# Patient Record
Sex: Male | Born: 1955
Health system: Southern US, Community
[De-identification: ages and names within clinical notes are randomized; demographics above are authoritative.]

## PROBLEM LIST (undated history)

## (undated) DIAGNOSIS — C4491 Basal cell carcinoma of skin, unspecified: Secondary | ICD-10-CM

## (undated) DIAGNOSIS — K2970 Gastritis, unspecified, without bleeding: Secondary | ICD-10-CM

## (undated) DIAGNOSIS — Z933 Colostomy status: Secondary | ICD-10-CM

## (undated) DIAGNOSIS — N200 Calculus of kidney: Secondary | ICD-10-CM

## (undated) DIAGNOSIS — D649 Anemia, unspecified: Secondary | ICD-10-CM

## (undated) DIAGNOSIS — A048 Other specified bacterial intestinal infections: Secondary | ICD-10-CM

## (undated) DIAGNOSIS — I10 Essential (primary) hypertension: Secondary | ICD-10-CM

## (undated) DIAGNOSIS — F410 Panic disorder [episodic paroxysmal anxiety] without agoraphobia: Secondary | ICD-10-CM

## (undated) DIAGNOSIS — T7840XA Allergy, unspecified, initial encounter: Secondary | ICD-10-CM

## (undated) DIAGNOSIS — E785 Hyperlipidemia, unspecified: Secondary | ICD-10-CM

## (undated) DIAGNOSIS — Z86010 Personal history of colonic polyps: Secondary | ICD-10-CM

## (undated) DIAGNOSIS — C20 Malignant neoplasm of rectum: Secondary | ICD-10-CM

## (undated) DIAGNOSIS — C189 Malignant neoplasm of colon, unspecified: Secondary | ICD-10-CM

## (undated) HISTORY — DX: Basal cell carcinoma of skin, unspecified: C44.91

## (undated) HISTORY — DX: Gastritis, unspecified, without bleeding: K29.70

## (undated) HISTORY — DX: Hyperlipidemia, unspecified: E78.5

## (undated) HISTORY — DX: Panic disorder (episodic paroxysmal anxiety): F41.0

## (undated) HISTORY — DX: Calculus of kidney: N20.0

## (undated) HISTORY — DX: Malignant neoplasm of rectum: C20

## (undated) HISTORY — DX: Personal history of colonic polyps: Z86.010

## (undated) HISTORY — PX: TONSILLECTOMY: SUR1361

## (undated) HISTORY — DX: Malignant neoplasm of colon, unspecified: C18.9

## (undated) HISTORY — DX: Essential (primary) hypertension: I10

## (undated) HISTORY — DX: Other specified bacterial intestinal infections: A04.8

## (undated) HISTORY — DX: Anemia, unspecified: D64.9

---

## 2013-08-07 ENCOUNTER — Encounter (INDEPENDENT_AMBULATORY_CARE_PROVIDER_SITE_OTHER): Payer: Self-pay

## 2013-08-07 ENCOUNTER — Ambulatory Visit (INDEPENDENT_AMBULATORY_CARE_PROVIDER_SITE_OTHER): Payer: BC Managed Care – PPO

## 2013-08-07 ENCOUNTER — Ambulatory Visit (INDEPENDENT_AMBULATORY_CARE_PROVIDER_SITE_OTHER): Payer: BC Managed Care – PPO | Admitting: Nurse Practitioner

## 2013-08-07 VITALS — BP 155/97 | HR 77 | Temp 99.2°F | Ht 73.0 in | Wt 200.0 lb

## 2013-08-07 DIAGNOSIS — K5900 Constipation, unspecified: Secondary | ICD-10-CM

## 2013-08-07 DIAGNOSIS — R109 Unspecified abdominal pain: Secondary | ICD-10-CM

## 2013-08-07 DIAGNOSIS — A048 Other specified bacterial intestinal infections: Secondary | ICD-10-CM

## 2013-08-07 HISTORY — DX: Other specified bacterial intestinal infections: A04.8

## 2013-08-07 NOTE — Progress Notes (Signed)
   Subjective:    Patient ID: Austin Robbins, male    DOB: 11-29-1955, 56 y.o.   MRN: [id number]  HPI Patient in c/o abdominal pain- started 1 month ago- decreased appetite- lower back pain- then started getting gassy stomach growling- has to force hisself to eat- got some better for couple of days then started having bloody stools- no black stools- stools contain mucus.    Review of Systems  Constitutional: Positive for appetite change (decrased).  Cardiovascular: Negative.   Gastrointestinal: Positive for abdominal pain and blood in stool. Negative for nausea, diarrhea and constipation.  Genitourinary: Negative.   Hematological: Negative.        Objective:   Physical Exam  Constitutional: He appears well-developed and well-nourished.  Cardiovascular: Normal rate, regular rhythm and normal heart sounds.   Pulmonary/Chest: Effort normal and breath sounds normal.  Abdominal: Soft. Bowel sounds are normal. He exhibits distension. He exhibits no mass. There is no tenderness. There is no rebound and no guarding.     BP 155/97  Pulse 77  Temp(Src) 99.2 F (37.3 C)  Ht 6\' 1"  (1.854 m)  Wt 200 lb (90.719 kg)  BMI 26.39 kg/m2 KUB- large amount of stool in colon- Preliminary reading by Paulene Floor, FNP  [facility]      Assessment & Plan:   1. Abdominal pain of unknown etiology   2. Constipation    MOM an prune juice every 4 hours Increase fibr in diet Labs pending Will talk when all tet results are back  Mary-Margaret Daphine Deutscher, FNP

## 2013-08-08 LAB — CBC WITH DIFFERENTIAL
Basophils Absolute: 0 10*3/uL (ref 0.0–0.2)
Eos: 4 %
HCT: 37 % — ABNORMAL LOW (ref 37.5–51.0)
Immature Granulocytes: 0 %
Lymphs: 23 %
Monocytes: 9 %
Neutrophils Absolute: 7 10*3/uL (ref 1.4–7.0)
Neutrophils Relative %: 64 %
Platelets: 374 10*3/uL (ref 150–379)
RBC: 4.91 x10E6/uL (ref 4.14–5.80)
RDW: 16.3 % — ABNORMAL HIGH (ref 12.3–15.4)
WBC: 10.9 10*3/uL — ABNORMAL HIGH (ref 3.4–10.8)

## 2013-08-09 ENCOUNTER — Other Ambulatory Visit: Payer: Self-pay | Admitting: Nurse Practitioner

## 2013-08-09 LAB — CMP14+EGFR
ALT: 11 IU/L (ref 0–44)
AST: 13 IU/L (ref 0–40)
Albumin/Globulin Ratio: 1.8 (ref 1.1–2.5)
Albumin: 4.3 g/dL (ref 3.5–5.5)
Alkaline Phosphatase: 88 IU/L (ref 39–117)
BUN/Creatinine Ratio: 15 (ref 9–20)
BUN: 14 mg/dL (ref 6–24)
CO2: 24 mmol/L (ref 18–29)
Creatinine, Ser: 0.92 mg/dL (ref 0.76–1.27)
GFR calc Af Amer: 106 mL/min/{1.73_m2} (ref >59)
GFR calc non Af Amer: 92 mL/min/{1.73_m2} (ref >59)
Globulin, Total: 2.4 g/dL (ref 1.5–4.5)
Sodium: 141 mmol/L (ref 134–144)

## 2013-08-09 LAB — LIPASE: Lipase: 41 U/L (ref 0–59)

## 2013-08-09 LAB — H PYLORI, IGM, IGG, IGA AB
H. pylori, IgA Abs: <9 units (ref 0.0–8.9)
H. pylori, IgM Abs: 11.7 units — ABNORMAL HIGH (ref 0.0–8.9)

## 2013-08-09 MED ORDER — AMOXICILL-CLARITHRO-LANSOPRAZ PO MISC: Freq: Two times a day (BID) | ORAL | Status: DC

## 2013-08-11 ENCOUNTER — Telehealth: Payer: Self-pay | Admitting: Nurse Practitioner

## 2013-08-11 DIAGNOSIS — D649 Anemia, unspecified: Secondary | ICD-10-CM

## 2013-08-11 DIAGNOSIS — A048 Other specified bacterial intestinal infections: Secondary | ICD-10-CM

## 2013-08-15 NOTE — Telephone Encounter (Signed)
Message copied by Gwenith Daily on Tue Aug 15, 2013  3:26 PM ------      Message from: Bennie Pierini      Created: Wed Aug 09, 2013  2:04 PM       h pylori- needs treatment- rx called into pharmacy (bacteria in stomach)      hgb low- OTC iron tablets- recheck in 1 month       ------

## 2013-08-15 NOTE — Telephone Encounter (Signed)
Discussed results of xray and labs. Patient reports pink tinged mucus with BMs at times. No black or tarry stools. I will leave a FOBT up front for him to do. He has never had a colonoscopy. Will put in a referral for GI consult and colonoscopy. Patient is ok with this. He will pickup medication to treat H Pylori and will take and OTC iron supplement. He is aware that he should f/u in 1 month for rpt hgb.

## 2013-09-27 ENCOUNTER — Encounter: Payer: Self-pay | Admitting: Family Medicine

## 2013-09-29 ENCOUNTER — Telehealth: Payer: Self-pay

## 2013-09-29 DIAGNOSIS — B9681 Helicobacter pylori [H. pylori] as the cause of diseases classified elsewhere: Secondary | ICD-10-CM

## 2013-09-29 DIAGNOSIS — K2970 Gastritis, unspecified, without bleeding: Principal | ICD-10-CM

## 2013-09-29 NOTE — Telephone Encounter (Signed)
Referral re-done.

## 2013-09-29 NOTE — Addendum Note (Signed)
Addended by: Chevis Pretty on: 09/29/2013 12:18 PM   Modules accepted: Level of Service

## 2013-10-02 ENCOUNTER — Telehealth: Payer: Self-pay

## 2013-10-02 ENCOUNTER — Telehealth: Payer: Self-pay | Admitting: Nurse Practitioner

## 2013-10-02 MED ORDER — TRAMADOL HCL 50 MG PO TABS
50.0000 mg | ORAL_TABLET | Freq: Three times a day (TID) | ORAL | Status: DC | PRN
Start: 2013-10-02 — End: 2013-11-01

## 2013-10-02 NOTE — Telephone Encounter (Signed)
Still really hurting   Need some medicine

## 2013-10-02 NOTE — Telephone Encounter (Signed)
I spoke with patient and he stated he did have 107 fever. He was positive his fever was 107 earlier today and was 98.0 now orally. Stated he came to [facility] earlier today to pickup a RX. Pt. Having chills as well. No abd pain.. Pt stated he had loose stools and had been waiting for referral to GI. Advised pt 2 GI referrals had been place previously. I placed pt on hold and spoke with MMM about patient. Per MMM go to ER now. Pt stated he didn't wont to go to ER. Pt was given appt for tom am with MMM but was advised again to go to ER because his temperature was to high and dangerous to wait for am apt.. This was witnessed by Ronnald Collum and Lynnea Ferrier. Pt refused to go to ER. Will rck pt in am in office.

## 2013-10-02 NOTE — Telephone Encounter (Signed)
Yes was done 09/29/13- i will have it checked on

## 2013-10-02 NOTE — Telephone Encounter (Signed)
Pt notified that RX was at front ready for pick up per MMM note

## 2013-10-02 NOTE — Telephone Encounter (Signed)
Austin Robbins is working on referral but patient said in a lot of pain and wants some medicine

## 2013-10-02 NOTE — Telephone Encounter (Signed)
Pain rx ready for pick up  

## 2013-10-03 ENCOUNTER — Ambulatory Visit (INDEPENDENT_AMBULATORY_CARE_PROVIDER_SITE_OTHER): Payer: 59 | Admitting: Nurse Practitioner

## 2013-10-03 ENCOUNTER — Telehealth: Payer: Self-pay | Admitting: Internal Medicine

## 2013-10-03 ENCOUNTER — Encounter: Payer: Self-pay | Admitting: Nurse Practitioner

## 2013-10-03 VITALS — BP 91/64 | HR 81 | Temp 97.9°F | Ht 73.0 in | Wt 215.0 lb

## 2013-10-03 DIAGNOSIS — R197 Diarrhea, unspecified: Secondary | ICD-10-CM

## 2013-10-03 DIAGNOSIS — R509 Fever, unspecified: Secondary | ICD-10-CM

## 2013-10-03 DIAGNOSIS — N410 Acute prostatitis: Secondary | ICD-10-CM

## 2013-10-03 DIAGNOSIS — F410 Panic disorder [episodic paroxysmal anxiety] without agoraphobia: Secondary | ICD-10-CM

## 2013-10-03 LAB — POCT URINALYSIS DIPSTICK
Bilirubin, UA: NEGATIVE
Blood, UA: NEGATIVE
Glucose, UA: NEGATIVE
KETONES UA: NEGATIVE
LEUKOCYTES UA: NEGATIVE
NITRITE UA: NEGATIVE
PH UA: 5
Spec Grav, UA: 1.025
UROBILINOGEN UA: NEGATIVE

## 2013-10-03 LAB — POCT CBC
Granulocyte percent: 0.8 %G — AB (ref 37–80)
HCT, POC: 31.9 % — AB (ref 43.5–53.7)
Hemoglobin: 9.9 g/dL — AB (ref 14.1–18.1)
LYMPH, POC: 0.8 (ref 0.6–3.4)
MCH, POC: 22.3 pg — AB (ref 27–31.2)
MCHC: 31 g/dL — AB (ref 31.8–35.4)
MCV: 72 fL — AB (ref 80–97)
MPV: 8.2 fL (ref 0–99.8)
POC GRANULOCYTE: 13.3 — AB (ref 2–6.9)
POC LYMPH %: 5.2 % — AB (ref 10–50)
Platelet Count, POC: 227 10*3/uL (ref 142–424)
RBC: 4.4 M/uL — AB (ref 4.69–6.13)
RDW, POC: 17.3 %
WBC: 14.5 10*3/uL — AB (ref 4.6–10.2)

## 2013-10-03 LAB — POCT UA - MICROSCOPIC ONLY
Bacteria, U Microscopic: NEGATIVE
CASTS, UR, LPF, POC: NEGATIVE
CRYSTALS, UR, HPF, POC: NEGATIVE
Epithelial cells, urine per micros: NEGATIVE
RBC, urine, microscopic: NEGATIVE
YEAST UA: NEGATIVE

## 2013-10-03 MED ORDER — ALPRAZOLAM 0.25 MG PO TABS: 0.2500 mg | ORAL_TABLET | Freq: Two times a day (BID) | ORAL | Status: DC | PRN

## 2013-10-03 MED ORDER — SULFAMETHOXAZOLE-TMP DS 800-160 MG PO TABS: 1.0000 | ORAL_TABLET | Freq: Two times a day (BID) | ORAL | Status: DC

## 2013-10-03 NOTE — Progress Notes (Signed)
Subjective:    Patient ID: Austin Robbins, male    DOB: 1955/10/18, 58 y.o.   MRN: [id number]  HPI  Patient in today saying that he is still having abdominal pain. Has spells that come on all the suddenly- says he shakes real bad and becomes SOB but no addominal pain. Happens intermittently but occurred 2X yesterday and lasts about 30 minutes says taht his temperature has been fluctuating. He was seen earlier in December with diarrhea and abdominal pain and was diagnosed Hpylori an dwas treated-. Since then his stools look like dark water or pudding and he says that they float. * not voiding as much as he usually does and occasionally has trouble getting stream started.   Review of Systems  Constitutional: Positive for fever (?). Negative for chills and appetite change.  Respiratory: Negative.   Cardiovascular: Negative.   Gastrointestinal: Positive for diarrhea.  Genitourinary: Negative.   Neurological: Negative.   All other systems reviewed and are negative.       Objective:   Physical Exam  Constitutional: He is oriented to person, place, and time. He appears well-developed and well-nourished.  Neck: Normal range of motion. Neck supple.  Cardiovascular: Normal rate, regular rhythm and normal heart sounds.   Pulmonary/Chest: Effort normal and breath sounds normal.  Abdominal: Soft. Bowel sounds are normal. He exhibits no distension. There is no tenderness. There is no rebound.  Genitourinary:  No rectal exam today  Lymphadenopathy:    He has no cervical adenopathy.  Neurological: He is alert and oriented to person, place, and time.  Skin: Skin is warm.  Psychiatric: He has a normal mood and affect. His behavior is normal. Judgment and thought content normal.   BP 91/64  Pulse 81  Temp(Src) 97.9 F (36.6 C) (Oral)  Ht 6\' 1"  (1.854 m)  Wt 215 lb (97.523 kg)  BMI 28.37 kg/m2  Results for orders placed in visit on 10/03/13  POCT CBC      Result Value Range   WBC 14.5  (*) 4.6 - 10.2 K/uL   Lymph, poc 0.8  0.6 - 3.4   POC LYMPH PERCENT 5.2 (*) 10 - 50 %L   MID (cbc)    0 - 0.9   POC MID %    0 - 12 %M   POC Granulocyte 13.3 (*) 2 - 6.9   Granulocyte percent 0.8 (*) 37 - 80 %G   RBC 4.4 (*) 4.69 - 6.13 M/uL   Hemoglobin 9.9 (*) 14.1 - 18.1 g/dL   HCT, POC 31.9 (*) 43.5 - 53.7 %   MCV 72.0 (*) 80 - 97 fL   MCH, POC 22.3 (*) 27 - 31.2 pg   MCHC 31.0 (*) 31.8 - 35.4 g/dL   RDW, POC 17.3     Platelet Count, POC 227.0  142 - 424 K/uL   MPV 8.2  0 - 99.8 fL  POCT UA - MICROSCOPIC ONLY      Result Value Range   WBC, Ur, HPF, POC 5-10     RBC, urine, microscopic neg     Bacteria, U Microscopic neg     Mucus, UA occ     Epithelial cells, urine per micros neg w/ sperm present     Crystals, Ur, HPF, POC neg     Casts, Ur, LPF, POC neg     Yeast, UA neg         Assessment & Plan:   1. Diarrhea   2. Panic attacks  3. Fever, unspecified   4. Prostatitis, acute    Meds ordered this encounter  Medications  . ALPRAZolam (XANAX) 0.25 MG tablet    Sig: Take 1 tablet (0.25 mg total) by mouth 2 (two) times daily as needed for anxiety.    Dispense:  60 tablet    Refill:  0    Order Specific Question:  Supervising Provider    Answer:  Chipper Herb [[id number]]  . sulfamethoxazole-trimethoprim (BACTRIM DS) 800-160 MG per tablet    Sig: Take 1 tablet by mouth 2 (two) times daily.    Dispense:  60 tablet    Refill:  0    Order Specific Question:  Supervising Provider    Answer:  Joycelyn Man   Gi referral pending Force fluids Rest  Make sure complete antibiotic as rx RTO prn  Mary-Margaret Hassell Done, FNP

## 2013-10-03 NOTE — Patient Instructions (Addendum)
Panic Attacks Panic attacks are sudden, short-livedsurges of severe anxiety, fear, or discomfort. They may occur for no reason when you are relaxed, when you are anxious, or when you are sleeping. Panic attacks may occur for a number of reasons:   Healthy people occasionally have panic attacks in extreme, life-threatening situations, such as war or natural disasters. Normal anxiety is a protective mechanism of the body that helps [place] react to danger (fight or flight response).  Panic attacks are often seen with anxiety disorders, such as panic disorder, social anxiety disorder, generalized anxiety disorder, and phobias. Anxiety disorders cause excessive or uncontrollable anxiety. They may interfere with your relationships or other life activities.  Panic attacks are sometimes seen with other mental illnesses such as depression and posttraumatic stress disorder.  Certain medical conditions, prescription medicines, and drugs of abuse can cause panic attacks. SYMPTOMS  Panic attacks start suddenly, peak within 20 minutes, and are accompanied by four or more of the following symptoms:  Pounding heart or fast heart rate (palpitations).  Sweating.  Trembling or shaking.  Shortness of breath or feeling smothered.  Feeling choked.  Chest pain or discomfort.  Nausea or strange feeling in your stomach.  Dizziness, lightheadedness, or feeling like you will faint.  Chills or hot flushes.  Numbness or tingling in your lips or hands and feet. Prostatitis The prostate gland is about the size and shape of a walnut. It is located just below your bladder. It produces one of the components of semen, which is made up of sperm and the fluids that help nourish and transport it out from the testicles. Prostatitis is inflammation of the prostate gland.  There are four types of prostatitis: Acute bacterial prostatitis This is the least common type of prostatitis. It starts quickly and usually is associated  with a bladder infection, high fever, and shaking chills. It can occur at any age. Chronic bacterial prostatitis This is a persistent bacterial infection in the prostate. It usually develops from repeated acute bacterial prostatitis or acute bacterial prostatitis that was not properly treated. It can occur in men of any age but is most common in middle-aged men whose prostate has begun to enlarge. The symptoms are not as severe as those in acute bacterial prostatitis. Discomfort in the part of your body that is in front of your rectum and below your scrotum (perineum), lower abdomen, or in the head of your penis (glans) may represent your primary discomfort. Chronic prostatitis (nonbacterial) This is the most common type of prostatitis. It is inflammation of the prostate gland that is not caused by a bacterial infection. The cause is unknown and may be associated with a viral infection or autoimmune disorder. Prostatodynia (pelvic floor disorder) This is associated with increased muscular tone in the pelvis surrounding the prostate. CAUSES The causes of bacterial prostatitis are bacterial infection. The causes of the other types of prostatitis are unknown.  SYMPTOMS  Symptoms can vary depending upon the type of prostatitis that exists. There can also be overlap in symptoms. Possible symptoms for each type of prostatitis are listed below. Acute Bacterial Prostatitis Painful urination. Fever or chills. Muscle or joint pains. Low back pain. Low abdominal pain. Inability to empty bladder completely. Chronic Bacterial Prostatitis, Chronic Nonbacterial Prostatitis, and Prostatodynia Sudden urge to urinate. Frequent urination. Difficulty starting urine stream. Weak urine stream. Discharge from the urethra. Dribbling after urination. Rectal pain. Pain in the testicles, penis, or tip of the penis. Pain in the perineum. Problems with sexual function.  Painful ejaculation. Bloody semen. DIAGNOSIS   In order to diagnose prostatitis, your health care provider will ask about your symptoms. One or more urine samples will be taken and tested (urinalysis). If the urinalysis result is negative for bacteria, your health care provider may use a finger to feel your prostate (digital rectal exam). This exam helps your health care provider determine if your prostate is swollen and tender. It will also produce a specimen of semen that can be analyzed. TREATMENT  Treatment for prostatitis depends on the cause. If a bacterial infection is the cause, it can be treated with antibiotic medicine. In cases of chronic bacterial prostatitis, the use of antibiotics for up to 1 month or 6 weeks may be necessary. Your health care provider may instruct you to take sitz baths to help relieve pain. A sitz bath is a bath of hot water in which your hips and buttocks are under water. This relaxes the pelvic floor muscles and often helps to relieve the pressure on your prostate. HOME CARE INSTRUCTIONS  Take all medicines as directed by your health care provider. Take sitz baths as directed by your health care provider. SEEK MEDICAL CARE IF:  Your symptoms get worse, not better. You have a fever. SEEK IMMEDIATE MEDICAL CARE IF:  You have chills. You feel nauseous or vomit. You feel lightheaded or faint. You are unable to urinate. You have blood or blood clots in your urine. Document Released: 08/21/2000 Document Revised: 06/14/2013 Document Reviewed: 03/13/2013 [facility] Patient Information 2014 [place].

## 2013-10-03 NOTE — Telephone Encounter (Signed)
Patient is scheduled for 10/06/13 2:15 with Dr. Carlean Purl

## 2013-10-04 ENCOUNTER — Telehealth: Payer: Self-pay | Admitting: Nurse Practitioner

## 2013-10-04 LAB — CMP14+EGFR
ALT: 19 IU/L (ref 0–44)
AST: 26 IU/L (ref 0–40)
Albumin/Globulin Ratio: 1.1 (ref 1.1–2.5)
Albumin: 3.3 g/dL — ABNORMAL LOW (ref 3.5–5.5)
Alkaline Phosphatase: 133 IU/L — ABNORMAL HIGH (ref 39–117)
BILIRUBIN TOTAL: 0.6 mg/dL (ref 0.0–1.2)
BUN/Creatinine Ratio: 20 (ref 9–20)
BUN: 24 mg/dL (ref 6–24)
CALCIUM: 8.6 mg/dL — AB (ref 8.7–10.2)
CHLORIDE: 99 mmol/L (ref 97–108)
CO2: 20 mmol/L (ref 18–29)
Creatinine, Ser: 1.21 mg/dL (ref 0.76–1.27)
GFR calc non Af Amer: 66 mL/min/{1.73_m2} (ref >59)
GFR, EST AFRICAN AMERICAN: 76 mL/min/{1.73_m2} (ref >59)
Globulin, Total: 3 g/dL (ref 1.5–4.5)
Glucose: 107 mg/dL — ABNORMAL HIGH (ref 65–99)
POTASSIUM: 4.5 mmol/L (ref 3.5–5.2)
Sodium: 137 mmol/L (ref 134–144)
Total Protein: 6.3 g/dL (ref 6.0–8.5)

## 2013-10-04 LAB — LIPASE: Lipase: 14 U/L (ref 0–59)

## 2013-10-04 LAB — AMYLASE: Amylase: 37 U/L (ref 31–124)

## 2013-10-04 NOTE — Telephone Encounter (Signed)
This has been done earlier this morning

## 2013-10-06 ENCOUNTER — Ambulatory Visit (INDEPENDENT_AMBULATORY_CARE_PROVIDER_SITE_OTHER): Payer: 59 | Admitting: Internal Medicine

## 2013-10-06 ENCOUNTER — Encounter: Payer: Self-pay | Admitting: Internal Medicine

## 2013-10-06 VITALS — BP 108/64 | HR 76 | Temp 98.7°F | Ht 72.5 in | Wt 215.4 lb

## 2013-10-06 DIAGNOSIS — R198 Other specified symptoms and signs involving the digestive system and abdomen: Secondary | ICD-10-CM

## 2013-10-06 DIAGNOSIS — R194 Change in bowel habit: Secondary | ICD-10-CM

## 2013-10-06 DIAGNOSIS — R195 Other fecal abnormalities: Secondary | ICD-10-CM

## 2013-10-06 MED ORDER — NA SULFATE-K SULFATE-MG SULF 17.5-3.13-1.6 GM/177ML PO SOLN: ORAL | Status: DC

## 2013-10-06 NOTE — Progress Notes (Signed)
Subjective:    Patient ID: Austin Robbins, male    DOB: 31-Jan-1956, 58 y.o.   MRN: [id number]  HPI Patient is a very nice married man here with a several month history of loose stools. He says in September of last year he noticed borborygmi rumbling lobe of bloating and some loose stools. He saw Chevis Pretty NP and she evaluated in intensive for H. pylori with positive serologies he was treated for that. Bowel movements improved a little bit. He was having lower abdominal and more groin pain that was burning and rather persistent. He had fevers. In January of this year he was evaluated, within the past week or so in found to have findings consistent with prostatitis and started on Septra DS. Over the past 3 days he is starting to feel better. He still have intermittent fevers. The groin pain is much better. He continues to have 1-2 loose to watery bowel movements daily. He says that the fecal material but does come out used to flow to starting this and began since starting the Septra. He denies any aren't or early stools. He does admit to regular use of 12-18 beers a week up until  the last month. He stops drinking because he lost his taste for it. He has never been told he had hepatitis, cirrhosis or pancreatitis. The onset of symptoms did not coincide when the new medications travel or sick contacts. He claims some weight loss. No clearrectal bleeding but some pinkish dc with flatus.  Wt Readings from Last 3 Encounters:  10/06/13 215 lb 6 oz (97.693 kg)  10/03/13 215 lb (97.523 kg)  08/07/13 200 lb (90.719 kg)   GI review of systems is otherwise negative Allergies  Allergen Reactions  . Codeine Other (See Comments)    Chest pain   Outpatient Prescriptions Prior to Visit  Medication Sig Dispense Refill  . ALPRAZolam (XANAX) 0.25 MG tablet Take 1 tablet (0.25 mg total) by mouth 2 (two) times daily as needed for anxiety.  60 tablet  0  . sulfamethoxazole-trimethoprim (BACTRIM  DS) 800-160 MG per tablet Take 1 tablet by mouth 2 (two) times daily.  60 tablet  0  . traMADol (ULTRAM) 50 MG tablet Take 1 tablet (50 mg total) by mouth every 8 (eight) hours as needed.  30 tablet  0   No facility-administered medications prior to visit.   Past Medical History  Diagnosis Date  . H. pylori infection 08/07/2013  . Anemia   . Gastritis   . Panic attacks   . Prostatitis 10/03/2013    acute   Past Surgical History  Procedure Laterality Date  . Tonsillectomy     History   Social History  . Marital Status: Married    Spouse Name: N/A    Number of Children: 2  . Years of Education: N/A   Occupational History  . Architect    Social History Main Topics  . Smoking status: Never Smoker   . Smokeless tobacco: Never Used  . Alcohol Use: 7.2 oz/week    12 Cans of beer per week  . Drug Use: No  . Sexual Activity: None   Other Topics Concern  . 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 + children  Never smoker. 2 caffeine drinks per day. Prior regular fairly heavy alcohol until recently as of 10/06/2013.         Family History  Problem Relation Age of Onset  . Healthy Mother   . Lung cancer Father     smoker        Review of Systems Positive for those things mentioned in the history of present illness some dyspnea area and all other review of systems negative.    Objective:   Physical Exam General:  Well-developed, well-nourished and in no acute distress Eyes:  anicteric. ENT:   Mouth and posterior pharynx free of lesions.  Neck:   supple w/o thyromegaly or mass.  Lungs: Clear to auscultation bilaterally. Heart:  S1S2, no rubs, murmurs, gallops. Abdomen:  soft, non-tender, no hepatosplenomegaly, hernia, or mass and BS+.  Rectal: deferred Lymph:  no cervical or supraclavicular adenopathy. Extremities:   no edema Skin   no rash. Neuro:  A&O x 3.  Psych:  appropriate mood and  Affect.   Data  Reviewed:  PCP notes Labs All in EMR Lab Results  Component Value Date   WBC 14.5* 10/03/2013   HGB 9.9* 10/03/2013   HCT 31.9* 10/03/2013   MCV 72.0* 10/03/2013   PLT 374 08/07/2013         Assessment & Plan:   1. Change in bowel habits   2. Loose stools   3. Microcytic anemia  I'm not sure what the cause of his bowel symptoms are. But since she's never had a colonoscopy it makes sense to evaluate. Colorectal neoplasia, inflammatory bowel disease or possible. Given the chronicity I think infection is less likely. I think is infectious symptoms are from his known prostatitis. His abdominal exam was a very benign today.  Microcytic anemia c/w iron deficiency needs colonoscopy also. If this is negative then will ned EGD I suspect. Celiac testing could be considered.  Will schedule him for a colonoscopy. The risks and benefits as well as alternatives of endoscopic procedure(s) have been discussed and reviewed. All questions answered. The patient agrees to proceed.  Further plans pending the colonoscopy results. One consideration would be chronic pancreatitis but he doesn't have any calcifications seen on the plain film of the abdomen, and I suspect he does not have this based upon the history. He was a previous heavy user of alcohol though.  I appreciate the opportunity to care for this patient.   Cc: Chevis Pretty, NP

## 2013-10-06 NOTE — Patient Instructions (Signed)

## 2013-10-10 ENCOUNTER — Encounter: Payer: Self-pay | Admitting: Internal Medicine

## 2013-10-13 ENCOUNTER — Telehealth: Payer: Self-pay | Admitting: Nurse Practitioner

## 2013-10-13 NOTE — Telephone Encounter (Signed)
Patient states that he is still having fevers and chills after 10 days and still having anxiety attacks usually when his fever if between 99 and 100. Do you think he needs different meds?

## 2013-10-14 NOTE — Telephone Encounter (Signed)
porbably viral if antibiotic did not help- RTO mondya if other symptoms develop or feels worse.

## 2013-10-16 ENCOUNTER — Emergency Department (HOSPITAL_COMMUNITY): Payer: 59

## 2013-10-16 ENCOUNTER — Encounter: Payer: Self-pay | Admitting: Family Medicine

## 2013-10-16 ENCOUNTER — Emergency Department (HOSPITAL_COMMUNITY)
Admission: EM | Admit: 2013-10-16 | Discharge: 2013-10-16 | Disposition: A | Discharge status: Home / Self Care | Payer: 59 | Attending: Emergency Medicine | Admitting: Emergency Medicine

## 2013-10-16 ENCOUNTER — Ambulatory Visit (INDEPENDENT_AMBULATORY_CARE_PROVIDER_SITE_OTHER): Payer: 59 | Admitting: Family Medicine

## 2013-10-16 ENCOUNTER — Encounter (HOSPITAL_COMMUNITY): Payer: Self-pay | Admitting: Emergency Medicine

## 2013-10-16 VITALS — BP 113/68 | HR 84 | Temp 101.2°F | Ht 73.0 in | Wt 208.0 lb

## 2013-10-16 DIAGNOSIS — R509 Fever, unspecified: Secondary | ICD-10-CM

## 2013-10-16 DIAGNOSIS — Z8719 Personal history of other diseases of the digestive system: Secondary | ICD-10-CM | POA: Diagnosis not present

## 2013-10-16 DIAGNOSIS — D72829 Elevated white blood cell count, unspecified: Secondary | ICD-10-CM

## 2013-10-16 DIAGNOSIS — Z792 Long term (current) use of antibiotics: Secondary | ICD-10-CM | POA: Diagnosis not present

## 2013-10-16 DIAGNOSIS — F410 Panic disorder [episodic paroxysmal anxiety] without agoraphobia: Secondary | ICD-10-CM | POA: Diagnosis not present

## 2013-10-16 DIAGNOSIS — Z862 Personal history of diseases of the blood and blood-forming organs and certain disorders involving the immune mechanism: Secondary | ICD-10-CM | POA: Insufficient documentation

## 2013-10-16 DIAGNOSIS — Z87448 Personal history of other diseases of urinary system: Secondary | ICD-10-CM | POA: Diagnosis not present

## 2013-10-16 DIAGNOSIS — D649 Anemia, unspecified: Secondary | ICD-10-CM | POA: Insufficient documentation

## 2013-10-16 DIAGNOSIS — K625 Hemorrhage of anus and rectum: Secondary | ICD-10-CM

## 2013-10-16 LAB — URINALYSIS, ROUTINE W REFLEX MICROSCOPIC
Bilirubin Urine: NEGATIVE
Glucose, UA: NEGATIVE mg/dL
HGB URINE DIPSTICK: NEGATIVE
Ketones, ur: NEGATIVE mg/dL
LEUKOCYTES UA: NEGATIVE
Nitrite: NEGATIVE
PH: 6 (ref 5.0–8.0)
Protein, ur: NEGATIVE mg/dL
Specific Gravity, Urine: <1.005 — ABNORMAL LOW (ref 1.005–1.030)
Urobilinogen, UA: 0.2 mg/dL (ref 0.0–1.0)

## 2013-10-16 LAB — CBC WITH DIFFERENTIAL/PLATELET
Basophils Absolute: 0 10*3/uL (ref 0.0–0.1)
Basophils Relative: 0 % (ref 0–1)
EOS ABS: 0.2 10*3/uL (ref 0.0–0.7)
Eosinophils Relative: 2 % (ref 0–5)
HCT: 28.8 % — ABNORMAL LOW (ref 39.0–52.0)
HEMOGLOBIN: 9 g/dL — AB (ref 13.0–17.0)
LYMPHS ABS: 1.6 10*3/uL (ref 0.7–4.0)
LYMPHS PCT: 16 % (ref 12–46)
MCH: 22.8 pg — ABNORMAL LOW (ref 26.0–34.0)
MCHC: 31.3 g/dL (ref 30.0–36.0)
MCV: 73.1 fL — ABNORMAL LOW (ref 78.0–100.0)
Monocytes Absolute: 0.8 10*3/uL (ref 0.1–1.0)
Monocytes Relative: 8 % (ref 3–12)
NEUTROS PCT: 75 % (ref 43–77)
Neutro Abs: 7.4 10*3/uL (ref 1.7–7.7)
Platelets: 336 10*3/uL (ref 150–400)
RBC: 3.94 MIL/uL — AB (ref 4.22–5.81)
RDW: 16.8 % — ABNORMAL HIGH (ref 11.5–15.5)
WBC: 10 10*3/uL (ref 4.0–10.5)

## 2013-10-16 LAB — POCT CBC
Granulocyte percent: 79.4 %G (ref 37–80)
HCT, POC: 28.6 % — AB (ref 43.5–53.7)
Hemoglobin: 8.8 g/dL — AB (ref 14.1–18.1)
Lymph, poc: 2.1 (ref 0.6–3.4)
MCH, POC: 21.9 pg — AB (ref 27–31.2)
MCHC: 30.8 g/dL — AB (ref 31.8–35.4)
MCV: 71.1 fL — AB (ref 80–97)
MPV: 7.3 fL (ref 0–99.8)
POC Granulocyte: 9.4 — AB (ref 2–6.9)
POC LYMPH PERCENT: 17.8 %L (ref 10–50)
Platelet Count, POC: 343 10*3/uL (ref 142–424)
RBC: 4 M/uL — AB (ref 4.69–6.13)
RDW, POC: 17.7 %
WBC: 11.8 10*3/uL — AB (ref 4.6–10.2)

## 2013-10-16 LAB — COMPREHENSIVE METABOLIC PANEL
ALT: 12 U/L (ref 0–53)
AST: 13 U/L (ref 0–37)
Albumin: 2.6 g/dL — ABNORMAL LOW (ref 3.5–5.2)
Alkaline Phosphatase: 95 U/L (ref 39–117)
BILIRUBIN TOTAL: 0.4 mg/dL (ref 0.3–1.2)
BUN: 12 mg/dL (ref 6–23)
CHLORIDE: 94 meq/L — AB (ref 96–112)
CO2: 26 meq/L (ref 19–32)
Calcium: 8.9 mg/dL (ref 8.4–10.5)
Creatinine, Ser: 1.16 mg/dL (ref 0.50–1.35)
GFR calc non Af Amer: 68 mL/min — ABNORMAL LOW (ref >90)
GFR, EST AFRICAN AMERICAN: 79 mL/min — AB (ref >90)
GLUCOSE: 116 mg/dL — AB (ref 70–99)
POTASSIUM: 5.1 meq/L (ref 3.7–5.3)
SODIUM: 131 meq/L — AB (ref 137–147)
TOTAL PROTEIN: 8 g/dL (ref 6.0–8.3)

## 2013-10-16 MED ORDER — ACETAMINOPHEN 325 MG PO TABS
ORAL_TABLET | ORAL | Status: AC
  Filled 2013-10-16: qty 2

## 2013-10-16 MED ORDER — SODIUM CHLORIDE 0.9 % IV BOLUS (SEPSIS)
1000.0000 mL | Freq: Once | INTRAVENOUS | Status: AC
  Administered 2013-10-16: 1000 mL via INTRAVENOUS

## 2013-10-16 MED ORDER — ACETAMINOPHEN 650 MG RE SUPP
650.0000 mg | Freq: Once | RECTAL | Status: DC
Start: 2013-10-16 — End: 2013-10-16

## 2013-10-16 MED ORDER — ACETAMINOPHEN 325 MG PO TABS
650.0000 mg | ORAL_TABLET | Freq: Once | ORAL | Status: AC
  Administered 2013-10-16: 650 mg via ORAL

## 2013-10-16 NOTE — ED Provider Notes (Signed)
CSN: [id number]     Arrival date & time 10/16/13  1619 History   First MD Initiated Contact with Patient 10/16/13 1825     Chief Complaint  Patient presents with  . Fever  . Anemia      Patient is a 58 y.o. male presenting with fever and anemia. The history is provided by the patient and a relative.  Fever Severity:  Moderate Onset quality:  Gradual Duration: 3 weeks. Timing:  Constant Progression:  Worsening Chronicity:  New Relieved by:  None tried Worsened by:  Nothing tried Associated symptoms: chills and cough   Associated symptoms: no chest pain, no confusion, no diarrhea, no dysuria, no headaches, no rash, no sore throat and no vomiting   Risk factors: no recent travel   Anemia Pertinent negatives include no chest pain and no headaches.  pt report fever for 3 weeks. He reports occasional cough No HA No rash No travel No recent vomiting/diarrhea No dysuria or urinary frequency  He has been seen by PCP who sent patient for further evaluation  Pt had been seen in January, treated for [facility], and then later when fever developed he was treated for prostatitis  Past Medical History  Diagnosis Date  . H. pylori infection 08/07/2013  . Anemia   . Gastritis   . Panic attacks   . Prostatitis 10/03/2013    acute   Past Surgical History  Procedure Laterality Date  . Tonsillectomy     Family History  Problem Relation Age of Onset  . Healthy Mother   . Lung cancer Father     smoker   History  Substance Use Topics  . Smoking status: Never Smoker   . Smokeless tobacco: Never Used  . Alcohol Use: No     Comment: no Alcohol since September    Review of Systems  Constitutional: Positive for fever, chills, fatigue and unexpected weight change.       15 lb wt loss since December   HENT: Negative for sore throat.   Respiratory: Positive for cough.   Cardiovascular: Negative for chest pain.  Gastrointestinal: Negative for vomiting, diarrhea and blood in stool.   Genitourinary: Negative for dysuria, difficulty urinating and testicular pain.  Musculoskeletal: Negative for arthralgias, back pain, joint swelling and neck stiffness.  Skin: Negative for rash.  Neurological: Negative for weakness and headaches.  Psychiatric/Behavioral: Negative for confusion.  All other systems reviewed and are negative.      Allergies  Codeine  Home Medications   Current Outpatient Rx  Name  Route  Sig  Dispense  Refill  . acetaminophen (TYLENOL) 500 MG tablet   Oral   Take 1,000 mg by mouth every 6 (six) hours as needed.         . ALPRAZolam (XANAX) 0.25 MG tablet   Oral   Take 1 tablet (0.25 mg total) by mouth 2 (two) times daily as needed for anxiety.   60 tablet   0   . ibuprofen (ADVIL,MOTRIN) 200 MG tablet   Oral   Take 200 mg by mouth every 6 (six) hours as needed.         . sulfamethoxazole-trimethoprim (BACTRIM DS) 800-160 MG per tablet   Oral   Take 1 tablet by mouth 2 (two) times daily.   60 tablet   0   . Na Sulfate-K Sulfate-Mg Sulf (SUPREP BOWEL PREP) SOLN      Use as directed   2 Bottle   0   . traMADol (ULTRAM)  50 MG tablet   Oral   Take 1 tablet (50 mg total) by mouth every 8 (eight) hours as needed.   30 tablet   0    BP 113/58  Pulse 95  Temp(Src) 102.8 F (39.3 C) (Oral)  Resp 22  Ht 6' (1.829 m)  Wt 208 lb (94.348 kg)  BMI 28.20 kg/m2  SpO2 99% Physical Exam CONSTITUTIONAL: Well developed/well nourished HEAD: Normocephalic/atraumatic EYES: EOMI/PERRL ENMT: Mucous membranes moist, uvula midline, no erythema to pharynx NECK: supple no meningeal signs SPINE:entire spine nontender CV: S1/S2 noted, no murmurs/rubs/gallops noted LUNGS: Lungs are clear to auscultation bilaterally, no apparent distress ABDOMEN: soft, nontender, no rebound or guarding GU:no cva tenderness NEURO: Pt is awake/alert, moves all extremitiesx4 EXTREMITIES: pulses normal, full ROM SKIN: warm, color normal, no rash is noted PSYCH:  no abnormalities of mood noted  ED Course  Procedures (including critical care time)  7:11 PM Pt reports fever for 3 weeks but it is important to note he has been seen by [facility] and PCP in that time frame and was afebrile He is scheduled to have c-scope by GI this month and this anemia has been trending down I don't think this is acute GI bleed.    For fever, he reports mild cough but no other symptoms.  He has recently been treated for prostatitis and he had full exam including rectal at PCP today that was unremarkable except for hemoccult positive.  He does not appear immune suppressed. Low risk for HIV (discussed this in private with patient).  He denies IVDA   No recent travel.  No rash or tick bites reported. He has no signs of meningitis   9:26 PM Pt improved He is afebrile.  He is not toxic appearing and not septic appearing He denies any current pain complaints Urine/blood cultures pending, but at this time point would not empirically treat patient He is now completing course of bactrim for prostatitis which he feels is improving We discussed strict return precautions Advised close PCP followup  Labs Review Labs Reviewed  CBC WITH DIFFERENTIAL - Abnormal; Notable for the following:    RBC 3.94 (*)    Hemoglobin 9.0 (*)    HCT 28.8 (*)    MCV 73.1 (*)    MCH 22.8 (*)    RDW 16.8 (*)    All other components within normal limits  COMPREHENSIVE METABOLIC PANEL - Abnormal; Notable for the following:    Sodium 131 (*)    Chloride 94 (*)    Glucose, Bld 116 (*)    Albumin 2.6 (*)    GFR calc non Af Amer 68 (*)    GFR calc Af Amer 79 (*)    All other components within normal limits  CULTURE, BLOOD (ROUTINE X 2)  CULTURE, BLOOD (ROUTINE X 2)  URINE CULTURE  URINALYSIS, ROUTINE W REFLEX MICROSCOPIC   Imaging Review No results found.  EKG Interpretation   None       MDM   Final diagnoses:  None    Nursing notes including past medical history and social  history reviewed and considered in documentation Labs/vital reviewed and considered xrays reviewed and considered Previous records reviewed and considered     Sharyon Cable, MD 10/16/13 2127

## 2013-10-16 NOTE — Discharge Instructions (Signed)
Fever, Adult A fever is a higher than normal body temperature. In an adult, an oral temperature around 98.6 F (37 C) is considered normal. A temperature of 100.4 F (38 C) or higher is generally considered a fever. Mild or moderate fevers generally have no long-term effects and often do not require treatment. Extreme fever (greater than or equal to 106 F or 41.1 C) can cause seizures. The sweating that may occur with repeated or prolonged fever may cause dehydration. Elderly people can develop confusion during a fever. A measured temperature can vary with:  Age.  Time of day.  Method of measurement (mouth, underarm, rectal, or ear). The fever is confirmed by taking a temperature with a thermometer. Temperatures can be taken different ways. Some methods are accurate and some are not.  An oral temperature is used most commonly. Electronic thermometers are fast and accurate.  An ear temperature will only be accurate if the thermometer is positioned as recommended by the manufacturer.  A rectal temperature is accurate and done for those adults who have a condition where an oral temperature cannot be taken.  An underarm (axillary) temperature is not accurate and not recommended. Fever is a symptom, not a disease.  CAUSES   Infections commonly cause fever.  Some noninfectious causes for fever include:  Some arthritis conditions.  Some thyroid or adrenal gland conditions.  Some immune system conditions.  Some types of cancer.  A medicine reaction.  High doses of certain street drugs such as methamphetamine.  Dehydration.  Exposure to high outside or room temperatures.  Occasionally, the source of a fever cannot be determined. This is sometimes called a "fever of unknown origin" (FUO).  Some situations may lead to a temporary rise in body temperature that may go away on its own. Examples are:  Childbirth.  Surgery.  Intense exercise. HOME CARE INSTRUCTIONS   Take  appropriate medicines for fever. Follow dosing instructions carefully. If you use acetaminophen to reduce the fever, be careful to avoid taking other medicines that also contain acetaminophen. Do not take aspirin for a fever if you are younger than age 59. There is an association with Reye's syndrome. Reye's syndrome is a rare but potentially deadly disease.  If an infection is present and antibiotics have been prescribed, take them as directed. Finish them even if you start to feel better.  Rest as needed.  Maintain an adequate fluid intake. To prevent dehydration during an illness with prolonged or recurrent fever, you may need to drink extra fluid.Drink enough fluids to keep your urine clear or pale yellow.  Sponging or bathing with room temperature water may help reduce body temperature. Do not use ice water or alcohol sponge baths.  Dress comfortably, but do not over-bundle. SEEK MEDICAL CARE IF:   You are unable to keep fluids down.  You develop vomiting or diarrhea.  You are not feeling at least partly better after 3 days.  You develop new symptoms or problems. SEEK IMMEDIATE MEDICAL CARE IF:   You have shortness of breath or trouble breathing.  You develop excessive weakness.  You are dizzy or you faint.  You are extremely thirsty or you are making little or no urine.  You develop new pain that was not there before (such as in the head, neck, chest, back, or abdomen).  You have persistant vomiting and diarrhea for more than 1 to 2 days.  You develop a stiff neck or your eyes become sensitive to light.  You develop a  skin rash.  You have a fever or persistent symptoms for more than 2 to 3 days.  You have a fever and your symptoms suddenly get worse. MAKE SURE YOU:   Understand these instructions.  Will watch your condition.  Will get help right away if you are not doing well or get worse. Document Released: 02/17/2001 Document Revised: 11/16/2011 Document  Reviewed: 06/25/2011 [facility] Patient Information 2014 [place], [place].  Anemia, Nonspecific Anemia is a condition in which the concentration of red blood cells or hemoglobin in the blood is below normal. Hemoglobin is a substance in red blood cells that carries oxygen to the tissues of the body. Anemia results in not enough oxygen reaching these tissues.  CAUSES  Common causes of anemia include:   Excessive bleeding. Bleeding may be internal or external. This includes excessive bleeding from periods (in women) or from the intestine.   Poor nutrition.   Chronic kidney, thyroid, and liver disease.  Bone marrow disorders that decrease red blood cell production.  Cancer and treatments for cancer.  HIV, AIDS, and their treatments.  Spleen problems that increase red blood cell destruction.  Blood disorders.  Excess destruction of red blood cells due to infection, medicines, and autoimmune disorders. SIGNS AND SYMPTOMS   Minor weakness.   Dizziness.   Headache.  Palpitations.   Shortness of breath, especially with exercise.   Paleness.  Cold sensitivity.  Indigestion.  Nausea.  Difficulty sleeping.  Difficulty concentrating. Symptoms may occur suddenly or they may develop slowly.  DIAGNOSIS  Additional blood tests are often needed. These help your health care provider determine the best treatment. Your health care provider will check your stool for blood and look for other causes of blood loss.  TREATMENT  Treatment varies depending on the cause of the anemia. Treatment can include:   Supplements of iron, vitamin S28, or folic acid.   Hormone medicines.   A blood transfusion. This may be needed if blood loss is severe.   Hospitalization. This may be needed if there is significant continual blood loss.   Dietary changes.  Spleen removal. HOME CARE INSTRUCTIONS Keep all follow-up appointments. It often takes many weeks to correct anemia, and having  your health care provider check on your condition and your response to treatment is very important. SEEK IMMEDIATE MEDICAL CARE IF:   You develop extreme weakness, shortness of breath, or chest pain.   You become dizzy or have trouble concentrating.  You develop heavy vaginal bleeding.   You develop a rash.   You have bloody or black, tarry stools.   You faint.   You vomit up blood.   You vomit repeatedly.   You have abdominal pain.  You have a fever or persistent symptoms for more than 2 3 days.   You have a fever and your symptoms suddenly get worse.   You are dehydrated.  MAKE SURE YOU:  Understand these instructions.  Will watch your condition.  Will get help right away if you are not doing well or get worse. Document Released: 10/01/2004 Document Revised: 04/26/2013 Document Reviewed: 02/17/2013 [facility] Patient Information 2014 [place].

## 2013-10-16 NOTE — ED Notes (Signed)
Patient transported to CT 

## 2013-10-16 NOTE — ED Notes (Addendum)
Patient reports fever x3 weeks with occasional shortness of breath. Patient was seen by Shelah Lewandowsky  On 10/03/13 given Septra DS and xanax 0.25mg . Patient reports taking tylenol and ibuprofen for fever. Patient was also told to come by Dr at [facility] because he was anemic. Per patient "has been sick since September." Patient reports infected prostate with weight loss of 40lbs since.

## 2013-10-16 NOTE — Telephone Encounter (Signed)
Appointment made with with bill

## 2013-10-16 NOTE — Progress Notes (Signed)
   Subjective:    Patient ID: Austin Robbins, male    DOB: 1955-12-15, 58 y.o.   MRN: [id number]  HPI This 58 y.o. male presents for evaluation of fever and pain in his penis and testicle area. He has had these sx's 10 days ago.  He was diagnosed with prostatitis and tx with bactrim DS. He has been having chills, fever, and rigors.  He states his inguinal and lower abdomen pain Are gone but his fevers have not subsided.  He has been having anemia as well.  He was referred to GI.    Review of Systems C/o fever, testicular, penile pain, rigors, and weakness. No chest pain, SOB, HA, dizziness, vision change, N/V, diarrhea, constipation, dysuria, urinary urgency or frequency, myalgias, arthralgias or rash.     Objective:   Physical Exam  Vital signs noted  Acutely ill appearing male.  HEENT - Head atraumatic Normocephalic                Eyes - PERRLA, Conjuctiva - clear Sclera- Clear EOMI                Ears - EAC's Wnl TM's Wnl Gross Hearing WNL                Nose - Nares patent                 Throat - oropharanx wnl Respiratory - Lungs CTA bilateral Cardiac - RRR S1 and S2 without murmur GI - Abdomen soft Nontender and bowel sounds are active x 4. GU - Testes soft no masses, no tenderness, uncircumcised, no inguinal hernia. Rectal - Normal rectal tone, hemocult positive, no rectal masses.  Results for orders placed in visit on 10/16/13  POCT CBC      Result Value Range   WBC 11.8 (*) 4.6 - 10.2 K/uL   Lymph, poc 2.1  0.6 - 3.4   POC LYMPH PERCENT 17.8  10 - 50 %L   MID (cbc)    0 - 0.9   POC MID %    0 - 12 %M   POC Granulocyte 9.4 (*) 2 - 6.9   Granulocyte percent 79.4  37 - 80 %G   RBC 4.0 (*) 4.69 - 6.13 M/uL   Hemoglobin 8.8 (*) 14.1 - 18.1 g/dL   HCT, POC 28.6 (*) 43.5 - 53.7 %   MCV 71.1 (*) 80 - 97 fL   MCH, POC 21.9 (*) 27 - 31.2 pg   MCHC 30.8 (*) 31.8 - 35.4 g/dL   RDW, POC 17.7     Platelet Count, POC 343.0  142 - 424 K/uL   MPV 7.3  0 - 99.8 fL      Assessment & Plan:  Fever - Plan: POCT CBC  Elevated white blood cell count - Plan: POCT CBC  Rectal bleeding - [place] ED and spoke with ED physician and patient is told to go to ED [facility] [place] F[facility]

## 2013-10-16 NOTE — ED Notes (Signed)
Provided urinal to patient and notified need of urine specimen. Patient cannot urinate at this time but will attempt soon.

## 2013-10-16 NOTE — Patient Instructions (Signed)
Fever, Adult A fever is a higher than normal body temperature. In an adult, an oral temperature around 98.6 F (37 C) is considered normal. A temperature of 100.4 F (38 C) or higher is generally considered a fever. Mild or moderate fevers generally have no long-term effects and often do not require treatment. Extreme fever (greater than or equal to 106 F or 41.1 C) can cause seizures. The sweating that may occur with repeated or prolonged fever may cause dehydration. Elderly people can develop confusion during a fever. A measured temperature can vary with:  Age.  Time of day.  Method of measurement (mouth, underarm, rectal, or ear). The fever is confirmed by taking a temperature with a thermometer. Temperatures can be taken different ways. Some methods are accurate and some are not.  An oral temperature is used most commonly. Electronic thermometers are fast and accurate.  An ear temperature will only be accurate if the thermometer is positioned as recommended by the manufacturer.  A rectal temperature is accurate and done for those adults who have a condition where an oral temperature cannot be taken.  An underarm (axillary) temperature is not accurate and not recommended. Fever is a symptom, not a disease.  CAUSES   Infections commonly cause fever.  Some noninfectious causes for fever include:  Some arthritis conditions.  Some thyroid or adrenal gland conditions.  Some immune system conditions.  Some types of cancer.  A medicine reaction.  High doses of certain street drugs such as methamphetamine.  Dehydration.  Exposure to high outside or room temperatures.  Occasionally, the source of a fever cannot be determined. This is sometimes called a "fever of unknown origin" (FUO).  Some situations may lead to a temporary rise in body temperature that may go away on its own. Examples are:  Childbirth.  Surgery.  Intense exercise. HOME CARE INSTRUCTIONS   Take  appropriate medicines for fever. Follow dosing instructions carefully. If you use acetaminophen to reduce the fever, be careful to avoid taking other medicines that also contain acetaminophen. Do not take aspirin for a fever if you are younger than age 19. There is an association with Reye's syndrome. Reye's syndrome is a rare but potentially deadly disease.  If an infection is present and antibiotics have been prescribed, take them as directed. Finish them even if you start to feel better.  Rest as needed.  Maintain an adequate fluid intake. To prevent dehydration during an illness with prolonged or recurrent fever, you may need to drink extra fluid.Drink enough fluids to keep your urine clear or pale yellow.  Sponging or bathing with room temperature water may help reduce body temperature. Do not use ice water or alcohol sponge baths.  Dress comfortably, but do not over-bundle. SEEK MEDICAL CARE IF:   You are unable to keep fluids down.  You develop vomiting or diarrhea.  You are not feeling at least partly better after 3 days.  You develop new symptoms or problems. SEEK IMMEDIATE MEDICAL CARE IF:   You have shortness of breath or trouble breathing.  You develop excessive weakness.  You are dizzy or you faint.  You are extremely thirsty or you are making little or no urine.  You develop new pain that was not there before (such as in the head, neck, chest, back, or abdomen).  You have persistant vomiting and diarrhea for more than 1 to 2 days.  You develop a stiff neck or your eyes become sensitive to light.  You develop a   skin rash.  You have a fever or persistent symptoms for more than 2 to 3 days.  You have a fever and your symptoms suddenly get worse. MAKE SURE YOU:   Understand these instructions.  Will watch your condition.  Will get help right away if you are not doing well or get worse. Document Released: 02/17/2001 Document Revised: 11/16/2011 Document  Reviewed: 06/25/2011 [employer] Patient Information 2014 [employer].  

## 2013-10-18 LAB — URINE CULTURE
CULTURE: NO GROWTH
Colony Count: NO GROWTH

## 2013-10-21 LAB — CULTURE, BLOOD (ROUTINE X 2)
CULTURE: NO GROWTH
Culture: NO GROWTH

## 2013-10-25 ENCOUNTER — Ambulatory Visit (AMBULATORY_SURGERY_CENTER): Payer: 59 | Admitting: Internal Medicine

## 2013-10-25 ENCOUNTER — Encounter: Payer: Self-pay | Admitting: Internal Medicine

## 2013-10-25 ENCOUNTER — Other Ambulatory Visit: Payer: Self-pay | Admitting: Internal Medicine

## 2013-10-25 VITALS — BP 124/65 | HR 82 | Temp 101.1°F | Resp 20 | Ht 72.0 in | Wt 215.0 lb

## 2013-10-25 DIAGNOSIS — D126 Benign neoplasm of colon, unspecified: Secondary | ICD-10-CM

## 2013-10-25 DIAGNOSIS — R198 Other specified symptoms and signs involving the digestive system and abdomen: Secondary | ICD-10-CM

## 2013-10-25 DIAGNOSIS — K6289 Other specified diseases of anus and rectum: Secondary | ICD-10-CM

## 2013-10-25 DIAGNOSIS — C20 Malignant neoplasm of rectum: Secondary | ICD-10-CM

## 2013-10-25 DIAGNOSIS — Z860100 Personal history of colon polyps, unspecified: Secondary | ICD-10-CM

## 2013-10-25 HISTORY — PX: COLONOSCOPY: SHX174

## 2013-10-25 HISTORY — DX: Personal history of colonic polyps: Z86.010

## 2013-10-25 HISTORY — DX: Personal history of colon polyps, unspecified: Z86.0100

## 2013-10-25 HISTORY — DX: Malignant neoplasm of rectum: C20

## 2013-10-25 MED ORDER — SODIUM CHLORIDE 0.9 % IV SOLN: 500.0000 mL | INTRAVENOUS | Status: DC

## 2013-10-25 NOTE — Progress Notes (Signed)
Pt states has been running a fever for over a month. States went to [facility] emg.room a week ago, was given iv fluids and blood cultures were drawn along with a chest xray. States all results was negative.

## 2013-10-25 NOTE — Patient Instructions (Addendum)
There is a mass in the rectum that unfortunately looks like cancer. I took biopsies and should know the official results by Friday.  You need to have CT scans of your chest, abdomen and pelvis which my office will arrange.  Gatha Mayer, MD, FACG   YOU HAD AN ENDOSCOPIC PROCEDURE TODAY AT [facility]: Refer to the procedure report that was given to you for any specific questions about what was found during the examination.  If the procedure report does not answer your questions, please call your gastroenterologist to clarify.  If you requested that your care partner not be given the details of your procedure findings, then the procedure report has been included in a sealed envelope for you to review at your convenience later.  YOU SHOULD EXPECT: Some feelings of bloating in the abdomen. Passage of more gas than usual.  Walking can help get rid of the air that was put into your GI tract during the procedure and reduce the bloating. If you had a lower endoscopy (such as a colonoscopy or flexible sigmoidoscopy) you may notice spotting of blood in your stool or on the toilet paper. If you underwent a bowel prep for your procedure, then you may not have a normal bowel movement for a few days.  DIET: Your first meal following the procedure should be a light meal and then it is ok to progress to your normal diet.  A half-sandwich or bowl of soup is an example of a good first meal.  Heavy or fried foods are harder to digest and may make you feel nauseous or bloated.  Likewise meals heavy in dairy and vegetables can cause extra gas to form and this can also increase the bloating.  Drink plenty of fluids but you should avoid alcoholic beverages for 24 hours.  ACTIVITY: Your care partner should take you home directly after the procedure.  You should plan to take it easy, moving slowly for the rest of the day.  You can resume normal activity the day after the procedure however you should NOT  DRIVE or use heavy machinery for 24 hours (because of the sedation medicines used during the test).    SYMPTOMS TO REPORT IMMEDIATELY: A gastroenterologist can be reached at any hour.  During normal business hours, 8:30 AM to 5:00 PM Monday through Friday, call [phone].  After hours and on weekends, please call the GI answering service at [phone] who will take a message and have the physician on call contact you.   Following lower endoscopy (colonoscopy or flexible sigmoidoscopy):  Excessive amounts of blood in the stool  Significant tenderness or worsening of abdominal pains  Swelling of the abdomen that is new, acute  Fever of 100F or higher    FOLLOW UP: If any biopsies were taken you will be contacted by phone or by letter within the next 1-3 weeks.  Call your gastroenterologist if you have not heard about the biopsies in 3 weeks.  Our staff will call the home number listed on your records the next business day following your procedure to check on you and address any questions or concerns that you may have at that time regarding the information given to you following your procedure. This is a courtesy call and so if there is no answer at the home number and we have not heard from you through the emergency physician on call, we will assume that you have returned to your regular daily activities without  incident.  SIGNATURES/CONFIDENTIALITY: You and/or your care partner have signed paperwork which will be entered into your electronic medical record.  These signatures attest to the fact that that the information above on your After Visit Summary has been reviewed and is understood.  Full responsibility of the confidentiality of this discharge information lies with you and/or your care-partner.

## 2013-10-25 NOTE — Progress Notes (Addendum)
Called to room to assist during endoscopic procedure.  Patient ID and intended procedure confirmed with present staff. Received instructions for my participation in the procedure from the performing physician  Path sent rush per MD

## 2013-10-25 NOTE — Op Note (Addendum)
[address]  [address] [place], [address]   COLONOSCOPY PROCEDURE REPORT  PATIENT: Austin Robbins, Austin Robbins  MR#: [id number] BIRTHDATE: 1956/07/08 , 57  yrs. old GENDER: Male ENDOSCOPIST: Gatha Mayer, MD, [facility] PROCEDURE DATE:  10/25/2013 PROCEDURE:   Colonoscopy with biopsy , submucosal injection First Screening Colonoscopy - Avg.  risk and is 50 yrs.  old or older - No.  Prior Negative Screening - Now for repeat screening. N/A  History of Adenoma - Now for follow-up colonoscopy & has been > or = to 3 yrs.  N/A  Polyps Removed Today? Yes. ASA CLASS:   Class II INDICATIONS:Change in bowel habits and Iron Deficiency Anemia. MEDICATIONS: propofol (Diprivan) 350mg  IV, MAC sedation, administered by CRNA, and These medications were titrated to patient response per physician's verbal order  DESCRIPTION OF PROCEDURE:   After the risks benefits and alternatives of the procedure were thoroughly explained, informed consent was obtained.  A digital rectal exam revealed a palpable rectal mass.   The LB [id number]  endoscope was introduced through the anus and advanced to the terminal ileum which was intubated for a short distance. No adverse events experienced. The quality of the prep was excellent using Suprep  The instrument was then slowly withdrawn as the colon was fully examined.  COLON FINDINGS: A circumferential polypoid shaped and fungating mass with friable surfaces was found in the rectum.  Multiple biopsies were performed using cold forceps. The proximal area that looked normal was injected (submucosal) with SPOT tattoo in 4 quadrants. A sessile polyp measuring 2 mm in size was found in the transverse colon.  A polypectomy was performed with cold forceps.  The resection was complete and the polyp tissue was completely retrieved.   Moderate diverticulosis was noted The finding was in the left colon.   The colon mucosa was otherwise normal.   The mucosa  appeared normal in the terminal ileum.  Retroflexion was not performed due to a narrow rectal vault. The time to cecum=1 minutes 05 seconds.  Withdrawal time=11 minutes 50 seconds.  The scope was withdrawn and the procedure completed. COMPLICATIONS: There were no complications.  ENDOSCOPIC IMPRESSION: 1.   Circumferential mass were found in the rectum; multiple biopsies were performed using cold forceps - looks like carcinoma - proximal normal area - submucosal tattoo injection. 2.   Sessile polyp measuring 2 mm in size was found in the transverse colon; polypectomy was performed with cold forceps 3.   Moderate diverticulosis was noted in the left colon 4.   The colon mucosa was otherwise normal - excellent prep 5.   Normal mucosa in the terminal ileum  RECOMMENDATIONS: 1.  Office will call with the results. 2.  My office will arrange for you to have a CT scan of chest, abdomen and pelvis with contrast  eSigned:  Gatha Mayer, MD, [facility] 10/25/2013 2:10 PM Revised: 10/25/2013 2:10 PM cc: Chevis Pretty, NP and The Patient

## 2013-10-26 ENCOUNTER — Encounter: Payer: Self-pay | Admitting: Internal Medicine

## 2013-10-26 ENCOUNTER — Other Ambulatory Visit: Payer: Self-pay

## 2013-10-26 ENCOUNTER — Telehealth: Payer: Self-pay | Admitting: *Deleted

## 2013-10-26 DIAGNOSIS — C20 Malignant neoplasm of rectum: Secondary | ICD-10-CM

## 2013-10-26 DIAGNOSIS — R509 Fever, unspecified: Secondary | ICD-10-CM | POA: Insufficient documentation

## 2013-10-26 NOTE — Progress Notes (Signed)
Quick Note:  I called results confirming suspected rectal cancer He needs Chest/abd and pelvic CT soon and appointment with Dr. Marcello Moores of [facility] He has contrast (oral)  [facility] - colon recall 1 week and no letter needed ______

## 2013-10-26 NOTE — Telephone Encounter (Signed)
  Follow up Call-  Call back number 10/25/2013  Post procedure Call Back phone  # [phone]  Permission to leave phone message Yes     Patient questions:  Do you have a fever, pain , or abdominal swelling? yes Pain Score  0 *  Have you tolerated food without any problems? yes  Have you been able to return to your normal activities? yes  Do you have any questions about your discharge instructions: Diet   no Medications  no Follow up visit  no  Do you have questions or concerns about your Care? no  Actions: * If pain score is 4 or above: No action needed, pain <4. Patient stating he had night sweat last evening as before. Patient stating he has run a fever for the last few months, presently 98.9

## 2013-10-31 ENCOUNTER — Other Ambulatory Visit: Payer: 59

## 2013-11-01 ENCOUNTER — Ambulatory Visit (INDEPENDENT_AMBULATORY_CARE_PROVIDER_SITE_OTHER)
Admission: RE | Admit: 2013-11-01 | Discharge: 2013-11-01 | Disposition: A | Discharge status: Home / Self Care | Payer: 59 | Source: Ambulatory Visit | Attending: Internal Medicine | Admitting: Internal Medicine

## 2013-11-01 ENCOUNTER — Encounter (HOSPITAL_COMMUNITY): Payer: Self-pay | Admitting: Emergency Medicine

## 2013-11-01 ENCOUNTER — Inpatient Hospital Stay (HOSPITAL_COMMUNITY)
Admission: EM | Admit: 2013-11-01 | Discharge: 2013-11-10 | DRG: 332 | Disposition: A | Discharge status: Home Health | Payer: 59 | Attending: General Surgery | Admitting: General Surgery

## 2013-11-01 DIAGNOSIS — K560 Paralytic ileus: Secondary | ICD-10-CM | POA: Diagnosis not present

## 2013-11-01 DIAGNOSIS — C20 Malignant neoplasm of rectum: Secondary | ICD-10-CM

## 2013-11-01 DIAGNOSIS — Q438 Other specified congenital malformations of intestine: Secondary | ICD-10-CM

## 2013-11-01 DIAGNOSIS — K651 Peritoneal abscess: Secondary | ICD-10-CM | POA: Diagnosis present

## 2013-11-01 DIAGNOSIS — K2970 Gastritis, unspecified, without bleeding: Secondary | ICD-10-CM | POA: Diagnosis present

## 2013-11-01 DIAGNOSIS — C7919 Secondary malignant neoplasm of other urinary organs: Secondary | ICD-10-CM | POA: Diagnosis present

## 2013-11-01 DIAGNOSIS — R8281 Pyuria: Secondary | ICD-10-CM | POA: Diagnosis present

## 2013-11-01 DIAGNOSIS — F410 Panic disorder [episodic paroxysmal anxiety] without agoraphobia: Secondary | ICD-10-CM | POA: Diagnosis present

## 2013-11-01 DIAGNOSIS — N368 Other specified disorders of urethra: Secondary | ICD-10-CM | POA: Diagnosis present

## 2013-11-01 DIAGNOSIS — Z860100 Personal history of colon polyps, unspecified: Secondary | ICD-10-CM

## 2013-11-01 DIAGNOSIS — K631 Perforation of intestine (nontraumatic): Secondary | ICD-10-CM

## 2013-11-01 DIAGNOSIS — D500 Iron deficiency anemia secondary to blood loss (chronic): Secondary | ICD-10-CM | POA: Diagnosis present

## 2013-11-01 DIAGNOSIS — N419 Inflammatory disease of prostate, unspecified: Secondary | ICD-10-CM | POA: Diagnosis present

## 2013-11-01 DIAGNOSIS — K6389 Other specified diseases of intestine: Secondary | ICD-10-CM | POA: Diagnosis present

## 2013-11-01 DIAGNOSIS — C189 Malignant neoplasm of colon, unspecified: Secondary | ICD-10-CM | POA: Diagnosis present

## 2013-11-01 DIAGNOSIS — N321 Vesicointestinal fistula: Secondary | ICD-10-CM | POA: Diagnosis present

## 2013-11-01 DIAGNOSIS — D638 Anemia in other chronic diseases classified elsewhere: Secondary | ICD-10-CM | POA: Diagnosis present

## 2013-11-01 DIAGNOSIS — K6289 Other specified diseases of anus and rectum: Secondary | ICD-10-CM | POA: Diagnosis present

## 2013-11-01 DIAGNOSIS — D62 Acute posthemorrhagic anemia: Secondary | ICD-10-CM | POA: Diagnosis not present

## 2013-11-01 DIAGNOSIS — E46 Unspecified protein-calorie malnutrition: Secondary | ICD-10-CM | POA: Diagnosis present

## 2013-11-01 DIAGNOSIS — N3289 Other specified disorders of bladder: Secondary | ICD-10-CM | POA: Diagnosis present

## 2013-11-01 DIAGNOSIS — Z801 Family history of malignant neoplasm of trachea, bronchus and lung: Secondary | ICD-10-CM

## 2013-11-01 LAB — CBC WITH DIFFERENTIAL/PLATELET
Basophils Absolute: 0 10*3/uL (ref 0.0–0.1)
Basophils Relative: 0 % (ref 0–1)
EOS PCT: 0 % (ref 0–5)
Eosinophils Absolute: 0 10*3/uL (ref 0.0–0.7)
HCT: 24.4 % — ABNORMAL LOW (ref 39.0–52.0)
Hemoglobin: 7.4 g/dL — ABNORMAL LOW (ref 13.0–17.0)
Lymphocytes Relative: 11 % — ABNORMAL LOW (ref 12–46)
Lymphs Abs: 1.9 10*3/uL (ref 0.7–4.0)
MCH: 21.9 pg — ABNORMAL LOW (ref 26.0–34.0)
MCHC: 30.3 g/dL (ref 30.0–36.0)
MCV: 72.2 fL — ABNORMAL LOW (ref 78.0–100.0)
MONOS PCT: 9 % (ref 3–12)
Monocytes Absolute: 1.6 10*3/uL — ABNORMAL HIGH (ref 0.1–1.0)
NEUTROS PCT: 80 % — AB (ref 43–77)
Neutro Abs: 14 10*3/uL — ABNORMAL HIGH (ref 1.7–7.7)
PLATELETS: 512 10*3/uL — AB (ref 150–400)
RBC: 3.38 MIL/uL — AB (ref 4.22–5.81)
RDW: 17.2 % — ABNORMAL HIGH (ref 11.5–15.5)
WBC: 17.5 10*3/uL — AB (ref 4.0–10.5)

## 2013-11-01 LAB — COMPREHENSIVE METABOLIC PANEL
ALK PHOS: 107 U/L (ref 39–117)
ALT: 13 U/L (ref 0–53)
AST: 15 U/L (ref 0–37)
Albumin: 2.6 g/dL — ABNORMAL LOW (ref 3.5–5.2)
BUN: 15 mg/dL (ref 6–23)
CO2: 20 mEq/L (ref 19–32)
Calcium: 9.4 mg/dL (ref 8.4–10.5)
Chloride: 92 mEq/L — ABNORMAL LOW (ref 96–112)
Creatinine, Ser: 1.07 mg/dL (ref 0.50–1.35)
GFR calc Af Amer: 87 mL/min — ABNORMAL LOW (ref >90)
GFR calc non Af Amer: 75 mL/min — ABNORMAL LOW (ref >90)
Glucose, Bld: 101 mg/dL — ABNORMAL HIGH (ref 70–99)
Potassium: 4.1 mEq/L (ref 3.7–5.3)
Sodium: 132 mEq/L — ABNORMAL LOW (ref 137–147)
TOTAL PROTEIN: 7.7 g/dL (ref 6.0–8.3)
Total Bilirubin: 0.5 mg/dL (ref 0.3–1.2)

## 2013-11-01 LAB — CBC
HCT: 22.3 % — ABNORMAL LOW (ref 39.0–52.0)
HEMOGLOBIN: 6.9 g/dL — AB (ref 13.0–17.0)
MCH: 22 pg — ABNORMAL LOW (ref 26.0–34.0)
MCHC: 30.9 g/dL (ref 30.0–36.0)
MCV: 71.2 fL — ABNORMAL LOW (ref 78.0–100.0)
Platelets: 463 10*3/uL — ABNORMAL HIGH (ref 150–400)
RBC: 3.13 MIL/uL — ABNORMAL LOW (ref 4.22–5.81)
RDW: 17.3 % — ABNORMAL HIGH (ref 11.5–15.5)
WBC: 14.9 10*3/uL — ABNORMAL HIGH (ref 4.0–10.5)

## 2013-11-01 LAB — URINALYSIS, ROUTINE W REFLEX MICROSCOPIC
Bilirubin Urine: NEGATIVE
Glucose, UA: NEGATIVE mg/dL
Ketones, ur: NEGATIVE mg/dL
NITRITE: NEGATIVE
PH: 6 (ref 5.0–8.0)
Protein, ur: >300 mg/dL — AB
Urobilinogen, UA: 1 mg/dL (ref 0.0–1.0)

## 2013-11-01 LAB — URINE MICROSCOPIC-ADD ON

## 2013-11-01 LAB — PROTIME-INR
INR: 1.24 (ref 0.00–1.49)
PROTHROMBIN TIME: 15.3 s — AB (ref 11.6–15.2)

## 2013-11-01 LAB — APTT: aPTT: 41 seconds — ABNORMAL HIGH (ref 24–37)

## 2013-11-01 MED ORDER — DIPHENHYDRAMINE HCL 12.5 MG/5ML PO ELIX
12.5000 mg | ORAL_SOLUTION | Freq: Four times a day (QID) | ORAL | Status: DC | PRN
Start: 2013-11-01 — End: 2013-11-03

## 2013-11-01 MED ORDER — DIPHENHYDRAMINE HCL 50 MG/ML IJ SOLN: 12.5000 mg | Freq: Four times a day (QID) | INTRAMUSCULAR | Status: DC | PRN

## 2013-11-01 MED ORDER — INSULIN ASPART 100 UNIT/ML ~~LOC~~ SOLN
0.0000 [IU] | SUBCUTANEOUS | Status: DC
  Administered 2013-11-02: 2 [IU] via SUBCUTANEOUS
  Administered 2013-11-03: 1 [IU] via SUBCUTANEOUS
  Administered 2013-11-03: 3 [IU] via SUBCUTANEOUS
  Administered 2013-11-03 (×2): 1 [IU] via SUBCUTANEOUS

## 2013-11-01 MED ORDER — SODIUM CHLORIDE 0.9 % IV SOLN
1.0000 g | INTRAVENOUS | Status: DC
  Administered 2013-11-01 – 2013-11-03 (×3): 1 g via INTRAVENOUS
  Filled 2013-11-01 (×3): qty 1

## 2013-11-01 MED ORDER — MORPHINE SULFATE 2 MG/ML IJ SOLN
1.0000 mg | INTRAMUSCULAR | Status: DC | PRN
  Administered 2013-11-02 – 2013-11-03 (×6): 2 mg via INTRAVENOUS
  Filled 2013-11-01 (×6): qty 1

## 2013-11-01 MED ORDER — ONDANSETRON HCL 4 MG/2ML IJ SOLN: 4.0000 mg | Freq: Four times a day (QID) | INTRAMUSCULAR | Status: DC | PRN

## 2013-11-01 MED ORDER — PANTOPRAZOLE SODIUM 40 MG IV SOLR
40.0000 mg | Freq: Every day | INTRAVENOUS | Status: DC
  Administered 2013-11-02 (×2): 40 mg via INTRAVENOUS
  Filled 2013-11-01 (×3): qty 40

## 2013-11-01 MED ORDER — KCL IN DEXTROSE-NACL 20-5-0.45 MEQ/L-%-% IV SOLN
INTRAVENOUS | Status: AC
  Administered 2013-11-01 – 2013-11-02 (×3): via INTRAVENOUS
  Filled 2013-11-01 (×4): qty 1000

## 2013-11-01 MED ORDER — ACETAMINOPHEN 650 MG RE SUPP: 650.0000 mg | Freq: Four times a day (QID) | RECTAL | Status: DC | PRN

## 2013-11-01 MED ORDER — ACETAMINOPHEN 325 MG PO TABS: 650.0000 mg | ORAL_TABLET | Freq: Four times a day (QID) | ORAL | Status: DC | PRN

## 2013-11-01 MED ORDER — HEPARIN SODIUM (PORCINE) 5000 UNIT/ML IJ SOLN
5000.0000 [IU] | Freq: Three times a day (TID) | INTRAMUSCULAR | Status: DC
  Administered 2013-11-02 – 2013-11-03 (×4): 5000 [IU] via SUBCUTANEOUS
  Filled 2013-11-01 (×8): qty 1

## 2013-11-01 MED ORDER — SODIUM CHLORIDE 0.9 % IJ SOLN
10.0000 mL | INTRAMUSCULAR | Status: DC | PRN
Start: 2013-11-01 — End: 2013-11-03
  Administered 2013-11-02 (×3): 10 mL

## 2013-11-01 MED ORDER — ACETAMINOPHEN 325 MG PO TABS
650.0000 mg | ORAL_TABLET | Freq: Once | ORAL | Status: AC
  Administered 2013-11-01: 650 mg via ORAL
  Filled 2013-11-01: qty 2

## 2013-11-01 MED ORDER — IOHEXOL 300 MG/ML  SOLN
100.0000 mL | Freq: Once | INTRAMUSCULAR | Status: AC | PRN
  Administered 2013-11-01: 100 mL via INTRAVENOUS

## 2013-11-01 NOTE — Consult Note (Signed)
Urology Consult Consulting physician: Dr. Leighton Ruff  CC: Rectal cancer  HPI: 58 year old male admitted for further management of rectal carcinoma. The patient has had a 45-50 pound weight loss over the past few months. He also has a history of abdominal pain, loose bowel movements, abdominal distention and prostatitis. He has been on antibiotics for some time for prostatitis. Symptoms related to that include frequency, dysuria, and today, the patient has had pyuria and pneumaturia. Those 2 symptoms just started today. He underwent colonoscopy approximately a week ago by Dr. Silvano Rusk, and was found to have rectal carcinoma. He had a CT scan performed today which revealed distended colon, extraluminal air within his pelvis, with his bladder indistinctly seen. There was no evidence of hydronephrosis.  Because of the patient's large rectal carcinoma, with obvious GU involvement, urologic consultation is requested by Dr. Marcello Moores. The patient will be scheduled for surgery in the near future.  PMH: Past Medical History  Diagnosis Date  . H. pylori infection 08/07/2013  . Anemia   . Gastritis   . Panic attacks   . Adenocarcinoma of rectum 10/25/2013    10/25/2013 colonoscopy  . Personal history of colonic polyps - adenoma 10/25/2013    PSH: Past Surgical History  Procedure Laterality Date  . Tonsillectomy      Allergies: Allergies  Allergen Reactions  . Codeine Other (See Comments)    Chest pain    Medications:  (Not in a hospital admission)   Social History: History   Social History  . Marital Status: Married    Spouse Name: N/A    Number of Children: 2  . Years of Education: N/A   Occupational History  . Architect    Social History Main Topics  . Smoking status: Never Smoker   . Smokeless tobacco: Never Used  . Alcohol Use: No     Comment: no Alcohol since September  . Drug Use: No  . Sexual Activity: Not on file   Other Topics Concern  . 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 + children  Never smoker. 2 caffeine drinks per day. Prior regular fairly heavy alcohol until recently as of 10/06/2013.          Family History: Family History  Problem Relation Age of Onset  . Healthy Mother   . Lung cancer Father     smoker    Review of Systems: Positive: Dysuria, frequency, abdominal distention, abdominal pain, pyuria, pneumaturia, weight loss Negative:  A further 10 point review of systems was negative except what is listed in the HPI.  Physical Exam: @VITALS2 @ General: No acute distress.  Awake. Head:  Normocephalic.  Atraumatic. ENT:  EOMI.  Mucous membranes moist Neck:  Supple.  No lymphadenopathy. CV:  S1 present. S2 present. Regular rate. Pulmonary: Equal effort bilaterally.  Clear to auscultation bilaterally. Abdomen: Soft.  Non- tender to palpation. There was mild distention. No specific rebound or guarding was noted. Skin:  Normal turgor.  No visible rash. Extremity: No gross deformity of bilateral upper extremities.  No gross deformity of    bilateral lower extremities. Neurologic: Alert. Appropriate mood.  Penis:  Uncircumcised.  No lesions. No discharge was noted Urethra:  Orthotopic meatus. Scrotum: No lesions.  No ecchymosis.  No erythema. Testicles: Descended bilaterally.  No masses bilaterally. Epididymis: Palpable bilaterally.  Non Tender to palpation. Rectal:  Normal anal sphincter tone. Large pedunculated skin tag noted. Prostate is indistinctly  palpable, involved with significant induration anteriorly. There is mild tenderness.  Studies:  Recent Labs     11/01/13  1715  HGB  7.4*  WBC  17.5*  PLT  512*    No results found for this basename: NA, K, CL, CO2, BUN, CREATININE, CALCIUM, MAGNESIUM, GFRNONAA, GFRAA,  in the last 72 hours   Recent Labs     11/01/13  1715  INR  1.24  APTT  41*    I reviewed the patient's CT images. Kidneys appear  normal. There is no hydroureteronephrosis. Contrast does not fill the distal ureters. Bladder is somewhat small, and is not visualized, as no contrast is present. There is an obvious pelvic phlegmon and mass. There seems to be gas within the bladder. No components found with this basename: ABG,     Assessment:  Adenocarcinoma of the rectum. He has a pelvic/extraperitoneal perforation. The patient more than likely has a colovesical fistula with new onset pyuria and pneumaturia. This may just be an abscess cavity that opened up, but this is suspicious for fistula.  Plan: I spoke at length with the patient, his wife and mother today. We will be more than happy to help with stent placement prior to his laparotomy. I do worry that with his significant induration around his prostate, that the trigone of the bladder will be significantly distorted, and it may be difficult to see the landmarks/ureteral orifice is to place stents. Because of this, I think it worthwhile to perform bedside cystoscopy tomorrow morning. No doubt, he will have significant pyuria, and prior to cystoscopy he will need bladder irrigation, which all performed at that time. If it is difficult to see his ureteral orifices, I think consideration should be given to percutaneous nephrostomy tube placement with antegrade nephroureteral catheters. If necessary, this can be performed tomorrow night by interventional radiology. The patient and his family understand this process.  I have talked with one of my associates about possibly helping with laparotomy on Friday if needed. I think we will be able to have somewhat available for the procedure.   CC: Dr. Marcello Moores Pager:[phone]

## 2013-11-01 NOTE — Progress Notes (Signed)
Quick Note:  I called results and explained he will need to come to hospital - will call back with more specific instructions soon - waiting to discuss with surger ______

## 2013-11-01 NOTE — ED Notes (Signed)
Dr Carlean Purl advised pt to come to ER for surgery consult by Dr Marcello Moores

## 2013-11-01 NOTE — Progress Notes (Signed)
Notified on call doctor for [facility] Dr. Marlou Starks that pts hgb was 6.9. Pt ordered type and screen if not already completed. Pt type and screen was completed and is current. Pt is stable, will continue to monitor.

## 2013-11-01 NOTE — ED Notes (Signed)
Pt had colonoscopy last wk that showed rectal tumor; today had a total body ct that showed a possible pocket of infection around the tumor; pt states twice today urinated and looked like pure pus. Fever off and on x 1 month; has been treated for prostatitis for past month

## 2013-11-01 NOTE — H&P (Signed)
Austin Robbins 1956-01-18  [id number].   Primary Care MD: Dr. Redge Gainer Requesting MD: None Chief Complaint/Reason for Consult: Perforated rectal cancer HPI: This is an otherwise healthy 58 year old white male who began having abdominal discomfort along with hyperactive bowel sounds in December. He went to see a provider and his primary care doctor's office. He was diagnosed with H. pylori and treated for that. However, 2 weeks later he continued to have some of the same symptoms. He returned and was diagnosed with prostatitis. He was given a 30 day course of Bactrim DS. This did not seem to improve his symptoms. He was then referred to Dr. Silvano Rusk with a [facility]. He underwent a colonoscopy last Wednesday and was found to have a large rectal mass. This was biopsied and found to be adenocarcinoma. He was sent today for screening CT scans. These included his chest, abdomen, and pelvis. On his scan, he was noted to have multiple pelvic abscesses along with some extraluminal air consistent with a rectal perforation from his adenocarcinoma. He was referred to [facility] for surgical admission.  The patient states that over the last 2-3 months he's had an unintentional 45 pound weight loss. He has developed night sweats since December. He admits to having a change in his bowel habits starting in November. These became more "mushy" and liquid-like. He began having a decrease in his appetite; however, he would still be about 2 meals a day. He noticed a very small amount of blood on his toilet paper but never and over amount in the toilet or his stool. He began running fevers in December and has had fever since then with no cessation. He denies ever having a colonoscopy before last week. He also denies any family history of GI or rectal cancer. He currently denies any abdominal bloating, nausea, or vomiting. He does admit that just today he has had pyuria both times he has urinated.  ROS :  Please see history of present illness, otherwise all other systems have been reviewed and are negative.  Family History  Problem Relation Age of Onset  . Healthy Mother   . Lung cancer Father     smoker    Past Medical History  Diagnosis Date  . H. pylori infection 08/07/2013  . Anemia   . Gastritis   . Panic attacks   . Adenocarcinoma of rectum 10/25/2013    10/25/2013 colonoscopy  . Personal history of colonic polyps - adenoma 10/25/2013    Past Surgical History  Procedure Laterality Date  . Tonsillectomy      Social History:  reports that he has never smoked. He has never used smokeless tobacco. He reports that he does not drink alcohol or use illicit drugs.  Allergies:  Allergies  Allergen Reactions  . Codeine Other (See Comments)    Chest pain     (Not in a hospital admission)  Blood pressure 126/66, pulse 99, temperature 101.1 F (38.4 C), temperature source Oral, resp. rate 16, SpO2 95.00%. Physical Exam: General: pleasant, WD, WN white male who is sitting up and appears worn out HEENT: head is normocephalic, atraumatic.  Sclera are noninjected.  PERRL.  Ears and nose without any masses or lesions.  Mouth is pink and moist Heart: regular, rate, and rhythm.  Normal s1,s2. No obvious murmurs, gallops, or rubs noted.  Palpable radial and pedal pulses bilaterally Lungs: CTAB, no wheezes, rhonchi, or rales noted.  Respiratory effort nonlabored Abd: soft, NT, ND, +BS, no masses,  hernias, or organomegaly MS: all 4 extremities are symmetrical with no cyanosis, clubbing, or edema. Skin: warm and dry with no masses, lesions, or rashes Psych: A&Ox3 with an appropriate affect.    No results found for this or any previous visit (from the past 48 hour(s)). Ct Chest W Contrast  11/01/2013   CLINICAL DATA:  Rectal mass on colonoscopy. Patient reports swelling in the left buttocks region  EXAM: CT CHEST, ABDOMEN, AND PELVIS WITH CONTRAST  TECHNIQUE: Multidetector CT imaging of  the chest, abdomen and pelvis was performed following the standard protocol during bolus administration of intravenous contrast.  CONTRAST:  100 cc Omnipaque  COMPARISON:  None  FINDINGS: CT CHEST FINDINGS  No axillary or supraclavicular lymphadenopathy. There are large round abnormal prevascular lymph nodes measuring up to 2.4 cm. Borderline enlarged subcarinal lymph node at 11 mm. Bilateral hilar adenopathy additionally.  Review of the lung parenchyma demonstrates no suspicious pulmonary nodules.  CT ABDOMEN AND PELVIS FINDINGS  No focal hepatic lesion. Gallbladder, pancreas, spleen, adrenal glands, and kidneys are normal. The stomach, small bowel, appendix, and cecum are normal.  There is a large volume stool in the ascending and transverse and descending colon. There is masslike thickening in the rectum. The tissue planes are poorly defined.  There is breakdown of the rectal wall wall evidence by several gas and is stool collections in the pelvis. One collection is inferior to the bladder elevates the bladder measuring measuring 6.0 x 5.5 cm. Cannot exclude involvement of the inferior wall the bladder by this infectious/neoplastic process.  Another collection is in the left obturator space measuring 5.2 x 7.9 cm. Large collection extends into the left perineum with a 7.7 x 3.8 cm stool and gas collection. The process also extends to the superior base of the bladder with gas and stool.  There are multiple metastatic lymph nodes in the perirectal fat measuring up to 12 mm on both sides (image 106, series 2). Metastatic lymph node in the sigmoid mesial colon measures 15 mm (image 95, series similar lymph nodes on image 102.  No aggressive osseous lesion.  IMPRESSION: 1. Large rectal mass consistent rectal carcinoma. 2. Evidence of breakdown of the rectal wall with several large extraluminal gas and stool collections within the pelvis. 3. One collection elevates the bladder. Cannot exclude involvement interval of  bladder. 4. Large abscess extends into the left perineum and the base of the penis. 5. Local nodal metastasis with multiple enlarged abnormal perirectal and mesocolonic nodes. 6. More distal metastasis with metastatic mediastinal and hilar lymph nodes. 7. Evidence of a partial bowel obstruction with significant amount of retained stool in the colon proximal to the sigmoid colon. Findings conveyed toCARL GESSNER on 11/01/2013  at12:34.   Electronically Signed   By: Suzy Bouchard M.D.   On: 11/01/2013 12:35       Assessment/Plan 1. Metastatic rectal adenocarcinoma 2. Perforated rectal adenocarcinoma 3. Multiple pelvic abscesses 4. Pyuria 5. Fevers 6. Anemia of chronic disease, last hemoglobin was 8.8 per wife 7. Hypoalbuminemia 8. History of panic attacks  Plan: We will get the patient admitted. We will start him on IV fluids as well as IV antibiotic therapy. This will be Invanz given the extensive infection he has in his pelvis. He will be allowed clear liquids tonight. We will check multiple labs including a CBC with differential, CMET, PTT, PT/INR, type and screen, and blood cultures given his persistent fevers and extensive pelvic infection. Given his history of hypoalbuminemia on his  prior lab work and expected hospital course, we will go ahead and place a PICC line and start TNA per pharmacy. We have also consulted urology. We are unable to locate his bladder on the CT scan as he may be draining abscess into his bladder and this may decompressed. We would like to fair evaluation as we may need their assistance in the operating room along with bilateral ureteral stent placement. We will go ahead and place a Foley catheter tonight. At some point within the next day or 2 the patient will need surgical intervention and resection of this tumor along with evacuation of infection in his pelvis. The patient will need a colostomy hence we will not try to prep the patient preoperatively. I have discussed  all of this with the patient and his wife. They understand and agree to proceed with this plan. Grayland Daisey E 11/01/2013, 4:21 PM Pager: [phone]

## 2013-11-01 NOTE — Progress Notes (Signed)
PARENTERAL NUTRITION CONSULT NOTE - INITIAL  Pharmacy Consult for TNA Indication: SBO  Allergies  Allergen Reactions  . Codeine Other (See Comments)    Chest pain    Patient Measurements: Height: 6' (182.9 cm) Weight: 199 lb 15.3 oz (90.7 kg) IBW/kg (Calculated) : 77.6 ABW: 81.5kg Usual Weight: 112kg  Vital Signs: Temp: 98.4 F (36.9 C) (02/25 1922) Temp src: Oral (02/25 1922) BP: 110/69 mmHg (02/25 1922) Pulse Rate: 73 (02/25 1922)   Intake/Output from this shift: Total I/O In: -  Out: 200 [Urine:200]  Labs:  Recent Labs  11/01/13 1715  WBC 17.5*  HGB 7.4*  HCT 24.4*  PLT 512*  APTT 41*  INR 1.24     Recent Labs  11/01/13 1715  NA 132*  K 4.1  CL 92*  CO2 20  GLUCOSE 101*  BUN 15  CREATININE 1.07  CALCIUM 9.4  PROT 7.7  ALBUMIN 2.6*  AST 15  ALT 13  ALKPHOS 107  BILITOT 0.5   Estimated Creatinine Clearance: 83.6 ml/min (by C-G formula based on Cr of 1.07).   No results found for this basename: GLUCAP,  in the last 72 hours  Medical History: Past Medical History  Diagnosis Date  . H. pylori infection 08/07/2013  . Anemia   . Gastritis   . Panic attacks   . Adenocarcinoma of rectum 10/25/2013    10/25/2013 colonoscopy  . Personal history of colonic polyps - adenoma 10/25/2013    Medications:  Scheduled:  . ertapenem  1 g Intravenous Q24H  . heparin  5,000 Units Subcutaneous 3 times per day  . pantoprazole (PROTONIX) IV  40 mg Intravenous QHS   Infusions:  . dextrose 5 % and 0.45 % NaCl with KCl 20 mEq/L 125 mL/hr at 11/01/13 1843    Insulin Requirements in the past 24 hours:  No insulin ordered  Current Nutrition:  CLD IVF: D5w 1/'2 NS w 20 mEq KCl at 125 ml/hr  Assessment: 89 yoM admitted 2/25 for further management of rectal carcinoma. The patient has had a 45-50 pound weight loss over the past few months. He also has a history of abdominal pain, loose bowel movements, abdominal distention and prostatitis. He underwent  colonoscopy approximately a week ago by Dr. Silvano Rusk, and was found to have rectal carcinoma. He had a CT scan performed today which revealed distended colon, extraluminal air within his pelvis, with his bladder indistinctly seen.  Patient does not have a hx of diabetes. Pharmacy has been consulted to dose TNA for bowel obstruction.  Labs:  Electrolytes: Na remains slightly low (cannot increase Na in premixed bag), K/Ca wnl Renal Function: SCr 1.07, CrCl = 30ml/min Hepatic Function: WNL  Pre-Albumin: ordered Triglycerides: ordered CBGs: no CBG checks yet entered, serum glucose < 150   Nutritional Goals:  2400 kCal, 100-120 grams of protein per day  Clinimix E 5/20 at 90 ml/hr + 20% fat emulsion at 10 ml/hr will provide 2380 kcal/d and 108 gm protein/d  TNA day: 0 TNA access: PICC line has been ordered  Plan:  At 1800 on 2/26 after central access has been established:  Start Clinimix E 5/20 at 40 ml/hr  Patient at moderate risk for refeeding with decreased PO intake PTA Fat emulsion at 10 ml/hr  TNA to contain start multivitamins and trace elements MWF only due to national backorder Continue IVF at 125 ml/hr per MD tonight  F/u RD recs inAM for IVF adjustment Initiate sensitive SSI q4h with q4h CBG checks 12/26  at 2000 TNA lab panels on Mondays & Thursdays  Thank you for the consult.  Johny Drilling, PharmD, BCPS Pager: [phone] Pharmacy: [phone] 11/01/2013 9:02 PM

## 2013-11-01 NOTE — Progress Notes (Signed)
Quick Note:  Plan will be for patient to go to [facility] ED for triage - Dr. Marcello Moores will see him there I called patient We will call Austin Robbins [facility] ED and let them know patient is coming after CT showed pelvic absecesses/rectal perforation ______

## 2013-11-01 NOTE — Progress Notes (Signed)
Peripherally Inserted Central Catheter/Midline Placement  The IV Nurse has discussed with the patient and/or persons authorized to consent for the patient, the purpose of this procedure and the potential benefits and risks involved with this procedure.  The benefits include less needle sticks, lab draws from the catheter and patient may be discharged home with the catheter.  Risks include, but not limited to, infection, bleeding, blood clot (thrombus formation), and puncture of an artery; nerve damage and irregular heat beat.  Alternatives to this procedure were also discussed.  PICC/Midline Placement Documentation  PICC / Midline Double Lumen 29/92/42 PICC Right Basilic 46 cm 0 cm (Active)       Edson Snowball 11/01/2013, 9:43 PM

## 2013-11-02 LAB — COMPREHENSIVE METABOLIC PANEL
ALBUMIN: 2 g/dL — AB (ref 3.5–5.2)
ALK PHOS: 81 U/L (ref 39–117)
ALT: 14 U/L (ref 0–53)
AST: 12 U/L (ref 0–37)
BUN: 9 mg/dL (ref 6–23)
CO2: 26 mEq/L (ref 19–32)
Calcium: 8.6 mg/dL (ref 8.4–10.5)
Chloride: 99 mEq/L (ref 96–112)
Creatinine, Ser: 0.67 mg/dL (ref 0.50–1.35)
GFR calc Af Amer: >90 mL/min (ref >90)
GFR calc non Af Amer: >90 mL/min (ref >90)
GLUCOSE: 137 mg/dL — AB (ref 70–99)
POTASSIUM: 4 meq/L (ref 3.7–5.3)
SODIUM: 135 meq/L — AB (ref 137–147)
TOTAL PROTEIN: 5.9 g/dL — AB (ref 6.0–8.3)
Total Bilirubin: 0.3 mg/dL (ref 0.3–1.2)

## 2013-11-02 LAB — ABO/RH: ABO/RH(D): A POS

## 2013-11-02 LAB — CBC
HCT: 21.7 % — ABNORMAL LOW (ref 39.0–52.0)
HEMOGLOBIN: 6.5 g/dL — AB (ref 13.0–17.0)
MCH: 21.7 pg — ABNORMAL LOW (ref 26.0–34.0)
MCHC: 30 g/dL (ref 30.0–36.0)
MCV: 72.3 fL — ABNORMAL LOW (ref 78.0–100.0)
PLATELETS: 396 10*3/uL (ref 150–400)
RBC: 3 MIL/uL — ABNORMAL LOW (ref 4.22–5.81)
RDW: 17.1 % — ABNORMAL HIGH (ref 11.5–15.5)
WBC: 9.4 10*3/uL (ref 4.0–10.5)

## 2013-11-02 LAB — DIFFERENTIAL
BASOS ABS: 0 10*3/uL (ref 0.0–0.1)
BASOS PCT: 0 % (ref 0–1)
Eosinophils Absolute: 0.1 10*3/uL (ref 0.0–0.7)
Eosinophils Relative: 1 % (ref 0–5)
Lymphocytes Relative: 13 % (ref 12–46)
Lymphs Abs: 1.2 10*3/uL (ref 0.7–4.0)
Monocytes Absolute: 0.7 10*3/uL (ref 0.1–1.0)
Monocytes Relative: 7 % (ref 3–12)
NEUTROS ABS: 7.4 10*3/uL (ref 1.7–7.7)
NEUTROS PCT: 79 % — AB (ref 43–77)

## 2013-11-02 LAB — TRIGLYCERIDES: TRIGLYCERIDES: 126 mg/dL (ref <150)

## 2013-11-02 LAB — PREPARE RBC (CROSSMATCH)

## 2013-11-02 LAB — GLUCOSE, CAPILLARY: Glucose-Capillary: 191 mg/dL — ABNORMAL HIGH (ref 70–99)

## 2013-11-02 LAB — MAGNESIUM: Magnesium: 2 mg/dL (ref 1.5–2.5)

## 2013-11-02 LAB — PREALBUMIN: PREALBUMIN: 6.1 mg/dL — AB (ref 17.0–34.0)

## 2013-11-02 LAB — PHOSPHORUS: Phosphorus: 3.2 mg/dL (ref 2.3–4.6)

## 2013-11-02 MED ORDER — PHENAZOPYRIDINE HCL 200 MG PO TABS
200.0000 mg | ORAL_TABLET | Freq: Once | ORAL | Status: AC
  Administered 2013-11-03: 200 mg via ORAL
  Filled 2013-11-02 (×2): qty 1

## 2013-11-02 MED ORDER — ALVIMOPAN 12 MG PO CAPS
12.0000 mg | ORAL_CAPSULE | ORAL | Status: AC
Start: 2013-11-03 — End: 2013-11-03
  Administered 2013-11-03: 12 mg via ORAL
  Filled 2013-11-02: qty 1

## 2013-11-02 MED ORDER — M.V.I. ADULT IV INJ
INTRAVENOUS | Status: AC
  Administered 2013-11-02: 17:00:00 via INTRAVENOUS
  Filled 2013-11-02: qty 1000

## 2013-11-02 MED ORDER — KCL IN DEXTROSE-NACL 20-5-0.45 MEQ/L-%-% IV SOLN
INTRAVENOUS | Status: DC
  Administered 2013-11-02 – 2013-11-03 (×2): via INTRAVENOUS
  Filled 2013-11-02 (×5): qty 1000

## 2013-11-02 MED ORDER — FAT EMULSION 20 % IV EMUL
240.0000 mL | INTRAVENOUS | Status: AC
  Administered 2013-11-02: 240 mL via INTRAVENOUS
  Filled 2013-11-02: qty 250

## 2013-11-02 MED ORDER — DEXTROSE 5 % IV SOLN
2.0000 g | INTRAVENOUS | Status: AC
  Administered 2013-11-03: 2 g via INTRAVENOUS
  Filled 2013-11-02 (×2): qty 2

## 2013-11-02 NOTE — Progress Notes (Signed)
Perforated rectum  Subjective: Pt feels somewhat better.  Fevers better on antibiotics.    Objective: Vital signs in last 24 hours: Temp:  [97.9 F (36.6 C)-101.1 F (38.4 C)] 97.9 F (36.6 C) (02/26 0529) Pulse Rate:  [72-99] 72 (02/26 0529) Resp:  [16-18] 18 (02/26 0529) BP: (106-126)/(55-69) 106/69 mmHg (02/26 0529) SpO2:  [95 %-97 %] 97 % (02/26 0529) Weight:  [199 lb 15.3 oz (90.7 kg)] 199 lb 15.3 oz (90.7 kg) (02/25 1922) Last BM Date:  (Prior to admission)  Intake/Output from previous day: 02/25 0701 - 02/26 0700 In: 1410.4 [I.V.:1410.4] Out: 1550 [Urine:1550] Intake/Output this shift:   General appearance: alert and cooperative GI: normal findings: soft, non-tender  Lab Results:  Results for orders placed during the hospital encounter of 11/01/13 (from the past 24 hour(s))  COMPREHENSIVE METABOLIC PANEL     Status: Abnormal   Collection Time    11/01/13  5:15 PM      Result Value Ref Range   Sodium 132 (*) 137 - 147 mEq/L   Potassium 4.1  3.7 - 5.3 mEq/L   Chloride 92 (*) 96 - 112 mEq/L   CO2 20  19 - 32 mEq/L   Glucose, Bld 101 (*) 70 - 99 mg/dL   BUN 15  6 - 23 mg/dL   Creatinine, Ser 1.07  0.50 - 1.35 mg/dL   Calcium 9.4  8.4 - 10.5 mg/dL   Total Protein 7.7  6.0 - 8.3 g/dL   Albumin 2.6 (*) 3.5 - 5.2 g/dL   AST 15  0 - 37 U/L   ALT 13  0 - 53 U/L   Alkaline Phosphatase 107  39 - 117 U/L   Total Bilirubin 0.5  0.3 - 1.2 mg/dL   GFR calc non Af Amer 75 (*) >90 mL/min   GFR calc Af Amer 87 (*) >90 mL/min  PROTIME-INR     Status: Abnormal   Collection Time    11/01/13  5:15 PM      Result Value Ref Range   Prothrombin Time 15.3 (*) 11.6 - 15.2 seconds   INR 1.24  0.00 - 1.49  APTT     Status: Abnormal   Collection Time    11/01/13  5:15 PM      Result Value Ref Range   aPTT 41 (*) 24 - 37 seconds  CBC WITH DIFFERENTIAL     Status: Abnormal   Collection Time    11/01/13  5:15 PM      Result Value Ref Range   WBC 17.5 (*) 4.0 - 10.5 K/uL   RBC  3.38 (*) 4.22 - 5.81 MIL/uL   Hemoglobin 7.4 (*) 13.0 - 17.0 g/dL   HCT 24.4 (*) 39.0 - 52.0 %   MCV 72.2 (*) 78.0 - 100.0 fL   MCH 21.9 (*) 26.0 - 34.0 pg   MCHC 30.3  30.0 - 36.0 g/dL   RDW 17.2 (*) 11.5 - 15.5 %   Platelets 512 (*) 150 - 400 K/uL   Neutrophils Relative % 80 (*) 43 - 77 %   Lymphocytes Relative 11 (*) 12 - 46 %   Monocytes Relative 9  3 - 12 %   Eosinophils Relative 0  0 - 5 %   Basophils Relative 0  0 - 1 %   Neutro Abs 14.0 (*) 1.7 - 7.7 K/uL   Lymphs Abs 1.9  0.7 - 4.0 K/uL   Monocytes Absolute 1.6 (*) 0.1 - 1.0 K/uL   Eosinophils  Absolute 0.0  0.0 - 0.7 K/uL   Basophils Absolute 0.0  0.0 - 0.1 K/uL   WBC Morphology TOXIC GRANULATION    TYPE AND SCREEN     Status: None   Collection Time    11/01/13  5:15 PM      Result Value Ref Range   ABO/RH(D) A POS     Antibody Screen NEG     Sample Expiration 11/04/2013     Unit Number [id number]     Blood Component Type RED CELLS,LR     Unit division 00     Status of Unit ALLOCATED     Transfusion Status OK TO TRANSFUSE     Crossmatch Result Compatible     Unit Number [id number]     Blood Component Type RED CELLS,LR     Unit division 00     Status of Unit ALLOCATED     Transfusion Status OK TO TRANSFUSE     Crossmatch Result Compatible    ABO/RH     Status: None   Collection Time    11/01/13  5:15 PM      Result Value Ref Range   ABO/RH(D) A POS    URINALYSIS, ROUTINE W REFLEX MICROSCOPIC     Status: Abnormal   Collection Time    11/01/13  5:32 PM      Result Value Ref Range   Color, Urine RED (*) YELLOW   APPearance TURBID (*) CLEAR   Specific Gravity, Urine >1.046 (*) 1.005 - 1.030   pH 6.0  5.0 - 8.0   Glucose, UA NEGATIVE  NEGATIVE mg/dL   Hgb urine dipstick LARGE (*) NEGATIVE   Bilirubin Urine NEGATIVE  NEGATIVE   Ketones, ur NEGATIVE  NEGATIVE mg/dL   Protein, ur >300 (*) NEGATIVE mg/dL   Urobilinogen, UA 1.0  0.0 - 1.0 mg/dL   Nitrite NEGATIVE  NEGATIVE   Leukocytes, UA LARGE (*)  NEGATIVE  URINE MICROSCOPIC-ADD ON     Status: None   Collection Time    11/01/13  5:32 PM      Result Value Ref Range   WBC, UA TOO NUMEROUS TO COUNT  <3 WBC/hpf   Urine-Other FIELD OBSCURED BY WBC'S    CBC     Status: Abnormal   Collection Time    11/01/13  8:30 PM      Result Value Ref Range   WBC 14.9 (*) 4.0 - 10.5 K/uL   RBC 3.13 (*) 4.22 - 5.81 MIL/uL   Hemoglobin 6.9 (*) 13.0 - 17.0 g/dL   HCT 22.3 (*) 39.0 - 52.0 %   MCV 71.2 (*) 78.0 - 100.0 fL   MCH 22.0 (*) 26.0 - 34.0 pg   MCHC 30.9  30.0 - 36.0 g/dL   RDW 17.3 (*) 11.5 - 15.5 %   Platelets 463 (*) 150 - 400 K/uL  CBC     Status: Abnormal   Collection Time    11/02/13  5:50 AM      Result Value Ref Range   WBC 9.4  4.0 - 10.5 K/uL   RBC 3.00 (*) 4.22 - 5.81 MIL/uL   Hemoglobin 6.5 (*) 13.0 - 17.0 g/dL   HCT 21.7 (*) 39.0 - 52.0 %   MCV 72.3 (*) 78.0 - 100.0 fL   MCH 21.7 (*) 26.0 - 34.0 pg   MCHC 30.0  30.0 - 36.0 g/dL   RDW 17.1 (*) 11.5 - 15.5 %   Platelets 396  150 - 400 K/uL  DIFFERENTIAL     Status: Abnormal   Collection Time    11/02/13  5:50 AM      Result Value Ref Range   Neutrophils Relative % 79 (*) 43 - 77 %   Neutro Abs 7.4  1.7 - 7.7 K/uL   Lymphocytes Relative 13  12 - 46 %   Lymphs Abs 1.2  0.7 - 4.0 K/uL   Monocytes Relative 7  3 - 12 %   Monocytes Absolute 0.7  0.1 - 1.0 K/uL   Eosinophils Relative 1  0 - 5 %   Eosinophils Absolute 0.1  0.0 - 0.7 K/uL   Basophils Relative 0  0 - 1 %   Basophils Absolute 0.0  0.0 - 0.1 K/uL   RBC Morphology POLYCHROMASIA PRESENT    COMPREHENSIVE METABOLIC PANEL     Status: Abnormal   Collection Time    11/02/13  5:50 AM      Result Value Ref Range   Sodium 135 (*) 137 - 147 mEq/L   Potassium 4.0  3.7 - 5.3 mEq/L   Chloride 99  96 - 112 mEq/L   CO2 26  19 - 32 mEq/L   Glucose, Bld 137 (*) 70 - 99 mg/dL   BUN 9  6 - 23 mg/dL   Creatinine, Ser 0.67  0.50 - 1.35 mg/dL   Calcium 8.6  8.4 - 10.5 mg/dL   Total Protein 5.9 (*) 6.0 - 8.3 g/dL   Albumin  2.0 (*) 3.5 - 5.2 g/dL   AST 12  0 - 37 U/L   ALT 14  0 - 53 U/L   Alkaline Phosphatase 81  39 - 117 U/L   Total Bilirubin 0.3  0.3 - 1.2 mg/dL   GFR calc non Af Amer >90  >90 mL/min   GFR calc Af Amer >90  >90 mL/min  MAGNESIUM     Status: None   Collection Time    11/02/13  5:50 AM      Result Value Ref Range   Magnesium 2.0  1.5 - 2.5 mg/dL  PHOSPHORUS     Status: None   Collection Time    11/02/13  5:50 AM      Result Value Ref Range   Phosphorus 3.2  2.3 - 4.6 mg/dL  PREPARE RBC (CROSSMATCH)     Status: None   Collection Time    11/02/13  9:00 AM      Result Value Ref Range   Order Confirmation ORDER PROCESSED BY BLOOD BANK       Studies/Results Radiology     MEDS, Scheduled . ertapenem  1 g Intravenous Q24H  . heparin  5,000 Units Subcutaneous 3 times per day  . insulin aspart  0-9 Units Subcutaneous 6 times per day  . pantoprazole (PROTONIX) IV  40 mg Intravenous QHS     Assessment: Perforated rectum Malnutrition, protein calorie deficient  Anemia of chronic disease  Plan: PICC and TPN to start today Will transfuse 2 u pRBCs today  Will have stoma RN mark for colostomy Plan for OR tom- LAR, pelvic washout and end colostomy Appreciate Urology assistance with this case    LOS: 1 day    Rosario Adie, MD [facility] Surgery, [place]   11/02/2013 9:04 AM

## 2013-11-02 NOTE — Progress Notes (Signed)
S:  Discussed with pt at length goals of surgery tomorrow as best-as-possible local cancer control with bowel diversion and the possible roles of Urology ranging from cysto / retrogrades / stents, to primary bladder repair of suspected inflammatory fistula tract, to even cystoprostatectomy with bowel urinary diversion if there is carcinoma found to be directly invading bladder neck / trigone / prostate area. We discussed in detail the possible short and long terms ramifications of such major surgery including permanat urinary diversion, stomal stenosis, ureteral stenosis, persistent fistula tract, cancer recurrence, impotence as well as bleeding / infection / damage to adjacent strucutres / MI / CVA / PE / DVT / Mortality.  Pt's primary goal is excellent local cancer control even if it does involve more radical pelvic exenteration.   O: NAD, Very pleasant, AOx3 RRR Non-labored breathing SNTND abdomen w/o hernias or surgical scars No palpable scrotal masses or perineal masses NO c/c/e  A/P:  1 - Locally Advanced Colon Cancer - consented / posted for surgery tomorrow as per above with both myself and Leighton Ruff. We reviewed his consent form in detail. I agree with Dr. Marcello Moores' and Dr. Dahlstedt's assessment and look forward to assisting in the care of this patient.

## 2013-11-02 NOTE — H&P (Signed)
ATTENDING ADDENDUM:  I personally reviewed patient's record, examined the patient, and formulated the following assessment and plan:  Agree with above note.  I have discussed with him and his family the significant risks of this surgery including bleeding, continued infection, damage to pelvic structures, need for bladder resection and damage to pelvic nerves that control sexual and bladder function.  All questions were answered.

## 2013-11-02 NOTE — Progress Notes (Addendum)
PARENTERAL NUTRITION CONSULT NOTE - INITIAL  Pharmacy Consult for TNA Indication: bowel obstruction  Allergies  Allergen Reactions  . Codeine Other (See Comments)    Chest pain    Patient Measurements: Height: 6' (182.9 cm) Weight: 199 lb 15.3 oz (90.7 kg) IBW/kg (Calculated) : 77.6 Adjusted Body Weight: 81.5 kg Usual Weight: 112 kg  Vital Signs: Temp: 97.9 F (36.6 C) (02/26 0905) Temp src: Oral (02/26 0905) BP: 101/61 mmHg (02/26 0905) Pulse Rate: 67 (02/26 0905) Intake/Output from previous day: 02/25 0701 - 02/26 0700 In: 1410.4 [I.V.:1410.4] Out: 1550 [Urine:1550]  Labs:  Recent Labs  11/01/13 1715 11/01/13 2030 11/02/13 0550  WBC 17.5* 14.9* 9.4  HGB 7.4* 6.9* 6.5*  HCT 24.4* 22.3* 21.7*  PLT 512* 463* 396  APTT 41*  --   --   INR 1.24  --   --      Recent Labs  11/01/13 1715 11/02/13 0550  NA 132* 135*  K 4.1 4.0  CL 92* 99  CO2 20 26  GLUCOSE 101* 137*  BUN 15 9  CREATININE 1.07 0.67  CALCIUM 9.4 8.6  MG  --  2.0  PHOS  --  3.2  PROT 7.7 5.9*  ALBUMIN 2.6* 2.0*  AST 15 12  ALT 13 14  ALKPHOS 107 81  BILITOT 0.5 0.3  TRIG  --  126   Estimated Creatinine Clearance: 111.8 ml/min (by C-G formula based on Cr of 0.67).   No results found for this basename: GLUCAP,  in the last 72 hours  CBGs & Insulin requirements past 24 hours:  - None checked, glucose okay on CMET  Nutritional Goals:  - RD recommendations: pending - Estimated needs: ~2400 Kcal, 100 -120 g protein per day  Current nutrition:  - Diet: Clear liquid starting 2/25 - TNA: to start 2/26 PM - mIVF: D5 1/2NS + KCl 20 mEq/L @ 125 ml/hr   Assessment:  12 yoM admitted 2/25 for further management of rectal carcinoma. The patient has had a 45-50 pound weight loss over the past few months. He also has a history of abdominal pain, loose bowel movements, abdominal distention and prostatitis. He underwent colonoscopy approximately a week ago by Dr. Silvano Rusk, and was found to  have rectal carcinoma. He had a CT scan performed today which revealed distended colon, extraluminal air within his pelvis, with his bladder indistinctly seen. Patient does not have a hx of diabetes. Pharmacy has been consulted to dose TNA for bowel obstruction.  2/26: PICC placed, TNA to begin tonight. Surgery tomorrow   Labs: Electrolytes:  Na+ 135, K+, Mag, Phos wnl Renal Function: Scr down to 0.67, UOP  Hepatic Function: wnl 2/26 Pre-Albumin: pending labs 2/26 Triglycerides: wnl 2/26 CBGs: no h/o DM, glucose ok on CMET   Plan:  At 1800 tonight  Start Clinimix E 5/20 @ 40 ml/hr  TNA to contain IV fat emulsion 20% at 10 ml/hr daily  Standard multivitamins daily,  trace elements MWF given national backorder  Reduce IVF to 85 ml/hr.  Start sensitive SSI q4h  TNA labs Monday/Thursdays  Pending dietician consult  Pharmacy will follow up daily   Vanessa [place], PharmD, BCPS Pager: [phone] 9:35 AM Pharmacy #: [phone]

## 2013-11-02 NOTE — Care Management Note (Addendum)
    Page 1 of 2   11/10/2013     2:23:16 PM   CARE MANAGEMENT NOTE 11/10/2013  Patient:  Austin Robbins, Austin Robbins   Account Number:  [id number]  Date Initiated:  11/02/2013  Documentation initiated by:  Sunday Spillers  Subjective/Objective Assessment:   58 yo male admitted with perforated rectum due to cancer, Multiple pelvic abscesses. PTA lived at home with spouse.     Action/Plan:   Home when stable   Anticipated DC Date:  11/10/2013   Anticipated DC Plan:  [place]  CM consult      PAC Choice  [place]   Choice offered to / List presented to:  C-3 Spouse   DME arranged  [facility]      DME agency  [facility]     HH arranged  HH-1 RN      [place].   Status of service:  Completed, signed off Medicare Important Message given?   (If response is "NO", the following Medicare IM given date fields will be blank) Date Medicare IM given:   Date Additional Medicare IM given:    Discharge Disposition:  [place]  Per UR Regulation:  Reviewed for med. necessity/level of care/duration of stay  If discussed at [place] of Stay Meetings, dates discussed:    Comments:  11-10-13 Sunday Spillers RN CM Contacted by [employer] that wound vac was not authorized, contacted Dr. Marcello Moores via the PA [facility] Per order, will d/c wound vac and d/c patient home with wet to dry dressing. Nurse and patient aware of change in plans.  11-10-13 Sunday Spillers RN CM Continue to fax information to [facility] for approval, status pending at this time.  11-08-13 Sunday Spillers RN CM Spoke with spouse per patient wishes regarding [facility] and choice for agency. [facility] was chosen, contacted them to notify of needs. [employer] application in process, no approval at this time. Awaiting final orders.

## 2013-11-02 NOTE — Progress Notes (Signed)
INITIAL NUTRITION ASSESSMENT  Pt meets criteria for severe MALNUTRITION in the context of chronic illness as evidenced by <75% estimated energy intake with 7.4% weight loss in the past month.  DOCUMENTATION CODES Per approved criteria  -Severe malnutrition in the context of chronic illness   INTERVENTION: - TPN per pharmacy - Will continue to monitor   NUTRITION DIAGNOSIS: Altered GI function related to perforated rectum with multiple pelvic abscesses as evidenced by MD notes.  Goal: TPN to meet >90% of estimated nutrition needs  Monitor:  Weights, labs, TPN  Reason for Assessment: Malnutrition screening tool, consult for TPN  58 y.o. male  Admitting Dx: Perforated rectum  ASSESSMENT: Pt admitted with multiple pelvic abscesses along with some extraluminal air consistent with a rectal perforation from his rectal adenocarcinoma. The patient states that over the last 2-3 months he's had an unintentional 45 pound weight loss. He has developed night sweats since December. He admits to having a change in his bowel habits starting in November. These became more "mushy" and liquid-like. He began having a decrease in his appetite; however, he would still be about 2 meals a day. He noticed a very small amount of blood on his toilet paper but never and over amount in the toilet or his stool. He began running fevers in December and has had fever since then with no cessation. He denies ever having a colonoscopy before last week. He also denies any family history of GI or rectal cancer. He currently denies any abdominal bloating, nausea, or vomiting. Plan is for TPN which will start today. Plan is for surgical resection of tumor with evacuation of infection in his pelvis and colostomy.   Met with pt who reports good appetite for the past 2-3 weeks however intake has been <75% energy needs. Pt eating things like grits/eggs for breakfast, a sandwich for lunch, and an occasional [employer]. Does not eat dinner. Pt reports in December 2014 he had some stomach problems where he lost his appetite and had to force himself to eat. Pt's only complaint this morning was groin pain. Denies any decreased muscle strength. Nutrition focused physical exam of upper body was WNL.   Height: Ht Readings from Last 1 Encounters:  11/01/13 6' (1.829 m)    Weight: Wt Readings from Last 1 Encounters:  11/01/13 199 lb 15.3 oz (90.7 kg)    Ideal Body Weight: 178 lb   % Ideal Body Weight: 112%  Wt Readings from Last 10 Encounters:  11/01/13 199 lb 15.3 oz (90.7 kg)  10/25/13 215 lb (97.523 kg)  10/16/13 208 lb (94.348 kg)  10/16/13 208 lb (94.348 kg)  10/06/13 215 lb 6 oz (97.693 kg)  10/03/13 215 lb (97.523 kg)  08/07/13 200 lb (90.719 kg)    Usual Body Weight: 244 lb   % Usual Body Weight: 81%  BMI:  Body mass index is 27.11 kg/(m^2).  Estimated Nutritional Needs: Kcal: 2200-2400 Protein: 120-140g Fluid: 2.2-2.4L/day  Skin: Intact  Diet Order: Clear Liquid  EDUCATION NEEDS: -No education needs identified at this time   Intake/Output Summary (Last 24 hours) at 11/02/13 1316 Last data filed at 11/02/13 1235  Gross per 24 hour  Intake 1865.84 ml  Output   1550 ml  Net 315.84 ml    Last BM: PTA  Labs:   Recent Labs Lab 11/01/13 1715 11/02/13 0550  NA 132* 135*  K 4.1 4.0  CL 92* 99  CO2 20 26  BUN 15 9  CREATININE  1.07 0.67  CALCIUM 9.4 8.6  MG  --  2.0  PHOS  --  3.2  GLUCOSE 101* 137*    CBG (last 3)  No results found for this basename: GLUCAP,  in the last 72 hours  Scheduled Meds: . ertapenem  1 g Intravenous Q24H  . heparin  5,000 Units Subcutaneous 3 times per day  . insulin aspart  0-9 Units Subcutaneous 6 times per day  . pantoprazole (PROTONIX) IV  40 mg Intravenous QHS  . [START ON 11/03/2013] phenazopyridine  200 mg Oral Once    Continuous Infusions: . dextrose 5 % and 0.45 % NaCl with KCl 20 mEq/L 125 mL/hr at 11/02/13  1026  . dextrose 5 % and 0.45 % NaCl with KCl 20 mEq/L    . Marland KitchenTPN (CLINIMIX-E) Adult     And  . fat emulsion      Past Medical History  Diagnosis Date  . H. pylori infection 08/07/2013  . Anemia   . Gastritis   . Panic attacks   . Adenocarcinoma of rectum 10/25/2013    10/25/2013 colonoscopy  . Personal history of colonic polyps - adenoma 10/25/2013    Past Surgical History  Procedure Laterality Date  . Tonsillectomy      Mikey College MS, [place], [place] Pager [phone] After Hours Pager

## 2013-11-02 NOTE — Op Note (Signed)
Subjective: Patient reports that he feels about the same as yesterday. He is having some bladder spasms.  Objective: Vital signs in last 24 hours: Temp:  [97.8 F (36.6 C)-101.1 F (38.4 C)] 98.4 F (36.9 C) (02/26 1000) Pulse Rate:  [67-99] 69 (02/26 1000) Resp:  [16-18] 18 (02/26 1000) BP: (92-126)/(52-69) 110/65 mmHg (02/26 1000) SpO2:  [95 %-99 %] 99 % (02/26 1000) Weight:  [90.7 kg (199 lb 15.3 oz)] 90.7 kg (199 lb 15.3 oz) (02/25 1922)  Intake/Output from previous day: 02/25 0701 - 02/26 0700 In: 1410.4 [I.V.:1410.4] Out: 1550 [Urine:1550] Intake/Output this shift: Total I/O In: 132.5 [P.O.:120; Blood:12.5] Out: -   Physical Exam:  Constitutional: Vital signs reviewed. WD WN in NAD   Eyes: PERRL, No scleral icterus.   Pulmonary/Chest: Normal effort  Urine looks much clearer today. A Foley catheter has been placed. Lab Results:  Recent Labs  11/01/13 1715 11/01/13 2030 11/02/13 0550  HGB 7.4* 6.9* 6.5*  HCT 24.4* 22.3* 21.7*   BMET  Recent Labs  11/01/13 1715 11/02/13 0550  NA 132* 135*  K 4.1 4.0  CL 92* 99  CO2 20 26  GLUCOSE 101* 137*  BUN 15 9  CREATININE 1.07 0.67  CALCIUM 9.4 8.6    Recent Labs  11/01/13 1715  INR 1.24   No results found for this basename: LABURIN,  in the last 72 hours Results for orders placed during the hospital encounter of 10/16/13  CULTURE, BLOOD (ROUTINE X 2)     Status: None   Collection Time    10/16/13  7:12 PM      Result Value Ref Range Status   Specimen Description BLOOD RIGHT ANTECUBITAL   Final   Special Requests BOTTLES DRAWN AEROBIC AND ANAEROBIC [place]   Final   Culture NO GROWTH 5 DAYS   Final   Report Status 10/21/2013 FINAL   Final  CULTURE, BLOOD (ROUTINE X 2)     Status: None   Collection Time    10/16/13  7:13 PM      Result Value Ref Range Status   Specimen Description BLOOD LEFT ANTECUBITAL   Final   Special Requests BOTTLES DRAWN AEROBIC AND ANAEROBIC 10CC EACH   Final   Culture NO  GROWTH 5 DAYS   Final   Report Status 10/21/2013 FINAL   Final  URINE CULTURE     Status: None   Collection Time    10/16/13  8:21 PM      Result Value Ref Range Status   Specimen Description URINE, CLEAN CATCH   Final   Special Requests NONE   Final   Culture  Setup Time     Final   Value: 10/16/2013 21:00     Performed at [facility]     Final   Value: NO GROWTH     Performed at [facility]   Culture     Final   Value: NO GROWTH     Performed at [facility]   Report Status 10/18/2013 FINAL   Final    Studies/Results: Ct Chest W Contrast  11/01/2013   CLINICAL DATA:  Rectal mass on colonoscopy. Patient reports swelling in the left buttocks region  EXAM: CT CHEST, ABDOMEN, AND PELVIS WITH CONTRAST  TECHNIQUE: Multidetector CT imaging of the chest, abdomen and pelvis was performed following the standard protocol during bolus administration of intravenous contrast.  CONTRAST:  100 cc Omnipaque  COMPARISON:  None  FINDINGS:  CT CHEST FINDINGS  No axillary or supraclavicular lymphadenopathy. There are large round abnormal prevascular lymph nodes measuring up to 2.4 cm. Borderline enlarged subcarinal lymph node at 11 mm. Bilateral hilar adenopathy additionally.  Review of the lung parenchyma demonstrates no suspicious pulmonary nodules.  CT ABDOMEN AND PELVIS FINDINGS  No focal hepatic lesion. Gallbladder, pancreas, spleen, adrenal glands, and kidneys are normal. The stomach, small bowel, appendix, and cecum are normal.  There is a large volume stool in the ascending and transverse and descending colon. There is masslike thickening in the rectum. The tissue planes are poorly defined.  There is breakdown of the rectal wall wall evidence by several gas and is stool collections in the pelvis. One collection is inferior to the bladder elevates the bladder measuring measuring 6.0 x 5.5 cm. Cannot exclude involvement of the inferior wall the bladder by this  infectious/neoplastic process.  Another collection is in the left obturator space measuring 5.2 x 7.9 cm. Large collection extends into the left perineum with a 7.7 x 3.8 cm stool and gas collection. The process also extends to the superior base of the bladder with gas and stool.  There are multiple metastatic lymph nodes in the perirectal fat measuring up to 12 mm on both sides (image 106, series 2). Metastatic lymph node in the sigmoid mesial colon measures 15 mm (image 95, series similar lymph nodes on image 102.  No aggressive osseous lesion.  IMPRESSION: 1. Large rectal mass consistent rectal carcinoma. 2. Evidence of breakdown of the rectal wall with several large extraluminal gas and stool collections within the pelvis. 3. One collection elevates the bladder. Cannot exclude involvement interval of bladder. 4. Large abscess extends into the left perineum and the base of the penis. 5. Local nodal metastasis with multiple enlarged abnormal perirectal and mesocolonic nodes. 6. More distal metastasis with metastatic mediastinal and hilar lymph nodes. 7. Evidence of a partial bowel obstruction with significant amount of retained stool in the colon proximal to the sigmoid colon. Findings conveyed toCARL GESSNER on 11/01/2013  at12:34.   Electronically Signed   By: Suzy Bouchard M.D.   On: 11/01/2013 12:35   His bladder was irrigated, through his indwelling Foley catheter, with approximately 500 cc of sterile saline. No significant blood was present. Minimal pus was irrigated.  Following this, after patient consent and using sterile conditions (the patient was prepped and draped), a 19 French flexible cystoscope was passed under direct vision and was bladder. There were no urethral lesions or strictures. Prostate was nonobstructive. Bladder was entered. There was significant bladder spasm, preventing adequate distention of the bladder. However, the lateral walls, anterior bladder and posterior bladder appeared  normal. There was inflammatory reaction at the bladder neck, both anteriorly and posteriorly. The trigone was not significantly distorted, but because of bladder spasm, I was unable to identify the ureteral orifices. I tried for some time to allow the bladder to distend, this was impossible due to spasm. I did not see any specific fistula, but there was evident bladder neck inflammation that would not preclude this.  Following this, a 3 French Foley catheter was replaced. This was hooked to dependent drainage.  The patient tolerated the procedure well. Assessment/Plan:     advanced rectal carcinoma. The patient does have a bladder neck inflammation and a possible fistula from this. I do not see anything in the patient's bladder that would preclude an attempt at cystoscopy and stent placement. I have ordered the patient to get Pyridium a  bit before his procedure to assist in identifying the ureteral orifices during cystoscopic attempt. I have spoken to Dr. Phebe Colla who should be able to assist with this procedure tomorrow. I've also discussed this with the patient.  LOS: 1 day   Franchot Gallo M 11/02/2013, 10:24 AM

## 2013-11-02 NOTE — Anesthesia Preprocedure Evaluation (Addendum)
Anesthesia Evaluation  Patient identified by MRN, date of birth, ID band Patient awake    Reviewed: Allergy & Precautions, H&P , NPO status , Patient's Chart, lab work & pertinent test results  Airway Mallampati: II TM Distance: >3 FB Neck ROM: full    Dental no notable dental hx. (+) Teeth Intact, Dental Advisory Given   Pulmonary neg pulmonary ROS,  breath sounds clear to auscultation  Pulmonary exam normal       Cardiovascular Exercise Tolerance: Good negative cardio ROS  Rhythm:regular Rate:Normal     Neuro/Psych Anxiety Panic attacksnegative neurological ROS  negative psych ROS   GI/Hepatic negative GI ROS, Neg liver ROS,   Endo/Other  negative endocrine ROS  Renal/GU negative Renal ROS  negative genitourinary   Musculoskeletal   Abdominal   Peds  Hematology negative hematology ROS (+) anemia , hgb 6.5   Anesthesia Other Findings   Reproductive/Obstetrics negative OB ROS                          Anesthesia Physical Anesthesia Plan  ASA: II  Anesthesia Plan: General   Post-op Pain Management:    Induction: Intravenous  Airway Management Planned: Oral ETT  Additional Equipment:   Intra-op Plan:   Post-operative Plan: Extubation in OR  Informed Consent: I have reviewed the patients History and Physical, chart, labs and discussed the procedure including the risks, benefits and alternatives for the proposed anesthesia with the patient or authorized representative who has indicated his/her understanding and acceptance.   Dental Advisory Given  Plan Discussed with: CRNA and Surgeon  Anesthesia Plan Comments:         Anesthesia Quick Evaluation

## 2013-11-02 NOTE — Consult Note (Signed)
WOC ostomy consult note: Request for preoperative stoma site selection per Dr. Marcello Moores' request. Patient's abdomen assessed in the sitting, lying and standing positions.  Very little adiposity in the abdominal rectus muscle is easily palpated.  Two sites selected, one each in the right and left lower quadrants. Wife present for visit. Right side stoma site selected is 5.5cm to the right of the umbilicus and 3cm below. Left side stoma site selected is 6cm to left of the umbilicus and 3cm below. Luetta Nutting are made with a surgical site marking pen and a thin film transparent dressing. Patient and wife taught that [facility] will be a post operative resource in the event that a stoma is created in surgery and they indicate understanding.  They decline an educational booklet at this time stating that they have a lot to adjust to at the moment.  Reassured that they will have adequate time and resources for education post operatively. [facility] nursing team will not follow, but will remain available to this patient, the nursing, surgical and medical teams.  Please re-consult if a stoma is created or if the [facility] Nursing Team can be of further assistance. Thanks, Maudie Flakes, MSN, RN, [place], [place], [place] [phone])

## 2013-11-03 ENCOUNTER — Inpatient Hospital Stay (HOSPITAL_COMMUNITY): Payer: 59

## 2013-11-03 ENCOUNTER — Inpatient Hospital Stay (HOSPITAL_COMMUNITY): Payer: 59 | Admitting: Anesthesiology

## 2013-11-03 ENCOUNTER — Encounter (HOSPITAL_COMMUNITY): Payer: Self-pay | Admitting: Anesthesiology

## 2013-11-03 ENCOUNTER — Encounter (HOSPITAL_COMMUNITY)
Admission: EM | Disposition: A | Discharge status: Home Health | Payer: Self-pay | Source: Home / Self Care | Attending: General Surgery

## 2013-11-03 ENCOUNTER — Other Ambulatory Visit: Payer: Self-pay

## 2013-11-03 ENCOUNTER — Encounter (HOSPITAL_COMMUNITY): Payer: 59 | Admitting: Anesthesiology

## 2013-11-03 DIAGNOSIS — C189 Malignant neoplasm of colon, unspecified: Secondary | ICD-10-CM

## 2013-11-03 HISTORY — PX: CYSTOSCOPY WITH URETEROSCOPY AND STENT PLACEMENT: SHX6377

## 2013-11-03 HISTORY — DX: Malignant neoplasm of colon, unspecified: C18.9

## 2013-11-03 HISTORY — PX: BOWEL RESECTION: SHX1257

## 2013-11-03 HISTORY — PX: APPLICATION OF WOUND VAC: SHX5189

## 2013-11-03 LAB — URINE CULTURE: Special Requests: NORMAL

## 2013-11-03 LAB — GLUCOSE, CAPILLARY
GLUCOSE-CAPILLARY: 125 mg/dL — AB (ref 70–99)
Glucose-Capillary: 123 mg/dL — ABNORMAL HIGH (ref 70–99)
Glucose-Capillary: 125 mg/dL — ABNORMAL HIGH (ref 70–99)
Glucose-Capillary: 135 mg/dL — ABNORMAL HIGH (ref 70–99)
Glucose-Capillary: 230 mg/dL — ABNORMAL HIGH (ref 70–99)

## 2013-11-03 LAB — HEMOGLOBIN AND HEMATOCRIT, BLOOD
HCT: 26.4 % — ABNORMAL LOW (ref 39.0–52.0)
HEMOGLOBIN: 8.4 g/dL — AB (ref 13.0–17.0)

## 2013-11-03 LAB — PREPARE RBC (CROSSMATCH)

## 2013-11-03 LAB — SURGICAL PCR SCREEN
MRSA, PCR: NEGATIVE
Staphylococcus aureus: NEGATIVE

## 2013-11-03 SURGERY — RESECTION, RECTUM, LOW ANTERIOR
Anesthesia: General | Site: Penis

## 2013-11-03 SURGERY — CYSTOURETEROSCOPY, WITH STENT INSERTION
Anesthesia: General

## 2013-11-03 MED ORDER — SODIUM CHLORIDE 0.9 % IJ SOLN
INTRAMUSCULAR | Status: AC
  Filled 2013-11-03: qty 10

## 2013-11-03 MED ORDER — SUFENTANIL CITRATE 50 MCG/ML IV SOLN
INTRAVENOUS | Status: AC
  Filled 2013-11-03: qty 1

## 2013-11-03 MED ORDER — LACTATED RINGERS IV SOLN: INTRAVENOUS | Status: DC

## 2013-11-03 MED ORDER — HYDROMORPHONE HCL PF 1 MG/ML IJ SOLN
INTRAMUSCULAR | Status: DC | PRN
  Administered 2013-11-03 (×2): 1 mg via INTRAVENOUS

## 2013-11-03 MED ORDER — PROPOFOL 10 MG/ML IV BOLUS
INTRAVENOUS | Status: AC
  Filled 2013-11-03: qty 20

## 2013-11-03 MED ORDER — HYDROMORPHONE 0.3 MG/ML IV SOLN
INTRAVENOUS | Status: DC
  Administered 2013-11-04: 4.02 mg via INTRAVENOUS
  Administered 2013-11-04: 3.42 mg via INTRAVENOUS
  Administered 2013-11-04: 8.69 mg via INTRAVENOUS
  Administered 2013-11-04: 2.85 mg via INTRAVENOUS
  Administered 2013-11-04 (×2): via INTRAVENOUS
  Administered 2013-11-05: 2.87 mg via INTRAVENOUS
  Administered 2013-11-05 (×2): via INTRAVENOUS
  Administered 2013-11-05: 5.1 mg via INTRAVENOUS
  Administered 2013-11-05: 1.92 mg via INTRAVENOUS
  Administered 2013-11-05: 5.1 mg via INTRAVENOUS
  Administered 2013-11-05: 2 mg via INTRAVENOUS
  Administered 2013-11-05: 02:00:00 via INTRAVENOUS
  Administered 2013-11-06: 1.2 mg via INTRAVENOUS
  Administered 2013-11-06: 5.23 mg via INTRAVENOUS
  Administered 2013-11-06: 0.3 mg via INTRAVENOUS
  Administered 2013-11-06: 2.1 mg via INTRAVENOUS
  Administered 2013-11-06: 08:00:00 via INTRAVENOUS
  Administered 2013-11-06: 1.5 mg via INTRAVENOUS
  Administered 2013-11-07: 1.2 mg via INTRAVENOUS
  Administered 2013-11-07: 0.9 mg via INTRAVENOUS
  Administered 2013-11-07: 1.8 mg via INTRAVENOUS
  Administered 2013-11-07: 2.7 mg via INTRAVENOUS
  Administered 2013-11-07: 0.8 mg via INTRAVENOUS
  Administered 2013-11-07: 1.8 mg via INTRAVENOUS
  Filled 2013-11-03 (×9): qty 25

## 2013-11-03 MED ORDER — INSULIN ASPART 100 UNIT/ML ~~LOC~~ SOLN
SUBCUTANEOUS | Status: AC
  Filled 2013-11-03: qty 1

## 2013-11-03 MED ORDER — DEXTROSE 10 % IV SOLN
INTRAVENOUS | Status: DC | PRN
  Filled 2013-11-03: qty 1000

## 2013-11-03 MED ORDER — HYDROMORPHONE HCL PF 1 MG/ML IJ SOLN: 0.2500 mg | INTRAMUSCULAR | Status: DC | PRN

## 2013-11-03 MED ORDER — PROPOFOL 10 MG/ML IV BOLUS
INTRAVENOUS | Status: DC | PRN
  Administered 2013-11-03: 180 mg via INTRAVENOUS

## 2013-11-03 MED ORDER — DEXAMETHASONE SODIUM PHOSPHATE 10 MG/ML IJ SOLN
INTRAMUSCULAR | Status: DC | PRN
  Administered 2013-11-03: 10 mg via INTRAVENOUS

## 2013-11-03 MED ORDER — NALOXONE HCL 0.4 MG/ML IJ SOLN: 0.4000 mg | INTRAMUSCULAR | Status: DC | PRN

## 2013-11-03 MED ORDER — ONDANSETRON HCL 4 MG/2ML IJ SOLN
INTRAMUSCULAR | Status: DC | PRN
Start: 2013-11-03 — End: 2013-11-03
  Administered 2013-11-03: 4 mg via INTRAVENOUS

## 2013-11-03 MED ORDER — MIDAZOLAM HCL 2 MG/2ML IJ SOLN
INTRAMUSCULAR | Status: AC
  Filled 2013-11-03: qty 2

## 2013-11-03 MED ORDER — FAT EMULSION 20 % IV EMUL
240.0000 mL | INTRAVENOUS | Status: AC
  Administered 2013-11-03: 240 mL via INTRAVENOUS
  Filled 2013-11-03: qty 250

## 2013-11-03 MED ORDER — TRACE MINERALS CR-CU-F-FE-I-MN-MO-SE-ZN IV SOLN
INTRAVENOUS | Status: DC
  Filled 2013-11-03: qty 1000

## 2013-11-03 MED ORDER — CISATRACURIUM BESYLATE 20 MG/10ML IV SOLN
INTRAVENOUS | Status: AC
  Filled 2013-11-03: qty 10

## 2013-11-03 MED ORDER — SODIUM CHLORIDE 0.9 % IV SOLN
1.0000 g | INTRAVENOUS | Status: AC
  Administered 2013-11-04 – 2013-11-10 (×7): 1 g via INTRAVENOUS
  Filled 2013-11-03 (×8): qty 1

## 2013-11-03 MED ORDER — GLYCOPYRROLATE 0.2 MG/ML IJ SOLN
INTRAMUSCULAR | Status: AC
  Filled 2013-11-03: qty 1

## 2013-11-03 MED ORDER — LIDOCAINE HCL (CARDIAC) 20 MG/ML IV SOLN
INTRAVENOUS | Status: AC
  Filled 2013-11-03: qty 5

## 2013-11-03 MED ORDER — IOHEXOL 300 MG/ML  SOLN
INTRAMUSCULAR | Status: DC | PRN
  Administered 2013-11-03: 10 mL via URETHRAL

## 2013-11-03 MED ORDER — SUFENTANIL CITRATE 50 MCG/ML IV SOLN
INTRAVENOUS | Status: DC | PRN
  Administered 2013-11-03 (×2): 10 ug via INTRAVENOUS
  Administered 2013-11-03: 5 ug via INTRAVENOUS
  Administered 2013-11-03: 10 ug via INTRAVENOUS
  Administered 2013-11-03: 5 ug via INTRAVENOUS
  Administered 2013-11-03 (×7): 10 ug via INTRAVENOUS
  Administered 2013-11-03: 20 ug via INTRAVENOUS
  Administered 2013-11-03: 10 ug via INTRAVENOUS
  Administered 2013-11-03 (×2): 5 ug via INTRAVENOUS

## 2013-11-03 MED ORDER — KETAMINE HCL 10 MG/ML IJ SOLN
INTRAMUSCULAR | Status: AC
  Filled 2013-11-03: qty 1

## 2013-11-03 MED ORDER — NEOSTIGMINE METHYLSULFATE 1 MG/ML IJ SOLN
INTRAMUSCULAR | Status: DC | PRN
  Administered 2013-11-03: 4 mg via INTRAVENOUS

## 2013-11-03 MED ORDER — CISATRACURIUM BESYLATE (PF) 10 MG/5ML IV SOLN
INTRAVENOUS | Status: DC | PRN
  Administered 2013-11-03: 6 mg via INTRAVENOUS
  Administered 2013-11-03 (×2): 2 mg via INTRAVENOUS
  Administered 2013-11-03: 6 mg via INTRAVENOUS
  Administered 2013-11-03: 20 mg via INTRAVENOUS
  Administered 2013-11-03 (×4): 4 mg via INTRAVENOUS
  Administered 2013-11-03: 2 mg via INTRAVENOUS
  Administered 2013-11-03: 4 mg via INTRAVENOUS
  Administered 2013-11-03: 10 mg via INTRAVENOUS
  Administered 2013-11-03 (×2): 6 mg via INTRAVENOUS

## 2013-11-03 MED ORDER — DEXAMETHASONE SODIUM PHOSPHATE 10 MG/ML IJ SOLN
INTRAMUSCULAR | Status: AC
  Filled 2013-11-03: qty 1

## 2013-11-03 MED ORDER — FAT EMULSION 20 % IV EMUL
240.0000 mL | INTRAVENOUS | Status: DC
  Filled 2013-11-03: qty 250

## 2013-11-03 MED ORDER — TRACE MINERALS CR-CU-F-FE-I-MN-MO-SE-ZN IV SOLN
INTRAVENOUS | Status: AC
  Administered 2013-11-03: 19:00:00 via INTRAVENOUS
  Filled 2013-11-03: qty 2000

## 2013-11-03 MED ORDER — LACTATED RINGERS IV SOLN
INTRAVENOUS | Status: AC
  Administered 2013-11-04 (×2): via INTRAVENOUS

## 2013-11-03 MED ORDER — ONDANSETRON HCL 4 MG/2ML IJ SOLN
INTRAMUSCULAR | Status: AC
  Filled 2013-11-03: qty 2

## 2013-11-03 MED ORDER — HYDROMORPHONE HCL PF 2 MG/ML IJ SOLN
INTRAMUSCULAR | Status: AC
  Filled 2013-11-03: qty 1

## 2013-11-03 MED ORDER — KETAMINE HCL 10 MG/ML IJ SOLN
INTRAMUSCULAR | Status: DC | PRN
  Administered 2013-11-03: 10 mg via INTRAVENOUS
  Administered 2013-11-03: 30 mg via INTRAVENOUS
  Administered 2013-11-03 (×8): 10 mg via INTRAVENOUS

## 2013-11-03 MED ORDER — ARTIFICIAL TEARS OP OINT
TOPICAL_OINTMENT | OPHTHALMIC | Status: DC | PRN
  Administered 2013-11-03: 1 via OPHTHALMIC

## 2013-11-03 MED ORDER — LIDOCAINE HCL (CARDIAC) 20 MG/ML IV SOLN
INTRAVENOUS | Status: DC | PRN
  Administered 2013-11-03: 100 mg via INTRAVENOUS

## 2013-11-03 MED ORDER — FAT EMULSION 20 % INFUSION TNA - OPTIME
INTRAVENOUS | Status: DC | PRN
  Administered 2013-11-03: 10 mL/h via INTRAVENOUS

## 2013-11-03 MED ORDER — PHENYLEPHRINE HCL 10 MG/ML IJ SOLN
INTRAMUSCULAR | Status: AC
  Filled 2013-11-03: qty 1

## 2013-11-03 MED ORDER — INSULIN ASPART 100 UNIT/ML ~~LOC~~ SOLN
0.0000 [IU] | SUBCUTANEOUS | Status: DC
  Administered 2013-11-04: 2 [IU] via SUBCUTANEOUS
  Administered 2013-11-04: 1 [IU] via SUBCUTANEOUS
  Administered 2013-11-04: 7 [IU] via SUBCUTANEOUS
  Administered 2013-11-04: 2 [IU] via SUBCUTANEOUS
  Administered 2013-11-04: 1 [IU] via SUBCUTANEOUS
  Administered 2013-11-04: 5 [IU] via SUBCUTANEOUS
  Administered 2013-11-05 (×3): 2 [IU] via SUBCUTANEOUS

## 2013-11-03 MED ORDER — ONDANSETRON HCL 4 MG/2ML IJ SOLN: 4.0000 mg | Freq: Four times a day (QID) | INTRAMUSCULAR | Status: DC | PRN

## 2013-11-03 MED ORDER — ENOXAPARIN SODIUM 40 MG/0.4ML ~~LOC~~ SOLN
40.0000 mg | SUBCUTANEOUS | Status: DC
  Administered 2013-11-04 – 2013-11-10 (×7): 40 mg via SUBCUTANEOUS
  Filled 2013-11-03 (×10): qty 0.4

## 2013-11-03 MED ORDER — LACTATED RINGERS IV SOLN
INTRAVENOUS | Status: DC | PRN
  Administered 2013-11-03 (×5): via INTRAVENOUS

## 2013-11-03 MED ORDER — LACTATED RINGERS IV SOLN
INTRAVENOUS | Status: DC | PRN
  Administered 2013-11-03 (×3): via INTRAVENOUS

## 2013-11-03 MED ORDER — EPHEDRINE SULFATE 50 MG/ML IJ SOLN
INTRAMUSCULAR | Status: AC
  Filled 2013-11-03: qty 1

## 2013-11-03 MED ORDER — MIDAZOLAM HCL 5 MG/5ML IJ SOLN
INTRAMUSCULAR | Status: DC | PRN
  Administered 2013-11-03: 2 mg via INTRAVENOUS

## 2013-11-03 MED ORDER — SODIUM CHLORIDE 0.9 % IJ SOLN: 9.0000 mL | INTRAMUSCULAR | Status: DC | PRN

## 2013-11-03 MED ORDER — GLYCOPYRROLATE 0.2 MG/ML IJ SOLN
INTRAMUSCULAR | Status: DC | PRN
  Administered 2013-11-03: 0.6 mg via INTRAVENOUS

## 2013-11-03 MED ORDER — ALVIMOPAN 12 MG PO CAPS
12.0000 mg | ORAL_CAPSULE | Freq: Two times a day (BID) | ORAL | Status: DC
  Administered 2013-11-04 – 2013-11-07 (×8): 12 mg via ORAL
  Filled 2013-11-03 (×10): qty 1

## 2013-11-03 MED ORDER — DIPHENHYDRAMINE HCL 12.5 MG/5ML PO ELIX: 12.5000 mg | ORAL_SOLUTION | Freq: Four times a day (QID) | ORAL | Status: DC | PRN

## 2013-11-03 MED ORDER — HYDROMORPHONE 0.3 MG/ML IV SOLN
INTRAVENOUS | Status: AC
  Administered 2013-11-03: 0.3 mg
  Filled 2013-11-03: qty 25

## 2013-11-03 MED ORDER — NEOSTIGMINE METHYLSULFATE 1 MG/ML IJ SOLN
INTRAMUSCULAR | Status: AC
  Filled 2013-11-03: qty 10

## 2013-11-03 MED ORDER — SODIUM CHLORIDE 0.9 % IV SOLN
INTRAVENOUS | Status: DC | PRN
  Administered 2013-11-03: 10:00:00 via INTRAVENOUS

## 2013-11-03 MED ORDER — DIPHENHYDRAMINE HCL 50 MG/ML IJ SOLN: 12.5000 mg | Freq: Four times a day (QID) | INTRAMUSCULAR | Status: DC | PRN

## 2013-11-03 SURGICAL SUPPLY — 88 items
ADAPTER GOLDBERG URETERAL (ADAPTER) ×4 IMPLANT
BAG URO CATCHER STRL LF (DRAPE) ×4 IMPLANT
BASKET LASER NITINOL 1.9FR (BASKET) IMPLANT
BLADE EXTENDED COATED 6.5IN (ELECTRODE) ×8 IMPLANT
BLADE HEX COATED 2.75 (ELECTRODE) ×8 IMPLANT
CANISTER SUCTION 2500CC (MISCELLANEOUS) IMPLANT
CATH FOLEY 3WAY 30CC 22FR (CATHETERS) ×4 IMPLANT
CATH INTERMIT  6FR 70CM (CATHETERS) ×4 IMPLANT
CATH URET 5FR 28IN OPEN ENDED (CATHETERS) ×8 IMPLANT
CHLORAPREP W/TINT 26ML (MISCELLANEOUS) ×4 IMPLANT
CLIP LIGATING HEMO O LOK GREEN (MISCELLANEOUS) ×4 IMPLANT
CLIP LIGATING HEMOLOK MED (MISCELLANEOUS) ×8 IMPLANT
CLIP TI MEDIUM 6 (CLIP) ×4 IMPLANT
CLOTH BEACON ORANGE TIMEOUT ST (SAFETY) ×4 IMPLANT
COVER MAYO STAND STRL (DRAPES) ×8 IMPLANT
DRAIN CHANNEL RND F F (WOUND CARE) ×4 IMPLANT
DRAPE C-ARM 42X120 X-RAY (DRAPES) IMPLANT
DRAPE CAMERA CLOSED 9X96 (DRAPES) ×4 IMPLANT
DRAPE LAPAROSCOPIC ABDOMINAL (DRAPES) ×4 IMPLANT
DRAPE LG THREE QUARTER DISP (DRAPES) ×4 IMPLANT
DRAPE WARM FLUID 44X44 (DRAPE) ×12 IMPLANT
DRSG VAC ATS LRG SENSATRAC (GAUZE/BANDAGES/DRESSINGS) ×4 IMPLANT
ELECT REM PT RETURN 9FT ADLT (ELECTROSURGICAL) ×4
ELECTRODE REM PT RTRN 9FT ADLT (ELECTROSURGICAL) ×2 IMPLANT
EVACUATOR DRAINAGE 10X20 100CC (DRAIN) ×2 IMPLANT
EVACUATOR SILICONE 100CC (DRAIN) ×2
GAUZE SPONGE 4X4 16PLY XRAY LF (GAUZE/BANDAGES/DRESSINGS) ×8 IMPLANT
GLOVE BIO SURGEON STRL SZ 6.5 (GLOVE) ×12 IMPLANT
GLOVE BIO SURGEONS STRL SZ 6.5 (GLOVE) ×4
GLOVE BIOGEL M STRL SZ7.5 (GLOVE) ×16 IMPLANT
GLOVE BIOGEL PI IND STRL 7.0 (GLOVE) ×14 IMPLANT
GLOVE BIOGEL PI INDICATOR 7.0 (GLOVE) ×14
GLOVE SURG SS PI 6.5 STRL IVOR (GLOVE) ×8 IMPLANT
GLOVE SURG SS PI 7.5 STRL IVOR (GLOVE) ×8 IMPLANT
GOWN SPEC L3 XXLG W/TWL (GOWN DISPOSABLE) ×4 IMPLANT
GOWN STRL REUS W/TWL 2XL LVL3 (GOWN DISPOSABLE) ×8 IMPLANT
GOWN STRL REUS W/TWL LRG LVL3 (GOWN DISPOSABLE) ×16 IMPLANT
GOWN STRL REUS W/TWL XL LVL3 (GOWN DISPOSABLE) ×20 IMPLANT
GUIDEWIRE ANG ZIPWIRE 038X150 (WIRE) ×12 IMPLANT
GUIDEWIRE STR DUAL SENSOR (WIRE) ×4 IMPLANT
KIT BASIN OR (CUSTOM PROCEDURE TRAY) ×8 IMPLANT
LEGGING LITHOTOMY PAIR STRL (DRAPES) ×8 IMPLANT
LIGASURE IMPACT 36 18CM CVD LR (INSTRUMENTS) ×8 IMPLANT
LOOP VESSEL MAXI BLUE (MISCELLANEOUS) ×4 IMPLANT
LUBRICANT JELLY K Y 4OZ (MISCELLANEOUS) ×4 IMPLANT
MANIFOLD NEPTUNE II (INSTRUMENTS) ×12 IMPLANT
NS IRRIG 1000ML POUR BTL (IV SOLUTION) ×24 IMPLANT
PACK CYSTO (CUSTOM PROCEDURE TRAY) ×4 IMPLANT
PACK GENERAL/GYN (CUSTOM PROCEDURE TRAY) ×8 IMPLANT
PLUG CATH AND CAP STER (CATHETERS) ×4 IMPLANT
SPONGE DRAIN TRACH 4X4 STRL 2S (GAUZE/BANDAGES/DRESSINGS) ×4 IMPLANT
SPONGE GAUZE 4X4 12PLY (GAUZE/BANDAGES/DRESSINGS) ×4 IMPLANT
SPONGE LAP 18X18 X RAY DECT (DISPOSABLE) ×28 IMPLANT
STAPLER CUT CVD 40MM GREEN (STAPLE) ×4 IMPLANT
STAPLER PROXIMATE 75MM BLUE (STAPLE) ×4 IMPLANT
STAPLER VISISTAT 35W (STAPLE) ×4 IMPLANT
STENT SET URETHERAL LEFT 7FR (STENTS) ×4 IMPLANT
STENT SET URETHERAL RIGHT 7FR (STENTS) ×4 IMPLANT
SUCTION POOLE TIP (SUCTIONS) ×4 IMPLANT
SUT CHROMIC 3 0 SH 27 (SUTURE) ×4 IMPLANT
SUT ETHILON 3 0 PS 1 (SUTURE) ×4 IMPLANT
SUT PDS AB 0 CTX 60 (SUTURE) ×8 IMPLANT
SUT PDS AB 1 CTX 36 (SUTURE) IMPLANT
SUT PDS AB 1 TP1 96 (SUTURE) IMPLANT
SUT PROLENE 2 0 BLUE (SUTURE) IMPLANT
SUT SILK 2 0 (SUTURE) ×2
SUT SILK 2 0 SH CR/8 (SUTURE) ×4 IMPLANT
SUT SILK 2 0SH CR/8 30 (SUTURE) IMPLANT
SUT SILK 2-0 18XBRD TIE 12 (SUTURE) ×2 IMPLANT
SUT SILK 2-0 30XBRD TIE 12 (SUTURE) IMPLANT
SUT SILK 3 0 (SUTURE) ×4
SUT SILK 3 0 SH CR/8 (SUTURE) ×4 IMPLANT
SUT SILK 3-0 18XBRD TIE 12 (SUTURE) ×4 IMPLANT
SUT VIC AB 2-0 SH 18 (SUTURE) ×8 IMPLANT
SUT VIC AB 2-0 UR6 27 (SUTURE) ×20 IMPLANT
SUT VIC AB 3-0 SH 27 (SUTURE) ×4
SUT VIC AB 3-0 SH 27X BRD (SUTURE) ×4 IMPLANT
SUT VIC AB 4-0 RB1 27 (SUTURE) ×14
SUT VIC AB 4-0 RB1 27XBRD (SUTURE) ×14 IMPLANT
SYR 30ML LL (SYRINGE) ×8 IMPLANT
SYSTEM UROSTOMY GENTLE TOUCH (WOUND CARE) ×4 IMPLANT
TAPE CLOTH 4X10 WHT NS (GAUZE/BANDAGES/DRESSINGS) ×4 IMPLANT
TOWEL OR 17X26 10 PK STRL BLUE (TOWEL DISPOSABLE) ×8 IMPLANT
TRAY FOLEY CATH 14FRSI W/METER (CATHETERS) IMPLANT
TUBE FEEDING 8FR 16IN STR KANG (MISCELLANEOUS) IMPLANT
TUBING CONNECTING 10 (TUBING) ×3 IMPLANT
TUBING CONNECTING 10' (TUBING) ×1
WATER STERILE IRR 1500ML POUR (IV SOLUTION) ×20 IMPLANT

## 2013-11-03 NOTE — Interval H&P Note (Signed)
History and Physical Interval Note:  11/03/2013 8:47 AM  Austin Robbins  has presented today for surgery, with the diagnosis of bowel resection with stent placement  The various methods of treatment have been discussed with the patient and family. After consideration of risks, benefits and other options for treatment, the patient has consented to  Procedure(s): OPEN LOW ANTERIOR BOWEL RESECTION (N/A) CYSTOSCOPY WITH URETEROSCOPY AND STENT PLACEMENT (N/A) as a surgical intervention .  The patient's history has been reviewed, patient examined, no change in status, stable for surgery.  I have reviewed the patient's chart and labs.  Questions were answered to the patient's satisfaction.     Rosario Adie, MD  [facility]

## 2013-11-03 NOTE — Preoperative (Signed)
Beta Blockers   Reason not to administer Beta Blockers:Not Applicable 

## 2013-11-03 NOTE — Progress Notes (Signed)
PARENTERAL NUTRITION CONSULT NOTE - INITIAL  Pharmacy Consult for TNA Indication: bowel obstruction  Allergies  Allergen Reactions  . Codeine Other (See Comments)    Chest pain    Patient Measurements: Height: 6' (182.9 cm) Weight: 199 lb 15.3 oz (90.7 kg) IBW/kg (Calculated) : 77.6 Adjusted Body Weight: 81.5 kg Usual Weight: 112 kg  Vital Signs: Temp: 99.3 F (37.4 C) (02/27 0600) Temp src: Oral (02/27 0600) BP: 120/76 mmHg (02/27 0600) Pulse Rate: 71 (02/27 0600) Intake/Output from previous day: 02/26 0701 - 02/27 0700 In: 4231.9 [P.O.:495; I.V.:2491.3; Blood:598.8; TPN:646.8] Out: 2775 [Urine:2775]  Labs:  Recent Labs  11/01/13 1715 11/01/13 2030 11/02/13 0550 11/03/13 0646  WBC 17.5* 14.9* 9.4  --   HGB 7.4* 6.9* 6.5* 8.4*  HCT 24.4* 22.3* 21.7* 26.4*  PLT 512* 463* 396  --   APTT 41*  --   --   --   INR 1.24  --   --   --      Recent Labs  11/01/13 1715 11/02/13 0550  NA 132* 135*  K 4.1 4.0  CL 92* 99  CO2 20 26  GLUCOSE 101* 137*  BUN 15 9  CREATININE 1.07 0.67  CALCIUM 9.4 8.6  MG  --  2.0  PHOS  --  3.2  PROT 7.7 5.9*  ALBUMIN 2.6* 2.0*  AST 15 12  ALT 13 14  ALKPHOS 107 81  BILITOT 0.5 0.3  PREALBUMIN  --  6.1*  TRIG  --  126   Estimated Creatinine Clearance: 111.8 ml/min (by C-G formula based on Cr of 0.67).    Recent Labs  11/03/13 0012 11/03/13 0411 11/03/13 0611  GLUCAP 123* 135* 125*    CBGs & Insulin requirements past 24 hours:   4 units   Nutritional Goals:  Per RD Rec's 2/26 Kcal: 2200-2400  Protein: 120-140g  Fluid: 2.2-2.4L/day  Clinimix E 5/20 at goal rate of 90 ml/hr with fat emulsion 20% daily at 10 ml/hr will provide 108 g protein and 2380kCal/day.    Current nutrition:  - Diet: Clear liquid starting 2/25 - TNA: to start 2/26 PM - mIVF: D5 1/2NS + KCl 20 mEq/L @ 125 ml/hr   Assessment:  65 yoM admitted 2/25 for further management of rectal carcinoma. The patient has had a 45-50 pound weight loss  over the past few months. He also has a history of abdominal pain, loose bowel movements, abdominal distention and prostatitis. He underwent colonoscopy approximately a week ago by Dr. Silvano Rusk, and was found to have rectal carcinoma. He had a CT scan performed today which revealed distended colon, extraluminal air within his pelvis, with his bladder indistinctly seen. Patient does not have a hx of diabetes. Pharmacy has been consulted to dose TNA for bowel obstruction.  2/26: PICC placed and TNA started   Surgical resection of tumor with evacuation of infection in pelvis and colostomy planned for 2/27   Labs: Electrolytes:  Na+ 135, K+, Mag, Phos wnl from 2/26 labs Renal Function: Scr down to 0.67, UOP  Hepatic Function: wnl 2/26 Pre-Albumin: 2/26 it was 6.1   Triglycerides: wnl 2/26 CBGs: no h/o DM, CBG's below goal < 150    Plan:  At 1800 tonight  Advance Clinimix E 5/20 to 60 ml/hr  TNA to contain IV fat emulsion 20% at 10 ml/hr daily  Standard multivitamins daily,  trace elements MWF given national backorder  Reduce IVF to 65 ml/hr  Continue sensitive SSI q4h  TNA labs Monday/Thursdays  BorgerdingGaye Alken PharmD Pager #: [phone] 7:43 AM 11/03/2013

## 2013-11-03 NOTE — Anesthesia Procedure Notes (Signed)
Procedure Name: Intubation Date/Time: 11/03/2013 9:26 AM Performed by: Danley Danker L Patient Re-evaluated:Patient Re-evaluated prior to inductionOxygen Delivery Method: Circle system utilized Preoxygenation: Pre-oxygenation with 100% oxygen Intubation Type: IV induction Ventilation: Mask ventilation without difficulty and Oral airway inserted - appropriate to patient size Laryngoscope Size: Miller and 3 Grade View: Grade I Tube type: Subglottic suction tube Tube size: 8.0 mm Number of attempts: 1 Airway Equipment and Method: Stylet Placement Confirmation: ETT inserted through vocal cords under direct vision,  breath sounds checked- equal and bilateral and positive ETCO2 Secured at: 21 cm Tube secured with: Tape Dental Injury: Teeth and Oropharynx as per pre-operative assessment

## 2013-11-03 NOTE — Transfer of Care (Signed)
Immediate Anesthesia Transfer of Care Note  Patient: Austin Robbins  Procedure(s) Performed: Procedure(s) with comments: OPEN ENCOLOSTOMY /COLON RESECTION/COLOSTOMY (N/A) CYSTOSCOPY WITH RIGHT RETROGRADE,RIGHT bladder biopsy,cystectomy and prostatectomy,TOTAL PROSTATE WITH ENBLOCK CYSTECTOMY AND PROSTATECTOMY/COLON CONDUIT URINARY DIVERSION/INSERTION BILATERAL STENTS/ILEOSTOMY (Bilateral) - bladder biopsy APPLICATION OF WOUND VAC (N/A)  Patient Location: PACU  Anesthesia Type:General  Level of Consciousness: awake, alert , oriented and patient cooperative  Airway & Oxygen Therapy: Patient Spontanous Breathing and Patient connected to face mask oxygen  Post-op Assessment: Report given to PACU RN, Post -op Vital signs reviewed and stable and Patient moving all extremities X 4  Post vital signs: stable  Complications: No apparent anesthesia complications

## 2013-11-03 NOTE — Progress Notes (Signed)
The surgery and anatomy were described to the patient as well as the risks of surgery and the possible complications.  These include: Bleeding, infection and possible wound complications such as hernia, damage to adjacent structures, leak of surgical connections, which can lead to other surgeries, possible need for other procedures, such as abscess drains in radiology, possible prolonged hospital stay, possible diarrhea from removal of part of the colon, possible constipation from narcotics, prolonged fatigue/weakness or appetite loss, possible early recurrence of cancer, possible complications of their medical problems such as heart disease or arrhythmias or lung problems, death (less than 1%).  I believe the patient understands and wishes to proceed with the surgery.

## 2013-11-03 NOTE — Brief Op Note (Signed)
11/01/2013 - 11/03/2013  6:57 PM  PATIENT:  Austin Robbins  58 y.o. male  PRE-OPERATIVE DIAGNOSIS:  Locally advanced colon cancer  POST-OPERATIVE DIAGNOSIS:  Locally advanced colon cancer  PROCEDURE:   Open cystoprostatectomy  - Leighton Ruff MD assist Open colon conduit urinary diversion - Felipa Furnace, PA assist Cystoscopy with right retrograde pyelogram + interpretation Bladder biopsy with fulgeration   ANESTHESIA:   general  EBL:  Total I/O In: 7590 [P.O.:20; I.V.:6900; Blood:670] Out: 1800 [Urine:550; Drains:270; Stool:30; Blood:950]  BLOOD ADMINISTERED:3 units CC PRBC  DRAINS: RLQ Urostomy, LLQ colostomy, RLQ blake drain   LOCAL MEDICATIONS USED:  NONE  SPECIMEN:  Source of Specimen:  cystoprostatectomy + distal colon en bloc  DISPOSITION OF SPECIMEN:  PATHOLOGY  COUNTS:  YES  TOURNIQUET:  * No tourniquets in log *  DICTATION: .Other Dictation: Dictation Number [id number]  PLAN OF CARE: Admit to inpatient   PATIENT DISPOSITION:  PACU - hemodynamically stable.   Delay start of Pharmacological VTE agent (>24hrs) due to surgical blood loss or risk of bleeding: no

## 2013-11-04 ENCOUNTER — Encounter (HOSPITAL_COMMUNITY): Payer: Self-pay | Admitting: Surgery

## 2013-11-04 DIAGNOSIS — K2970 Gastritis, unspecified, without bleeding: Secondary | ICD-10-CM | POA: Diagnosis present

## 2013-11-04 DIAGNOSIS — F410 Panic disorder [episodic paroxysmal anxiety] without agoraphobia: Secondary | ICD-10-CM | POA: Diagnosis present

## 2013-11-04 HISTORY — PX: LOW ANTERIOR BOWEL RESECTION: SUR1240

## 2013-11-04 HISTORY — PX: CYSTECTOMY W/ URETEROSIGMOIDOSTOMY: SUR362

## 2013-11-04 LAB — BASIC METABOLIC PANEL
BUN: 13 mg/dL (ref 6–23)
CHLORIDE: 101 meq/L (ref 96–112)
CO2: 25 mEq/L (ref 19–32)
Calcium: 8.5 mg/dL (ref 8.4–10.5)
Creatinine, Ser: 1.15 mg/dL (ref 0.50–1.35)
GFR calc Af Amer: 80 mL/min — ABNORMAL LOW (ref >90)
GFR calc non Af Amer: 69 mL/min — ABNORMAL LOW (ref >90)
Glucose, Bld: 270 mg/dL — ABNORMAL HIGH (ref 70–99)
POTASSIUM: 4.6 meq/L (ref 3.7–5.3)
Sodium: 134 mEq/L — ABNORMAL LOW (ref 137–147)

## 2013-11-04 LAB — CBC
HCT: 29.6 % — ABNORMAL LOW (ref 39.0–52.0)
Hemoglobin: 9.2 g/dL — ABNORMAL LOW (ref 13.0–17.0)
MCH: 24 pg — ABNORMAL LOW (ref 26.0–34.0)
MCHC: 31.1 g/dL (ref 30.0–36.0)
MCV: 77.3 fL — ABNORMAL LOW (ref 78.0–100.0)
PLATELETS: 449 10*3/uL — AB (ref 150–400)
RBC: 3.83 MIL/uL — AB (ref 4.22–5.81)
RDW: 17.7 % — ABNORMAL HIGH (ref 11.5–15.5)
WBC: 23.9 10*3/uL — AB (ref 4.0–10.5)

## 2013-11-04 LAB — GLUCOSE, CAPILLARY
GLUCOSE-CAPILLARY: 280 mg/dL — AB (ref 70–99)
GLUCOSE-CAPILLARY: 306 mg/dL — AB (ref 70–99)
Glucose-Capillary: 254 mg/dL — ABNORMAL HIGH (ref 70–99)
Glucose-Capillary: 200 mg/dL — ABNORMAL HIGH (ref 70–99)
Glucose-Capillary: 141 mg/dL — ABNORMAL HIGH (ref 70–99)
Glucose-Capillary: 140 mg/dL — ABNORMAL HIGH (ref 70–99)

## 2013-11-04 LAB — PHOSPHORUS: PHOSPHORUS: 4.5 mg/dL (ref 2.3–4.6)

## 2013-11-04 MED ORDER — ONDANSETRON 8 MG/NS 50 ML IVPB
8.0000 mg | Freq: Four times a day (QID) | INTRAVENOUS | Status: DC | PRN
  Filled 2013-11-04: qty 8

## 2013-11-04 MED ORDER — LACTATED RINGERS IV SOLN: INTRAVENOUS | Status: DC

## 2013-11-04 MED ORDER — LIP MEDEX EX OINT
1.0000 "application " | TOPICAL_OINTMENT | Freq: Two times a day (BID) | CUTANEOUS | Status: DC
  Administered 2013-11-04 – 2013-11-10 (×13): 1 via TOPICAL
  Filled 2013-11-04: qty 7

## 2013-11-04 MED ORDER — ONDANSETRON HCL 4 MG/2ML IJ SOLN: 4.0000 mg | Freq: Four times a day (QID) | INTRAMUSCULAR | Status: DC | PRN

## 2013-11-04 MED ORDER — DIPHENHYDRAMINE HCL 50 MG/ML IJ SOLN: 12.5000 mg | Freq: Four times a day (QID) | INTRAMUSCULAR | Status: DC | PRN

## 2013-11-04 MED ORDER — PROMETHAZINE HCL 25 MG/ML IJ SOLN: 6.2500 mg | Freq: Four times a day (QID) | INTRAMUSCULAR | Status: DC | PRN

## 2013-11-04 MED ORDER — PANTOPRAZOLE SODIUM 40 MG PO TBEC
40.0000 mg | DELAYED_RELEASE_TABLET | Freq: Every day | ORAL | Status: DC
  Administered 2013-11-04 – 2013-11-10 (×7): 40 mg via ORAL
  Filled 2013-11-04 (×7): qty 1

## 2013-11-04 MED ORDER — M.V.I. ADULT IV INJ
INTRAVENOUS | Status: AC
  Administered 2013-11-04: 17:00:00 via INTRAVENOUS
  Filled 2013-11-04: qty 2000

## 2013-11-04 MED ORDER — MAGIC MOUTHWASH
15.0000 mL | Freq: Four times a day (QID) | ORAL | Status: DC | PRN
  Administered 2013-11-07: 15 mL via ORAL
  Filled 2013-11-04: qty 15

## 2013-11-04 MED ORDER — ALUM & MAG HYDROXIDE-SIMETH 200-200-20 MG/5ML PO SUSP: 30.0000 mL | Freq: Four times a day (QID) | ORAL | Status: DC | PRN

## 2013-11-04 MED ORDER — FAT EMULSION 20 % IV EMUL
240.0000 mL | INTRAVENOUS | Status: AC
  Administered 2013-11-04: 240 mL via INTRAVENOUS
  Filled 2013-11-04: qty 250

## 2013-11-04 MED ORDER — SODIUM CHLORIDE 0.9 % IV SOLN
INTRAVENOUS | Status: AC
  Administered 2013-11-05: 01:00:00 via INTRAVENOUS

## 2013-11-04 MED ORDER — SACCHAROMYCES BOULARDII 250 MG PO CAPS
250.0000 mg | ORAL_CAPSULE | Freq: Two times a day (BID) | ORAL | Status: DC
  Administered 2013-11-04 – 2013-11-10 (×13): 250 mg via ORAL
  Filled 2013-11-04 (×14): qty 1

## 2013-11-04 MED ORDER — LACTATED RINGERS IV BOLUS (SEPSIS): 1000.0000 mL | Freq: Three times a day (TID) | INTRAVENOUS | Status: AC | PRN

## 2013-11-04 MED ORDER — METOPROLOL TARTRATE 1 MG/ML IV SOLN
5.0000 mg | Freq: Four times a day (QID) | INTRAVENOUS | Status: DC | PRN
  Filled 2013-11-04: qty 5

## 2013-11-04 NOTE — Anesthesia Postprocedure Evaluation (Signed)
  Anesthesia Post-op Note  Patient: Austin Robbins  Procedure(s) Performed: Procedure(s) (LRB): OPEN ENCOLOSTOMY /COLON RESECTION/COLOSTOMY (N/A) CYSTOSCOPY WITH RIGHT RETROGRADE STENT PLACEMENT , bladder biopsy,TOTAL PROSTATE WITH ENBLOCK CYSTECTOMY AND PROSTATECTOMY/ COLON CONDUIT URINARY DIVERSION/ INSERTION BILATERAL STENTS/ ILEOSTOMY (Bilateral) APPLICATION OF WOUND VAC (N/A)  Patient Location: PACU  Anesthesia Type: General  Level of Consciousness: awake and alert   Airway and Oxygen Therapy: Patient Spontanous Breathing  Post-op Pain: mild  Post-op Assessment: Post-op Vital signs reviewed, Patient's Cardiovascular Status Stable, Respiratory Function Stable, Patent Airway and No signs of Nausea or vomiting  Last Vitals:  Filed Vitals:   11/04/13 0454  BP:   Pulse:   Temp:   Resp: 14    Post-op Vital Signs: stable   Complications: No apparent anesthesia complications

## 2013-11-04 NOTE — Op Note (Addendum)
11/01/2013 - 11/03/2013  11:47 AM  PATIENT:  Austin Robbins  58 y.o. male  Patient Care Team: Chipper Herb, MD as PCP - General (Family Medicine) Alexis Frock, MD as Consulting Physician (Urology) Leighton Ruff, MD as Consulting Physician (General Surgery) Gatha Mayer, MD as Consulting Physician (Gastroenterology) Carola Frost, RN as Registered Nurse  PRE-OPERATIVE DIAGNOSIS:   Perforated rectal cancer  POST-OPERATIVE DIAGNOSIS:  Perforated rectal cancer with invasion of the bladder  PROCEDURE:  CYSTOSCOPY WITH RIGHT RETROGRADE STENT PLACEMENT , bladder biopsy, LOW ANTERIOR RESECTION WITH ENBLOCK CYSTECTOMY AND PROSTATECTOMY/ COLON CONDUIT URINARY DIVERSION/ INSERTION BILATERAL STENTS/ END COLOSTOMY.  APPLICATION OF WOUND VAC  CO-SURGEONS:   Leighton Ruff, MD Alexis Frock, MD  ANESTHESIA:   general  EBL:  Total I/O In: 2992.3 [I.V.:1300; VWU:9811.9] Out: -   DRAINS: (40F) Jackson-Pratt drain(s) with closed bulb suction in the pelvis   SPECIMEN:  Source of Specimen:  en bloc rectosigmoid, bladder and prostate  DISPOSITION OF SPECIMEN:  PATHOLOGY  COUNTS:  YES  PLAN OF CARE: Admit to inpatient   PATIENT DISPOSITION:  PACU - hemodynamically stable.  INDICATION: this is a 58 year old male who has been treated for changes in bowel habits and fevers over the past month. He was found to have a rectal cancer on colonoscopy. Biopsies confirmed this. His CT scan showed localized perforation of his rectum into his pelvis with abscess.urology was consult it preoperatively for possible urinary tract invasion and assistance with surgery.   OR FINDINGS: large pelvic abscess with anterior rectal tumor invading the trigone of the bladder. No other signs of metastatic disease.  DESCRIPTION: the patient was identified in the preoperative holding area and taken to the OR where they were laid supine on the operating room table.  General anesthesia was induced without difficulty. He  was placed in lithotomy position and appropriately padded.  SCDs were also noted to be in place prior to the initiation of anesthesia.  The patient was then prepped and draped in the usual sterile fashion.  A surgical timeout was performed indicating the correct patient, procedure, positioning and need for preoperative antibiotics.  The procedure began with a cystoscopy and stent placement by Dr. Tresa Moore. He was able to place a stent on the right side without trouble. He was unable to place a stent on the left due to inflammation. Biopsies were taken on the left side and showed no evidence of cancer.  The patient was then prepped and draped in the usual sterile fashion for the abdominal portion of the procedure. We began by making a midline incision using a template scalpel. Dissection was carried down through the subcutaneous tissues using electrocartery. The fascia was incised at midline also using electrocautery. The peritoneum was enter bluntly. The abdomen was evaluated.  He had a large redundant sigmoid colon. His small and large bowel appeared normal except for signs of large bowel obstruction with a large stool burden. I palpated both sides of his liver. There were no signs of metastatic disease. The remainder  of his abdomen was palpated as well and was normal. There were some bulky lymph nodes noted in the recto-sigmoid mesentery.  We began by identifying the left ureter. Once this was identified, a vessel loop was placed around it. I divided the rectosigmoid just proximal to the sigmoid artery using a Gia blue load stapler.  The remaing mesentery was divided using the ligasure device. We then placed the Bookwalter clamp and packed the small and remaining large  bowel into the upper abdomen. After this the posterior mesentery was divided until the presacral plane was identified.  The entire pelvic mesentery was very edematous and fibrotic due to chronic infection. There was a hard mass palpated anteriorly.  I bluntly dissected down the presacral plane.  We then dissected laterally using the ligasure device for hemostasis.  We palpated the anterior plane. There was an obvious perforation with frank stool and purulence anteriorly. This was irrigated out of the pelvis. We sent a piece of this tissue to pathology for frozen section analysis. This returned as adenocarcinoma.  It was apparent that the cancer was invading the trigone of the bladder. At this point we decided to perform in block cystectomy with the proctectomy.  Dr. Tresa Moore then proceeded with cystectomy at this point. This will be dictated separately. Once the entire bladder and prostate or mobilized, I finished the posterior dissection using the ligasure device.  I was able to get to a portion of rectum that was partially soft deep in the pelvis. I performed a rigid proctoscopy and confirmed at the edge of the tumor appeared to end at approximately 7 to 8 cm from the anal verge. Due to the bulkiness of the tumor and inflammatory mass I was unable to get a stapler into the pelvis and around the rectum. I divided the distal rectum with the ligasure device. The remaining posterior mesentery was fibrotic and adherent to the coccyx. This was bluntly resected. This freed the specimen. It was sent to pathology for further examination.  The pelvis was then packed for hemostasis. We then irrigated the pelvis with several leaders of warm sterile water. After this we remove the packing. There was one small bleeding vessel deep into the pelvis. This was controlled with a metal clip.   After this was completed, we divided a portion of the distal sigmoid: on a separate vascular stop for the urinary conduit. This was constructed by Dr. Tresa Moore. This will also be dictated separately. After this is completed, I mobilize the remaining portion of the descending colon by dividing the white line of Toldt.  I then created a defect for the ostomy at the previously marked ostomy site.  The skin was incised using the electrocautery. The subcutaneous tissue was removed. The fascia was divided in a cruciate manner. The rectus was retracted.  The peritoneum was then divided as well. The colon was them pulled through this defect and secured in the place.  I didn't hear get in the abdomen again with warm sterile water. A 19 [place] Blake drain was placed into the pelvis and brought out through the right lower quadrant and secured into place with a 20 nylon suture. Omentum was the mobilized and drapeD into the pelvis. I then close the fascia using 2 Number one looped PDS sutures. The ostomy was the matured in standard Brooke fashion using interrupted 2-0 Vicryl sutures. The wound vac was then placed over the abdominal wound which measured approximately 10 cm in length by 2 cm in width.  Ostomy appliances were placed on both the colostomy and urostomy. The patient was then awaken and extubated and sent to the post anesthesia care unit in stable condition. All counts were correct for operating room staff.

## 2013-11-04 NOTE — Progress Notes (Signed)
PARENTERAL NUTRITION CONSULT NOTE - Follow up  Pharmacy Consult for TNA Indication: bowel obstruction  Allergies  Allergen Reactions  . Codeine Other (See Comments)    Chest pain    Patient Measurements: Height: 6' (182.9 cm) Weight: 212 lb 1.3 oz (96.2 kg) IBW/kg (Calculated) : 77.6 Adjusted Body Weight: 81.5 kg Usual Weight: 112 kg  Vital Signs: Temp: 97.9 F (36.6 C) (02/28 0400) Temp src: Oral (02/28 0400) BP: 131/73 mmHg (02/28 0700) Pulse Rate: 70 (02/28 0700) Intake/Output from previous day: 02/27 0701 - 02/28 0700 In: 7590 [P.O.:20; I.V.:6900; Blood:670] Out: 2465 [Urine:830; Drains:625; Stool:60; Blood:950]  Labs:  Recent Labs  11/01/13 1715 11/01/13 2030 11/02/13 0550 11/03/13 0646 11/04/13 0505  WBC 17.5* 14.9* 9.4  --  23.9*  HGB 7.4* 6.9* 6.5* 8.4* 9.2*  HCT 24.4* 22.3* 21.7* 26.4* 29.6*  PLT 512* 463* 396  --  449*  APTT 41*  --   --   --   --   INR 1.24  --   --   --   --      Recent Labs  11/01/13 1715 11/02/13 0550 11/04/13 0505  NA 132* 135* 134*  K 4.1 4.0 4.6  CL 92* 99 101  CO2 20 26 25   GLUCOSE 101* 137* 270*  BUN 15 9 13   CREATININE 1.07 0.67 1.15  CALCIUM 9.4 8.6 8.5  MG  --  2.0  --   PHOS  --  3.2 4.5  PROT 7.7 5.9*  --   ALBUMIN 2.6* 2.0*  --   AST 15 12  --   ALT 13 14  --   ALKPHOS 107 81  --   BILITOT 0.5 0.3  --   PREALBUMIN  --  6.1*  --   TRIG  --  126  --    Estimated Creatinine Clearance: 85.2 ml/min (by C-G formula based on Cr of 1.15).    Recent Labs  11/03/13 0611 11/03/13 0726 11/03/13 1810  GLUCAP 125* 125* 230*    CBGs & Insulin requirements past 24 hours:   15 units   Updated Nutritional Goals:  Per RD Rec's 2/26 Kcal: 2200-2400  Protein: 120-140g  Fluid: 2.2-2.4L/day  Clinimix E 5/15 at goal rate of 100 ml/hr with fat emulsion 20% daily at 10 ml/hr will provide 120 g protein and 2184 kCal/day.    Current nutrition:  - Diet: NPO except sips and chips - TNA: started 2/26 PM - mIVF:  LR @ 100 mL/hr post-op   Assessment:  63 yoM admitted 2/25 for further management of rectal carcinoma. The patient has had a 45-50 pound weight loss over the past few months. He also has a history of abdominal pain, loose bowel movements, abdominal distention and prostatitis. He underwent colonoscopy approximately a week ago by Dr. Silvano Rusk, and was found to have rectal carcinoma. He had a CT scan performed today which revealed distended colon, extraluminal air within his pelvis, with his bladder indistinctly seen. Patient does not have a hx of diabetes. Pharmacy has been consulted to dose TNA for bowel obstruction.  2/26: PICC placed and TNA started   2/27: Surgical resection of bowel/tumor and open cystoprostatectomy performed, transferred to ICU post-op  2/28: appears stable post-op   Labs: Electrolytes:  Na+ 134, K+ ok, Mag, Phos wnl from 2/26 labs Renal Function: Scr up to 1.15, UOP decreased Hepatic Function: wnl 2/26 Pre-Albumin: 6.1 (2/26)   Triglycerides: 126 (2/26) CBGs: no h/o DM, CBG's mostly below goal < 150  Plan:  At 1800 tonight  Change to Clinimix E 5/15 at 80 ml/hr  IV fat emulsion 20% at 10 ml/hr daily  Standard multivitamins daily,  trace elements MWF given national backorder  Reduce IVF to 40 ml/hr  Continue sensitive SSI q4h  Re-enter CGB checks  Re-enter TNA labs Monday/Thursdays  Bmet, Mag, Phos in AM  Thank you for the consult.  Johny Drilling, PharmD, BCPS Pager: [phone] Pharmacy: [phone] 11/04/2013 8:39 AM

## 2013-11-04 NOTE — Progress Notes (Signed)
PHYSICAL THERAPY NOTE- DR. Johney Maine, please clarify hydrotherapy  order from today. Order for hydrotherapy entered. Reviewed order and chart and spoke with RN. No indication in notes/orders as to location and hydro treatment indicated. RN states MD did not  Indicate hydrotherapy for this pt today. Thank you. Tresa Endo PT [phone]

## 2013-11-04 NOTE — Progress Notes (Signed)
Urology Progress Note  Subjective:     No acute urologic events overnight. Abdominal pain controlled w/ PCA.    ROS: Negative chest pain or SOB.  Objective:  Patient Vitals for the past 24 hrs:  BP Temp Temp src Pulse Resp SpO2 Weight  11/04/13 0935 - - - - 16 99 % -  11/04/13 0700 131/73 mmHg - - 70 15 100 % -  11/04/13 0600 149/97 mmHg - - 70 18 100 % -  11/04/13 0500 134/71 mmHg - - 67 15 100 % -  11/04/13 0454 - - - - 14 97 % -  11/04/13 0400 - 97.9 F (36.6 C) Oral - - - -  11/04/13 0300 133/77 mmHg - - 68 14 98 % -  11/04/13 0200 129/90 mmHg - - - - - -  11/04/13 0100 126/77 mmHg - - 73 10 98 % -  11/04/13 0005 - - - - 12 98 % -  11/04/13 0000 131/80 mmHg 98.1 F (36.7 C) Oral 67 10 98 % 96.2 kg (212 lb 1.3 oz)  11/03/13 2300 123/72 mmHg - - 71 15 99 % -  11/03/13 2200 127/77 mmHg - - 73 10 98 % -  11/03/13 2050 - - - - 16 96 % -  11/03/13 2030 132/77 mmHg 98.8 F (37.1 C) - 70 22 99 % -  11/03/13 2005 - - - - 20 99 % -  11/03/13 2000 126/73 mmHg 98.6 F (37 C) - - - - -  11/03/13 1930 131/75 mmHg - - - - - -  11/03/13 1916 - - - - 19 100 % -  11/03/13 1900 - 98.7 F (37.1 C) - - - - -  11/03/13 1835 - - - - 25 100 % -  11/03/13 1800 132/70 mmHg 98.9 F (37.2 C) - 72 20 100 % -    Physical Exam: General:  No acute distress, awake Cardiovascular:    [x]   S1/S2 present, RRR  []   Irregularly irregular Chest:  CTA-B Abdomen:               []  Soft, appropriately TTP  []  Soft, NTTP  [x]  Soft, appropriately TTP, incision(s) clean/dry/intact, wound vac in midline incision, urostomy red w/ red & blue ureter stent draining amber urine, JP serosanguinous.  Genitourinary: Negative edema. No foley.     I/O last 3 completed shifts: In: 9256.8 [P.O.:20; I.V.:7920; Blood:670] Out: 0160 [Urine:2505; Drains:625; Stool:60; Blood:950]  Recent Labs     11/02/13  0550  11/03/13  0646  11/04/13  0505  HGB  6.5*  8.4*  9.2*  WBC  9.4   --   23.9*  PLT  396   --    449*    Recent Labs     11/02/13  0550  11/04/13  0505  NA  135*  134*  K  4.0  4.6  CL  99  101  CO2  26  25  BUN  9  13  CREATININE  0.67  1.15  CALCIUM  8.6  8.5  GFRNONAA  >90  69*  GFRAA  >90  80*     Recent Labs     11/01/13  1715  INR  1.24  APTT  41*     No components found with this basename: ABG,       Assessment: Colon cancer 11/03/13- Radical cystoprostatectomy with colon conduit urinary diversion. Colon resection with end colostomy.   Plan: Ambulate.  Continue urostomy  and urinary stents.  Agree with transfer to floor.   Rolan Bucco, MD [phone]

## 2013-11-04 NOTE — Consult Note (Signed)
WOC ostomy consult note Stoma type/location: RLQ coloconduit, LLQ colostomy Stomal assessment/size: Visualized through pouch:  Urostomy measures approximately 1 and 3/8 inches and is minimally budded, red, moist.  Colostomy measures approximately 1 and 3/4 inches and is budded, red, moist and edematous.  Os at center. Peristomal assessment: not seen today Treatment options for stomal/peristomal skin: None indicated Output amber urine.  Serosanguinous effluent in colostomy pouch. Ostomy pouching: I have ordered supplies to bedside for changes on Monday in conjunction with VAC (NPWT Change).   [facility] nursing team will follow, and will remain available to this patient, the nursing and surgical teams.  Thanks, Maudie Flakes, MSN, RN, [place], [place], [place] [phone])

## 2013-11-04 NOTE — Progress Notes (Signed)
[place], MD, [address] [place], [place] [address] Phone: [phone] FAX: [place] [phone] 1956-09-06  CARE TEAM:  PCP: Redge Gainer, MD  Outpatient Care Team: Patient Care Team: Chipper Herb, MD as PCP - General (Family Medicine)  Inpatient Treatment Team: Treatment Team: Attending Provider: Leighton Ruff, MD; Consulting Physician: Franchot Gallo, MD; Dietitian: Christie Beckers, RD; Physical Therapist: Claretha Cooper, PT; Registered Nurse: Roena Malady, RN   Subjective:  Sore but PCA helps No events Tol sips  Objective:  Vital signs:  Filed Vitals:   11/04/13 0454 11/04/13 0500 11/04/13 0600 11/04/13 0700  BP:  134/71 149/97 131/73  Pulse:  67 70 70  Temp:      TempSrc:      Resp: 14 15 18 15   Height:      Weight:      SpO2: 97% 100% 100% 100%    Last BM Date:  (Prior to admission)  Intake/Output   Yesterday:  02/27 0701 - 02/28 0700 In: 7590 [P.O.:20; I.V.:6900; Blood:670] Out: 2465 [Urine:830; Drains:625; Stool:60; Blood:950] This shift:     Bowel function:  Flatus: little in bag  BM: little in bag  Drain: serosanguinous  Physical Exam:  General: Pt awake/alert/oriented x4 in no acute distress Eyes: PERRL, normal EOM.  Sclera clear.  No icterus Neuro: CN II-XII intact w/o focal sensory/motor deficits. Lymph: No head/neck/groin lymphadenopathy Psych:  No delerium/psychosis/paranoia HENT: Normocephalic, Mucus membranes moist.  No thrush Neck: Supple, No tracheal deviation Chest: No chest wall pain w good excursion CV:  Pulses intact.  Regular rhythm MS: Normal AROM mjr joints.  No obvious deformity Abdomen: Soft.  Nondistended.  Mildly tender at incisions only.  RLQ Urolstomy pink w ureteral stents.  LLQ colostomy mildly dusky but some gas/stool.  No evidence of peritonitis.  No incarcerated hernias. Ext:  SCDs BLE.  No mjr  edema.  No cyanosis Skin: No petechiae / purpura   Problem List:   Principal Problem:   Rectal adenocarcinoma with perforation & invasion into bladder s/p LAR/cystectomy/colon conduit 11/04/2013 Active Problems:   Intra-abdominal abscess   Pyuria   Anemia of chronic disease   Panic attacks   Assessment  Austin Robbins  58 y.o. male  1 Day Post-Op  Procedure(s): OPEN ENCOLOSTOMY /COLON RESECTION/COLOSTOMY CYSTOSCOPY WITH RIGHT RETROGRADE STENT PLACEMENT , bladder biopsy,TOTAL PROSTATE WITH ENBLOCK CYSTECTOMY AND PROSTATECTOMY/ COLON CONDUIT URINARY DIVERSION/ INSERTION BILATERAL STENTS APPLICATION OF WOUND VAC  Stabilizing  Plan:  -clears -PCA/tylenol/ice/heat for pain control -OK to move to floor -ostomy care/training -anemia OK s/p transfusion - iron -anxiolysis -PPI -VTE prophylaxis- SCDs, etc -mobilize as tolerated to help recovery  Austin Robbins, M.D., F.A.C.S. Gastrointestinal and Minimally Invasive Surgery [facility] [address] [place],  [address] [phone] Main / Paging   11/04/2013   Results:   Labs: Results for orders placed during the hospital encounter of 11/01/13 (from the past 48 hour(s))  PREPARE RBC (CROSSMATCH)     Status: None   Collection Time    11/02/13  9:00 AM      Result Value Ref Range   Order Confirmation ORDER PROCESSED BY BLOOD BANK    GLUCOSE, CAPILLARY     Status: Abnormal   Collection Time    11/02/13  8:04 PM      Result Value Ref Range   Glucose-Capillary 191 (*)  70 - 99 mg/dL  GLUCOSE, CAPILLARY     Status: Abnormal   Collection Time    11/03/13 12:12 AM      Result Value Ref Range   Glucose-Capillary 123 (*) 70 - 99 mg/dL  GLUCOSE, CAPILLARY     Status: Abnormal   Collection Time    11/03/13  4:11 AM      Result Value Ref Range   Glucose-Capillary 135 (*) 70 - 99 mg/dL  SURGICAL PCR SCREEN     Status: None   Collection Time    11/03/13  4:59 AM      Result  Value Ref Range   MRSA, PCR NEGATIVE  NEGATIVE   Staphylococcus aureus NEGATIVE  NEGATIVE   Comment:            The Xpert SA Assay (FDA     approved for NASAL specimens     in patients over 35 years of age),     is one component of     a comprehensive surveillance     program.  Test performance has     been validated by [employer] for patients greater     than or equal to 13 year old.     It is not intended     to diagnose infection nor to     guide or monitor treatment.  GLUCOSE, CAPILLARY     Status: Abnormal   Collection Time    11/03/13  6:11 AM      Result Value Ref Range   Glucose-Capillary 125 (*) 70 - 99 mg/dL  PREPARE RBC (CROSSMATCH)     Status: None   Collection Time    11/03/13  6:21 AM      Result Value Ref Range   Order Confirmation ORDER PROCESSED BY BLOOD BANK    HEMOGLOBIN AND HEMATOCRIT, BLOOD     Status: Abnormal   Collection Time    11/03/13  6:46 AM      Result Value Ref Range   Hemoglobin 8.4 (*) 13.0 - 17.0 g/dL   Comment: DELTA CHECK NOTED     POST TRANSFUSION SPECIMEN   HCT 26.4 (*) 39.0 - 52.0 %  GLUCOSE, CAPILLARY     Status: Abnormal   Collection Time    11/03/13  7:26 AM      Result Value Ref Range   Glucose-Capillary 125 (*) 70 - 99 mg/dL  PREPARE RBC (CROSSMATCH)     Status: None   Collection Time    11/03/13 10:30 AM      Result Value Ref Range   Order Confirmation ORDER PROCESSED BY BLOOD BANK    GLUCOSE, CAPILLARY     Status: Abnormal   Collection Time    11/03/13  6:10 PM      Result Value Ref Range   Glucose-Capillary 230 (*) 70 - 99 mg/dL   Comment 1 Documented in Chart     Comment 2 Notify RN    GLUCOSE, CAPILLARY     Status: Abnormal   Collection Time    11/03/13 11:31 PM      Result Value Ref Range   Glucose-Capillary 306 (*) 70 - 99 mg/dL   Comment 1 Notify RN    GLUCOSE, CAPILLARY     Status: Abnormal   Collection Time    11/04/13  3:56 AM      Result Value Ref Range   Glucose-Capillary 280 (*) 70 - 99 mg/dL   GLUCOSE, CAPILLARY  Status: Abnormal   Collection Time    11/04/13  4:53 AM      Result Value Ref Range   Glucose-Capillary 254 (*) 70 - 99 mg/dL  PHOSPHORUS     Status: None   Collection Time    11/04/13  5:05 AM      Result Value Ref Range   Phosphorus 4.5  2.3 - 4.6 mg/dL  CBC     Status: Abnormal   Collection Time    11/04/13  5:05 AM      Result Value Ref Range   WBC 23.9 (*) 4.0 - 10.5 K/uL   RBC 3.83 (*) 4.22 - 5.81 MIL/uL   Hemoglobin 9.2 (*) 13.0 - 17.0 g/dL   HCT 29.6 (*) 39.0 - 52.0 %   MCV 77.3 (*) 78.0 - 100.0 fL   MCH 24.0 (*) 26.0 - 34.0 pg   MCHC 31.1  30.0 - 36.0 g/dL   RDW 17.7 (*) 11.5 - 15.5 %   Platelets 449 (*) 150 - 400 K/uL  BASIC METABOLIC PANEL     Status: Abnormal   Collection Time    11/04/13  5:05 AM      Result Value Ref Range   Sodium 134 (*) 137 - 147 mEq/L   Potassium 4.6  3.7 - 5.3 mEq/L   Comment: RESULT REPEATED AND VERIFIED     DELTA CHECK NOTED   Chloride 101  96 - 112 mEq/L   CO2 25  19 - 32 mEq/L   Glucose, Bld 270 (*) 70 - 99 mg/dL   BUN 13  6 - 23 mg/dL   Creatinine, Ser 1.15  0.50 - 1.35 mg/dL   Comment: RESULT REPEATED AND VERIFIED     DELTA CHECK NOTED   Calcium 8.5  8.4 - 10.5 mg/dL   GFR calc non Af Amer 69 (*) >90 mL/min   GFR calc Af Amer 80 (*) >90 mL/min   Comment: (NOTE)     The eGFR has been calculated using the CKD EPI equation.     This calculation has not been validated in all clinical situations.     eGFR's persistently <90 mL/min signify possible Chronic Kidney     Disease.  GLUCOSE, CAPILLARY     Status: Abnormal   Collection Time    11/04/13  8:17 AM      Result Value Ref Range   Glucose-Capillary 200 (*) 70 - 99 mg/dL    Imaging / Studies: Dg C-arm 61-120 Min-no Report  11/03/2013   CLINICAL DATA: cysto placement   C-ARM 61-120 MINUTES  Fluoroscopy was utilized by the requesting physician.  No radiographic  interpretation.     Medications / Allergies: per chart  Antibiotics: Anti-infectives    Start     Dose/Rate Route Frequency Ordered Stop   11/04/13 1600  ertapenem (INVANZ) 1 g in sodium chloride 0.9 % 50 mL IVPB     1 g 100 mL/hr over 30 Minutes Intravenous Every 24 hours 11/03/13 2049     11/03/13 0600  cefoTEtan (CEFOTAN) 2 g in dextrose 5 % 50 mL IVPB     2 g 100 mL/hr over 30 Minutes Intravenous On call to O.R. 11/02/13 2007 11/03/13 0935   11/01/13 1800  ertapenem (INVANZ) 1 g in sodium chloride 0.9 % 50 mL IVPB  Status:  Discontinued    Comments:  Please don't start until blood cultures have been obtained today   1 g 100 mL/hr over 30 Minutes Intravenous Every 24 hours  11/01/13 1700 11/03/13 2049       Note: This dictation was prepared with Dragon/digital dictation along with Smartphrase technology. Any transcriptional errors that result from this process are unintentional.

## 2013-11-04 NOTE — Op Note (Signed)
NAME:  Austin Robbins, Austin Robbins NO.:  [id number]  MEDICAL RECORD NO.:  [id number]  LOCATION:  8182                         FACILITY:  [facility]  PHYSICIAN:  Alexis Frock, MD     DATE OF BIRTH:  1955/12/23  DATE OF PROCEDURE: 11/03/2013 DATE OF DISCHARGE:                              OPERATIVE REPORT   PREOPERATIVE DIAGNOSIS:  Locally advanced colon cancer with perforation and clinical prostate and bladder involvement.  PROCEDURE: 1. Open cystoprostatectomy with colon conduit urinary diversion. 2. Cystoscopy with retrograde pyelogram interpretation. 3. Insertion of right ureteral stent. 4. Bladder biopsy with fulguration. 5. Partial colectomy (this portion dictated by Leighton Ruff, MD).  CO-SURGEON: 1. Leighton Ruff, MD   UROLOGY ASSISTANT: 1. Leta Baptist, PA   ESTIMATED BLOOD LOSS:  Approximately 500 mL  FINDINGS: 1. Bladder with significant inflammatory response worrisome for local     tumor invasion at the bladder neck-prostate area. 2. Complete obliteration of left ureteral orifice. 3. Unremarkable right retrograde pyelogram. 4. Clinically locally advanced colon cancer and the tumor area being     completely inseparable from the bladder neck and prostate from open     approach.  Frozen section from this approach corroborated     adenocarcinoma external to the rectum in this location.  DRAINS: 1. Urostomy to straight drain in the right hemi-abdomen.  Bander     stents in situ, right is red and left is blue. 2. Other drains as per General Surgery.  INDICATION:  Austin Robbins is an unfortunate 58 year old gentleman.  He was found on workup of persistent fevers and malaise to have a complex pelvic abscess and fluid collection as well as colon cancer with likely perforation and locally advanced disease by CT imaging and colonoscopy. The patient also has had significant weight loss and hypoalbuminemia. Staging imaging revealed no obvious distant metastatic  disease.  General surgery team had discussed definitive management options in detail with the patient including palliative approach versus definitive resection followed by adjuvant chemotherapy with and without possibly Urology involvement. The Urology team also evaluated the patient and bedside cystoscopy was somewhat reassuring without obvious large mass within the urinary bladder, however, significant inflammatory reaction around the trigone, possibly worrisome for locally advanced disease.  I agreed to be available for urologic intervention including at a minimum cystoscopy with retrogrades and stents to help aid in general surgery portions but also possible local genitourinary resection cystoprostatectomy if it did appear that the cancer was directly invading the structures.  I had previously discussed this with the patient and family in detail including short term and long term implications of this and they wished to proceed.  Informed consent was obtained and placed in the medical record.  PROCEDURE IN DETAIL:  The patient was initially placed into a low lithotomy position.  Sterile field was created by prepping the patient's penis, perineum, proximal thighs using iodine x3.  Next, cystourethroscopy was performed using 22-French rigid scope with 12- degree offset lens.  Inspection of the anterior and posterior urethra revealed some bullous edema, inflammatory changes at the level of the prostatic urethra.  Inspection of the urinary bladder revealed significant amount of debris consistent with likely suspected  perforation into the urinary bladder.  There was, however, no large obvious mass within the urinary bladder, but there was some edematous friable tissue, mostly at the level of the left bladder neck, ureteral orifices.  The right ureteral orifice was identified and cannulated with 6-French end-hole catheter and right retrograde pyelogram was obtained.  Right retrograde  demonstrated a single right ureter, single system right kidney.  No filling defects or narrowing.  No hydroureteronephrosis noted.  A 0.03 Glidewire was advanced at the level of the upper pole and a 6-French end-hole catheter was advanced and set aside as an externalized ureteral catheter.  Multiple attempts were made to identify the left ureteral orifice including attempted cannulation with angle tipped Glidewire in its presumed location times many and this was not easily identified.  There was not noted to be efflux of orange urine on the left side as the patient had previously been given Pyridium, however, it was noted from the right.  At approximately 30 minutes of attempted cannulation this portion was abandoned and 22-French 3 way Foley catheter was placed for to straight drain, irrigation port plugged.  The externalized stent was fashioned to this. The patient was completely repositioned, prepped and draped as per separate operative note by Dr. Maisie Fus. Partial colectomy was performed with Urology assistance. The posterior and lateral colonic planes appear somewhat seperable, however, the left colonic plane was incredibly indurated and palpably contiguous with the base of the bladder and prostate.  A frozen section was sent from this very firm worrisome tissue, which is extra colonic and between the bladder and colon and set aside for frozen section, this was found consistent with adenocarcinoma, likely colon primary.  Again, the tissue was apparently directly invading the bladder and this was completely inseparable bluntly or sharply.  So at this time, it was felt that the patient would likely require cystoprostatectomy in order to achieve local control from his colon cancer, and we discussed this possibility with the patient preoperatively.  As such the bilateral ureters were identified as it coursed over the iliac vessels, dissected distally towards the area of the bladder and  proximally for distance of approximately 4 cm  above the iliac crossing, vessel loops were applied around these, they were doubly clipped and ligated distally.  Lateral dissection was performed towards the area of the endopelvic fascia bilaterally, which was carefully incised towards the area of the apex of the prostate.  On the left side, this area was incredibly indurated and required very careful dissection with LigaSure device.  This nearly controlled the left bladder pedicle. Similarly the right bladder pedicles was controlled using LigaSure device.  There was much less inflammatory response on the right side.  There was also some gross pus as well as some stool at this area, also consistent with likely local perforation and abscess formation.  Space of Retzius was further developed, and the bladder was carefully swept inferiorly, thus exposing the anterior base of the prostate with the dorsal venous complex being controlled with LigaSure device.  Urethra was transected coldly as was the catheter which was placed on superior traction as a bucket handle.  Further dissection was performed in the anterior rectal plane.  As per Dr. Maisie Fus, completion partial colectomy was performed, thus removing the distal colon, rectum, bladder, and prostate completely en bloc and setting aside for permanent pathology.  The area was copiously irrigated multiple time using sterile water and all grossly infected material was removed.  The area of the iliac  vessels and obturator nerve was inspected bilaterally and was found to be intact and hemostatic.  Attention was then directed at urinary diversion.  We considered several possibilities for diversion and it was felt that the simplest means would be to use a colon conduit made of some redundant proximal sigmoid, which appeared to  easily lay in the right hemi- abdomen without twisting of the mesentery and then allowing for a proximal colostomy to this   to avoid internal bowel anastomosis.  As such, a suitable segment of redundant sigmoid was identified, measured 15 cm, taken out of continuity using bowel load stapler.  The mesentery to this was carefully mobilized to avoid excessive devascularization which did not occur.  This easily swept into the right hemi-abdomen at the butt end of the conduit segment.  As the colon mesentery had been completely removed up to the level just proximal to aortic bifurcation, there was no need to bring the left ureter under the mesentery as it no longer existed.  A suitable site for ureteroenteric anastomosis was identified purposely finding a spot between tinea to reduce stricture rate.  A  small segment of colon was then excised approximately 5 mm in diameter at the level of the colonic mucosa which was then everted x4 using interrupted vicryl, the distal  staple line was removed and the colon was carefully flushed into separate basin and the blue-colored Bander stent was brought to this location after placing an anchor stitch at the corner of spatulated left distal ureter.  Ureteroenteric anastomosis was performed using 2 separate running suture lines of 4-0 Vicryl, resulted in excellent mucosa to mucosa apposition, all of which appeared to be viable and with some reassuring bleeding in the mucosal edges.  Similarly ureteroenteric anastomosis was performed at the right ureter  just distal into the conduit segment in the exact same fashion as the left, however, using a red colored Bander stent.  This was positioned at 22 cm to the anastomosis verus the left was positioned approximately 26 cm to the anastomosis. Previously marked stomal site in the right hemi-abdomen was incised at level of skin.  Incision was carried down, keeping a  fatty column to the anterior fascia and a cruciate incision was made.  Each corner of the cruciate incision was tagged with 2-0 Vicryl suture and the stomal site was  further dilated using surgeon's fingers x2 and the distal segment and the Bander stents were brought in this location.  The 4 previously used fascial sutures were used to anchor the conduit segment to prevent herniation at this point.  The ureteroenteric anastomosis was then carefully inspected once again and appeared to be completely patent and still well below the level of insertion into the abdominal wall musculature. The stoma was then imbricated in a rosebud type fashion x4 with intervening  sutures of 3-0 Vicryl.  This segment appeared to be suitably pink and patent and excellent urine efflux was noted from the distal end of bilateral Bander stents.  This was very carefully set aside and end colosotmy was performed as per separate operative note by Dr. Marcello Moores. Final inspection of the pelvis appeared to be hemostatic.  The rectal and urethral stumps were purposely not oversewn given the worry for local infection in the area to allow some drainage if necessary.  Closed suction drain was brought to the level of the pelvis and again the ureteroenteric anastomotic sites were carefully inspected and found to be completely intact and visibly patent.  The closure  was performed as per general surgery note.  Please also note during the cystoscopic portions, a cold cup biopsy was taken of the left bladder neck tissue for frozen section but at this time, it was not consistent with adenocarcinoma but more local inflammation and the base of this was fulgurated.          ______________________________ Sebastian Acheheodore Marijke Guadiana, MD     TM/MEDQ  D:  11/03/2013  T:  11/04/2013  Job:  [id number]

## 2013-11-04 NOTE — Plan of Care (Signed)
Problem: Phase II Progression Outcomes Goal: Progress activity as tolerated unless otherwise ordered Outcome: Progressing Pt up with PT today. PT reported pt walked 50 ft.

## 2013-11-04 NOTE — Evaluation (Signed)
Physical Therapy Evaluation Patient Details Name: Austin Robbins MRN: [id number] DOB: [birth year] Today's Date: 11/04/2013 Time: 7619-5093 PT Time Calculation (min): 29 min  PT Assessment / Plan / Recommendation History of Present Illness  CYSTOSCOPY WITH RIGHT RETROGRADE STENT PLACEMENT , bladder biopsy, LOW ANTERIOR RESECTION WITH ENBLOCK CYSTECTOMY AND PROSTATECTOMY/ COLON CONDUIT URINARY DIVERSION/ INSERTION BILATERAL STENTS/ END COLOSTOMY.  APPLICATION OF WOUND VAC for colon cancer.  Clinical Impression  Pt tolerated ambulating x 50'. Pt will benefit from PT to address problems listed.    PT Assessment  Patient needs continued PT services    Follow Up Recommendations  Home health PT    Does the patient have the potential to tolerate intense rehabilitation      Barriers to Discharge        Equipment Recommendations  Rolling walker with 5" wheels    Recommendations for Other Services     Frequency Min 3X/week    Precautions / Restrictions Precautions Precaution Comments: abdominal drain, colostomy, urostomy,   Pertinent Vitals/Pain Abdominal pain 8, used PCA      Mobility  Bed Mobility Overal bed mobility: Needs Assistance;+ 2 for safety/equipment Bed Mobility: Rolling;Sidelying to Sit;Sit to Sidelying Rolling: +2 for safety/equipment;Min assist Sidelying to sit: +2 for safety/equipment;Mod assist;HOB elevated Sit to sidelying: +2 for safety/equipment;Mod assist;HOB elevated General bed mobility comments: cues to prote ECT ABDOMINAL SURGICAL SITES Transfers Overall transfer level: Needs assistance Equipment used: Rolling walker (2 wheeled) Transfers: Sit to/from Stand Sit to Stand: +2 safety/equipment;Min assist;From elevated surface General transfer comment: CUES FOR SAFETY. Ambulation/Gait Ambulation/Gait assistance: +2 safety/equipment;Min assist Ambulation Distance (Feet): 50 Feet Assistive device: Rolling walker (2 wheeled) General Gait Details: pt did  not rely on RW, did well, stated standing and walking as easy.    Exercises     PT Diagnosis: Difficulty walking;Acute pain;Generalized weakness  PT Problem List: Decreased strength;Decreased activity tolerance;Decreased mobility;Decreased safety awareness;Pain PT Treatment Interventions: DME instruction;Gait training;Stair training;Functional mobility training;Therapeutic activities;Therapeutic exercise;Patient/family education     PT Goals(Current goals can be found in the care plan section) Acute Rehab PT Goals Patient Stated Goal: i want to walk PT Goal Formulation: With patient Time For Goal Achievement: 11/18/13 Potential to Achieve Goals: Good  Visit Information  Last PT Received On: 11/04/13 Assistance Needed: +2 History of Present Illness: CYSTOSCOPY WITH RIGHT RETROGRADE STENT PLACEMENT , bladder biopsy, LOW ANTERIOR RESECTION WITH ENBLOCK CYSTECTOMY AND PROSTATECTOMY/ COLON CONDUIT URINARY DIVERSION/ INSERTION BILATERAL STENTS/ END COLOSTOMY.  APPLICATION OF WOUND VAC for colon cancer.       Prior [place] expects to be discharged to:: Private residence Living Arrangements: Spouse/significant other Available Help at Discharge: Family Type of Home: House Home Access: Stairs to enter Technical brewer of Steps: 5 Entrance Stairs-Rails: Right;Left Home Layout: One level [place]: None Prior Function Level of Independence: Independent Communication Communication: No difficulties    Cognition  Cognition Arousal/Alertness: Awake/alert Behavior During Therapy: WFL for tasks assessed/performed Overall Cognitive Status: Within Functional Limits for tasks assessed    Extremity/Trunk Assessment Upper Extremity Assessment Upper Extremity Assessment: Generalized weakness Lower Extremity Assessment Lower Extremity Assessment: Generalized weakness   Balance    End of Session PT - End of Session Activity Tolerance: Patient  tolerated treatment well Patient left: in bed;with call bell/phone within reach;with family/visitor present Nurse Communication: Mobility status  GP     Claretha Cooper 11/04/2013, 3:29 PM

## 2013-11-05 LAB — GLUCOSE, CAPILLARY
GLUCOSE-CAPILLARY: 132 mg/dL — AB (ref 70–99)
GLUCOSE-CAPILLARY: 155 mg/dL — AB (ref 70–99)
GLUCOSE-CAPILLARY: 156 mg/dL — AB (ref 70–99)
GLUCOSE-CAPILLARY: 160 mg/dL — AB (ref 70–99)
GLUCOSE-CAPILLARY: 161 mg/dL — AB (ref 70–99)
Glucose-Capillary: 155 mg/dL — ABNORMAL HIGH (ref 70–99)
Glucose-Capillary: 171 mg/dL — ABNORMAL HIGH (ref 70–99)
Glucose-Capillary: 154 mg/dL — ABNORMAL HIGH (ref 70–99)
Glucose-Capillary: 151 mg/dL — ABNORMAL HIGH (ref 70–99)

## 2013-11-05 LAB — CBC
HEMATOCRIT: 25.2 % — AB (ref 39.0–52.0)
Hemoglobin: 8 g/dL — ABNORMAL LOW (ref 13.0–17.0)
MCH: 24.8 pg — ABNORMAL LOW (ref 26.0–34.0)
MCHC: 31.7 g/dL (ref 30.0–36.0)
MCV: 78 fL (ref 78.0–100.0)
Platelets: 407 10*3/uL — ABNORMAL HIGH (ref 150–400)
RBC: 3.23 MIL/uL — ABNORMAL LOW (ref 4.22–5.81)
RDW: 18.9 % — ABNORMAL HIGH (ref 11.5–15.5)
WBC: 18 10*3/uL — AB (ref 4.0–10.5)

## 2013-11-05 LAB — BASIC METABOLIC PANEL
BUN: 19 mg/dL (ref 6–23)
CHLORIDE: 98 meq/L (ref 96–112)
CO2: 25 mEq/L (ref 19–32)
Calcium: 8.4 mg/dL (ref 8.4–10.5)
Creatinine, Ser: 1.28 mg/dL (ref 0.50–1.35)
GFR calc Af Amer: 70 mL/min — ABNORMAL LOW (ref >90)
GFR calc non Af Amer: 61 mL/min — ABNORMAL LOW (ref >90)
GLUCOSE: 165 mg/dL — AB (ref 70–99)
Potassium: 4.2 mEq/L (ref 3.7–5.3)
Sodium: 135 mEq/L — ABNORMAL LOW (ref 137–147)

## 2013-11-05 LAB — PHOSPHORUS: Phosphorus: 4.1 mg/dL (ref 2.3–4.6)

## 2013-11-05 LAB — MAGNESIUM: Magnesium: 1.8 mg/dL (ref 1.5–2.5)

## 2013-11-05 MED ORDER — INSULIN ASPART 100 UNIT/ML ~~LOC~~ SOLN: 0.0000 [IU] | SUBCUTANEOUS | Status: DC

## 2013-11-05 MED ORDER — FAT EMULSION 20 % IV EMUL
240.0000 mL | INTRAVENOUS | Status: AC
  Administered 2013-11-05: 240 mL via INTRAVENOUS
  Filled 2013-11-05: qty 250

## 2013-11-05 MED ORDER — SODIUM CHLORIDE 0.9 % IJ SOLN
10.0000 mL | INTRAMUSCULAR | Status: DC | PRN
  Administered 2013-11-05 – 2013-11-10 (×6): 10 mL

## 2013-11-05 MED ORDER — SODIUM CHLORIDE 0.9 % IV SOLN
INTRAVENOUS | Status: DC
  Administered 2013-11-06 (×2): via INTRAVENOUS

## 2013-11-05 MED ORDER — M.V.I. ADULT IV INJ
INTRAVENOUS | Status: AC
  Administered 2013-11-05: 18:00:00 via INTRAVENOUS
  Filled 2013-11-05: qty 2400

## 2013-11-05 MED ORDER — INSULIN ASPART 100 UNIT/ML ~~LOC~~ SOLN
0.0000 [IU] | SUBCUTANEOUS | Status: DC
  Administered 2013-11-05: 3 [IU] via SUBCUTANEOUS
  Administered 2013-11-05: 2 [IU] via SUBCUTANEOUS
  Administered 2013-11-05: 3 [IU] via SUBCUTANEOUS
  Administered 2013-11-05 – 2013-11-06 (×2): 2 [IU] via SUBCUTANEOUS
  Administered 2013-11-06: 3 [IU] via SUBCUTANEOUS
  Administered 2013-11-06: 2 [IU] via SUBCUTANEOUS
  Administered 2013-11-06: 3 [IU] via SUBCUTANEOUS
  Administered 2013-11-06 – 2013-11-07 (×3): 2 [IU] via SUBCUTANEOUS
  Administered 2013-11-07: 3 [IU] via SUBCUTANEOUS
  Administered 2013-11-07 (×2): 2 [IU] via SUBCUTANEOUS
  Administered 2013-11-07: 3 [IU] via SUBCUTANEOUS
  Administered 2013-11-08 (×3): 2 [IU] via SUBCUTANEOUS

## 2013-11-05 NOTE — Progress Notes (Signed)
PARENTERAL NUTRITION CONSULT NOTE - Follow up  Pharmacy Consult for TNA Indication: bowel obstruction  Allergies  Allergen Reactions  . Codeine Other (See Comments)    Chest pain    Patient Measurements: Height: 6' (182.9 cm) Weight: 212 lb 1.3 oz (96.2 kg) IBW/kg (Calculated) : 77.6 Adjusted Body Weight: 81.5 kg Usual Weight: 112 kg  Vital Signs: Temp: 98.6 F (37 C) (03/01 0656) Temp src: Oral (03/01 0656) BP: 129/68 mmHg (03/01 0656) Pulse Rate: 80 (03/01 0656) Intake/Output from previous day: 02/28 0701 - 03/01 0700 In: 5372.1 [I.V.:2319.8; TPN:3052.3] Out: 2300 [Urine:1650; Drains:590; Stool:60]  Labs:  Recent Labs  11/03/13 0646 11/04/13 0505 11/05/13 0342  WBC  --  23.9* 18.0*  HGB 8.4* 9.2* 8.0*  HCT 26.4* 29.6* 25.2*  PLT  --  449* 407*     Recent Labs  11/04/13 0505 11/05/13 0342  NA 134* 135*  K 4.6 4.2  CL 101 98  CO2 25 25  GLUCOSE 270* 165*  BUN 13 19  CREATININE 1.15 1.28  CALCIUM 8.5 8.4  MG  --  1.8  PHOS 4.5 4.1   Estimated Creatinine Clearance: 76.6 ml/min (by C-G formula based on Cr of 1.28).    Recent Labs  11/05/13 0041 11/05/13 0339 11/05/13 0804  GLUCAP 160* 156* 161*    CBGs & Insulin requirements past 24 hours:   8 units   Updated Nutritional Goals:  Per RD Rec's 2/26 Kcal: 2200-2400  Protein: 120-140g  Fluid: 2.2-2.4L/day  Clinimix E 5/15 at goal rate of 100 ml/hr with fat emulsion 20% daily at 10 ml/hr will provide 120 g protein and 2184 kCal/day.    Current nutrition:  - Diet: FLD started 3/1 AM - TNA: started 2/26 PM - mIVF: NS @ 40 mL/hr   Assessment:  29 yoM admitted 2/25 for further management of rectal carcinoma. The patient has had a 45-50 pound weight loss over the past few months. He also has a history of abdominal pain, loose bowel movements, abdominal distention and prostatitis. He underwent colonoscopy approximately a week ago by Dr. Silvano Rusk, and was found to have rectal carcinoma. He  had a CT scan performed today which revealed distended colon, extraluminal air within his pelvis, with his bladder indistinctly seen. Patient does not have a hx of diabetes. Pharmacy has been consulted to dose TNA for bowel obstruction.  2/26: PICC placed and TNA started   2/27: Surgical resection of bowel/tumor and open cystoprostatectomy performed, transferred to ICU post-op  2/28: appears stable post-op  3/1: starting FLD today, renal fnx slightly worse, urology MD says stents functioning well. WBC decreasing   Labs: Electrolytes:  Na+ 135, K+, Mag, Phos wnl Renal Function: Scr up to 1.28, UOP better Hepatic Function: wnl 2/26 Pre-Albumin: 6.1 (2/26)   Triglycerides: 126 (2/26) CBGs: no h/o DM, CBG's 155-161, more stable after change to Clinimix E 5/15    Plan:  At 1800 tonight  Increase Clinimix E 5/15 to 100 ml/hr  IV fat emulsion 20% at 10 ml/hr daily  Standard multivitamins daily,  trace elements MWF given national backorder  Reduce IVF to 20 ml/hr  Increase SSI to moderate scale q4h  TNA labs Monday/Thursdays  F/u diet advancement/toleration  Thank you for the consult.  Johny Drilling, PharmD, BCPS Pager: [phone] Pharmacy: [phone] 11/05/2013 9:38 AM

## 2013-11-05 NOTE — Progress Notes (Signed)
Patient ID: BARY LIMBACH, male   DOB: 11-Oct-1955, 58 y.o.   MRN: [id number] 2 Days Post-Op  Subjective: Has incisional pain when coughing but generally pain medicine working okay. Denies nausea or vomiting. Tolerated clear liquids and progressing to a full liquid diet.  Objective: Vital signs in last 24 hours: Temp:  [98.4 F (36.9 C)-99.2 F (37.3 C)] 98.6 F (37 C) (03/01 0656) Pulse Rate:  [73-88] 80 (03/01 0656) Resp:  [14-25] 14 (03/01 0834) BP: (129-155)/(68-80) 129/68 mmHg (03/01 0656) SpO2:  [96 %-100 %] 96 % (03/01 0834) Last BM Date: 11/04/13  Intake/Output from previous day: 02/28 0701 - 03/01 0700 In: 5372.1 [I.V.:2319.8; TPN:3052.3] Out: 2300 [Urine:1650; Drains:590; Stool:60] Intake/Output this shift:    General appearance: alert, cooperative and mild distress Resp: clear to auscultation bilaterally and  no increased work of breathing GI: firm with mild diffuse tenderness. Stoma appears healthy. No ostomy output. Incision/Wound: VAC in place, clean and dry.  Lab Results:   Recent Labs  11/04/13 0505 11/05/13 0342  WBC 23.9* 18.0*  HGB 9.2* 8.0*  HCT 29.6* 25.2*  PLT 449* 407*   BMET  Recent Labs  11/04/13 0505 11/05/13 0342  NA 134* 135*  K 4.6 4.2  CL 101 98  CO2 25 25  GLUCOSE 270* 165*  BUN 13 19  CREATININE 1.15 1.28  CALCIUM 8.5 8.4     Studies/Results: No results found.  Anti-infectives: Anti-infectives   Start     Dose/Rate Route Frequency Ordered Stop   11/04/13 1600  ertapenem (INVANZ) 1 g in sodium chloride 0.9 % 50 mL IVPB     1 g 100 mL/hr over 30 Minutes Intravenous Every 24 hours 11/03/13 2049 11/11/13 1559   11/03/13 0600  cefoTEtan (CEFOTAN) 2 g in dextrose 5 % 50 mL IVPB     2 g 100 mL/hr over 30 Minutes Intravenous On call to O.R. 11/02/13 2007 11/03/13 0935   11/01/13 1800  ertapenem (INVANZ) 1 g in sodium chloride 0.9 % 50 mL IVPB  Status:  Discontinued    Comments:  Please don't start until blood cultures have  been obtained today   1 g 100 mL/hr over 30 Minutes Intravenous Every 24 hours 11/01/13 1700 11/03/13 2049      Assessment/Plan: s/p Procedure(s): OPEN ENCOLOSTOMY /COLON RESECTION/COLOSTOMY CYSTOSCOPY WITH RIGHT RETROGRADE STENT PLACEMENT , bladder biopsy,TOTAL PROSTATE WITH ENBLOCK CYSTECTOMY AND PROSTATECTOMY/ COLON CONDUIT URINARY DIVERSION/ INSERTION BILATERAL STENTS/ ILEOSTOMY APPLICATION OF WOUND VAC Appear stable postoperatively. Blood loss anemia with some drop in hemoglobin. Repeat CBC tomorrow. Got out of bed yesterday. Gradually increase activity.   LOS: 4 days    Clay Menser T 11/05/2013

## 2013-11-05 NOTE — Progress Notes (Signed)
Urology Progress Note  Subjective:     No acute urologic events overnight. Coughing up sputum. Pain controlled. Ambulated w/ physical therapy.    ROS: Negative chest pain or SOB.  Objective:  Patient Vitals for the past 24 hrs:  BP Temp Temp src Pulse Resp SpO2  11/05/13 0345 - - - - 20 96 %  11/05/13 0150 130/71 mmHg 99.2 F (37.3 C) Oral 88 18 99 %  11/05/13 0000 - - - - 25 99 %  11/04/13 2147 136/80 mmHg 98.9 F (37.2 C) Oral 80 18 99 %  11/04/13 2003 - - - - 18 99 %  11/04/13 1800 135/78 mmHg 98.6 F (37 C) Oral 76 16 99 %  11/04/13 1559 - - - - 16 97 %  11/04/13 1313 134/74 mmHg 98.4 F (36.9 C) Oral 75 16 98 %  11/04/13 1200 155/80 mmHg - - 75 20 100 %  11/04/13 1100 141/71 mmHg - - 73 16 100 %  11/04/13 1000 135/80 mmHg - - 74 19 98 %  11/04/13 0935 - - - - 16 99 %  11/04/13 0900 134/71 mmHg - - 71 18 100 %  11/04/13 0800 129/84 mmHg 97.6 F (36.4 C) Oral 70 15 100 %  11/04/13 0700 131/73 mmHg - - 70 15 100 %    Physical Exam: General:  No acute distress, awake Cardiovascular:    [x]   S1/S2 present, RRR  []   Irregularly irregular Chest:  CTA-B Abdomen:               []  Soft, appropriately TTP  []  Soft, NTTP  [x]  Soft, appropriately TTP, incision(s) clean/dry/intact, wound vac in midline incision, urostomy pink w/ red & blue ureter stent draining amber urine, JP serosanguinous.  Genitourinary: Negative edema. No foley.     I/O last 3 completed shifts: In: 11514.8 [P.O.:20; I.V.:8852.5; Blood:670] Out: 4818 [Urine:1330; Drains:985; Stool:60; Blood:950]  Recent Labs     11/04/13  0505  11/05/13  0342  HGB  9.2*  8.0*  WBC  23.9*  18.0*  PLT  449*  407*    Recent Labs     11/04/13  0505  11/05/13  0342  NA  134*  135*  K  4.6  4.2  CL  101  98  CO2  25  25  BUN  13  19  CREATININE  1.15  1.28  CALCIUM  8.5  8.4  GFRNONAA  69*  61*  GFRAA  80*  70*     No results found for this basename: PT, INR, APTT,  in the last 72 hours   No  components found with this basename: ABG,       Assessment: Colon cancer 11/03/13- Radical cystoprostatectomy with colon conduit urinary diversion. Colon resection with end colostomy.   Plan: Ambulate.  Renal function slightly decreased; most likely pre-renal as stents appear to be functioning properly.  Continue supportive care.   Rolan Bucco, MD [phone]

## 2013-11-06 ENCOUNTER — Ambulatory Visit (INDEPENDENT_AMBULATORY_CARE_PROVIDER_SITE_OTHER): Payer: 59 | Admitting: General Surgery

## 2013-11-06 ENCOUNTER — Encounter (HOSPITAL_COMMUNITY): Payer: Self-pay | Admitting: General Surgery

## 2013-11-06 LAB — GLUCOSE, CAPILLARY
GLUCOSE-CAPILLARY: 145 mg/dL — AB (ref 70–99)
GLUCOSE-CAPILLARY: 158 mg/dL — AB (ref 70–99)
Glucose-Capillary: 124 mg/dL — ABNORMAL HIGH (ref 70–99)
Glucose-Capillary: 136 mg/dL — ABNORMAL HIGH (ref 70–99)
Glucose-Capillary: 145 mg/dL — ABNORMAL HIGH (ref 70–99)
Glucose-Capillary: 148 mg/dL — ABNORMAL HIGH (ref 70–99)
Glucose-Capillary: 158 mg/dL — ABNORMAL HIGH (ref 70–99)

## 2013-11-06 LAB — POCT I-STAT 4, (NA,K, GLUC, HGB,HCT)
GLUCOSE: 519 mg/dL — AB (ref 70–99)
Glucose, Bld: 173 mg/dL — ABNORMAL HIGH (ref 70–99)
Glucose, Bld: 218 mg/dL — ABNORMAL HIGH (ref 70–99)
HCT: 26 % — ABNORMAL LOW (ref 39.0–52.0)
HEMATOCRIT: 23 % — AB (ref 39.0–52.0)
HEMATOCRIT: 29 % — AB (ref 39.0–52.0)
HEMOGLOBIN: 8.8 g/dL — AB (ref 13.0–17.0)
HEMOGLOBIN: 9.9 g/dL — AB (ref 13.0–17.0)
Hemoglobin: 7.8 g/dL — ABNORMAL LOW (ref 13.0–17.0)
POTASSIUM: 4.9 meq/L (ref 3.7–5.3)
POTASSIUM: 5.3 meq/L (ref 3.7–5.3)
POTASSIUM: 5.1 meq/L (ref 3.7–5.3)
SODIUM: 131 meq/L — AB (ref 137–147)
SODIUM: 134 meq/L — AB (ref 137–147)
Sodium: 137 mEq/L (ref 137–147)

## 2013-11-06 LAB — COMPREHENSIVE METABOLIC PANEL
ALK PHOS: 78 U/L (ref 39–117)
ALT: 9 U/L (ref 0–53)
AST: 11 U/L (ref 0–37)
Albumin: 1.6 g/dL — ABNORMAL LOW (ref 3.5–5.2)
BILIRUBIN TOTAL: 0.2 mg/dL — AB (ref 0.3–1.2)
BUN: 13 mg/dL (ref 6–23)
CO2: 24 meq/L (ref 19–32)
Calcium: 8.5 mg/dL (ref 8.4–10.5)
Chloride: 100 mEq/L (ref 96–112)
Creatinine, Ser: 0.62 mg/dL (ref 0.50–1.35)
GFR calc Af Amer: >90 mL/min (ref >90)
GLUCOSE: 151 mg/dL — AB (ref 70–99)
POTASSIUM: 3.9 meq/L (ref 3.7–5.3)
SODIUM: 136 meq/L — AB (ref 137–147)
TOTAL PROTEIN: 5.3 g/dL — AB (ref 6.0–8.3)

## 2013-11-06 LAB — CBC
HCT: 25.9 % — ABNORMAL LOW (ref 39.0–52.0)
HEMOGLOBIN: 8 g/dL — AB (ref 13.0–17.0)
MCH: 24.4 pg — ABNORMAL LOW (ref 26.0–34.0)
MCHC: 30.9 g/dL (ref 30.0–36.0)
MCV: 79 fL (ref 78.0–100.0)
Platelets: 403 10*3/uL — ABNORMAL HIGH (ref 150–400)
RBC: 3.28 MIL/uL — AB (ref 4.22–5.81)
RDW: 20.1 % — ABNORMAL HIGH (ref 11.5–15.5)
WBC: 15.4 10*3/uL — ABNORMAL HIGH (ref 4.0–10.5)

## 2013-11-06 LAB — TYPE AND SCREEN
ABO/RH(D): A POS
Antibody Screen: NEGATIVE
UNIT DIVISION: 0
UNIT DIVISION: 0
UNIT DIVISION: 0
Unit division: 0
Unit division: 0
Unit division: 0

## 2013-11-06 LAB — DIFFERENTIAL
BASOS PCT: 0 % (ref 0–1)
Basophils Absolute: 0 10*3/uL (ref 0.0–0.1)
EOS ABS: 0.3 10*3/uL (ref 0.0–0.7)
Eosinophils Relative: 2 % (ref 0–5)
Lymphocytes Relative: 11 % — ABNORMAL LOW (ref 12–46)
Lymphs Abs: 1.7 10*3/uL (ref 0.7–4.0)
MONO ABS: 1.1 10*3/uL — AB (ref 0.1–1.0)
Monocytes Relative: 7 % (ref 3–12)
Neutro Abs: 12.3 10*3/uL — ABNORMAL HIGH (ref 1.7–7.7)
Neutrophils Relative %: 80 % — ABNORMAL HIGH (ref 43–77)

## 2013-11-06 LAB — TRIGLYCERIDES: Triglycerides: 134 mg/dL (ref <150)

## 2013-11-06 LAB — CREATININE, FLUID (PLEURAL, PERITONEAL, JP DRAINAGE): CREAT FL: 0.7 mg/dL

## 2013-11-06 LAB — MAGNESIUM: Magnesium: 1.8 mg/dL (ref 1.5–2.5)

## 2013-11-06 LAB — PHOSPHORUS: Phosphorus: 3.5 mg/dL (ref 2.3–4.6)

## 2013-11-06 LAB — PREALBUMIN: PREALBUMIN: 6.7 mg/dL — AB (ref 17.0–34.0)

## 2013-11-06 MED ORDER — OXYCODONE HCL 5 MG PO TABS
5.0000 mg | ORAL_TABLET | ORAL | Status: DC | PRN
  Administered 2013-11-07: 10 mg via ORAL
  Filled 2013-11-06: qty 2

## 2013-11-06 MED ORDER — FAT EMULSION 20 % IV EMUL
240.0000 mL | INTRAVENOUS | Status: AC
  Administered 2013-11-06: 240 mL via INTRAVENOUS
  Filled 2013-11-06: qty 250

## 2013-11-06 MED ORDER — TRACE MINERALS CR-CU-F-FE-I-MN-MO-SE-ZN IV SOLN
INTRAVENOUS | Status: AC
  Administered 2013-11-06: 19:00:00 via INTRAVENOUS
  Filled 2013-11-06: qty 2400

## 2013-11-06 NOTE — Progress Notes (Signed)
PARENTERAL NUTRITION CONSULT NOTE - Follow up  Pharmacy Consult for TNA Indication: bowel obstruction  Allergies  Allergen Reactions  . Codeine Other (See Comments)    Chest pain    Patient Measurements: Height: 6' (182.9 cm) Weight: 212 lb 1.3 oz (96.2 kg) IBW/kg (Calculated) : 77.6 Adjusted Body Weight: 81.5 kg Usual Weight: 112 kg  Vital Signs: Temp: 99.2 F (37.3 C) (03/02 0623) Temp src: Oral (03/02 0623) BP: 122/69 mmHg (03/02 0623) Pulse Rate: 83 (03/02 0623) Intake/Output from previous day: 03/01 0701 - 03/02 0700 In: 2032.7 [I.V.:712.7; TPN:1320] Out: 1829 [Urine:2950; Drains:525; Stool:50]  Labs:  Recent Labs  11/04/13 0505 11/05/13 0342 11/06/13 0417  WBC 23.9* 18.0* 15.4*  HGB 9.2* 8.0* 8.0*  HCT 29.6* 25.2* 25.9*  PLT 449* 407* 403*     Recent Labs  11/04/13 0505 11/05/13 0342 11/06/13 0417  NA 134* 135* 136*  K 4.6 4.2 3.9  CL 101 98 100  CO2 25 25 24   GLUCOSE 270* 165* 151*  BUN 13 19 13   CREATININE 1.15 1.28 0.62  CALCIUM 8.5 8.4 8.5  MG  --  1.8 1.8  PHOS 4.5 4.1 3.5  PROT  --   --  5.3*  ALBUMIN  --   --  1.6*  AST  --   --  11  ALT  --   --  9  ALKPHOS  --   --  78  BILITOT  --   --  0.2*  TRIG  --   --  134   Estimated Creatinine Clearance: 122.5 ml/min (by C-G formula based on Cr of 0.62).    Recent Labs  11/05/13 2340 11/06/13 0351 11/06/13 0753  GLUCAP 145* 136* 148*    CBGs & Insulin requirements past 24 hours:   12 units Novolog SSI (on q4h moderate coverage schedule)  Updated Nutritional Goals:  Per RD Rec's 2/26 Kcal: 2200-2400  Protein: 120-140g  Fluid: 2.2-2.4L/day  Clinimix E 5/15 at goal rate of 100 ml/hr with fat emulsion 20% daily at 10 ml/hr will provide 120 g protein and 2184 kCal/day.    Current nutrition:  - Diet: FLD (started 3/1 AM) - TNA: Clinimix-E 5/15 100 mL/hr + Fat Emulsion 20% at 10 mL/hr - mIVF: NS @ 20 mL/hr   Assessment:  71 yoM admitted 2/25 for further management of rectal  carcinoma. On 2/27 underwent surgical resection of bowel/tumor and open cystoprostatectomy. TNA was started on 2/26 with pharmacy dosing assistance.  Patient does not have a hx of diabetes.   3/2:  Labs: Electrolytes:  WNL except Na slightly low (136) - unable to adjust Na content of premixed Clinimix formula. Renal Function: SCr improved, WNL.  UOP 1.3 mL/kg/hr. Hepatic Function: LFTs below ULN Pre-Albumin: 6.1 (2/26); today's value pending Triglycerides: WNL CBGs: improved after change in TPN formula to Clinimix-E 5/15; now < 150 on sliding scale insulin coverage  Day#5 TNA (2/26 - present) Access: PICC    Plan:  At 1800 tonight  Continue Clinimix E 5/15 at 100 ml/hr (goal rate)  Continue IV fat emulsion 20% at 10 ml/hr daily  Standard multivitamins daily,  trace elements MWF given national backorder  Continue IVF at 20 ml/hr  Continue SSI to moderate scale q4h  TNA labs Monday/Thursdays  F/U diet advancement when appropriate  Clayburn Pert, PharmD, BCPS Pager: [phone] 11/06/2013  9:01 AM

## 2013-11-06 NOTE — Progress Notes (Signed)
Patient ID: Austin Robbins, male   DOB: 07/03/1956, 58 y.o.   MRN: [id number] 3 Days Post-Op LAR, cystectomy, colon conduit, colostomy Subjective: Has some incisional pain.  PCA doing ok.  Denies nausea or vomiting. Tolerated full liquids  Objective: Vital signs in last 24 hours: Temp:  [97.1 F (36.2 C)-100.2 F (37.9 C)] 99.2 F (37.3 C) (03/02 0623) Pulse Rate:  [79-90] 83 (03/02 0623) Resp:  [14-24] 20 (03/02 0802) BP: (110-128)/(62-75) 122/69 mmHg (03/02 0623) SpO2:  [95 %-98 %] 96 % (03/02 0802) Last BM Date: 11/05/13  Intake/Output from previous day: 03/01 0701 - 03/02 0700 In: 2032.7 [I.V.:712.7; TPN:1320] Out: 7414 [Urine:2950; Drains:525; Stool:50] Intake/Output this shift: Total I/O In: 120 [P.O.:120] Out: -   General appearance: alert, cooperative and mild distress Resp: clear to auscultation bilaterally and  no increased work of breathing GI: firm with mild diffuse tenderness. Stoma appears healthy, edematous. No ostomy output. Incision/Wound: VAC in place, clean and dry. JP: serous fluid  Lab Results:   Recent Labs  11/05/13 0342 11/06/13 0417  WBC 18.0* 15.4*  HGB 8.0* 8.0*  HCT 25.2* 25.9*  PLT 407* 403*   BMET  Recent Labs  11/05/13 0342 11/06/13 0417  NA 135* 136*  K 4.2 3.9  CL 98 100  CO2 25 24  GLUCOSE 165* 151*  BUN 19 13  CREATININE 1.28 0.62  CALCIUM 8.4 8.5     Studies/Results: No results found.  Anti-infectives: Anti-infectives   Start     Dose/Rate Route Frequency Ordered Stop   11/04/13 1600  ertapenem (INVANZ) 1 g in sodium chloride 0.9 % 50 mL IVPB     1 g 100 mL/hr over 30 Minutes Intravenous Every 24 hours 11/03/13 2049 11/11/13 1559   11/03/13 0600  cefoTEtan (CEFOTAN) 2 g in dextrose 5 % 50 mL IVPB     2 g 100 mL/hr over 30 Minutes Intravenous On call to O.R. 11/02/13 2007 11/03/13 0935   11/01/13 1800  ertapenem (INVANZ) 1 g in sodium chloride 0.9 % 50 mL IVPB  Status:  Discontinued    Comments:  Please don't  start until blood cultures have been obtained today   1 g 100 mL/hr over 30 Minutes Intravenous Every 24 hours 11/01/13 1700 11/03/13 2049      Assessment/Plan: s/p Procedure(s): OPEN EN BLOC PELVIC  RESECTION: CYSTOSCOPY WITH RIGHT RETROGRADE STENT PLACEMENT , bladder biopsy,TOTAL PROSTATE WITH ENBLOCK CYSTECTOMY AND PROCTECTOMY/ COLON CONDUIT URINARY DIVERSION/ INSERTION BILATERAL STENTS/ COLOSTOMY APPLICATION OF WOUND VAC Appear stable postoperatively. Blood loss anemia and anemia of chronic disease, Hgb stable.  Will start Iron supplements when having BM's Gradually increase activity. Start PO narcotics   LOS: 5 days    Austin Robbins C. 10/10/9530

## 2013-11-06 NOTE — Clinical Documentation Improvement (Signed)
INITIAL NUTRITION ASSESSMENT  11/02/13  Pt meets criteria for severe MALNUTRITION in the context of chronic illness as evidenced by <75% estimated energy intake with 7.4% weight loss in the past month.  Possible Clinical Conditions?  Severe Malnutrition   Protein Calorie Malnutrition Severe Protein Calorie Malnutrition Emaciation  Cachexia    Other Condition Cannot clinically determine  Supporting Information: Risk Factors   Perforated rectum  multiple pelvic abscesses,rectal adenocarcinoma   Signs & Symptoms:last 2-3 months he's had an unintentional 45 pound weight loss  Diagnostics:Height: 6'    Weight: 199 lbs   Body mass index is 27.11   Treatment:INTERVENTION:  - TPN per pharmacy  - Will continue to monitor Weights, labs, TPN  pantoprazole (PROTONIX) IV 40 mg       Thank Glendora Score ,RN Clinical Documentation Specialist:  [place] [facility]

## 2013-11-06 NOTE — Progress Notes (Signed)
PHYSICAL THERAPY NOTE-Hydrotherapy does not appear to be indicated in any MD notes. Please reorder. Note VAC to be changed today.Tresa Endo [phone]

## 2013-11-06 NOTE — Progress Notes (Signed)
Occupational Therapy Evaluation Patient Details Name: Austin Robbins MRN: [id number] DOB: 05/29/1956 Today's Date: 11/06/2013 Time: 6433-2951 OT Time Calculation (min): 19 min  OT Assessment / Plan / Recommendation History of present illness CYSTOSCOPY WITH RIGHT RETROGRADE STENT PLACEMENT , bladder biopsy, LOW ANTERIOR RESECTION WITH ENBLOCK CYSTECTOMY AND PROSTATECTOMY/ COLON CONDUIT URINARY DIVERSION/ INSERTION BILATERAL STENTS/ END COLOSTOMY.  APPLICATION OF WOUND VAC for colon cancer.   Clinical Impression   Pt presents to OT with decreased I with ADL activity s/p surgery and problems listed below.  Pt will benefit from skilled OT to increase I with ADL activity     OT Assessment  Patient needs continued OT Services    Follow Up Recommendations  Home health OT       Equipment Recommendations  Other (comment) (will decide about 3 n 1)       Frequency  Min 2X/week    Precautions / Restrictions Precautions Precaution Comments: abdominal drain, colostomy, urostomy,VAC       ADL  Grooming: Set up Where Assessed - Grooming: Supine, head of bed up Where Assessed - Upper Body Bathing: Unsupported sitting Lower Body Bathing: Maximal assistance Where Assessed - Lower Body Bathing: Supine, head of bed up Upper Body Dressing: Minimal assistance Where Assessed - Upper Body Dressing: Supine, head of bed up Lower Body Dressing: Maximal assistance Where Assessed - Lower Body Dressing: Supine, head of bed up Transfers/Ambulation Related to ADLs: Pt did decline OOB , but was very interested in OT topics. Educated pt in use of reacher. Pt will benefit from AE to increase I with LB dressing, as well as to adjust the sheets on his bed due to abdominal surgery     OT Problem List: Decreased strength;Decreased activity tolerance OT Treatment Interventions: Self-care/ADL training;Therapeutic activities;DME and/or AE instruction   OT Goals(Current goals can be found in the care plan  section) Acute Rehab OT Goals Patient Stated Goal: be able to get up and get me something to eat OT Goal Formulation: With patient Time For Goal Achievement: 11/20/13 Potential to Achieve Goals: Good ADL Goals Pt Will Perform Lower Body Dressing: with supervision;sit to/from stand Pt Will Perform Toileting - Clothing Manipulation and hygiene: with supervision;sit to/from stand  Visit Information  Last OT Received On: 11/06/13 Assistance Needed: +2 History of Present Illness: CYSTOSCOPY WITH RIGHT RETROGRADE STENT PLACEMENT , bladder biopsy, LOW ANTERIOR RESECTION WITH ENBLOCK CYSTECTOMY AND PROSTATECTOMY/ COLON CONDUIT URINARY DIVERSION/ INSERTION BILATERAL STENTS/ END COLOSTOMY.  APPLICATION OF WOUND VAC for colon cancer.       Prior [place] expects to be discharged to:: Private residence Living Arrangements: Spouse/significant other Available Help at Discharge: Family Type of Home: House Home Access: Stairs to enter Technical brewer of Steps: 5 Entrance Stairs-Rails: Right;Left Home Layout: One level Home Equipment: None Prior Function Level of Independence: Independent Communication Communication: No difficulties            Cognition  Cognition Arousal/Alertness: Awake/alert Behavior During Therapy: WFL for tasks assessed/performed Overall Cognitive Status: Within Functional Limits for tasks assessed       Mobility Bed Mobility Overal bed mobility: Needs Assistance Bed Mobility: Sidelying to Sit Sidelying to sit: Min assist;HOB elevated General bed mobility comments: HOB at 50*, Pt used rail Transfers Equipment used: Rolling walker (2 wheeled) Transfers: Sit to/from Stand Sit to Stand: Min assist;From elevated surface General transfer comment: [place].           End of Session  OT - End of Session Activity Tolerance: Patient tolerated treatment well Patient left: in bed;with call bell/phone within  reach Nurse Communication: Mobility status  GO     Betsy Pries 11/06/2013, 3:09 PM

## 2013-11-06 NOTE — Progress Notes (Signed)
3 Days Post-Op  Subjective:  1 - Locally Advanced Colon Cancer With Perforation / Abscess and Apparent Bladder Neck Involvement - s/p open cystoprostatectomy en bloc with colectomy + colon conduit + end colosotomy during combined general surgery - urology procedure 11/03/13. JP Cr same as serum 11/06/13.  Path pending.   Today Austin Robbins is doing well. Has been out of bed / ambulated, pain controlled. No wound problems.   Objective: Vital signs in last 24 hours: Temp:  [99.2 F (37.3 C)-100.2 F (37.9 C)] 99.2 F (37.3 C) (03/02 1334) Pulse Rate:  [79-89] 80 (03/02 1334) Resp:  [18-24] 20 (03/02 1600) BP: (121-131)/(62-71) 131/71 mmHg (03/02 1334) SpO2:  [95 %-98 %] 98 % (03/02 1600) Last BM Date: 11/05/13  Intake/Output from previous day: 03/01 0701 - 03/02 0700 In: 2032.7 [I.V.:712.7; TPN:1320] Out: 7353 [Urine:2950; Drains:525; Stool:50] Intake/Output this shift: Total I/O In: 240 [P.O.:240] Out: 890 [Urine:750; Drains:140]  General appearance: alert, cooperative and appears stated age Head: Normocephalic, without obvious abnormality, atraumatic Eyes: negative Ears: Nl external appearance Neck: supple, symmetrical, trachea midline Back: symmetric, no curvature. ROM normal. No CVA tenderness. Resp: no labored breathing Chest wall: no tenderness Cardio: regular rate and rhythm GI: soft, non-tender; bowel sounds normal; no masses,  no organomegaly and colostomy LLQ pink and patent Male genitalia: normal Extremities: extremities normal, atraumatic, no cyanosis or edema Pulses: 2+ and symmetric Skin: Skin color, texture, turgor normal. No rashes or lesions Lymph nodes: Cervical, supraclavicular, and axillary nodes normal. Neurologic: Grossly normal Incision/Wound: RLQ Urostomy pink / patent with Bander stents in situ. UP with serosanguinous output. VAC in place in midline w/o skin erythema.  Lab Results:   Recent Labs  11/05/13 0342 11/06/13 0417  WBC 18.0* 15.4*  HGB 8.0*  8.0*  HCT 25.2* 25.9*  PLT 407* 403*   BMET  Recent Labs  11/05/13 0342 11/06/13 0417  NA 135* 136*  K 4.2 3.9  CL 98 100  CO2 25 24  GLUCOSE 165* 151*  BUN 19 13  CREATININE 1.28 0.62  CALCIUM 8.4 8.5   PT/INR No results found for this basename: LABPROT, INR,  in the last 72 hours ABG No results found for this basename: PHART, PCO2, PO2, HCO3,  in the last 72 hours  Studies/Results: No results found.  Anti-infectives: Anti-infectives   Start     Dose/Rate Route Frequency Ordered Stop   11/04/13 1600  ertapenem (INVANZ) 1 g in sodium chloride 0.9 % 50 mL IVPB     1 g 100 mL/hr over 30 Minutes Intravenous Every 24 hours 11/03/13 2049 11/11/13 1559   11/03/13 0600  cefoTEtan (CEFOTAN) 2 g in dextrose 5 % 50 mL IVPB     2 g 100 mL/hr over 30 Minutes Intravenous On call to O.R. 11/02/13 2007 11/03/13 0935   11/01/13 1800  ertapenem (INVANZ) 1 g in sodium chloride 0.9 % 50 mL IVPB  Status:  Discontinued    Comments:  Please don't start until blood cultures have been obtained today   1 g 100 mL/hr over 30 Minutes Intravenous Every 24 hours 11/01/13 1700 11/03/13 2049      Assessment/Plan:  1 - Locally Advanced Colon Cancer With Perforation /  Abscess and Apparent Bladder Neck Involvement - Doing very well from GU perspective. JP Cr same as serum confirms ureteral-colon anastamoses water tight. Bander stents to remain x several weeks, all other drain per gen surg with no restrictions to removal from GU perspective.   Will follow.  Austin Robbins,  Austin Robbins 11/06/2013

## 2013-11-06 NOTE — Progress Notes (Signed)
Physical Therapy Treatment Patient Details Name: Austin Robbins MRN: [id number] DOB: [birth year] Today's Date: 11/06/2013 Time: 3557-3220 PT Time Calculation (min): 23 min  PT Assessment / Plan / Recommendation  History of Present Illness CYSTOSCOPY WITH RIGHT RETROGRADE STENT PLACEMENT , bladder biopsy, LOW ANTERIOR RESECTION WITH ENBLOCK CYSTECTOMY AND PROSTATECTOMY/ COLON CONDUIT URINARY DIVERSION/ INSERTION BILATERAL STENTS/ END COLOSTOMY.  APPLICATION OF WOUND VAC for colon cancer.   PT Comments   Pt. Ambulated in  [place] today x 100'. Progressing.  Follow Up Recommendations  Home health PT     Does the patient have the potential to tolerate intense rehabilitation     Barriers to Discharge        Equipment Recommendations  Rolling walker with 5" wheels    Recommendations for Other Services    Frequency Min 3X/week   Progress towards PT Goals Progress towards PT goals: Progressing toward goals  Plan Current plan remains appropriate    Precautions / Restrictions Precautions Precaution Comments: abdominal drain, colostomy, urostomy,VAC   Pertinent Vitals/Pain Used PCA several times, Abd. Pain 8 when moving     Mobility  Bed Mobility Overal bed mobility: Needs Assistance Bed Mobility: Sidelying to Sit Sidelying to sit: Min assist;HOB elevated General bed mobility comments: HOB at 50*, Pt used rail Transfers Equipment used: Rolling walker (2 wheeled) Transfers: Sit to/from Stand Sit to Stand: Min assist;From elevated surface General transfer comment: CUES FOR SAFETY. Ambulation/Gait Ambulation/Gait assistance: Min assist Ambulation Distance (Feet): 100 Feet Assistive device: Rolling walker (2 wheeled) Gait Pattern/deviations: Step-through pattern;Wide base of support Gait velocity: slow General Gait Details: pt did support on RW today.    Exercises     PT Diagnosis:    PT Problem List:   PT Treatment Interventions:     PT Goals (current goals can now be found in  the care plan section)    Visit Information  Assistance Needed: +2 History of Present Illness: CYSTOSCOPY WITH RIGHT RETROGRADE STENT PLACEMENT , bladder biopsy, LOW ANTERIOR RESECTION WITH ENBLOCK CYSTECTOMY AND PROSTATECTOMY/ COLON CONDUIT URINARY DIVERSION/ INSERTION BILATERAL STENTS/ END COLOSTOMY.  APPLICATION OF WOUND VAC for colon cancer.    Subjective Data      Cognition  Cognition Arousal/Alertness: Awake/alert    Balance     End of Session PT - End of Session Activity Tolerance: Patient tolerated treatment well Patient left: in chair;with call bell/phone within reach;with family/visitor present Nurse Communication: Mobility status   GP     Austin Robbins 11/06/2013, 12:51 PM

## 2013-11-07 LAB — GLUCOSE, CAPILLARY
Glucose-Capillary: 124 mg/dL — ABNORMAL HIGH (ref 70–99)
Glucose-Capillary: 144 mg/dL — ABNORMAL HIGH (ref 70–99)
Glucose-Capillary: 144 mg/dL — ABNORMAL HIGH (ref 70–99)
Glucose-Capillary: 147 mg/dL — ABNORMAL HIGH (ref 70–99)
Glucose-Capillary: 152 mg/dL — ABNORMAL HIGH (ref 70–99)
Glucose-Capillary: 159 mg/dL — ABNORMAL HIGH (ref 70–99)

## 2013-11-07 LAB — CBC
HCT: 26.3 % — ABNORMAL LOW (ref 39.0–52.0)
Hemoglobin: 8.2 g/dL — ABNORMAL LOW (ref 13.0–17.0)
MCH: 24.6 pg — AB (ref 26.0–34.0)
MCHC: 31.2 g/dL (ref 30.0–36.0)
MCV: 78.7 fL (ref 78.0–100.0)
PLATELETS: 449 10*3/uL — AB (ref 150–400)
RBC: 3.34 MIL/uL — AB (ref 4.22–5.81)
RDW: 20.3 % — AB (ref 11.5–15.5)
WBC: 11.9 10*3/uL — AB (ref 4.0–10.5)

## 2013-11-07 LAB — BASIC METABOLIC PANEL
BUN: 12 mg/dL (ref 6–23)
CALCIUM: 8.4 mg/dL (ref 8.4–10.5)
CHLORIDE: 101 meq/L (ref 96–112)
CO2: 28 mEq/L (ref 19–32)
CREATININE: 0.53 mg/dL (ref 0.50–1.35)
GFR calc non Af Amer: >90 mL/min (ref >90)
Glucose, Bld: 159 mg/dL — ABNORMAL HIGH (ref 70–99)
Potassium: 4 mEq/L (ref 3.7–5.3)
Sodium: 138 mEq/L (ref 137–147)

## 2013-11-07 MED ORDER — FAT EMULSION 20 % IV EMUL
250.0000 mL | INTRAVENOUS | Status: AC
  Administered 2013-11-07: 250 mL via INTRAVENOUS
  Filled 2013-11-07: qty 250

## 2013-11-07 MED ORDER — M.V.I. ADULT IV INJ
INTRAVENOUS | Status: AC
  Administered 2013-11-07: 17:00:00 via INTRAVENOUS
  Filled 2013-11-07: qty 2400

## 2013-11-07 NOTE — Progress Notes (Signed)
Occupational Therapy Treatment Patient Details Name: BREWER HITCHMAN MRN: [id number] DOB: 1955-10-21 Today's Date: 11/07/2013 Time: 4650-3546 OT Time Calculation (min): 29 min  OT Assessment / Plan / Recommendation  History of present illness CYSTOSCOPY WITH RIGHT RETROGRADE STENT PLACEMENT , bladder biopsy, LOW ANTERIOR RESECTION WITH ENBLOCK CYSTECTOMY AND PROSTATECTOMY/ COLON CONDUIT URINARY DIVERSION/ INSERTION BILATERAL STENTS/ END COLOSTOMY.  APPLICATION OF WOUND VAC for colon cancer.   OT comments  Pt limited by pain but willing to work with OT. Educated further on AE options for LB self care and pt appreciative.  Follow Up Recommendations  Home health OT    Barriers to Discharge       Equipment Recommendations  Other (comment) (will decide on 3in1)    Recommendations for Other Services    Frequency Min 2X/week   Progress towards OT Goals Progress towards OT goals: Progressing toward goals (added new goals)  Plan Discharge plan remains appropriate    Precautions / Restrictions Precautions Precaution Comments: abdominal drain, colostomy, urostomy,VAC Restrictions Weight Bearing Restrictions: No   Pertinent Vitals/Pain 6/10 abdominal pain; PCA encouraged, informed nursing    ADL  Lower Body Dressing: Performed;Minimal assistance (used reacher to doff sock, sock aid to don sock, ) Where Assessed - Lower Body Dressing: Unsupported sitting Equipment Used: Rolling walker Transfers/Ambulation Related to ADLs: Encouraged up to chair to sit up for strengthening. Practiced wtih AE for LB self care and pt needed min assist to properly position reacher at heel to help slide sock off his foot. He then needed min assist to use sock aid to don sock. Explained AE coverage and where to obtain. He states he will consider AE options. If pt going home with colostomy and urostomy, wont need 3in1 unless desired for sitting to bathe or manage colostomy.     OT Diagnosis:    OT Problem List:    OT Treatment Interventions:     OT Goals(current goals can now be found in the care plan section)    Visit Information  Last OT Received On: 11/07/13 Assistance Needed: +2 History of Present Illness: CYSTOSCOPY WITH RIGHT RETROGRADE STENT PLACEMENT , bladder biopsy, LOW ANTERIOR RESECTION WITH ENBLOCK CYSTECTOMY AND PROSTATECTOMY/ COLON CONDUIT URINARY DIVERSION/ INSERTION BILATERAL STENTS/ END COLOSTOMY.  APPLICATION OF WOUND VAC for colon cancer.    Subjective Data      Prior Functioning       Cognition  Cognition Arousal/Alertness: Awake/alert Behavior During Therapy: WFL for tasks assessed/performed Overall Cognitive Status: Within Functional Limits for tasks assessed    Mobility  Bed Mobility Overal bed mobility: Needs Assistance Bed Mobility: Sidelying to Sit Rolling: Min guard Sidelying to sit: Min assist General bed mobility comments: HOB raised, used rail. slowly, verbal cues for technique. Transfers Overall transfer level: Needs assistance Equipment used: Rolling walker (2 wheeled) Transfers: Sit to/from Stand Sit to Stand: Min assist General transfer comment: verbal cues for safety    Exercises      Balance    End of Session OT - End of Session Equipment Utilized During Treatment: Rolling walker Activity Tolerance: Patient limited by pain Patient left: in chair;with call bell/phone within reach  GO     Jules Schick [phone] 11/07/2013, 10:21 AM

## 2013-11-07 NOTE — Progress Notes (Signed)
PARENTERAL NUTRITION CONSULT NOTE - Follow up  Pharmacy Consult for TNA Indication: bowel obstruction  Allergies  Allergen Reactions  . Codeine Other (See Comments)    Chest pain    Patient Measurements: Height: 6' (182.9 cm) Weight: 212 lb 1.3 oz (96.2 kg) IBW/kg (Calculated) : 77.6 Adjusted Body Weight: 81.5 kg Usual Weight: 112 kg  Vital Signs: Temp: 98.2 F (36.8 C) (03/03 0606) Temp src: Oral (03/03 0606) BP: 132/73 mmHg (03/03 0606) Pulse Rate: 75 (03/03 0606) Intake/Output from previous day: 03/02 0701 - 03/03 0700 In: 360 [P.O.:360] Out: 2785 [Urine:2325; Drains:460]  Labs:  Recent Labs  11/05/13 0342 11/06/13 0417 11/07/13 0615  WBC 18.0* 15.4* 11.9*  HGB 8.0* 8.0* 8.2*  HCT 25.2* 25.9* 26.3*  PLT 407* 403* 449*     Recent Labs  11/05/13 0342 11/06/13 0417 11/07/13 0615  NA 135* 136* 138  K 4.2 3.9 4.0  CL 98 100 101  CO2 25 24 28   GLUCOSE 165* 151* 159*  BUN 19 13 12   CREATININE 1.28 0.62 0.53  CALCIUM 8.4 8.5 8.4  MG 1.8 1.8  --   PHOS 4.1 3.5  --   PROT  --  5.3*  --   ALBUMIN  --  1.6*  --   AST  --  11  --   ALT  --  9  --   ALKPHOS  --  78  --   BILITOT  --  0.2*  --   PREALBUMIN  --  6.7*  --   TRIG  --  134  --    Estimated Creatinine Clearance: 122.5 ml/min (by C-G formula based on Cr of 0.53).    Recent Labs  11/06/13 2348 11/07/13 0343 11/07/13 0750  GLUCAP 145* 152* 144*    CBGs & Insulin requirements past 24 hours:   CBG range:  124-158  15 units Novolog SSI (q4h moderate scale)   Updated Nutritional Goals:   Per RD Rec's 2/26:  Kcal: 2200-2400, Protein: 120-140g, Fluid: 2.2-2.4L/day  Clinimix E 5/15 at goal rate of 100 ml/hr with fat emulsion 20% daily at 10 ml/hr will provide 120 g protein and 2184 kCal/day.    Current nutrition:  - Diet: FLD (started 3/1 AM) - TNA: Clinimix-E 5/15 100 mL/hr + Fat Emulsion 20% at 10 mL/hr - mIVF: NS @ 20 mL/hr  Assessment:  71 yoM admitted 2/25 for further  management of rectal carcinoma. On 2/27 underwent surgical resection of bowel/tumor and open cystoprostatectomy. TNA was started on 2/26 with pharmacy dosing assistance.  Patient does not have a hx of diabetes.   3/3: Day#6 TNA (2/26 - present).  Tolerating TNA without any noted complications.  Possible developing ileus; no significant ostomy output, will continue to minimize PO intake until colostomy functioning.  I/O not fully documented.  Entereg continues (start 2/28, max duration through 3/7 if not d/c earlier).  Labs: Electrolytes:  WNL  Renal Function: SCr improved, WNL. Hepatic Function: LFTs below ULN Pre-Albumin: 6.1 (2/26), 6.7 (3/2) Triglycerides: 134 (3/2) CBGs: improved after change in TPN formula to Clinimix-E 5/15; now < 150 on sliding scale insulin coverage  Access: PICC    Plan:  At 1800 tonight  Continue Clinimix E 5/15 at 100 ml/hr (goal rate)  Continue IV fat emulsion 20% at 10 ml/hr daily  Standard multivitamins daily,  trace elements MWF only given national backorder  Continue IVF at 20 ml/hr  Continue SSI moderate scale q4h  TNA labs Monday/Thursdays  F/U diet advancement  when appropriate  Gretta Arab PharmD, BCPS Pager [phone] 11/07/2013 8:58 AM

## 2013-11-07 NOTE — Consult Note (Signed)
WOC wound follow up Wound type:Surgical  Measurement:16cm x 2cm x 2.5cm Wound IOX:BDZH pink, no necrotic tissue Drainage (amount, consistency, odor) Scant serous Periwound:intact Dressing procedure/placement/frequency:NPWT at 1100mmHg intermittent pressure, 1 piece of black foam used as wound contact layer.  Drape fashioned to minimize overlap onto either ostomy site. As today is Tuesday, changed NPWT dressing change frequency to T/TH/Sa.  WOC ostomy follow up: Colostomy Stoma type/location: LLQ Colostomy Stomal assessment/size: 2 inches, round, moist, budded, edematous Peristomal assessment: intact, clear Treatment options for stomal/peristomal skin: skin barrier ring Output small amount serosanguinous effluent Ostomy pouching: 2pc. 2 and 1/4 inch pouching system with skin barrier ring Education provided: Patient taught stoma characteristics, pouch characteristics.  Premedicated for dressing and ostomy pouch initial changes and using PCA during session.  Closes eyes periodically. Enrolled patient in [facility] Start Discharge program: No  WOC ostomy follow up: Urostomy Stoma type/location: RLQ Stomal assessment/size: Oval, 1/2 at skin level, 1/2 slightly budded, edematous.  Two stents in place, red stent for right ureter, blue stent for left Peristomal assessment: intact with peristomal erythema, early pMASD (peristomal Moisture associated skin damage) Treatment options for stomal/peristomal skin:  Output  Ostomy pouching: 1pc./2pc.  Education provided: Patient taught that he may disconnect from bedside urinary drainage bag when OOB.  Instructed RN to allow patient to feel pouch filling when OOB.  Attach to bedside drainage bag when in bed.  Patient to practice attaching and disconnecting pouch from bedside drainage bag. Enrolled patient in [facility] Discharge program: No  Extended visit today, total time spent 1.5 hours.  [place] nursing team will follow, and will remain  available to this patient, the nursing and surgical teams.  Please re-consult if needed between visits. Thanks, Maudie Flakes, MSN, RN, [employer], [place], [place] [phone])

## 2013-11-07 NOTE — Progress Notes (Signed)
Patient ID: Austin Robbins, male   DOB: 12-25-1955, 58 y.o.   MRN: [id number] 4 Days Post-Op LAR, cystectomy, colon conduit, colostomy Subjective: Has some incisional pain.  PCA doing ok.  Denies nausea or vomiting, but does feel bloated and full. No significant ostomy output.  Objective: Vital signs in last 24 hours: Temp:  [98.1 F (36.7 C)-99.4 F (37.4 C)] 98.2 F (36.8 C) (03/03 0606) Pulse Rate:  [74-80] 75 (03/03 0606) Resp:  [18-21] 19 (03/03 0606) BP: (120-150)/(71-78) 132/73 mmHg (03/03 0606) SpO2:  [95 %-99 %] 96 % (03/03 0606) Last BM Date: 11/05/13  Intake/Output from previous day: 03/02 0701 - 03/03 0700 In: 360 [P.O.:360] Out: 2785 [Urine:2325; Drains:460] Intake/Output this shift:    General appearance: alert, cooperative and mild distress Resp: clear to auscultation bilaterally and  no increased work of breathing GI: firm with mild diffuse tenderness. Stoma appears healthy, edematous. No ostomy output. Incision/Wound: VAC in place, clean and dry. JP: serous fluid  Lab Results:   Recent Labs  11/06/13 0417 11/07/13 0615  WBC 15.4* 11.9*  HGB 8.0* 8.2*  HCT 25.9* 26.3*  PLT 403* 449*   BMET  Recent Labs  11/06/13 0417 11/07/13 0615  NA 136* 138  K 3.9 4.0  CL 100 101  CO2 24 28  GLUCOSE 151* 159*  BUN 13 12  CREATININE 0.62 0.53  CALCIUM 8.5 8.4     Studies/Results: No results found.  Anti-infectives: Anti-infectives   Start     Dose/Rate Route Frequency Ordered Stop   11/04/13 1600  ertapenem (INVANZ) 1 g in sodium chloride 0.9 % 50 mL IVPB     1 g 100 mL/hr over 30 Minutes Intravenous Every 24 hours 11/03/13 2049 11/11/13 1559   11/03/13 0600  cefoTEtan (CEFOTAN) 2 g in dextrose 5 % 50 mL IVPB     2 g 100 mL/hr over 30 Minutes Intravenous On call to O.R. 11/02/13 2007 11/03/13 0935   11/01/13 1800  ertapenem (INVANZ) 1 g in sodium chloride 0.9 % 50 mL IVPB  Status:  Discontinued    Comments:  Please don't start until blood  cultures have been obtained today   1 g 100 mL/hr over 30 Minutes Intravenous Every 24 hours 11/01/13 1700 11/03/13 2049      Assessment/Plan: s/p Procedure(s): OPEN EN BLOC PELVIC  RESECTION: CYSTOSCOPY WITH RIGHT RETROGRADE STENT PLACEMENT , bladder biopsy,TOTAL PROSTATE WITH ENBLOCK CYSTECTOMY AND PROCTECTOMY/ COLON CONDUIT URINARY DIVERSION/ INSERTION BILATERAL STENTS/ COLOSTOMY APPLICATION OF WOUND VAC Appear stable postoperatively. Seems to be developing an ileus, counseled pt to minimize PO intake until colostomy functioning better Blood loss anemia and anemia of chronic disease, Hgb stable.  Will start Iron supplements when having BM's Protein calorie malnutrition: TPN per pharmacy Gradually increase activity. Start PO narcotics   LOS: 6 days    Phillips Goulette C. 10/12/5807

## 2013-11-07 NOTE — Progress Notes (Signed)
4 Days Post-Op  Subjective:  1 - Locally Advanced Colon Cancer With Perforation / Abscess and Apparent Bladder Neck Involvement - s/p open cystoprostatectomy en bloc with colectomy + colon conduit + end colosotomy during combined general surgery - urology procedure 11/03/13. JP Cr same as serum 11/06/13.  Path pending.   Today Austin Robbins is stable. Colostomy with more output during day today. No nausea / emesis.  Objective: Vital signs in last 24 hours: Temp:  [98.2 F (36.8 C)-99.4 F (37.4 C)] 98.2 F (36.8 C) (03/03 1400) Pulse Rate:  [72-78] 72 (03/03 1400) Resp:  [16-21] 16 (03/03 1600) BP: (132-150)/(73-80) 132/80 mmHg (03/03 1400) SpO2:  [96 %-99 %] 97 % (03/03 1600) Last BM Date: 11/05/13  Intake/Output from previous day: 03/02 0701 - 03/03 0700 In: 360 [P.O.:360] Out: 2785 [Urine:2325; Drains:460] Intake/Output this shift: Total I/O In: -  Out: 810 [Urine:650; Drains:160]  General appearance: alert, cooperative and appears stated age Head: Normocephalic, without obvious abnormality, atraumatic Neck: supple, symmetrical, trachea midline Back: symmetric, no curvature. ROM normal. No CVA tenderness. Resp: no labored breathing GI: soft, non-tender; bowel sounds normal; no masses,  no organomegaly and LLQ Colosomy pink / patent. Stool in bag, minimal gas. + vigorous bowel sounds Male genitalia: normal, RLQ Urostomy pink / patent with bander stents in situ and clear urine in bag. Extremities: extremities normal, atraumatic, no cyanosis or edema Pulses: 2+ and symmetric Skin: Skin color, texture, turgor normal. No rashes or lesions Neurologic: Grossly normal Incision/Wound: Midline VAC c/d/i.  Lab Results:   Recent Labs  11/06/13 0417 11/07/13 0615  WBC 15.4* 11.9*  HGB 8.0* 8.2*  HCT 25.9* 26.3*  PLT 403* 449*   BMET  Recent Labs  11/06/13 0417 11/07/13 0615  NA 136* 138  K 3.9 4.0  CL 100 101  CO2 24 28  GLUCOSE 151* 159*  BUN 13 12  CREATININE 0.62 0.53   CALCIUM 8.5 8.4   PT/INR No results found for this basename: LABPROT, INR,  in the last 72 hours ABG No results found for this basename: PHART, PCO2, PO2, HCO3,  in the last 72 hours  Studies/Results: No results found.  Anti-infectives: Anti-infectives   Start     Dose/Rate Route Frequency Ordered Stop   11/04/13 1600  ertapenem (INVANZ) 1 g in sodium chloride 0.9 % 50 mL IVPB     1 g 100 mL/hr over 30 Minutes Intravenous Every 24 hours 11/03/13 2049 11/11/13 1559   11/03/13 0600  cefoTEtan (CEFOTAN) 2 g in dextrose 5 % 50 mL IVPB     2 g 100 mL/hr over 30 Minutes Intravenous On call to O.R. 11/02/13 2007 11/03/13 0935   11/01/13 1800  ertapenem (INVANZ) 1 g in sodium chloride 0.9 % 50 mL IVPB  Status:  Discontinued    Comments:  Please don't start until blood cultures have been obtained today   1 g 100 mL/hr over 30 Minutes Intravenous Every 24 hours 11/01/13 1700 11/03/13 2049      Assessment/Plan:  1 - Locally Advanced Colon Cancer With Perforation /  Abscess and Apparent Bladder Neck Involvement - Doing very well from GU perspective. JP Cr same as serum confirms ureteral-colon anastamoses water tight. Bander stents to remain x several weeks, all other drain per gen surg with no restrictions to removal from GU perspective.   Will continue to follow.  [facility], Austin Robbins 11/07/2013

## 2013-11-08 LAB — BASIC METABOLIC PANEL
BUN: 12 mg/dL (ref 6–23)
CALCIUM: 8.5 mg/dL (ref 8.4–10.5)
CO2: 27 mEq/L (ref 19–32)
CREATININE: 0.57 mg/dL (ref 0.50–1.35)
Chloride: 103 mEq/L (ref 96–112)
GFR calc Af Amer: >90 mL/min (ref >90)
GFR calc non Af Amer: >90 mL/min (ref >90)
GLUCOSE: 139 mg/dL — AB (ref 70–99)
Potassium: 4.2 mEq/L (ref 3.7–5.3)
Sodium: 140 mEq/L (ref 137–147)

## 2013-11-08 LAB — GLUCOSE, CAPILLARY
Glucose-Capillary: 114 mg/dL — ABNORMAL HIGH (ref 70–99)
Glucose-Capillary: 121 mg/dL — ABNORMAL HIGH (ref 70–99)
Glucose-Capillary: 125 mg/dL — ABNORMAL HIGH (ref 70–99)
Glucose-Capillary: 125 mg/dL — ABNORMAL HIGH (ref 70–99)
Glucose-Capillary: 140 mg/dL — ABNORMAL HIGH (ref 70–99)
Glucose-Capillary: 99 mg/dL (ref 70–99)

## 2013-11-08 LAB — CULTURE, BLOOD (ROUTINE X 2)
CULTURE: NO GROWTH
Culture: NO GROWTH

## 2013-11-08 LAB — CBC
HCT: 26.8 % — ABNORMAL LOW (ref 39.0–52.0)
HEMOGLOBIN: 8 g/dL — AB (ref 13.0–17.0)
MCH: 23.5 pg — AB (ref 26.0–34.0)
MCHC: 29.9 g/dL — ABNORMAL LOW (ref 30.0–36.0)
MCV: 78.8 fL (ref 78.0–100.0)
Platelets: 423 10*3/uL — ABNORMAL HIGH (ref 150–400)
RBC: 3.4 MIL/uL — ABNORMAL LOW (ref 4.22–5.81)
RDW: 20.2 % — AB (ref 11.5–15.5)
WBC: 11 10*3/uL — ABNORMAL HIGH (ref 4.0–10.5)

## 2013-11-08 MED ORDER — INSULIN ASPART 100 UNIT/ML ~~LOC~~ SOLN: 0.0000 [IU] | Freq: Every day | SUBCUTANEOUS | Status: DC

## 2013-11-08 MED ORDER — HYDROMORPHONE HCL PF 1 MG/ML IJ SOLN
0.5000 mg | INTRAMUSCULAR | Status: DC | PRN
  Administered 2013-11-08 (×2): 0.5 mg via INTRAVENOUS
  Filled 2013-11-08 (×2): qty 1

## 2013-11-08 MED ORDER — TRACE MINERALS CR-CU-F-FE-I-MN-MO-SE-ZN IV SOLN
INTRAVENOUS | Status: DC
  Administered 2013-11-08: 18:00:00 via INTRAVENOUS
  Filled 2013-11-08: qty 1000

## 2013-11-08 MED ORDER — ENSURE PUDDING PO PUDG
1.0000 | Freq: Three times a day (TID) | ORAL | Status: DC
  Administered 2013-11-08 – 2013-11-09 (×4): 1 via ORAL
  Filled 2013-11-08 (×6): qty 1

## 2013-11-08 MED ORDER — FAT EMULSION 20 % IV EMUL
250.0000 mL | INTRAVENOUS | Status: DC
  Administered 2013-11-08: 250 mL via INTRAVENOUS
  Filled 2013-11-08: qty 250

## 2013-11-08 MED ORDER — INSULIN ASPART 100 UNIT/ML ~~LOC~~ SOLN
0.0000 [IU] | Freq: Three times a day (TID) | SUBCUTANEOUS | Status: DC
  Administered 2013-11-08 – 2013-11-09 (×3): 2 [IU] via SUBCUTANEOUS

## 2013-11-08 MED ORDER — INSULIN ASPART 100 UNIT/ML ~~LOC~~ SOLN: 0.0000 [IU] | Freq: Three times a day (TID) | SUBCUTANEOUS | Status: DC

## 2013-11-08 NOTE — Consult Note (Addendum)
WOC ostomy follow up Stoma type/location: RLQ colon conduit (urostomy) and LLQ Colostomy. Both pouches applied yesterday intact. Stomal assessment/size: Per notes yesterday Peristomal assessment:  Per notes yesterday Treatment options for stomal/peristomal skin: Per notes yesterday; both ostomies with skin barrier rings in place beneath pouching systems. Output Clear golden urine and serosanguinous effluent via colostomy, no function Ostomy pouching: 1pc.Urostomy; 2pc Colostomy.  Education provided: Patient did not review Scientist, clinical (histocompatibility and immunogenetics) and has no questions at this time.  No family in room yesterday afternoon or this morning. Enrolled patient in [place] Start Discharge program: Yes \\WOC  nursing team will follow, and will remain available to this patient, the nursing and surgical teams.  I will be out of the country for two weeks beginning tomorrow and my partners will cover in my absence. Thanks, HOREB, MSN, RN, [place], [place], [place] [phone])

## 2013-11-08 NOTE — Progress Notes (Signed)
PARENTERAL NUTRITION CONSULT NOTE - Follow up  Pharmacy Consult for TNA Indication: bowel obstruction  Allergies  Allergen Reactions  . Codeine Other (See Comments)    Chest pain    Patient Measurements: Height: 6' (182.9 cm) Weight: 212 lb 1.3 oz (96.2 kg) IBW/kg (Calculated) : 77.6 Adjusted Body Weight: 81.5 kg Usual Weight: 112 kg  Vital Signs: Temp: 98.4 F (36.9 C) (03/04 0557) Temp src: Oral (03/04 0557) BP: 125/67 mmHg (03/04 0557) Pulse Rate: 73 (03/04 0557) Intake/Output from previous day: 03/03 0701 - 03/04 0700 In: -  Out: 2930 [Urine:2150; Drains:480; Stool:300]  Labs:  Recent Labs  11/06/13 0417 11/07/13 0615 11/08/13 0400  WBC 15.4* 11.9* 11.0*  HGB 8.0* 8.2* 8.0*  HCT 25.9* 26.3* 26.8*  PLT 403* 449* 423*     Recent Labs  11/06/13 0417 11/07/13 0615 11/08/13 0400  NA 136* 138 140  K 3.9 4.0 4.2  CL 100 101 103  CO2 24 28 27   GLUCOSE 151* 159* 139*  BUN 13 12 12   CREATININE 0.62 0.53 0.57  CALCIUM 8.5 8.4 8.5  MG 1.8  --   --   PHOS 3.5  --   --   PROT 5.3*  --   --   ALBUMIN 1.6*  --   --   AST 11  --   --   ALT 9  --   --   ALKPHOS 78  --   --   BILITOT 0.2*  --   --   PREALBUMIN 6.7*  --   --   TRIG 134  --   --    Estimated Creatinine Clearance: 122.5 ml/min (by C-G formula based on Cr of 0.57).    Recent Labs  11/07/13 2339 11/08/13 0349 11/08/13 0729  GLUCAP 124* 125* 125*    CBGs & Insulin requirements past 24 hours:   CBG range:  125-159  15 units Novolog SSI (q4h moderate scale)   Updated Nutritional Goals:   Per RD Rec's 2/26:  Kcal: 2200-2400, Protein: 120-140g, Fluid: 2.2-2.4L/day  Clinimix E 5/15 at goal rate of 100 ml/hr with fat emulsion 20% daily at 10 ml/hr will provide 120 g protein and 2184 kCal/day.    Current nutrition:  - Diet: soft diet - started 3/4 - TNA: Clinimix-E 5/15 100 mL/hr + Fat Emulsion 20% at 10 mL/hr - mIVF: NS @ 20 mL/hr  Assessment:  73 yoM admitted 2/25 for further  management of rectal carcinoma. On 2/27 underwent surgical resection of bowel/tumor and open cystoprostatectomy. TNA was started on 2/26 with pharmacy dosing assistance.  Patient does not have a hx of diabetes.   3/3: Day#7 TNA (2/26 - present).  Tolerating TNA without any noted complications.  Good UOP, colostomy functioning better with 300 mL output per documentation and Md is advancing diet with plan to decrease TNA rate in half tonight.  Entereg continues (start 2/28, max duration through 3/7 if not d/c earlier).  Labs: Electrolytes:  WNL  Renal Function: SCr improved, WNL. Hepatic Function: LFTs below ULN Pre-Albumin: 6.1 (2/26), 6.7 (3/2) Triglycerides: 134 (3/2) CBGs: improved after change in TPN formula to Clinimix-E 5/15; now < 150 on sliding scale insulin coverage  Access: PICC    Plan:  At 1800 tonight  Reduce TNA from goal rate 174ml/hr to 40 ml/hr tonight  Continue IV fat emulsion 20% at 10 ml/hr daily  Standard multivitamins daily,  trace elements MWF only given national backorder  Continue IVF at 20 ml/hr  Change SSI  to with meals  TNA labs Monday/Thursdays  Hopeful to d/c TNA tomorrow after reducing rate tonight  Adrian Saran, PharmD, BCPS Pager [phone] 11/08/2013 10:16 AM

## 2013-11-08 NOTE — Progress Notes (Signed)
Physical Therapy Treatment Patient Details Name: Austin Robbins MRN: [id number] DOB: 1956/03/31 Today's Date: 11/08/2013 Time: 4656-8127 PT Time Calculation (min): 12 min  PT Assessment / Plan / Recommendation  History of Present Illness CYSTOSCOPY WITH RIGHT RETROGRADE STENT PLACEMENT , bladder biopsy, LOW ANTERIOR RESECTION WITH ENBLOCK CYSTECTOMY AND PROSTATECTOMY/ COLON CONDUIT URINARY DIVERSION/ INSERTION BILATERAL STENTS/ END COLOSTOMY.  APPLICATION OF WOUND VAC for colon cancer.   PT Comments   Pt will benefit from continued PT to work on high level balance and activity tolerance  Follow Up Recommendations  Home health PT     Does the patient have the potential to tolerate intense rehabilitation     Barriers to Discharge        Equipment Recommendations  Rolling walker with 5" wheels (vs cane)    Recommendations for Other Services    Frequency Min 3X/week   Progress towards PT Goals Progress towards PT goals: Progressing toward goals  Plan Current plan remains appropriate    Precautions / Restrictions Precautions Precautions: Fall Precaution Comments: abdominal drain, colostomy, urostomy,VAC   Pertinent Vitals/Pain Min c/o abd pain    Mobility  Bed Mobility Overal bed mobility: Needs Assistance Bed Mobility: Sidelying to Sit Sidelying to sit: Min guard General bed mobility comments: HOB raised, used rail. slowly, verbal cues for technique. Transfers Overall transfer level: Needs assistance Equipment used: Rolling walker (2 wheeled) Transfers: Sit to/from Stand Sit to Stand: Min guard General transfer comment: verbal cues for safety Ambulation/Gait Ambulation/Gait assistance: Min guard Ambulation Distance (Feet): 400 Feet Assistive device: Rolling walker (2 wheeled) Gait Pattern/deviations: Step-through pattern;Decreased stride length Gait velocity: slow General Gait Details: pt with LOB x 5 with min to min-guard recovery     Exercises     PT Diagnosis:     PT Problem List:   PT Treatment Interventions:     PT Goals (current goals can now be found in the care plan section) Acute Rehab PT Goals Patient Stated Goal: be able to get up and get me something to eat PT Goal Formulation: With patient Time For Goal Achievement: 11/18/13 Potential to Achieve Goals: Good  Visit Information  Last PT Received On: 11/08/13 Assistance Needed: +2 History of Present Illness: CYSTOSCOPY WITH RIGHT RETROGRADE STENT PLACEMENT , bladder biopsy, LOW ANTERIOR RESECTION WITH ENBLOCK CYSTECTOMY AND PROSTATECTOMY/ COLON CONDUIT URINARY DIVERSION/ INSERTION BILATERAL STENTS/ END COLOSTOMY.  APPLICATION OF WOUND VAC for colon cancer.    Subjective Data  Patient Stated Goal: be able to get up and get me something to eat   Cognition  Cognition Arousal/Alertness: Awake/alert Behavior During Therapy: WFL for tasks assessed/performed Overall Cognitive Status: Within Functional Limits for tasks assessed    Balance     End of Session PT - End of Session Activity Tolerance: Patient tolerated treatment well Patient left: in chair;with call bell/phone within reach Nurse Communication: Mobility status   GP     [facility] 11/08/2013, 4:25 PM

## 2013-11-08 NOTE — Progress Notes (Addendum)
Patient ID: Austin Robbins, male   DOB: 06-Sep-1956, 58 y.o.   MRN: [id number] 5 Days Post-Op LAR, cystectomy, colon conduit, colostomy Subjective: Pain better, tolerated PO meds.  Denies nausea or vomiting, but does feel bloated. ostomy output better.    Objective: Vital signs in last 24 hours: Temp:  [98.2 F (36.8 C)-98.9 F (37.2 C)] 98.4 F (36.9 C) (03/04 0557) Pulse Rate:  [72-75] 73 (03/04 0557) Resp:  [16-23] 20 (03/04 0718) BP: (125-132)/(67-80) 125/67 mmHg (03/04 0557) SpO2:  [96 %-98 %] 98 % (03/04 0718) Last BM Date: 11/07/13  Intake/Output from previous day: 03/03 0701 - 03/04 0700 In: -  Out: 2930 [Urine:2150; Drains:480; Stool:300] Intake/Output this shift:    General appearance: alert, cooperative and mild distress Resp: clear to auscultation bilaterally and  no increased work of breathing GI: firm with mild diffuse tenderness. Stoma appears healthy, edematous. No ostomy output. Incision/Wound: VAC in place, clean and dry. JP: serous fluid  Lab Results:   Recent Labs  11/07/13 0615 11/08/13 0400  WBC 11.9* 11.0*  HGB 8.2* 8.0*  HCT 26.3* 26.8*  PLT 449* 423*   BMET  Recent Labs  11/07/13 0615 11/08/13 0400  NA 138 140  K 4.0 4.2  CL 101 103  CO2 28 27  GLUCOSE 159* 139*  BUN 12 12  CREATININE 0.53 0.57  CALCIUM 8.4 8.5     Studies/Results: No results found.  Anti-infectives: Anti-infectives   Start     Dose/Rate Route Frequency Ordered Stop   11/04/13 1600  ertapenem (INVANZ) 1 g in sodium chloride 0.9 % 50 mL IVPB     1 g 100 mL/hr over 30 Minutes Intravenous Every 24 hours 11/03/13 2049 11/11/13 1559   11/03/13 0600  cefoTEtan (CEFOTAN) 2 g in dextrose 5 % 50 mL IVPB     2 g 100 mL/hr over 30 Minutes Intravenous On call to O.R. 11/02/13 2007 11/03/13 0935   11/01/13 1800  ertapenem (INVANZ) 1 g in sodium chloride 0.9 % 50 mL IVPB  Status:  Discontinued    Comments:  Please don't start until blood cultures have been obtained today    1 g 100 mL/hr over 30 Minutes Intravenous Every 24 hours 11/01/13 1700 11/03/13 2049      Assessment/Plan: s/p Procedure(s): OPEN EN BLOC PELVIC  RESECTION: CYSTOSCOPY WITH RIGHT RETROGRADE STENT PLACEMENT , bladder biopsy,TOTAL PROSTATE WITH ENBLOCK CYSTECTOMY AND PROCTECTOMY/ COLON CONDUIT URINARY DIVERSION/ INSERTION BILATERAL STENTS/ COLOSTOMY APPLICATION OF WOUND VAC Appear stable postoperatively.  Good UOP  colostomy functioning better, will advance diet Blood loss anemia and anemia of chronic disease, Hgb stable.  Will start Iron supplements when having BM's, d/c lab daily lab draws Protein calorie malnutrition: TPN per pharmacy, diet advanced to soft foods, run tpn at half rate tonight. Gradually increase activity. Cont PO narcotics, d/c PCA Pelvic infection: Cont Invanz 7 days post op Reviewed path.  Will need f/u with oncology    LOS: 7 days    Timothy Townsel C. 04/11/6313

## 2013-11-09 LAB — GLUCOSE, CAPILLARY
GLUCOSE-CAPILLARY: 99 mg/dL (ref 70–99)
GLUCOSE-CAPILLARY: 98 mg/dL (ref 70–99)
GLUCOSE-CAPILLARY: 131 mg/dL — AB (ref 70–99)
Glucose-Capillary: 117 mg/dL — ABNORMAL HIGH (ref 70–99)
Glucose-Capillary: 121 mg/dL — ABNORMAL HIGH (ref 70–99)
Glucose-Capillary: 122 mg/dL — ABNORMAL HIGH (ref 70–99)

## 2013-11-09 LAB — COMPREHENSIVE METABOLIC PANEL
ALBUMIN: 1.6 g/dL — AB (ref 3.5–5.2)
ALK PHOS: 99 U/L (ref 39–117)
ALT: 29 U/L (ref 0–53)
AST: 21 U/L (ref 0–37)
BILIRUBIN TOTAL: 0.2 mg/dL — AB (ref 0.3–1.2)
BUN: 14 mg/dL (ref 6–23)
CHLORIDE: 102 meq/L (ref 96–112)
CO2: 28 mEq/L (ref 19–32)
Calcium: 8.2 mg/dL — ABNORMAL LOW (ref 8.4–10.5)
Creatinine, Ser: 0.67 mg/dL (ref 0.50–1.35)
GFR calc Af Amer: >90 mL/min (ref >90)
GFR calc non Af Amer: >90 mL/min (ref >90)
GLUCOSE: 101 mg/dL — AB (ref 70–99)
Potassium: 4.2 mEq/L (ref 3.7–5.3)
SODIUM: 138 meq/L (ref 137–147)
Total Protein: 5.2 g/dL — ABNORMAL LOW (ref 6.0–8.3)

## 2013-11-09 LAB — MAGNESIUM: Magnesium: 1.7 mg/dL (ref 1.5–2.5)

## 2013-11-09 LAB — PHOSPHORUS: Phosphorus: 4.1 mg/dL (ref 2.3–4.6)

## 2013-11-09 MED ORDER — ENSURE COMPLETE PO LIQD
237.0000 mL | Freq: Three times a day (TID) | ORAL | Status: DC
  Administered 2013-11-09 – 2013-11-10 (×4): 237 mL via ORAL

## 2013-11-09 NOTE — Consult Note (Signed)
WOC ostomy follow up Stoma type/location: RLQ colon conduit (urostomy) and LLQ Colostomy. Both pouches applied 11/07/13 intact.  Output:  Both Urostomy and colostomy have output in pouch.  Both have intact seal.  Patient would like to do teaching when wife is present tomorrow.  VAC dressing intact and will do all 3 tomorrow to get patient on Mon-Wed-Fri schedule.  Surgeon has been through already today.  Ostomy pouching: 1 pc. Urostomy, connected to nighttime drainage,  2pc. Colostomy. Both  with barrier ring for added convexity.  Education provided: Discussed treatment plan with patient who prefers to wait until tomorrow for wife to be present.  Enrolled patient in [place] Start Discharge program: Yes [place] will continue to follow for ostomy education and support. Domenic Moras RN BSN [place] Pager [phone]

## 2013-11-09 NOTE — Progress Notes (Signed)
NUTRITION FOLLOW UP  Intervention:   - Ensure Complete TID - Educated pt about post-op diet for colostomy, pt expressed understanding - Will continue to monitor   Nutrition Dx:   Altered GI function related to perforated rectum with multiple pelvic abscesses as evidenced by MD notes - improving    Goal:   TPN to meet >90% of estimated nutrition needs - not met, TPN d/c  New goal: Pt to consume >90% of meals/supplements   Monitor:   Weights, labs, intake  Assessment:   Pt admitted with multiple pelvic abscesses along with some extraluminal air consistent with a rectal perforation from his rectal adenocarcinoma. The patient states that over the last 2-3 months he's had an unintentional 45 pound weight loss. He has developed night sweats since December. He admits to having a change in his bowel habits starting in November. These became more "mushy" and liquid-like. He began having a decrease in his appetite; however, he would still be about 2 meals a day. He noticed a very small amount of blood on his toilet paper but never and over amount in the toilet or his stool. He began running fevers in December and has had fever since then with no cessation. He denies ever having a colonoscopy before last week. He also denies any family history of GI or rectal cancer. He currently denies any abdominal bloating, nausea, or vomiting. Plan is for TPN which will start today. Plan is for surgical resection of tumor with evacuation of infection in his pelvis and colostomy.   2/26 - Met with pt who reports good appetite for the past 2-3 weeks however intake has been <75% energy needs. Pt eating things like grits/eggs for breakfast, a sandwich for lunch, and an occasional [employer]. Does not eat dinner. Pt reports in December 2014 he had some stomach problems where he lost his appetite and had to force himself to eat. Pt's only complaint this morning was groin pain. Denies any decreased muscle  strength. Nutrition focused physical exam of upper body was WNL.   3/5 - Had colon resection with creation of colostomy with cystoscopy with right retrograde stent placement and application of wound VAC, total prostate with cystectomy and prostatectomy with colon conduit urinary diversion and insertion of bilateral stents 2/27. TPN d/c today. Diet advanced to regular this morning, previously on soft and full liquid diets which pt was tolerating well. Met with pt who reports eating most of his emails and drinking Ensure, had 2 yesterday. Denies any pain or nausea after eating. Noted pt's weight up 13 pounds since admission.   Height: Ht Readings from Last 1 Encounters:  11/01/13 6' (1.829 m)    Weight Status:   Wt Readings from Last 1 Encounters:  11/04/13 212 lb 1.3 oz (96.2 kg)  Admit wt:        199 lb 15. 3 oz (90.7 kg)  Re-estimated needs:  Kcal: 2200-2400  Protein: 120-140g  Fluid: 2.2-2.4L/day   Skin: abdominal wound VAC   Diet Order: General   Intake/Output Summary (Last 24 hours) at 11/09/13 1324 Last data filed at 11/09/13 1200  Gross per 24 hour  Intake    120 ml  Output   3620 ml  Net  -3500 ml    Last BM: 3/5   Labs:   Recent Labs Lab 11/05/13 0342 11/06/13 0417 11/07/13 0615 11/08/13 0400 11/09/13 0445  NA 135* 136* 138 140 138  K 4.2 3.9 4.0 4.2 4.2  CL 98 100  101 103 102  CO2 _0 BUN _1 CREATININE 1.28 0.62 0.53 0.57 0.67  CALCIUM 8.4 8.5 8.4 8.5 8.2*  MG 1.8 1.8  --   --  1.7  PHOS 4.1 3.5  --   --  4.1  GLUCOSE 165* 151* 159* 139* 101*    CBG (last 3)   Recent Labs  11/09/13 0738 11/09/13 1211 11/09/13 1222  GLUCAP 98 131* 117*    Scheduled Meds: . enoxaparin (LOVENOX) injection  40 mg Subcutaneous Q24H  . ertapenem  1 g Intravenous Q24H  . feeding supplement (ENSURE)  1 Container Oral TID BM  . insulin aspart  0-15 Units Subcutaneous TID WC  . insulin aspart  0-5 Units Subcutaneous QHS  . lip balm  1  application Topical BID  . pantoprazole  40 mg Oral Q1200  . saccharomyces boulardii  250 mg Oral BID    Mikey College MS, RD, LDN [phone] Pager [phone] After Hours Pager

## 2013-11-09 NOTE — Progress Notes (Signed)
6 Days Post-Op  Subjective:  1 - Locally Advanced Colon Cancer With Perforation / Abscess and Apparent Bladder Neck Involvement - s/p open cystoprostatectomy en bloc with colectomy + colon conduit + end colosotomy during combined general surgery - urology procedure 11/03/13. JP Cr same as serum 11/06/13.  Path pT4N2 with extensive bladder and prostate involvment.   Today Austin Robbins is without complaints. Colosomy output improving substantially, no nausea / emesis.  Objective: Vital signs in last 24 hours: Temp:  [98 F (36.7 C)-98.2 F (36.8 C)] 98 F (36.7 C) (03/05 0558) Pulse Rate:  [68-95] 68 (03/05 0558) Resp:  [18-20] 18 (03/05 0558) BP: (105-117)/(62-79) 117/79 mmHg (03/05 0558) SpO2:  [94 %-98 %] 97 % (03/05 0558) Last BM Date: 11/08/13 (From Ostomy)  Intake/Output from previous day: 03/04 0701 - 03/05 0700 In: 0  Out: 3540 [Urine:2150; Drains:440; Stool:950] Intake/Output this shift:    General appearance: alert, cooperative and appears stated age Head: Normocephalic, without obvious abnormality, atraumatic Throat: lips, mucosa, and tongue normal; teeth and gums normal Back: symmetric, no curvature. ROM normal. No CVA tenderness. Resp: no labored breathing Cardio: regular rate and rhythm, S1, S2 normal, no murmur, click, rub or gallop GI: soft, non-tender; bowel sounds normal; no masses,  no organomegaly Male genitalia: normal Extremities: extremities normal, atraumatic, no cyanosis or edema Pulses: 2+ and symmetric Skin: Skin color, texture, turgor normal. No rashes or lesions Lymph nodes: Cervical, supraclavicular, and axillary nodes normal. Neurologic: Grossly normal Incision/Wound: RLQ Urostomy pink / patent with bander stents in place and clear yellow urine + mucus in appliance. VAC in place with aparrant clean wound edges. LLQ colsootmy pink / patent with stool + gas in appliance. No penile or rectal discharge.  Lab Results:   Recent Labs  11/07/13 0615  11/08/13 0400  WBC 11.9* 11.0*  HGB 8.2* 8.0*  HCT 26.3* 26.8*  PLT 449* 423*   BMET  Recent Labs  11/08/13 0400 11/09/13 0445  NA 140 138  K 4.2 4.2  CL 103 102  CO2 27 28  GLUCOSE 139* 101*  BUN 12 14  CREATININE 0.57 0.67  CALCIUM 8.5 8.2*   PT/INR No results found for this basename: LABPROT, INR,  in the last 72 hours ABG No results found for this basename: PHART, PCO2, PO2, HCO3,  in the last 72 hours  Studies/Results: No results found.  Anti-infectives: Anti-infectives   Start     Dose/Rate Route Frequency Ordered Stop   11/04/13 1600  ertapenem (INVANZ) 1 g in sodium chloride 0.9 % 50 mL IVPB     1 g 100 mL/hr over 30 Minutes Intravenous Every 24 hours 11/03/13 2049 11/11/13 1559   11/03/13 0600  cefoTEtan (CEFOTAN) 2 g in dextrose 5 % 50 mL IVPB     2 g 100 mL/hr over 30 Minutes Intravenous On call to O.R. 11/02/13 2007 11/03/13 0935   11/01/13 1800  ertapenem (INVANZ) 1 g in sodium chloride 0.9 % 50 mL IVPB  Status:  Discontinued    Comments:  Please don't start until blood cultures have been obtained today   1 g 100 mL/hr over 30 Minutes Intravenous Every 24 hours 11/01/13 1700 11/03/13 2049      Assessment/Plan:  1 - Locally Advanced Colon Cancer With Perforation /  Abscess and Apparent Bladder Neck Involvement - Doing very well from GU perspective. JP Cr same as serum confirms ureteral-colon anastamoses water tight. Bander stents to remain x several weeks, all other drain per gen surg with  no restrictions to removal from GU perspective. Discussed path with pt including confirmed gross GU involvement, confirming cystoprostatectomy correct procedure to maximally resect local disease.   Will continue to follow.  [facility], [name] 11/09/2013

## 2013-11-09 NOTE — Progress Notes (Signed)
Patient ID: Austin Robbins, male   DOB: 11/20/1955, 58 y.o.   MRN: [id number] 6 Days Post-Op LAR, cystectomy, colon conduit, colostomy Subjective: Doing well.  Pain controlled.  Tolerating soft diet but "food just doesn't taste good"  Objective: Vital signs in last 24 hours: Temp:  [98 F (36.7 C)-98.2 F (36.8 C)] 98 F (36.7 C) (03/05 0558) Pulse Rate:  [68-95] 68 (03/05 0558) Resp:  [18-20] 18 (03/05 0558) BP: (105-117)/(62-79) 117/79 mmHg (03/05 0558) SpO2:  [94 %-98 %] 97 % (03/05 0558) Last BM Date: 11/08/13 (From Ostomy)  Intake/Output from previous day: 03/04 0701 - 03/05 0700 In: 0  Out: 3540 [Urine:2150; Drains:440; Stool:950] Intake/Output this shift:   General appearance: alert, cooperative and mild distress Resp:  no increased work of breathing GI: soft with mild diffuse tenderness. Stoma appears healthy, less edematous. Good ostomy output. Incision/Wound: VAC in place, clean and dry. JP: serous fluid  Lab Results:   Recent Labs  11/07/13 0615 11/08/13 0400  WBC 11.9* 11.0*  HGB 8.2* 8.0*  HCT 26.3* 26.8*  PLT 449* 423*   BMET  Recent Labs  11/08/13 0400 11/09/13 0445  NA 140 138  K 4.2 4.2  CL 103 102  CO2 27 28  GLUCOSE 139* 101*  BUN 12 14  CREATININE 0.57 0.67  CALCIUM 8.5 8.2*     Studies/Results: No results found.  Anti-infectives: Anti-infectives   Start     Dose/Rate Route Frequency Ordered Stop   11/04/13 1600  ertapenem (INVANZ) 1 g in sodium chloride 0.9 % 50 mL IVPB     1 g 100 mL/hr over 30 Minutes Intravenous Every 24 hours 11/03/13 2049 11/11/13 1559   11/03/13 0600  cefoTEtan (CEFOTAN) 2 g in dextrose 5 % 50 mL IVPB     2 g 100 mL/hr over 30 Minutes Intravenous On call to O.R. 11/02/13 2007 11/03/13 0935   11/01/13 1800  ertapenem (INVANZ) 1 g in sodium chloride 0.9 % 50 mL IVPB  Status:  Discontinued    Comments:  Please don't start until blood cultures have been obtained today   1 g 100 mL/hr over 30 Minutes  Intravenous Every 24 hours 11/01/13 1700 11/03/13 2049      Assessment/Plan: s/p Procedure(s): OPEN EN BLOC PELVIC  RESECTION: CYSTOSCOPY WITH RIGHT RETROGRADE STENT PLACEMENT , bladder biopsy,TOTAL PROSTATE WITH ENBLOCK CYSTECTOMY AND PROCTECTOMY/ COLON CONDUIT URINARY DIVERSION/ INSERTION BILATERAL STENTS/ COLOSTOMY APPLICATION OF WOUND VAC Appear stable postoperatively.  Good UOP  colostomy functioning, tolerating a diet Blood loss anemia and anemia of chronic disease, Hgb stable.  Will start Iron supplements upon d/c Protein calorie malnutrition:Pt tolerating a diet.  Will d/c tpn Gradually increase activity. Cont PO narcotics for pain control  Pelvic infection: Cont Invanz 7 days post op, tomorrow is last dose Reviewed path.  Will need f/u with oncology    LOS: 8 days    Austin Robbins C. 02/11/3418

## 2013-11-10 LAB — GLUCOSE, CAPILLARY
GLUCOSE-CAPILLARY: 136 mg/dL — AB (ref 70–99)
Glucose-Capillary: 91 mg/dL (ref 70–99)

## 2013-11-10 MED ORDER — OXYCODONE HCL 5 MG PO TABS: 5.0000 mg | ORAL_TABLET | ORAL | Status: DC | PRN

## 2013-11-10 NOTE — Consult Note (Signed)
WOC ostomy consult note Stoma type/location: LLQ Colostomy RLQ Urostomy (Colon Conduit) Midline surgical incision with VAC therapy.  Stomal assessment/size:  2 inch Colostomy, well budded, 3 cm from midline incision.  1 3/8 inch urostomy with stents, flush.   Midline abdominal incision: 12 cm x 4 cm x 2 cm 100% beefy red granulation tissue wound bed.  Peristomal assessment: Colostomy:  Peristomal skin intact; Urostomy peristomal skin reddened, intact. Midline peristomal skin intact.  Treatment options for stomal/peristomal skin:  Barrier ring to both stomas and stoma powder to urostomy. Skin prep to Midline abdominal wound. Output Colostomy with loose, brown stool, urine in colon conduit pouch, attached to nighttime drainage. Ostomy pouching: 1pc urostomy ./2pc.colostomy  Education provided:  Discussed measuring stoma at each pouch change for six weeks after surgery, cleansing with mild soap and water, avoid baby wipes and lotion.  Stressed awareness of stents and pouching.  Demonstrated connecting and disconnecting from nighttime drainage bag when out of bed.  Educated to empty pouches when 1/3 full.  Patient complaining of gas and discussed use of bags with filter at home.  Will call [facility] and ensure that colostomy pouch with filter sample is mailed to patient.  VAC dressing change to midline abdominal site without difficulty.  Patient will be on Mon-Wed-Fri change schedule with home Health.  Will not follow at this time.  Please re-consult if needed.  Domenic Moras RN BSN [place] Pager [phone]

## 2013-11-10 NOTE — Progress Notes (Signed)
OT Cancellation Note  Patient Details Name: Austin Robbins MRN: [id number] DOB: 05-11-1956   Cancelled Treatment:    Reason Eval/Treat Not Completed: Other (comment).  Pt plans home and verbalizes understanding of AE.  He states he got socks on without AE.  He does not need toilet DME due to colostomy.  Will sign off in acute.    Tanairy Payeur 11/10/2013, 1:35 PM Lesle Chris, OTR/L [phone] 11/10/2013

## 2013-11-10 NOTE — Progress Notes (Signed)
Per MD order, PICC line removed. Cath intact at 46cm. Vaseline pressure gauze to site, pressure held x 5min. No bleeding to site. Pt instructed to keep dressing CDI x 24 hours. Avoid heavy lifting, pushing or pulling x 24 hours,  If bleeding occurs hold pressure, if bleeding does not stop contact MD or go to the ED. Pt does not have any questions. Vlasta Baskin M 

## 2013-11-10 NOTE — Progress Notes (Signed)
Physical Therapy Treatment Patient Details Name: DAXON KYNE MRN: [id number] DOB: 01-03-1956 Today's Date: 11/10/2013 Time: 7371-0626 PT Time Calculation (min): 8 min  PT Assessment / Plan / Recommendation  History of Present Illness CYSTOSCOPY WITH RIGHT RETROGRADE STENT PLACEMENT , bladder biopsy, LOW ANTERIOR RESECTION WITH ENBLOCK CYSTECTOMY AND PROSTATECTOMY/ COLON CONDUIT URINARY DIVERSION/ INSERTION BILATERAL STENTS/ END COLOSTOMY.  APPLICATION OF WOUND VAC for colon cancer.   PT Comments   Pt will benefit from HHPT  Follow Up Recommendations  Home health PT     Does the patient have the potential to tolerate intense rehabilitation     Barriers to Discharge        Equipment Recommendations  None recommended by PT    Recommendations for Other Services    Frequency Min 3X/week   Progress towards PT Goals Progress towards PT goals: Progressing toward goals  Plan Current plan remains appropriate    Precautions / Restrictions Precautions Precautions: Fall Precaution Comments: abdominal drain, colostomy, urostomy,VAC   Pertinent Vitals/Pain VSS   Mobility  Bed Mobility Overal bed mobility: Modified Independent Transfers Overall transfer level: Modified independent Transfers: Sit to/from Stand Sit to Stand: Supervision;Modified independent (Device/Increase time) General transfer comment: verbal cues for safety occasionally Ambulation/Gait Ambulation/Gait assistance: Supervision Ambulation Distance (Feet): 500 Feet Assistive device: None Gait Pattern/deviations: Step-through pattern Stairs: Yes Stairs assistance: Min guard Stair Management: One rail Right;Forwards Number of Stairs: 4 General stair comments: cues to slow down for safety and line management    Exercises     PT Diagnosis:    PT Problem List:   PT Treatment Interventions:     PT Goals (current goals can now be found in the care plan section) Acute Rehab PT Goals Patient Stated Goal: be able  to get up and get me something to eat Time For Goal Achievement: 11/18/13 Potential to Achieve Goals: Good  Visit Information  Last PT Received On: 11/10/13 Assistance Needed: +1 History of Present Illness: CYSTOSCOPY WITH RIGHT RETROGRADE STENT PLACEMENT , bladder biopsy, LOW ANTERIOR RESECTION WITH ENBLOCK CYSTECTOMY AND PROSTATECTOMY/ COLON CONDUIT URINARY DIVERSION/ INSERTION BILATERAL STENTS/ END COLOSTOMY.  APPLICATION OF WOUND VAC for colon cancer.    Subjective Data  Patient Stated Goal: be able to get up and get me something to eat   Cognition  Cognition Arousal/Alertness: Awake/alert Behavior During Therapy: WFL for tasks assessed/performed Overall Cognitive Status: Within Functional Limits for tasks assessed    Balance     End of Session PT - End of Session Activity Tolerance: Patient tolerated treatment well Patient left: in chair;with call bell/phone within reach Nurse Communication: Mobility status   GP     [facility] 11/10/2013, 1:45 PM

## 2013-11-10 NOTE — Discharge Summary (Signed)
Physician Discharge Summary  Patient ID: Austin Robbins MRN: [id number] DOB/AGE: Aug 11, 1956 58 y.o.  Admit date: 11/01/2013 Discharge date: 11/10/2013  Admission Diagnoses: perforated rectal cancer  Discharge Diagnoses:  Principal Problem:   Rectal adenocarcinoma with perforation & invasion into bladder s/p LAR/cystectomy/colon conduit 11/04/2013 Active Problems:   Intra-abdominal abscess   Pyuria   Anemia of chronic disease   Panic attacks   Gastritis   Discharged Condition: stable  Hospital Course: The patient was admitted through the emergency department with localize perforation of a known rectal cancer. He was placed on IV antibiotics and a picc line was placed for TPN. He was found to be malnourished with a decreased albumin level. He was also found to be anemic and was given two units of blood preoperatively.  A consult to urology was placed to evaluate for urologic involvement.  Once he was medically stabilized, he was taken to the operating room. He was found to have a rectal cancer invading into the prostate and neck of the bladder. A total pelvic exenteration was performed.  He required another two units of blood intraoperatively. The patient had an end colostomy and a colonic urostomy. He was sent to step down in stable condition. He did well overnight. His urine output was excellent.  He was transferred to the floor also in stable condition. Over the next few days his diet was slowly without adverse events. He began to have ostomy output. He was switched to oral narcotics and physical therapy began to work with him. He underwent ostomy education as well. By postop day seven the patient was determined to be in stable condition for discharge to home.  He will have a home health nurse to assist with his needs. His mother is also a Marine scientist and will help assist him. He will go home with a JP drain in place that he will need to empty once daily. He will also go home with a wound vac to his  midline incision.  Consults: urology  Significant Diagnostic Studies: labs: CBC and chemistry  Treatments: IV hydration, antibiotics: invanz, analgesia: Dilaudid, TPN, therapies: PT, OT and WOC and surgery: see above  Discharge Exam: Blood pressure 121/75, pulse 77, temperature 98.8 F (37.1 C), temperature source Oral, resp. rate 18, height 6' (1.829 m), weight 212 lb 1.3 oz (96.2 kg), SpO2 98.00%. General appearance: alert and cooperative GI: normal findings: soft, non-tender Incision/Wound: vac in place Ostomies: pink, viable, less edematous JP: cloudy, serous fluid  Disposition: 01-Home or Self Care   Future Appointments Provider Department Dept Phone   11/27/2013 9:30 AM [facility] Nurse [facility] [phone]   11/27/2013 10:00 AM Marye Round, [facility] [phone]   11/30/2013 1:30 PM [facility] [phone]   11/30/2013 2:00 PM Ladell Pier, [facility] [phone]       Medication List    STOP taking these medications       ibuprofen 200 MG tablet  Commonly known as:  ADVIL,MOTRIN     sulfamethoxazole-trimethoprim 800-160 MG per tablet  Commonly known as:  BACTRIM DS      TAKE these medications       acetaminophen 500 MG tablet  Commonly known as:  TYLENOL  Take 1,000 mg by mouth every 6 (six) hours as needed (pain).     ALPRAZolam 0.25 MG tablet  Commonly known as:  XANAX  Take  1 tablet (0.25 mg total) by mouth 2 (two) times daily as needed for anxiety.     ferrous gluconate 324 MG tablet  Commonly known as:  FERGON  Take 324 mg by mouth daily.     oxyCODONE 5 MG immediate release tablet  Commonly known as:  Oxy IR/ROXICODONE  Take 1-2 tablets (5-10 mg total) by mouth every 4 (four) hours as needed for moderate pain, severe pain or breakthrough pain.     traMADol 50 MG tablet   Commonly known as:  ULTRAM  Take 50 mg by mouth every 8 (eight) hours as needed (pain).           Follow-up Information   Follow up with Rosario Adie., MD In 2 weeks.   Specialty:  General Surgery   Contact information:   [address] [place] [address] [phone]       Signed: Rosario Adie 12/11/2701, 5:00 AM

## 2013-11-10 NOTE — Progress Notes (Signed)
7 Days Post-Op  Subjective:  1 - Locally Advanced Colon Cancer With Perforation / Abscess and Apparent Bladder Neck Involvement - s/p open cystoprostatectomy en bloc with colectomy + colon conduit + end colosotomy during combined general surgery - urology procedure 11/03/13. JP Cr same as serum 11/06/13.  Path pT4N2 with extensive bladder and prostate involvment.   Today Austin Robbins is set up for discharge. He has been ambulatory, without wound issues, and receiving education from wound ostomy RN's. Tolerating regular diet.   Objective: Vital signs in last 24 hours: Temp:  [98.7 F (37.1 C)-99.4 F (37.4 C)] 98.8 F (37.1 C) (03/06 0623) Pulse Rate:  [73-77] 77 (03/06 0623) Resp:  [18] 18 (03/06 0623) BP: (114-130)/(74-82) 121/75 mmHg (03/06 0623) SpO2:  [98 %-99 %] 98 % (03/06 0623) Last BM Date: 11/09/13  Intake/Output from previous day: 03/05 0701 - 03/06 0700 In: 600 [P.O.:600] Out: 3035 [Urine:1875; Drains:360; Stool:800] Intake/Output this shift: Total I/O In: -  Out: 75 [Drains:75]  General appearance: alert, cooperative and appears stated age Head: Normocephalic, without obvious abnormality, atraumatic Neck: supple, symmetrical, trachea midline Back: symmetric, no curvature. ROM normal. No CVA tenderness. Resp: non-labored Chest wall: no tenderness Cardio: normal rate GI: soft, non-tender; bowel sounds normal; no masses,  no organomegaly Extremities: extremities normal, atraumatic, no cyanosis or edema Pulses: 2+ and symmetric Skin: Skin color, texture, turgor normal. No rashes or lesions Lymph nodes: Cervical, supraclavicular, and axillary nodes normal. Neurologic: Grossly normal Incision/Wound: LLQ colostomy pink / patent with gas + stool in appliacne. RLQ urostomy pink / patent with clear yellow urine and red + blue bander stents in situ. VAC now off and base of wound with excellent granulation. No hernias / drainage.  Lab Results:   Recent Labs  11/08/13 0400  WBC  11.0*  HGB 8.0*  HCT 26.8*  PLT 423*   BMET  Recent Labs  11/08/13 0400 11/09/13 0445  NA 140 138  K 4.2 4.2  CL 103 102  CO2 27 28  GLUCOSE 139* 101*  BUN 12 14  CREATININE 0.57 0.67  CALCIUM 8.5 8.2*   PT/INR No results found for this basename: LABPROT, INR,  in the last 72 hours ABG No results found for this basename: PHART, PCO2, PO2, HCO3,  in the last 72 hours  Studies/Results: No results found.  Anti-infectives: Anti-infectives   Start     Dose/Rate Route Frequency Ordered Stop   11/04/13 1600  ertapenem (INVANZ) 1 g in sodium chloride 0.9 % 50 mL IVPB     1 g 100 mL/hr over 30 Minutes Intravenous Every 24 hours 11/03/13 2049 11/11/13 1559   11/03/13 0600  cefoTEtan (CEFOTAN) 2 g in dextrose 5 % 50 mL IVPB     2 g 100 mL/hr over 30 Minutes Intravenous On call to O.R. 11/02/13 2007 11/03/13 0935   11/01/13 1800  ertapenem (INVANZ) 1 g in sodium chloride 0.9 % 50 mL IVPB  Status:  Discontinued    Comments:  Please don't start until blood cultures have been obtained today   1 g 100 mL/hr over 30 Minutes Intravenous Every 24 hours 11/01/13 1700 11/03/13 2049      Assessment/Plan:  1 - Locally Advanced Colon Cancer With Perforation /  Abscess and Apparent Bladder Neck Involvement - Doing very well from GU perspective. JP Cr same as serum confirms ureteral-colon anastamoses water tight. Bander stents to remain x several weeks, all other drain per gen surg with no restrictions to removal from GU perspective.  Agree safe for DC home. We will arrange GU follow-up for wound and ostomy check in about 2 weeks.   [facility], [name] 11/10/2013

## 2013-11-10 NOTE — Discharge Instructions (Addendum)
ABDOMINAL SURGERY: POST OP INSTRUCTIONS  1. DIET: Follow a light bland diet the first 24 hours after arrival home, such as soup, liquids, crackers, etc.  Be sure to include lots of fluids daily.  Avoid fast food or heavy meals as your are more likely to get nauseated.  Do not eat any uncooked fruits or vegetables for the next 2 weeks as your colon heals. 2. Take your usually prescribed home medications unless otherwise directed. 3. PAIN CONTROL: a. Pain is best controlled by a usual combination of three different methods TOGETHER: i. Ice/Heat ii. Over the counter pain medication iii. Prescription pain medication b. Most patients will experience some swelling and bruising around the incisions.  Ice packs or heating pads (30-60 minutes up to 6 times a day) will help. Use ice for the first few days to help decrease swelling and bruising, then switch to heat to help relax tight/sore spots and speed recovery.  Some people prefer to use ice alone, heat alone, alternating between ice & heat.  Experiment to what works for you.  Swelling and bruising can take several weeks to resolve.   c. It is helpful to take an over-the-counter pain medication regularly for the first few weeks.  Choose one of the following that works best for you: i. Naproxen (Aleve, etc)  Two 243m tabs twice a day ii. Ibuprofen (Advil, etc) Three 2036mtabs four times a day (every meal & bedtime) iii. Acetaminophen (Tylenol, etc) 500-65045mour times a day (every meal & bedtime) d. A  prescription for pain medication (such as oxycodone, hydrocodone, etc) should be given to you upon discharge.  Take your pain medication as prescribed.  i. If you are having problems/concerns with the prescription medicine (does not control pain, nausea, vomiting, rash, itching, etc), please call us [phone] see if we need to switch you to a different pain medicine that will work better for you and/or control your side effect better. ii. If you  need a refill on your pain medication, please contact your pharmacy.  They will contact our office to request authorization. Prescriptions will not be filled after 5 pm or on week-ends. 4. Avoid getting constipated.  Between the surgery and the pain medications, it is common to experience some constipation.  Increasing fluid intake and taking a fiber supplement (such as Metamucil, Citrucel, FiberCon, MiraLax, etc) 1-2 times a day regularly will usually help prevent this problem from occurring.  A mild laxative (prune juice, Milk of Magnesia, MiraLax, etc) should be taken according to package directions if there are no bowel movements after 48 hours.   5. Watch out for diarrhea.  If you have many loose bowel movements, simplify your diet to bland foods & liquids for a few days.  Stop any stool softeners and decrease your fiber supplement.  Switching to mild anti-diarrheal medications (Kayopectate, Pepto Bismol) can help.  If this worsens or does not improve, please call us.[place]. Wash / shower every day.  You may shower over the incision / wound.  Avoid baths until the skin is fully healed.  Continue to shower over incision(s) after the dressing is off. 7. Remove your waterproof bandages 5 days after surgery.  You may leave the incision open to air.  You may replace a dressing/Band-Aid to cover the incision for comfort if you wish. 8. ACTIVITIES as tolerated:   a. You may resume regular (light) daily activities beginning the next day--such as daily self-care, walking, climbing stairs--gradually increasing activities as  tolerated.  If you can walk 30 minutes without difficulty, it is safe to try more intense activity such as jogging, treadmill, bicycling, low-impact aerobics, swimming, etc. b. Save the most intensive and strenuous activity for last such as sit-ups, heavy lifting, contact sports, etc  Refrain from any heavy lifting or straining until you are off narcotics for pain control.   c. DO NOT PUSH THROUGH  PAIN.  Let pain be your guide: If it hurts to do something, don't do it.  Pain is your body warning you to avoid that activity for another week until the pain goes down. d. You may drive when you are no longer taking prescription pain medication, you can comfortably wear a seatbelt, and you can safely maneuver your car and apply brakes. e. Dennis Bast may have sexual intercourse when it is comfortable.  9. FOLLOW UP in our office a. Please call [facility] at [phone] to set up an appointment to see your surgeon in the office for a follow-up appointment approximately 1-2 weeks after your surgery. b. Make sure that you call for this appointment the day you arrive home to insure a convenient appointment time. 10. IF YOU HAVE DISABILITY OR FAMILY LEAVE FORMS, BRING THEM TO THE OFFICE FOR PROCESSING.  DO NOT GIVE THEM TO YOUR DOCTOR. 11. Empty JP drain daily.  Record output.  Bring these recordings with you to your office apts  WHEN TO CALL us [phone]: 1. Poor pain control 2. Reactions / problems with new medications (rash/itching, nausea, etc)  3. Fever over 101.5 F (38.5 C) 4. Inability to urinate 5. Nausea and/or vomiting 6. Worsening swelling or bruising 7. Continued bleeding from incision. 8. Increased pain, redness, or drainage from the incision  The clinic staff is available to answer your questions during regular business hours (8:30am-5pm).  Please dont hesitate to call and ask to speak to one of our nurses for clinical concerns.   A surgeon from [facility] is always on call at the hospitals   If you have a medical emergency, go to the nearest emergency room or call 911.    [facility], [address], [place], [place]  [address] ? MAIN: [phone] ? TOLL FREE: [phone] ? FAX [phone] [email or web address]      Ostomy  WHAT IS AN OSTOMY? An ostomy is a surgically created opening connecting an internal organ  to the surface of the body. Different kinds of ostomies are named for the organ involved. The most common types of ostomies in intestinal surgery are an "ileostomy" (connecting the ileal part of the small intestine to the abdominal wall) and a "colostomy" (connecting the colon, or, large intestine to the abdominal wall). An ostomy may be temporary or permanent. A temporary ostomy may be required if the intestinal tract can't be properly prepared for surgery because of blockage by disease or scar tissue. A temporary ostomy may also be created to allow inflammation or an operative site to heal without contamination by stool. Temporary ostomies can usually be reversed with minimal or no loss of intestinal function. A permanent ostomy may be required when disease, or its treatment, impairs normal intestinal function, or when the muscles that control elimination do not work properly or require removal. The most common causes of these conditions are low rectal cancer and inflammatory bowel disease.   An ostomy connects either the small or the large intestine to the surface of the body.   HOW WILL I  CONTROL MY BOWEL MOVEMENTS? Once your ostomy has been created, your surgeon or wound ostomy continence nurse (a [place] specializes in ostomy care) will teach you to attach and care for a pouch called an ostomy appliance. An ostomy appliance, or pouch, is designed to catch eliminated fecal material (stool). The pouch is made of plastic and is held to the body with an adhesive. The adhesive, in turn, protects the skin from moisture. The pouch is disposable and is emptied or changed as needed. The system is quite secure; "accidents" are not common, and the pouches are odor-free. Your bowel movements will naturally empty into the pouch. The frequency and quantity of your bowel movements will vary, depending on the type of ostomy you have, your diet, and your bowel habits prior to surgery. You may be instructed to modify  your eating habits in order to control the frequency and consistency of your bowel movements. If the ostomy is a colostomy, irrigation techniques may be learned which allow for increased control over the timing of bowel movements.     An ostomy appliance is a plastic pouch, held to the body with an adhesive skin barrier, that provides secure and odor-free control of bowel movements.   WILL OTHER PEOPLE KNOW THAT I HAVE AN OSTOMY? Not unless you tell them. An ostomy is easily hidden by your usual clothing. You probably have met people with an ostomy and not realized it! WHERE WILL THE OSTOMY BE? An ostomy is best placed on a flat portion of the abdominal wall. Before undergoing surgery to create an ostomy, it is best for your surgeon or [place] nurse to mark an appropriate place on your abdominal wall not constricted by your belt-line. A colostomy is usually placed to the left of your navel and an ileostomy to the right. WILL MY PHYSICAL ACTIVITIES BE LIMITED? The answer to this question is usually no. Public figures, prominent entertainers, and even professional athletes have ostomies that do not significantly limit their activities. All your usual activities, including active sports, may be resumed once healing from surgery is complete. WILL AN OSTOMY AFFECT MY SEX LIFE? Most patients with ostomies resume their usual sexual activity. Many people with ostomies worry about how their sexual partner will think of them because of their appliance. This perceived change in one's body image can be overcome by a strong relationship, time and patience. Support groups are also available in many cities. WHAT ARE THE COMPLICATIONS OF AN OSTOMY? Complications from an ostomy can occur. Most, like local skin irritation are typically minor and can be easily remedied. Problems such as a hernia associated with the ostomy or prolapse of the ostomy (a protrusion of the bowel) occasionally require surgery if they cause  significant symptoms. Weight loss or gain may affect the function of an ostomy. Living with an ostomy will require some adjustments and learning, but an active and fulfilling life is still possible and likely. Your colon and rectal surgeon and [place] nurse will provide you with skills and support to help you better live with your ostomy. WOULD YOU LIKE ADDITIONAL INFORMATION? More information about ostomies can be found at: www.uoaa.org - [employer] [place] [employer] [place] WHAT IS A COLON AND RECTAL SURGEON? Colon and rectal surgeons are experts in the surgical and non-surgical treatment of diseases of the colon, rectum and anus. They have completed advanced surgical training in the treatment of these diseases as well as full general surgical training. Board-certified colon and rectal surgeons complete residencies  in general surgery and colon and rectal surgery, and pass intensive examinations conducted by the American Board of Surgery and the American Board of Colon and Rectal Surgery. They are well-versed in the treatment of both benign and malignant diseases of the colon, rectum and anus and are able to perform routine screening examinations and surgically treat conditions if indicated to do so.  2012 American Society of Colon & Rectal Surgeons

## 2013-11-14 ENCOUNTER — Encounter (HOSPITAL_COMMUNITY): Payer: Self-pay

## 2013-11-23 ENCOUNTER — Ambulatory Visit (INDEPENDENT_AMBULATORY_CARE_PROVIDER_SITE_OTHER): Payer: 59 | Admitting: General Surgery

## 2013-11-23 VITALS — BP 130/84 | HR 76 | Temp 98.0°F | Resp 12 | Ht 72.0 in | Wt 198.8 lb

## 2013-11-23 DIAGNOSIS — C20 Malignant neoplasm of rectum: Secondary | ICD-10-CM

## 2013-11-23 NOTE — Progress Notes (Signed)
Austin Robbins is a 58 y.o. male who is status post a pelvic exenteration on 2/28 for rectal cancer invading the prostate and bladder.  He reports a good appetite. He denies any high-grade fevers.  He occasionally has a low-grade fever. His JP output has been serous and clear and minimal (less than 25 cc a day).  He is having good bowel movements and has started to gain some weight back. He denies any trouble with his urostomy. He is packing his abdominal midline incision once a day. He is having some erythema around the drain site. It is causing him some pain. He occasionally takes narcotics.  His chest CT is positive for mediastinal nodes as well.  Objective: Filed Vitals:   11/23/13 1549  BP: 130/84  Pulse: 76  Temp: 98 F (36.7 C)  Resp: 12    General appearance: alert and cooperative GI: normal findings: soft, non-tender Urostomy and colostomy appeared pink and viable. Both are functioning well. Incision: healing well   Assessment: s/p  Patient Active Problem List   Diagnosis Date Noted  . Panic attacks   . Gastritis   . Intra-abdominal abscess 11/01/2013  . Pyuria 11/01/2013  . Anemia of chronic disease 11/01/2013  . Rectal adenocarcinoma with perforation & invasion into bladder s/p LAR/cystectomy/colon conduit 11/04/2013 11/01/2013  . Fever 10/26/2013  . Personal history of colonic polyps - adenoma 10/25/2013  Surgical Pathology Colon, segmental resection for tumor, and bladder and prostate - INVASIVE POORLY DIFFERENTIATED ADENOCARCINOMA, INVADING THROUGH THE RECTAL WALL AND INVOLVING THE ADJACENT PROSTATE TISSUE AND BLADDER WALL AND GROSSLY PRESENT AT THE PERIRECTAL SOFT TISSUE MARGIN AT MULTIPLE FOCI. - SEVEN OF SIXTEEN LYMPH NODES, POSITIVE FOR METASTATIC CARCINOMA (7/16). pT4b R2, pN2b, pMX Plan: He seems to be doing well after surgery.  We will remove his JP drain. I will see him back in a couple weeks. He is scheduled to see Dr. Benay Spice and Dr. Tresa Moore next week.   When I see him back in 2 weeks, I suspect he will be ready for port placement.  His overall prognosis seems grim.      Rosario Adie, [facility], Person   11/23/2013 4:32 PM

## 2013-11-24 ENCOUNTER — Telehealth: Payer: Self-pay | Admitting: *Deleted

## 2013-11-24 ENCOUNTER — Encounter: Payer: Self-pay | Admitting: *Deleted

## 2013-11-24 NOTE — Progress Notes (Unsigned)
GU Location of Tumor / Histology: Rectal Cancer     Biopsies of  11/03/13:1. Bladder, biopsy- FIBROUS TISSUE WITH ASSOCIATED ACUTE AND CHRONIC INFLAMMATION.- NO EVIDENCE OF MALIGNANCY. 2. Soft tissue, biopsy, anterior rectal- POSITIVE FOR INVASIVE ADENOCARCINOMA.3. Colon, segmental resection for tumor, and bladder and prostate - INVASIVE POORLY DIFFERENTIATED ADENOCARCINOMA, INVADING THROUGH THE RECTALWALL AND INVOLVING THE ADJACENT PROSTATE TISSUE AND BLADDER WALL AND GROSSLY PRESENT AT THE PERIRECTAL SOFT TISSUE MARGIN AT MULTIPLE FOCI.- SEVEN OF SIXTEEN LYMPH NODES, POSITIVE FOR METASTATIC CARCINOMA (7/16).- PLEASE SEE ONCOLOGY TEMPLATE FOR DETAILS.  Patient presented  months ago with signs/symptoms of: September, losing weight  Loose stools, no bleding   Past/Anticipated interventions by urology, if any:   Past/Anticipated interventions by medical oncology, if any: appt with Dr.Sherrill 11/30/13  Weight changes, if any:  40 lbs loss since September 2014,   Bowel/Bladder complaints, if any: has urostomy and colostomy, clear yellow urine, changes bag every couple hours, colostomy with soft stool  Nausea/Vomiting, if any: no  Pain issues, if any:  Slight discomfort abdomen  SAFETY ISSUES:  Prior radiation? No  Pacemaker/ICD? no    Is the patient on methotrexate? no  Current Complaints / other details:  11/01/13  Cystoscopy with right retrograde stent placement, LAR/cystectomy, ,colostomy,urostomy Dr. Leighton Ruff, follow up appt 12/14/13

## 2013-11-24 NOTE — Telephone Encounter (Signed)
Spoke with patient by phone and confirmed appointments with Dr. Benay Spice and Dr. Lisbeth Renshaw.  Contact numbers and directions provided.

## 2013-11-25 ENCOUNTER — Telehealth (INDEPENDENT_AMBULATORY_CARE_PROVIDER_SITE_OTHER): Payer: Self-pay | Admitting: General Surgery

## 2013-11-25 DIAGNOSIS — Z9889 Other specified postprocedural states: Secondary | ICD-10-CM

## 2013-11-27 ENCOUNTER — Ambulatory Visit
Admit: 2013-11-27 | Discharge: 2013-11-27 | Disposition: A | Discharge status: Home / Self Care | Payer: 59 | Attending: Radiation Oncology | Admitting: Radiation Oncology

## 2013-11-27 ENCOUNTER — Encounter: Payer: Self-pay | Admitting: Radiation Oncology

## 2013-11-27 ENCOUNTER — Telehealth: Payer: Self-pay | Admitting: Family Medicine

## 2013-11-27 VITALS — BP 143/89 | HR 78 | Temp 98.5°F | Resp 20 | Ht 72.0 in | Wt 202.2 lb

## 2013-11-27 DIAGNOSIS — C772 Secondary and unspecified malignant neoplasm of intra-abdominal lymph nodes: Secondary | ICD-10-CM | POA: Insufficient documentation

## 2013-11-27 DIAGNOSIS — C20 Malignant neoplasm of rectum: Secondary | ICD-10-CM

## 2013-11-27 DIAGNOSIS — C771 Secondary and unspecified malignant neoplasm of intrathoracic lymph nodes: Secondary | ICD-10-CM | POA: Insufficient documentation

## 2013-11-27 DIAGNOSIS — C19 Malignant neoplasm of rectosigmoid junction: Secondary | ICD-10-CM

## 2013-11-27 HISTORY — DX: Allergy, unspecified, initial encounter: T78.40XA

## 2013-11-27 MED ORDER — OXYCODONE HCL 5 MG PO TABS
5.0000 mg | ORAL_TABLET | ORAL | Status: DC | PRN
Start: 2013-11-27 — End: 2013-11-30

## 2013-11-27 NOTE — Progress Notes (Signed)
Please see the Nurse Progress Note in the MD Initial Consult Encounter for this patient. 

## 2013-11-27 NOTE — Telephone Encounter (Signed)
That's fine.  #[phone]

## 2013-11-27 NOTE — Telephone Encounter (Signed)
Referral made 

## 2013-11-27 NOTE — Telephone Encounter (Signed)
Patient has colorectal cancer and wants you to review what all he has gone through. They need a referral to the urologist Dr.manney at [facility] long please set up he has already had surgery. They have a list of doctors that he needs to see the list is on your desk. They want to make sure they want having any problems getting in with out referrals

## 2013-11-28 ENCOUNTER — Ambulatory Visit (INDEPENDENT_AMBULATORY_CARE_PROVIDER_SITE_OTHER): Payer: 59 | Admitting: General Surgery

## 2013-11-29 ENCOUNTER — Ambulatory Visit (INDEPENDENT_AMBULATORY_CARE_PROVIDER_SITE_OTHER): Payer: 59 | Admitting: General Surgery

## 2013-11-29 NOTE — Progress Notes (Signed)
Radiation Oncology         [phone]) [phone] ________________________________  Name: Austin Robbins MRN: [id number]  Date: 11/27/2013  DOB: Mar 20, 1956  FT:DDUKG, Elenore Rota, MD  Leighton Ruff, MD     REFERRING PHYSICIAN: Leighton Ruff, MD   DIAGNOSIS: The encounter diagnosis was Rectal adenocarcinoma.   HISTORY OF PRESENT ILLNESS::Austin Robbins is a 58 y.o. male who is seen for an initial consultation visit. The patient is seen today regarding his diagnosis of metastatic rectal cancer. He indicates that he began having some abdominal discomfort in December. He was initially treated with H. Pylori. He then subsequently was also treated with prostatitis and treated with antibiotics. His symptoms persisted and therefore he was referred to GI medicine.  The patient underwent a colonoscopy and was found to have a large rectal mass at that time. This revealed a circumferential mass within the rectum and the mass was palpable on digital rectal exam. Biopsy was obtained and this returned positive for adenocarcinoma. The patient underwent CT imaging as part of his workup. A large rectal mass was seen consistent with rectal carcinoma. Evidence of breakdown of the rectal wall was seen with several large gas and stool collections within the pelvis. Local nodal metastasis was seen with multiple enlarged abnormal periectal and mesocolonic nodes.  More distal metastasis was seen with metastatic mediastinal and hilar lymph nodes.  The patient proceeded with surgical resection which ended up representing a pelvic exenteration due to the extent of the tumor. This demonstrated an invasive poorly differentiated adenocarcinoma which invaded through the rectal wall and involved the adjacent prostate tissue and bladder wall. The tumor was grossly present at the perirectal soft tissue margin at multiple foci. 7 of 16 lymph nodes for her positive for metastatic carcinoma. The distal and proximal margins were negative. This  pathologically therefore represented a T4bN2b tumor/ R2 resection.  This was completed on 11/01/2013.  The patient has been recovering from this procedure. He still has an open abdominal wound but the patient states that this has been healing relatively well. He had a JP drain removed within the last week.   PREVIOUS RADIATION THERAPY: No   PAST MEDICAL HISTORY:  has a past medical history of H. pylori infection (08/07/2013); Anemia; Gastritis; Panic attacks; Adenocarcinoma of rectum (10/25/2013); Personal history of colonic polyps - adenoma (10/25/2013); Colon cancer (11/03/13); and Allergy.     PAST SURGICAL HISTORY: Past Surgical History  Procedure Laterality Date  . Tonsillectomy    . Low anterior bowel resection  11/04/2013  . Cystectomy w/ ureterosigmoidostomy  11/04/2013  . Bowel resection N/A 11/03/2013    Procedure: OPEN ENCOLOSTOMY /COLON RESECTION/COLOSTOMY;  Surgeon: Leighton Ruff, MD;  Location: [facility];  Service: General;  Laterality: N/A;  . Application of wound vac N/A 11/03/2013    Procedure: APPLICATION OF WOUND VAC;  Surgeon: Leighton Ruff, MD;  Location: [facility];  Service: General;  Laterality: N/A;  . Cystoscopy with ureteroscopy and stent placement Bilateral 11/03/2013    Procedure: CYSTOSCOPY WITH RIGHT RETROGRADE STENT PLACEMENT , bladder biopsy,TOTAL PROSTATE WITH ENBLOCK CYSTECTOMY AND PROSTATECTOMY/ COLON CONDUIT URINARY DIVERSION/ INSERTION BILATERAL STENTS/ ILEOSTOMY;  Surgeon: Alexis Frock, MD;  Location: [facility];  Service: Urology;  Laterality: Bilateral;  bladder biopsy     FAMILY HISTORY: family history includes COPD in his mother; Healthy in his mother; Lung cancer in his father.   SOCIAL HISTORY:  reports that he has never smoked. He has never used smokeless tobacco. He reports that he does not  drink alcohol or use illicit drugs.   ALLERGIES: Codeine   MEDICATIONS:  Current Outpatient Prescriptions  Medication Sig Dispense Refill  . acetaminophen  (TYLENOL) 500 MG tablet Take 1,000 mg by mouth every 6 (six) hours as needed (pain).       . Multiple Vitamin (MULTIVITAMIN) tablet Take 1 tablet by mouth daily.      Marland Kitchen ALPRAZolam (XANAX) 0.25 MG tablet Take 1 tablet (0.25 mg total) by mouth 2 (two) times daily as needed for anxiety.  60 tablet  0  . ferrous gluconate (FERGON) 324 MG tablet Take 324 mg by mouth daily.      Marland Kitchen oxyCODONE (OXY IR/ROXICODONE) 5 MG immediate release tablet Take 1-2 tablets (5-10 mg total) by mouth every 4 (four) hours as needed for moderate pain, severe pain or breakthrough pain.  30 tablet  0   No current facility-administered medications for this encounter.     REVIEW OF SYSTEMS:  A 15 point review of systems is documented in the electronic medical record. This was obtained by the nursing staff. However, I reviewed this with the patient to discuss relevant findings and make appropriate changes.  Pertinent items are noted in HPI.    PHYSICAL EXAM:  height is 6' (1.829 m) and weight is 202 lb 3.8 oz (91.735 kg). His oral temperature is 98.5 F (36.9 C). His blood pressure is 143/89 and his pulse is 78. His respiration is 20.   ECOG = 1  0 - Asymptomatic (Fully active, able to carry on all predisease activities without restriction)  1 - Symptomatic but completely ambulatory (Restricted in physically strenuous activity but ambulatory and able to carry out work of a light or sedentary nature. For example, light housework, office work)  2 - Symptomatic, <50% in bed during the day (Ambulatory and capable of all self care but unable to carry out any work activities. Up and about more than 50% of waking hours)  3 - Symptomatic, >50% in bed, but not bedbound (Capable of only limited self-care, confined to bed or chair 50% or more of waking hours)  4 - Bedbound (Completely disabled. Cannot carry on any self-care. Totally confined to bed or chair)  5 - Death   Eustace Pen MM, Creech RH, Tormey DC, et al. [phone]). "Toxicity and  response criteria of the [facility]". [place] Oncol. 5 (6): 649-55  General: Well-developed, in no acute distress HEENT: Normocephalic, atraumatic; oral cavity clear Neck: Supple without any lymphadenopathy Cardiovascular: Regular rate and rhythm Respiratory: Clear to auscultation bilaterally GI: Soft, nontender, normal bowel sounds; abdominal wound remains which is packed today. Extremities: No edema present Neuro: No focal deficits Rectal exam deferred    LABORATORY DATA:  Lab Results  Component Value Date   WBC 11.0* 11/08/2013   HGB 8.0* 11/08/2013   HCT 26.8* 11/08/2013   MCV 78.8 11/08/2013   PLT 423* 11/08/2013   Lab Results  Component Value Date   NA 138 11/09/2013   K 4.2 11/09/2013   CL 102 11/09/2013   CO2 28 11/09/2013   Lab Results  Component Value Date   ALT 29 11/09/2013   AST 21 11/09/2013   ALKPHOS 99 11/09/2013   BILITOT 0.2* 11/09/2013      RADIOGRAPHY: Ct Chest W Contrast  11/01/2013   CLINICAL DATA:  Rectal mass on colonoscopy. Patient reports swelling in the left buttocks region  EXAM: CT CHEST, ABDOMEN, AND PELVIS WITH CONTRAST  TECHNIQUE: Multidetector CT imaging of the chest,  abdomen and pelvis was performed following the standard protocol during bolus administration of intravenous contrast.  CONTRAST:  100 cc Omnipaque  COMPARISON:  None  FINDINGS: CT CHEST FINDINGS  No axillary or supraclavicular lymphadenopathy. There are large round abnormal prevascular lymph nodes measuring up to 2.4 cm. Borderline enlarged subcarinal lymph node at 11 mm. Bilateral hilar adenopathy additionally.  Review of the lung parenchyma demonstrates no suspicious pulmonary nodules.  CT ABDOMEN AND PELVIS FINDINGS  No focal hepatic lesion. Gallbladder, pancreas, spleen, adrenal glands, and kidneys are normal. The stomach, small bowel, appendix, and cecum are normal.  There is a large volume stool in the ascending and transverse and descending colon. There is masslike  thickening in the rectum. The tissue planes are poorly defined.  There is breakdown of the rectal wall wall evidence by several gas and is stool collections in the pelvis. One collection is inferior to the bladder elevates the bladder measuring measuring 6.0 x 5.5 cm. Cannot exclude involvement of the inferior wall the bladder by this infectious/neoplastic process.  Another collection is in the left obturator space measuring 5.2 x 7.9 cm. Large collection extends into the left perineum with a 7.7 x 3.8 cm stool and gas collection. The process also extends to the superior base of the bladder with gas and stool.  There are multiple metastatic lymph nodes in the perirectal fat measuring up to 12 mm on both sides (image 106, series 2). Metastatic lymph node in the sigmoid mesial colon measures 15 mm (image 95, series similar lymph nodes on image 102.  No aggressive osseous lesion.  IMPRESSION: 1. Large rectal mass consistent rectal carcinoma. 2. Evidence of breakdown of the rectal wall with several large extraluminal gas and stool collections within the pelvis. 3. One collection elevates the bladder. Cannot exclude involvement interval of bladder. 4. Large abscess extends into the left perineum and the base of the penis. 5. Local nodal metastasis with multiple enlarged abnormal perirectal and mesocolonic nodes. 6. More distal metastasis with metastatic mediastinal and hilar lymph nodes. 7. Evidence of a partial bowel obstruction with significant amount of retained stool in the colon proximal to the sigmoid colon. Findings conveyed toCARL GESSNER on 11/01/2013  at12:34.   Electronically Signed   By: Suzy Bouchard M.D.   On: 11/01/2013 12:35   Ct Abdomen Pelvis W Contrast  11/07/2013   CLINICAL DATA:  Rectal mass on colonoscopy. Patient reports swelling in the left buttocks region  EXAM: CT CHEST, ABDOMEN, AND PELVIS WITH CONTRAST  TECHNIQUE: Multidetector CT imaging of the chest, abdomen and pelvis was performed  following the standard protocol during bolus administration of intravenous contrast.  CONTRAST:  100 cc Omnipaque  COMPARISON:  None  FINDINGS: CT CHEST FINDINGS  No axillary or supraclavicular lymphadenopathy. There are large round abnormal prevascular lymph nodes measuring up to 2.4 cm. Borderline enlarged subcarinal lymph node at 11 mm. Bilateral hilar adenopathy additionally.  Review of the lung parenchyma demonstrates no suspicious pulmonary nodules.  CT ABDOMEN AND PELVIS FINDINGS  No focal hepatic lesion. Gallbladder, pancreas, spleen, adrenal glands, and kidneys are normal. The stomach, small bowel, appendix, and cecum are normal.  There is a large volume stool in the ascending and transverse and descending colon. There is masslike thickening in the rectum. The tissue planes are poorly defined.  There is breakdown of the rectal wall wall evidence by several gas and is stool collections in the pelvis. One collection is inferior to the bladder elevates the bladder measuring measuring  6.0 x 5.5 cm. Cannot exclude involvement of the inferior wall the bladder by this infectious/neoplastic process.  Another collection is in the left obturator space measuring 5.2 x 7.9 cm. Large collection extends into the left perineum with a 7.7 x 3.8 cm stool and gas collection. The process also extends to the superior base of the bladder with gas and stool.  There are multiple metastatic lymph nodes in the perirectal fat measuring up to 12 mm on both sides (image 106, series 2). Metastatic lymph node in the sigmoid mesial colon measures 15 mm (image 95, series similar lymph nodes on image 102.  No aggressive osseous lesion.  IMPRESSION: 1. Large rectal mass consistent rectal carcinoma. 2. Evidence of breakdown of the rectal wall with several large extraluminal gas and stool collections within the pelvis. 3. One collection elevates the bladder. Cannot exclude involvement interval of bladder. 4. Large abscess extends into the  left perineum and the base of the penis. 5. Local nodal metastasis with multiple enlarged abnormal perirectal and mesocolonic nodes. 6. More distal metastasis with metastatic mediastinal and hilar lymph nodes. 7. Evidence of a partial bowel obstruction with significant amount of retained stool in the colon proximal to the sigmoid colon. Findings conveyed to Silvano Rusk on 11/01/2013 at 12:34 .   Electronically Signed   By: Suzy Bouchard M.D.   On: 11/07/2013 09:12   Dg C-arm 61-120 Min-no Report  11/03/2013   CLINICAL DATA: cysto placement   C-ARM 61-120 MINUTES  Fluoroscopy was utilized by the requesting physician.  No radiographic  interpretation.        IMPRESSION: The patient is a pleasant 58 year old male with a recent diagnosis of metastatic rectal cancer. He had a very locally advanced tumor which prompted a pelvic exenteration. Nevertheless, this represented a R2 resection and the patient is at substantial risk for local failure. The patient also of course is at substantial risk of distant progression as well. He is scheduled to see Dr. Benay Spice in medical oncology liter this week.  I believe that postoperative radiation to the pelvis may be reasonable for the patient given his age and performance status in light of his very advanced local disease. However, the patient is still recovering from surgery and does continue to have an open wound. My initial thought is to potentially proceed with chemotherapy alone initially, then followed by chemoradiation after some further recovery. We will see what Dr. Gearldine Shown opinion is on this issue. I therefore did discuss with the patient the potential benefit and logistics of a 5-1/2 week course of palliative radiation treatment. Given his extent of disease and resection margins, I would favor a standard 5-1/2 week course of radiation if we are going to proceed with radiation rather than a shorter course. We also discussed the possible side effects and  risks of treatment as well. All of his questions were answered.   PLAN: No definite followup scheduled at this time. The patient will seek medical oncology later this week and we will coordinate his care after this visit.       ________________________________   Jodelle Gross, MD, PhD

## 2013-11-30 ENCOUNTER — Other Ambulatory Visit (INDEPENDENT_AMBULATORY_CARE_PROVIDER_SITE_OTHER): Payer: Self-pay | Admitting: *Deleted

## 2013-11-30 ENCOUNTER — Encounter: Payer: Self-pay | Admitting: Oncology

## 2013-11-30 ENCOUNTER — Ambulatory Visit (HOSPITAL_BASED_OUTPATIENT_CLINIC_OR_DEPARTMENT_OTHER): Payer: 59 | Admitting: Oncology

## 2013-11-30 ENCOUNTER — Telehealth: Payer: Self-pay | Admitting: Oncology

## 2013-11-30 ENCOUNTER — Ambulatory Visit: Payer: 59

## 2013-11-30 VITALS — BP 153/85 | HR 77 | Temp 98.1°F | Resp 18 | Ht 72.0 in | Wt 202.3 lb

## 2013-11-30 DIAGNOSIS — C779 Secondary and unspecified malignant neoplasm of lymph node, unspecified: Secondary | ICD-10-CM

## 2013-11-30 DIAGNOSIS — Z9889 Other specified postprocedural states: Secondary | ICD-10-CM

## 2013-11-30 DIAGNOSIS — D509 Iron deficiency anemia, unspecified: Secondary | ICD-10-CM

## 2013-11-30 DIAGNOSIS — C20 Malignant neoplasm of rectum: Secondary | ICD-10-CM

## 2013-11-30 MED ORDER — OXYCODONE HCL 5 MG PO TABS: 5.0000 mg | ORAL_TABLET | ORAL | Status: DC | PRN

## 2013-11-30 NOTE — Telephone Encounter (Signed)
gv pt wife appt schedule for april. [facility] will call re pet scan appt. sent staff message to Myer Peer Marcello Moores) @ [facility] re appt for port placement. [facility] will contact pt - pt wife aware.

## 2013-11-30 NOTE — Progress Notes (Signed)
[name] Patient Consult   Referring MD: Trust Leh 58 y.o.  Aug 29, 1956    Reason for Referral: Rectal cancer   HPI: He reports a 3 to four-month history of low abdominal pain and anorexia. He reports treatment for prostatitis did not help. He was also treated for H. pylori. He reports intermittent fever and chills.  He was referred to Dr. Carlean Purl and was taken to a colonoscopy on 10/25/2013. A circumferential mass was found in the rectum. The area was tattooed and in the mass was biopsied. A 2 mm polyp was removed from the transverse colon. Moderate diverticulosis in the left colon. The transverse colon polyp returned as a tubular adenoma. The rectum biopsy revealed invasive adenocarcinoma  He was referred for CTs of the chest, abdomen, and pelvis on 11/01/2013. Abnormal prevascular nodes measured up to 2.4 cm. Borderline enlarged subcarinal node at 11 mm. Bilateral hilar adenopathy. No pulmonary nodules. No focal hepatic lesion. The adrenal glands and kidneys appear normal. Masslike thickening in the rectum. The tissue planes were poorly defined. Breakdown of the rectal wall evidenced by gas and stool collections in the pelvis. One collection inferior to the bladder elevated the bladder. A large collection of stool and gas extended into the left perineum. Multiple metastatic lymph nodes were seen in the perirectal fat. A metastatic lymph node wasn't seen in the sigmoid mesocolon.  He was admitted by Dr. Marcello Moores and was taken to the operating room by doctors Marcello Moores and [place] on 11/04/2013. A cystoscopy with right retrograde stent placement, bladder biopsy, low anterior resection with en bloc cystectomy and prostatectomy/colon conduit urinary diversion and insertion of bilateral stents was performed. An end colostomy was performed. There was evidence of a large bowel obstruction. No sign of metastatic disease to the liver. Bulky lymph nodes were seen in  the rectosigmoid mesentery. There was an obvious perforation Franks stool and 3 let's anteriorly. Frozen section of this tissue returned as adenocarcinoma. Tumor appeared to be invading the bladder. The tumor edge appeared to be 7-8 cm from the anal verge.  The pathology ([id number]) these confirmed an invasive poorly differentiated adenocarcinoma invading through the rectal wall and involving the adjacent prostate and bladder. Tumor was grossly present at the perirectal soft tissue margin at multiple foci. 7 of 16 lymph nodes were positive for metastatic carcinoma. The tumor was grade 3. Lymphovascular invasion was present. A K-ras mutation was detected. Mismatch repair protein expression was normal.  He reports feeling much better following surgery. The abdominal wound is healing.     Past Medical History  Diagnosis Date  . H. pylori infection 08/07/2013  . Anemia-microcytic   D. December 2014   . Gastritis   . Panic attacks   . Adenocarcinoma of rectum 10/25/2013    10/25/2013 colonoscopy  . Personal history of colonic polyps - adenoma 10/25/2013  .    Marland Kitchen      Past Surgical History  Procedure Laterality Date  . Tonsillectomy    . Low anterior bowel resection  11/04/2013  . Cystectomy w/ ureterosigmoidostomy  11/04/2013  . Bowel resection N/A 11/03/2013    Procedure: OPEN ENCOLOSTOMY /COLON RESECTION/COLOSTOMY;  Surgeon: Leighton Ruff, MD;  Location: [facility];  Service: General;  Laterality: N/A;  . Application of wound vac N/A 11/03/2013    Procedure: APPLICATION OF WOUND VAC;  Surgeon: Leighton Ruff, MD;  Location: [facility];  Service: General;  Laterality: N/A;  . Cystoscopy with ureteroscopy and stent  placement Bilateral 11/03/2013    Procedure: CYSTOSCOPY WITH RIGHT RETROGRADE STENT PLACEMENT , bladder biopsy,TOTAL PROSTATE WITH ENBLOCK CYSTECTOMY AND PROSTATECTOMY/ COLON CONDUIT URINARY DIVERSION/ INSERTION BILATERAL STENTS/ ILEOSTOMY;  Surgeon: Alexis Frock, MD;  Location: [facility];   Service: Urology;  Laterality: Bilateral;  bladder biopsy    Medications: Reviewed  Allergies:  Allergies  Allergen Reactions  . Codeine Other (See Comments)    Chest pain    Family history: His father died of lung cancer at age 33 and was a smoker. No other family history of cancer. 2 children are in good health. He has 2 brothers and 3 half-brothers.  Social History:   He lives in [place]. He works in Architect. He does not use tobacco. He is a moderate beer drinker. No transfusion history. No risk factor for HIV or hepatitis.    ROS:   Positives include: 40 pound weight loss, fever and chills for one month prior to surgery, loose stool prior to surgery, pelvic pain, "floaters "in the eyes  A complete ROS was otherwise negative.  Physical Exam:  Blood pressure 153/85, pulse 77, temperature 98.1 F (36.7 C), temperature source Oral, resp. rate 18, height 6' (1.829 m), weight 202 lb 4.8 oz (91.763 kg), SpO2 100.00%.  HEENT: Oropharynx without visible mass, neck without mass Lungs: Clear bilaterally Cardiac: Regular rate and rhythm Abdomen: No hepatosplenomegaly, left lower quadrant colostomy, right abdominal urinary conduit, open midline incision with a packing in place GU: Testes without mass  Vascular: No leg edema Lymph nodes: No cervical, supraclavicular, axillary, or inguinal nodes Neurologic: Alert and oriented, the motor exam appears intact in the upper and lower extremities Skin: No rash Musculoskeletal: No spine tenderness   LAB:  CBC  Lab Results  Component Value Date   WBC 11.0* 11/08/2013   HGB 8.0* 11/08/2013   HCT 26.8* 11/08/2013   MCV 78.8 11/08/2013   PLT 423* 11/08/2013   NEUTROABS 12.3* 11/06/2013     CMP      Component Value Date/Time   NA 138 11/09/2013 0445   NA 137 10/03/2013 0944   K 4.2 11/09/2013 0445   CL 102 11/09/2013 0445   CO2 28 11/09/2013 0445   GLUCOSE 101* 11/09/2013 0445   GLUCOSE 107* 10/03/2013 0944   BUN 14 11/09/2013 0445   BUN  24 10/03/2013 0944   CREATININE 0.67 11/09/2013 0445   CALCIUM 8.2* 11/09/2013 0445   PROT 5.2* 11/09/2013 0445   PROT 6.3 10/03/2013 0944   ALBUMIN 1.6* 11/09/2013 0445   AST 21 11/09/2013 0445   ALT 29 11/09/2013 0445   ALKPHOS 99 11/09/2013 0445   BILITOT 0.2* 11/09/2013 0445   GFRNONAA >90 11/09/2013 0445   GFRAA >90 11/09/2013 0445     Imaging:  As per history of present illness, I reviewed the 11/01/2013 CT images with Austin Robbins and his family   Assessment/Plan:   1. Clinical stage IV (B2W,U1L,K4) adenocarcinoma of the rectum with tumor directly extending to the bladder/prostate and CT scan evidence of metastatic chest adenopathy  Positive K-ras mutation  Normal mismatch repair protein expression  Status post low anterior resection and end colostomy with a cystoprostatectomy and colon conduit urinary diversion 11/01/2013  2. Microcytic anemia-likely iron deficiency  3.   Healing midline surgical wound     Disposition:   Austin Robbins has been diagnosed with advanced stage rectal cancer. He appears to have metastatic disease based on the chest lymphadenopathy.  I discussed the diagnosis and reviewed the CT images  with Austin Robbins and his family. I recommend systemic chemotherapy if a staging PET scan confirms metastatic disease. We will consider pelvic radiation based on his response to systemic therapy.  He will be referred for a staging PET scan and CEA. I will refer him to Dr. Marcello Moores for placement of a Port-A-Cath. The abdominal wound is healing. Austin Robbins will return for an office visit in 2 weeks. I recommended he begin iron replacement.  He lives closer to the [facility]. It may be more convenient for him to be treated there. We will discuss this when he returns in 2 weeks.    [place], [place] 11/30/2013, 5:33 PM

## 2013-11-30 NOTE — Progress Notes (Signed)
Checked in new pt with no financial concerns. °

## 2013-11-30 NOTE — Telephone Encounter (Signed)
Prescription filled out and then signed by urgent office MD, Dr. Redmond Pulling today.  Patient arrived to pick up prescription.  Patient was given prescription by front desk.

## 2013-11-30 NOTE — [facility] Oncology Navigator Note (Signed)
Met with Austin Robbins and family. Explained role of nurse navigator. Educational information provided on colorectal cancer.  Referral made to dietician for diet education. [place] resources provided to patient, including SW service and support group information.  Contact names and phone numbers were provided for entire [facility] team.  Teach back method was used.  Handicap parking pass signed per MD.  Patient and family deny other needs at present time.  Will continue to follow as needed.

## 2013-12-04 ENCOUNTER — Ambulatory Visit (INDEPENDENT_AMBULATORY_CARE_PROVIDER_SITE_OTHER): Payer: 59 | Admitting: General Practice

## 2013-12-04 ENCOUNTER — Encounter: Payer: Self-pay | Admitting: General Practice

## 2013-12-04 VITALS — BP 125/80 | HR 72 | Temp 97.0°F | Ht 72.0 in | Wt 202.0 lb

## 2013-12-04 DIAGNOSIS — M109 Gout, unspecified: Secondary | ICD-10-CM

## 2013-12-04 MED ORDER — COLCHICINE 0.6 MG PO TABS: ORAL_TABLET | ORAL | Status: DC

## 2013-12-04 NOTE — Progress Notes (Signed)
   Subjective:    Patient ID: Austin Robbins, male    DOB: [birth year], 58 y.o.   MRN: [id number]  HPI Patient presents today with complaints right great toe pain. Onset Saturday and gradually worsened. Denies injury. He reports having similar symptoms in the past and resolved. Denies being diagnosed with gout in the past.     Review of Systems  Constitutional: Negative for fever and chills.  Respiratory: Negative for chest tightness and shortness of breath.   Cardiovascular: Negative for chest pain and palpitations.  Musculoskeletal:       Right great toe pain       Objective:   Physical Exam  Constitutional: He is oriented to person, place, and time. He appears well-developed and well-nourished.  Cardiovascular: Normal rate, regular rhythm and normal heart sounds.   Pulmonary/Chest: Effort normal and breath sounds normal. No respiratory distress. He exhibits no tenderness.  Musculoskeletal: He exhibits edema and tenderness.  Right great toe erythema, swelling, tenderness. Capillary refill less than 3 seconds.   Neurological: He is alert and oriented to person, place, and time.  Skin: Skin is warm and dry.  Psychiatric: He has a normal mood and affect.          Assessment & Plan:  1. Gouty arthritis of toe of right foot - colchicine 0.6 MG tablet; Take 1.2 mg (2 tablets) initially, then 0.6mg  (1 tablet) one hour later, wait 12 hours then take 0.6mg  (1 tablet) twice daily.  Dispense: 60 tablet; Refill: 0 - Uric acid -provided and discussed patient information RTO prn Patient verbalized understanding Erby Pian, FNP-C

## 2013-12-04 NOTE — Patient Instructions (Signed)

## 2013-12-05 ENCOUNTER — Telehealth: Payer: Self-pay | Admitting: *Deleted

## 2013-12-05 LAB — URIC ACID: Uric Acid: 5.2 mg/dL (ref 3.7–8.6)

## 2013-12-05 NOTE — Telephone Encounter (Signed)
Per request of Dr. Benay Spice, MSI testing was ordered on Accession: [id number].  Spoke with K. Sharlett Iles in pathology.

## 2013-12-06 ENCOUNTER — Other Ambulatory Visit (INDEPENDENT_AMBULATORY_CARE_PROVIDER_SITE_OTHER): Payer: Self-pay | Admitting: General Surgery

## 2013-12-06 ENCOUNTER — Telehealth (INDEPENDENT_AMBULATORY_CARE_PROVIDER_SITE_OTHER): Payer: Self-pay

## 2013-12-06 NOTE — Telephone Encounter (Signed)
Wife called spoke to Rod Holler , wife is asking if power port placement would be done on new Consult with Dr. Marcello Moores on   12-14-13 Voice mail  Advised this Ov is a new consult ov only.

## 2013-12-07 ENCOUNTER — Other Ambulatory Visit: Payer: Self-pay | Admitting: Nurse Practitioner

## 2013-12-07 ENCOUNTER — Other Ambulatory Visit (HOSPITAL_COMMUNITY)
Admission: RE | Admit: 2013-12-07 | Discharge: 2013-12-07 | Disposition: A | Discharge status: Home / Self Care | Payer: 59 | Source: Ambulatory Visit | Attending: Urology | Admitting: Urology

## 2013-12-07 ENCOUNTER — Other Ambulatory Visit: Payer: 59

## 2013-12-07 DIAGNOSIS — C20 Malignant neoplasm of rectum: Secondary | ICD-10-CM | POA: Insufficient documentation

## 2013-12-11 ENCOUNTER — Ambulatory Visit (HOSPITAL_COMMUNITY)
Admission: RE | Admit: 2013-12-11 | Discharge: 2013-12-11 | Disposition: A | Discharge status: Home / Self Care | Payer: 59 | Source: Ambulatory Visit | Attending: Oncology | Admitting: Oncology

## 2013-12-11 ENCOUNTER — Other Ambulatory Visit (HOSPITAL_BASED_OUTPATIENT_CLINIC_OR_DEPARTMENT_OTHER): Payer: 59

## 2013-12-11 ENCOUNTER — Encounter (HOSPITAL_COMMUNITY): Payer: Self-pay

## 2013-12-11 DIAGNOSIS — R911 Solitary pulmonary nodule: Secondary | ICD-10-CM | POA: Insufficient documentation

## 2013-12-11 DIAGNOSIS — Z933 Colostomy status: Secondary | ICD-10-CM | POA: Insufficient documentation

## 2013-12-11 DIAGNOSIS — C20 Malignant neoplasm of rectum: Secondary | ICD-10-CM | POA: Insufficient documentation

## 2013-12-11 DIAGNOSIS — E278 Other specified disorders of adrenal gland: Secondary | ICD-10-CM | POA: Insufficient documentation

## 2013-12-11 DIAGNOSIS — R599 Enlarged lymph nodes, unspecified: Secondary | ICD-10-CM | POA: Insufficient documentation

## 2013-12-11 DIAGNOSIS — C7970 Secondary malignant neoplasm of unspecified adrenal gland: Secondary | ICD-10-CM | POA: Insufficient documentation

## 2013-12-11 LAB — CBC WITH DIFFERENTIAL/PLATELET
BASO%: 0.4 % (ref 0.0–2.0)
Basophils Absolute: 0 10*3/uL (ref 0.0–0.1)
EOS ABS: 0.3 10*3/uL (ref 0.0–0.5)
EOS%: 3.4 % (ref 0.0–7.0)
HCT: 36.4 % — ABNORMAL LOW (ref 38.4–49.9)
HEMOGLOBIN: 11.4 g/dL — AB (ref 13.0–17.1)
LYMPH#: 2.1 10*3/uL (ref 0.9–3.3)
LYMPH%: 24.1 % (ref 14.0–49.0)
MCH: 23.5 pg — ABNORMAL LOW (ref 27.2–33.4)
MCHC: 31.2 g/dL — ABNORMAL LOW (ref 32.0–36.0)
MCV: 75.4 fL — ABNORMAL LOW (ref 79.3–98.0)
MONO#: 0.6 10*3/uL (ref 0.1–0.9)
MONO%: 6.5 % (ref 0.0–14.0)
NEUT%: 65.6 % (ref 39.0–75.0)
NEUTROS ABS: 5.8 10*3/uL (ref 1.5–6.5)
Platelets: 337 10*3/uL (ref 140–400)
RBC: 4.84 10*6/uL (ref 4.20–5.82)
RDW: 20 % — ABNORMAL HIGH (ref 11.0–14.6)
WBC: 8.9 10*3/uL (ref 4.0–10.3)

## 2013-12-11 LAB — COMPREHENSIVE METABOLIC PANEL (CC13)
ALBUMIN: 3.4 g/dL — AB (ref 3.5–5.0)
ALK PHOS: 80 U/L (ref 40–150)
ALT: 9 U/L (ref 0–55)
ANION GAP: 10 meq/L (ref 3–11)
AST: 12 U/L (ref 5–34)
BUN: 10.1 mg/dL (ref 7.0–26.0)
CO2: 21 meq/L — AB (ref 22–29)
Calcium: 9.4 mg/dL (ref 8.4–10.4)
Chloride: 112 mEq/L — ABNORMAL HIGH (ref 98–109)
Creatinine: 0.8 mg/dL (ref 0.7–1.3)
GLUCOSE: 91 mg/dL (ref 70–140)
POTASSIUM: 4.1 meq/L (ref 3.5–5.1)
SODIUM: 142 meq/L (ref 136–145)
TOTAL PROTEIN: 7.3 g/dL (ref 6.4–8.3)
Total Bilirubin: 0.26 mg/dL (ref 0.20–1.20)

## 2013-12-11 LAB — GLUCOSE, CAPILLARY: Glucose-Capillary: 83 mg/dL (ref 70–99)

## 2013-12-11 MED ORDER — FLUDEOXYGLUCOSE F - 18 (FDG) INJECTION
11.0000 | Freq: Once | INTRAVENOUS | Status: AC | PRN
Start: 2013-12-11 — End: 2013-12-11
  Administered 2013-12-11: 11 via INTRAVENOUS

## 2013-12-11 NOTE — Telephone Encounter (Signed)
Do not see on med list, recent Ca dx

## 2013-12-11 NOTE — Telephone Encounter (Signed)
i have nevert seen this patient and he is on Roxicodone according to his chart- will not fill ultram

## 2013-12-12 ENCOUNTER — Encounter (HOSPITAL_BASED_OUTPATIENT_CLINIC_OR_DEPARTMENT_OTHER): Payer: Self-pay | Admitting: *Deleted

## 2013-12-12 LAB — CEA: CEA: 3.4 ng/mL (ref 0.0–5.0)

## 2013-12-12 NOTE — Progress Notes (Signed)
No labs needed-done cc-12/11/13

## 2013-12-13 ENCOUNTER — Encounter (HOSPITAL_COMMUNITY): Payer: Self-pay

## 2013-12-14 ENCOUNTER — Ambulatory Visit (HOSPITAL_BASED_OUTPATIENT_CLINIC_OR_DEPARTMENT_OTHER): Payer: 59 | Admitting: Oncology

## 2013-12-14 ENCOUNTER — Ambulatory Visit (INDEPENDENT_AMBULATORY_CARE_PROVIDER_SITE_OTHER): Payer: 59 | Admitting: General Surgery

## 2013-12-14 ENCOUNTER — Ambulatory Visit: Payer: 59 | Admitting: Nutrition

## 2013-12-14 ENCOUNTER — Other Ambulatory Visit: Payer: Self-pay | Admitting: *Deleted

## 2013-12-14 ENCOUNTER — Encounter: Payer: Self-pay | Admitting: *Deleted

## 2013-12-14 ENCOUNTER — Encounter (INDEPENDENT_AMBULATORY_CARE_PROVIDER_SITE_OTHER): Payer: Self-pay | Admitting: General Surgery

## 2013-12-14 ENCOUNTER — Telehealth: Payer: Self-pay | Admitting: Oncology

## 2013-12-14 VITALS — BP 130/76 | HR 72 | Temp 98.4°F | Resp 18 | Ht 72.0 in | Wt 202.3 lb

## 2013-12-14 VITALS — BP 120/78 | HR 76 | Temp 98.2°F | Resp 16 | Ht 72.0 in | Wt 202.0 lb

## 2013-12-14 DIAGNOSIS — C20 Malignant neoplasm of rectum: Secondary | ICD-10-CM

## 2013-12-14 DIAGNOSIS — C7970 Secondary malignant neoplasm of unspecified adrenal gland: Secondary | ICD-10-CM

## 2013-12-14 DIAGNOSIS — C7982 Secondary malignant neoplasm of genital organs: Secondary | ICD-10-CM

## 2013-12-14 DIAGNOSIS — C7919 Secondary malignant neoplasm of other urinary organs: Secondary | ICD-10-CM

## 2013-12-14 MED ORDER — ALPRAZOLAM 0.5 MG PO TABS: ORAL_TABLET | ORAL | Status: DC

## 2013-12-14 MED ORDER — PROCHLORPERAZINE MALEATE 10 MG PO TABS: 10.0000 mg | ORAL_TABLET | Freq: Four times a day (QID) | ORAL | Status: DC | PRN

## 2013-12-14 NOTE — Progress Notes (Signed)
Austin Robbins is a 58 y.o. male who is status post a pelvic exenteration on 11/04/13 for rectal cancer invading the prostate and bladder. He reports a good appetite. He denies any fevers.  He is having good bowel movements and has started to gain some weight back. He denies any trouble with his urostomy. He is packing his abdominal midline incision once a day.  He has been having increased scrotal pain over the last few weeks requiring narcotics.  This is a change from the numbness that he was experiencing perioperatively. He underwent a PET CT which showed hypermetabolic activity in the long and mediastinum and the adrenal gland as well as in the pelvic floor and left iliac chain. He is scheduled to start chemotherapy soon. Past Medical History  Diagnosis Date  . H. pylori infection 08/07/2013  . Anemia   . Gastritis   . Panic attacks   . Adenocarcinoma of rectum 10/25/2013    10/25/2013 colonoscopy  . Personal history of colonic polyps - adenoma 10/25/2013  . Colon cancer 11/03/13  . Allergy   . S/P colostomy    Past Surgical History  Procedure Laterality Date  . Tonsillectomy    . Low anterior bowel resection  11/04/2013  . Cystectomy w/ ureterosigmoidostomy  11/04/2013  . Bowel resection N/A 11/03/2013    Procedure: OPEN ENCOLOSTOMY /COLON RESECTION/COLOSTOMY;  Surgeon: Leighton Ruff, MD;  Location: [facility];  Service: General;  Laterality: N/A;  . Application of wound vac N/A 11/03/2013    Procedure: APPLICATION OF WOUND VAC;  Surgeon: Leighton Ruff, MD;  Location: [facility];  Service: General;  Laterality: N/A;  . Cystoscopy with ureteroscopy and stent placement Bilateral 11/03/2013    Procedure: CYSTOSCOPY WITH RIGHT RETROGRADE STENT PLACEMENT , bladder biopsy,TOTAL PROSTATE WITH ENBLOCK CYSTECTOMY AND PROSTATECTOMY/ COLON CONDUIT URINARY DIVERSION/ INSERTION BILATERAL STENTS/ ILEOSTOMY;  Surgeon: Alexis Frock, MD;  Location: [facility];  Service: Urology;  Laterality: Bilateral;  bladder biopsy     Medication List       This list is accurate as of: 12/14/13  2:31 PM.                 acetaminophen 500 MG tablet  Commonly known as:  TYLENOL  Take 1,000 mg by mouth every 6 (six) hours as needed (pain).     ALPRAZolam 0.5 MG tablet  Commonly known as:  XANAX  Take every 8 hours as needed     colchicine 0.6 MG tablet  as needed. Take 1.2 mg (2 tablets) initially, then 0.6mg  (1 tablet) one hour later, wait 12 hours then take 0.6mg  (1 tablet) twice daily.     ferrous gluconate 324 MG tablet  Commonly known as:  FERGON  Take 324 mg by mouth daily.     multivitamin tablet  Take 1 tablet by mouth daily.     oxyCODONE 5 MG immediate release tablet  Commonly known as:  Oxy IR/ROXICODONE  Take 1-2 tablets (5-10 mg total) by mouth every 4 (four) hours as needed for moderate pain, severe pain or breakthrough pain.     prochlorperazine 10 MG tablet  Commonly known as:  COMPAZINE  Take 1 tablet (10 mg total) by mouth every 6 (six) hours as needed for nausea or vomiting.       Allergies  Allergen Reactions  . Codeine Other (See Comments)    Chest pain   Review of Systems - General ROS: negative for - chills or fever Hematological and Lymphatic ROS: negative for -  bleeding problems, bruising or weight loss Respiratory ROS: no cough, shortness of breath, or wheezing Cardiovascular ROS: no chest pain or dyspnea on exertion Gastrointestinal ROS: no abdominal pain, change in bowel habits, or black or bloody stools   Objective:  Filed Vitals:   12/14/13 1402  BP: 120/78  Pulse: 76  Temp: 98.2 F (36.8 C)  Resp: 16    General appearance: alert and cooperative CV: RRR Lungs: CTA GI: normal findings: soft, non-tender  Urostomy and colostomy appeared pink and viable. Both seem to be functioning well.  Incision: healing well: Silver nitrate applied  Assessment:  s/p  Patient Active Problem List    Diagnosis  Date Noted   .  Panic attacks    .  Gastritis    .   Intra-abdominal abscess  11/01/2013   .  Pyuria  11/01/2013   .  Anemia of chronic disease  11/01/2013   .  Rectal adenocarcinoma with perforation & invasion into bladder s/p LAR/cystectomy/colon conduit 11/04/2013  11/01/2013   .  Fever  10/26/2013   .  Personal history of colonic polyps - adenoma  10/25/2013   Surgical Pathology  Colon, segmental resection for tumor, and bladder and prostate  - INVASIVE POORLY DIFFERENTIATED ADENOCARCINOMA, INVADING THROUGH THE RECTAL  WALL AND INVOLVING THE ADJACENT PROSTATE TISSUE AND BLADDER WALL AND GROSSLY  PRESENT AT THE PERIRECTAL SOFT TISSUE MARGIN AT MULTIPLE FOCI.  - SEVEN OF SIXTEEN LYMPH NODES, POSITIVE FOR METASTATIC CARCINOMA (7/16).  pT4b R2, pN2b, pMX  Lab Results  Component Value Date   CEA 3.4 12/11/2013    Plan:  He seems to be doing well after surgery.  He is maintaining his weight.   He is scheduled to see Dr. Tresa Moore next week.  He is scheduled with me to have a port placed on Monday. We discussed the risk and benefits of this procedure. Risks include bleeding, infection and pneumothorax. The patient appears to understand these risks and is agreed to proceed with surgery.   Rosario Adie, [facility], [place]

## 2013-12-14 NOTE — Telephone Encounter (Signed)
gv and pirnted aptps ched and avs for pt for April adn May....Marland Kitchensed added tx.

## 2013-12-14 NOTE — Progress Notes (Signed)
Patient is a 58 year old male diagnosed with rectal cancer.  He is a patient of Dr. Benay Spice.  Past medical history includes H. pylori infection, anemia, gastritis, panic attacks, and colon polyps.  Medications include Xanax, Fergon, and multivitamin.  Labs include albumin 3.4 on April 6.  Height: 6 feet 0 inch. Weight: 202 pounds March 30. Usual body weight: 250 pounds about 9 months ago. BMI: 27.39.  Status post low anterior resection and end colostomy with a cystoprostatectomy and colon conduit urinary diversion 11/01/2013.  I met with patient, his wife, and his mother.  Patient is to begin FOLFOX.  He admits to about a 50 pound weight loss over the past 9 months which was unintentional.  Patient reports much of his weight loss occurred around the time of surgery.  Patient now has a good appetite.  He has a healing midline surgical wound.  He is drinking a protein shake every day.  He denies nutrition side effects.  Nutrition diagnosis: Food and nutrition related knowledge deficit related to new diagnosis of rectal cancer and associated treatments as evidenced by no prior need for nutrition related information.  Intervention: Patient and family educated on the importance of smaller, more frequent meals and snacks, providing adequate calories and protein to promote weight maintenance.  Reviewed high-protein foods with patient.  Briefly educated him on foods that might cause increased gas through colostomy.  Encouraged adequate fluid intake.  Provided fact sheets and contact information.  Teach back method used.  Monitoring, evaluation, goals: Patient will tolerate adequate calories and protein to promote weight maintenance with minimal side effects.  Next visit: Monday, April 20, during chemotherapy.

## 2013-12-14 NOTE — Progress Notes (Signed)
[facility] OFFICE PROGRESS NOTE   Diagnosis: Rectal cancer  INTERVAL HISTORY:   Austin Robbins returns as scheduled. The abdominal wound is healing. He had a "gout "flare in the left foot and ankle last week. He developed pain and edema extending to the left lower leg. This has improved. He was treated with colchicine.   Objective:  Vital signs in last 24 hours:  Blood pressure 130/76, pulse 72, temperature 98.4 F (36.9 C), temperature source Oral, resp. rate 18, height 6' (1.829 m), weight 202 lb 4.8 oz (91.763 kg).    HEENT: Neck without mass Resp: Lungs clear bilaterally Cardio: Regular rate and rhythm GI: No hepatomegaly, right lower quadrant urostomy, left lower quadrant colostomy, healing midline incision with a 4-5 cm area at the midportion of the incision that is open superficially. No evidence of infection. Vascular: Trace edema at the left ankle, no leg edema, no palpable cord   Lab Results:  Lab Results  Component Value Date   WBC 8.9 12/11/2013   HGB 11.4* 12/11/2013   HCT 36.4* 12/11/2013   MCV 75.4* 12/11/2013   PLT 337 12/11/2013   NEUTROABS 5.8 12/11/2013    Lab Results  Component Value Date   NA 142 12/11/2013    Lab Results  Component Value Date   CEA 3.4 12/11/2013    Imaging: PET scan 12/11/2013-hypermetabolic activity in the surgical bed with hypermetabolic left common and left internal iliac nodes. Hypermetabolic left adrenal metastasis. Hypermetabolic left upper lobe nodule, hypermetabolic mediastinal adenopathy. I reviewed the PET images with Austin Robbins   Medications: I have reviewed the patient's current medications.  Assessment/Plan: 1. Clinical stage IV (F7J,O8T,G5) adenocarcinoma of the rectum with tumor directly extending to the bladder/prostate and CT scan evidence of metastatic chest adenopathy Positive K-ras mutation  Normal mismatch repair protein expression  Status post low anterior resection and end colostomy with a  cystoprostatectomy and colon conduit urinary diversion 11/01/2013 Staging PET scan with hypermetabolic pelvic nodes, mediastinal nodes, left adrenal metastasis, and left upper lobe nodule 2. Microcytic anemia-likely iron deficiency, improved       3. Healing midline surgical wound        4.   gout  Disposition:  Austin Robbins has metastatic rectal cancer. I discussed treatment options with Austin Robbins and his family. His case was presented at the GI tumor conference 12/06/2013. The consensus opinion is to proceed with systemic therapy. It is very likely the chest adenopathy and lung nodule are related to metastatic rectal cancer, though it is possible he has a primary lung cancer. He reports no smoking history. I do not recommend a biopsy.  We discussed systemic treatment options. We specifically discussed  FOLFOX and CAPOX. He is most comfortable with the FOLFOX regimen. I reviewed the potential toxicities associated with FOLFOX including the chance for nausea/vomiting, mucositis, alopecia, diarrhea, an allergic reaction, and hematologic toxicity. We discussed the rash, hyperpigmentation, and hand/foot syndrome associated with 5-fluorouracil. We reviewed the various types of neuropathy seen with oxaliplatin. He agrees to proceed.  Austin Robbins is scheduled for placement of a Port-A-Cath 12/18/2013. He will be scheduled for a chemotherapy teaching class and first cycle of FOLFOX 12/25/2013. He will contact us if the abdominal wound has not completely healed prior 12/25/2013.  Austin Robbins will return for an office visit and cycle 2 chemotherapy 01/08/2014.  We refilled a prescription for Xanax today. The left foot/ankle pain and swelling were likely related to a gout flare. He will contact us  for recurrent swelling or pain. I have a low clinical suspicion for a DVT today. He will meet with the cancer Center nutritionist today. We discussed the [employer] rectal cancer tissue bank study and he met with a  research nurse today.  Approximately 40 minutes were spent with the patient today. The majority of the time was used for counseling and coordination of care.  Ladell Pier, MD  12/14/2013  10:05 AM  And an

## 2013-12-18 ENCOUNTER — Ambulatory Visit (HOSPITAL_COMMUNITY): Payer: 59

## 2013-12-18 ENCOUNTER — Ambulatory Visit (HOSPITAL_BASED_OUTPATIENT_CLINIC_OR_DEPARTMENT_OTHER): Payer: 59 | Admitting: Anesthesiology

## 2013-12-18 ENCOUNTER — Encounter (HOSPITAL_BASED_OUTPATIENT_CLINIC_OR_DEPARTMENT_OTHER)
Admission: RE | Disposition: A | Discharge status: Home / Self Care | Payer: Self-pay | Source: Ambulatory Visit | Attending: General Surgery

## 2013-12-18 ENCOUNTER — Encounter (HOSPITAL_BASED_OUTPATIENT_CLINIC_OR_DEPARTMENT_OTHER): Payer: 59 | Admitting: Anesthesiology

## 2013-12-18 ENCOUNTER — Ambulatory Visit (HOSPITAL_BASED_OUTPATIENT_CLINIC_OR_DEPARTMENT_OTHER)
Admission: RE | Admit: 2013-12-18 | Discharge: 2013-12-18 | Disposition: A | Discharge status: Home / Self Care | Payer: 59 | Source: Ambulatory Visit | Attending: General Surgery | Admitting: General Surgery

## 2013-12-18 ENCOUNTER — Encounter (HOSPITAL_BASED_OUTPATIENT_CLINIC_OR_DEPARTMENT_OTHER): Payer: Self-pay | Admitting: *Deleted

## 2013-12-18 DIAGNOSIS — Z933 Colostomy status: Secondary | ICD-10-CM | POA: Insufficient documentation

## 2013-12-18 DIAGNOSIS — Z936 Other artificial openings of urinary tract status: Secondary | ICD-10-CM | POA: Insufficient documentation

## 2013-12-18 DIAGNOSIS — F410 Panic disorder [episodic paroxysmal anxiety] without agoraphobia: Secondary | ICD-10-CM | POA: Insufficient documentation

## 2013-12-18 DIAGNOSIS — C20 Malignant neoplasm of rectum: Secondary | ICD-10-CM

## 2013-12-18 DIAGNOSIS — D649 Anemia, unspecified: Secondary | ICD-10-CM | POA: Insufficient documentation

## 2013-12-18 HISTORY — PX: PORTACATH PLACEMENT: SHX2246

## 2013-12-18 HISTORY — DX: Colostomy status: Z93.3

## 2013-12-18 LAB — POCT HEMOGLOBIN-HEMACUE: Hemoglobin: 11.5 g/dL — ABNORMAL LOW (ref 13.0–17.0)

## 2013-12-18 SURGERY — INSERTION, TUNNELED CENTRAL VENOUS DEVICE, WITH PORT
Anesthesia: General | Site: Chest | Laterality: Left

## 2013-12-18 MED ORDER — PROMETHAZINE HCL 25 MG/ML IJ SOLN: 6.2500 mg | INTRAMUSCULAR | Status: DC | PRN

## 2013-12-18 MED ORDER — CEFAZOLIN SODIUM-DEXTROSE 2-3 GM-% IV SOLR
INTRAVENOUS | Status: AC
  Filled 2013-12-18: qty 50

## 2013-12-18 MED ORDER — MORPHINE SULFATE 2 MG/ML IJ SOLN: 1.0000 mg | INTRAMUSCULAR | Status: DC | PRN

## 2013-12-18 MED ORDER — HEPARIN SOD (PORK) LOCK FLUSH 100 UNIT/ML IV SOLN
INTRAVENOUS | Status: DC | PRN
  Administered 2013-12-18: 500 [IU] via INTRAVENOUS

## 2013-12-18 MED ORDER — MIDAZOLAM HCL 5 MG/5ML IJ SOLN
INTRAMUSCULAR | Status: DC | PRN
  Administered 2013-12-18: 2 mg via INTRAVENOUS

## 2013-12-18 MED ORDER — CEFAZOLIN SODIUM-DEXTROSE 2-3 GM-% IV SOLR
2.0000 g | INTRAVENOUS | Status: AC
  Administered 2013-12-18: 2 g via INTRAVENOUS

## 2013-12-18 MED ORDER — HEPARIN (PORCINE) IN NACL 2-0.9 UNIT/ML-% IJ SOLN
INTRAMUSCULAR | Status: AC
  Filled 2013-12-18: qty 500

## 2013-12-18 MED ORDER — HEPARIN (PORCINE) LOCK FLUSH 2 UNIT/ML IV SOLN
INTRAVENOUS | Status: DC | PRN
  Administered 2013-12-18: 5 mL

## 2013-12-18 MED ORDER — FENTANYL CITRATE 0.05 MG/ML IJ SOLN
INTRAMUSCULAR | Status: DC | PRN
  Administered 2013-12-18: 100 ug via INTRAVENOUS

## 2013-12-18 MED ORDER — LIDOCAINE HCL (CARDIAC) 20 MG/ML IV SOLN
INTRAVENOUS | Status: DC | PRN
  Administered 2013-12-18: 60 mg via INTRAVENOUS

## 2013-12-18 MED ORDER — MIDAZOLAM HCL 2 MG/2ML IJ SOLN
INTRAMUSCULAR | Status: AC
  Filled 2013-12-18: qty 2

## 2013-12-18 MED ORDER — FENTANYL CITRATE 0.05 MG/ML IJ SOLN
INTRAMUSCULAR | Status: AC
  Filled 2013-12-18: qty 4

## 2013-12-18 MED ORDER — BUPIVACAINE-EPINEPHRINE 0.5% -1:200000 IJ SOLN
INTRAMUSCULAR | Status: DC | PRN
  Administered 2013-12-18: 10 mL

## 2013-12-18 MED ORDER — MIDAZOLAM HCL 2 MG/2ML IJ SOLN: 1.0000 mg | INTRAMUSCULAR | Status: DC | PRN

## 2013-12-18 MED ORDER — PROPOFOL 10 MG/ML IV BOLUS
INTRAVENOUS | Status: DC | PRN
  Administered 2013-12-18: 200 mg via INTRAVENOUS

## 2013-12-18 MED ORDER — FENTANYL CITRATE 0.05 MG/ML IJ SOLN: 50.0000 ug | INTRAMUSCULAR | Status: DC | PRN

## 2013-12-18 MED ORDER — DEXAMETHASONE SODIUM PHOSPHATE 4 MG/ML IJ SOLN
INTRAMUSCULAR | Status: DC | PRN
  Administered 2013-12-18: 10 mg via INTRAVENOUS

## 2013-12-18 MED ORDER — HEPARIN SOD (PORK) LOCK FLUSH 100 UNIT/ML IV SOLN
INTRAVENOUS | Status: AC
  Filled 2013-12-18: qty 5

## 2013-12-18 MED ORDER — ONDANSETRON HCL 4 MG/2ML IJ SOLN
INTRAMUSCULAR | Status: DC | PRN
  Administered 2013-12-18: 4 mg via INTRAVENOUS

## 2013-12-18 MED ORDER — LACTATED RINGERS IV SOLN
INTRAVENOUS | Status: DC
  Administered 2013-12-18: 09:00:00 via INTRAVENOUS

## 2013-12-18 SURGICAL SUPPLY — 43 items
BAG DECANTER FOR FLEXI CONT (MISCELLANEOUS) ×3 IMPLANT
BLADE HEX COATED 2.75 (ELECTRODE) ×3 IMPLANT
BLADE SURG 15 STRL LF DISP TIS (BLADE) ×1 IMPLANT
BLADE SURG 15 STRL SS (BLADE) ×2
CANISTER SUCT 1200ML W/VALVE (MISCELLANEOUS) IMPLANT
CHLORAPREP W/TINT 26ML (MISCELLANEOUS) ×3 IMPLANT
COVER MAYO STAND STRL (DRAPES) ×3 IMPLANT
COVER PROBE 5X48 (MISCELLANEOUS)
COVER TABLE BACK 60X90 (DRAPES) ×3 IMPLANT
DECANTER SPIKE VIAL GLASS SM (MISCELLANEOUS) IMPLANT
DERMABOND ADVANCED (GAUZE/BANDAGES/DRESSINGS) ×2
DERMABOND ADVANCED .7 DNX12 (GAUZE/BANDAGES/DRESSINGS) ×1 IMPLANT
DRAPE C-ARM 42X72 X-RAY (DRAPES) ×3 IMPLANT
DRAPE LAPAROSCOPIC ABDOMINAL (DRAPES) ×3 IMPLANT
DRAPE UTILITY XL STRL (DRAPES) ×3 IMPLANT
ELECT REM PT RETURN 9FT ADLT (ELECTROSURGICAL) ×3
ELECTRODE REM PT RTRN 9FT ADLT (ELECTROSURGICAL) ×1 IMPLANT
GAUZE SPONGE 4X4 16PLY XRAY LF (GAUZE/BANDAGES/DRESSINGS) IMPLANT
GLOVE BIO SURGEON STRL SZ 6.5 (GLOVE) ×2 IMPLANT
GLOVE BIO SURGEONS STRL SZ 6.5 (GLOVE) ×1
GLOVE BIOGEL PI IND STRL 7.0 (GLOVE) ×1 IMPLANT
GLOVE BIOGEL PI INDICATOR 7.0 (GLOVE) ×2
GOWN STRL REUS W/ TWL LRG LVL3 (GOWN DISPOSABLE) ×1 IMPLANT
GOWN STRL REUS W/TWL 2XL LVL3 (GOWN DISPOSABLE) ×3 IMPLANT
GOWN STRL REUS W/TWL LRG LVL3 (GOWN DISPOSABLE) ×2
KIT CVR 48X5XPRB PLUP LF (MISCELLANEOUS) IMPLANT
KIT PORT POWER 8FR ISP CVUE (Catheter) ×6 IMPLANT
KIT PORT POWER 9.6FR MRI PREA (Catheter) IMPLANT
KIT PORT POWER ISP 8FR (Catheter) IMPLANT
KIT POWER CATH 8FR (Catheter) IMPLANT
NEEDLE HYPO 25X1 1.5 SAFETY (NEEDLE) ×3 IMPLANT
NS IRRIG 1000ML POUR BTL (IV SOLUTION) ×3 IMPLANT
PACK BASIN DAY SURGERY FS (CUSTOM PROCEDURE TRAY) ×3 IMPLANT
PENCIL BUTTON HOLSTER BLD 10FT (ELECTRODE) ×3 IMPLANT
SLEEVE SCD COMPRESS KNEE MED (MISCELLANEOUS) ×3 IMPLANT
SUT PROLENE 2 0 SH DA (SUTURE) ×3 IMPLANT
SUT VIC AB 3-0 SH 27 (SUTURE) ×2
SUT VIC AB 3-0 SH 27X BRD (SUTURE) ×1 IMPLANT
SUT VICRYL 4-0 PS2 18IN ABS (SUTURE) ×3 IMPLANT
SYR 20CC LL (SYRINGE) ×6 IMPLANT
SYR 5ML LUER SLIP (SYRINGE) ×3 IMPLANT
SYR CONTROL 10ML LL (SYRINGE) ×3 IMPLANT
TOWEL OR 17X24 6PK STRL BLUE (TOWEL DISPOSABLE) ×3 IMPLANT

## 2013-12-18 NOTE — H&P (View-Only) (Signed)
Austin Robbins is a 58 y.o. male who is status post a pelvic exenteration on 11/04/13 for rectal cancer invading the prostate and bladder. He reports a good appetite. He denies any fevers.  He is having good bowel movements and has started to gain some weight back. He denies any trouble with his urostomy. He is packing his abdominal midline incision once a day.  He has been having increased scrotal pain over the last few weeks requiring narcotics.  This is a change from the numbness that he was experiencing perioperatively. He underwent a PET CT which showed hypermetabolic activity in the long and mediastinum and the adrenal gland as well as in the pelvic floor and left iliac chain. He is scheduled to start chemotherapy soon. Past Medical History  Diagnosis Date  . H. pylori infection 08/07/2013  . Anemia   . Gastritis   . Panic attacks   . Adenocarcinoma of rectum 10/25/2013    10/25/2013 colonoscopy  . Personal history of colonic polyps - adenoma 10/25/2013  . Colon cancer 11/03/13  . Allergy   . S/P colostomy    Past Surgical History  Procedure Laterality Date  . Tonsillectomy    . Low anterior bowel resection  11/04/2013  . Cystectomy w/ ureterosigmoidostomy  11/04/2013  . Bowel resection N/A 11/03/2013    Procedure: OPEN ENCOLOSTOMY /COLON RESECTION/COLOSTOMY;  Surgeon: Leighton Ruff, MD;  Location: [facility];  Service: General;  Laterality: N/A;  . Application of wound vac N/A 11/03/2013    Procedure: APPLICATION OF WOUND VAC;  Surgeon: Leighton Ruff, MD;  Location: [facility];  Service: General;  Laterality: N/A;  . Cystoscopy with ureteroscopy and stent placement Bilateral 11/03/2013    Procedure: CYSTOSCOPY WITH RIGHT RETROGRADE STENT PLACEMENT , bladder biopsy,TOTAL PROSTATE WITH ENBLOCK CYSTECTOMY AND PROSTATECTOMY/ COLON CONDUIT URINARY DIVERSION/ INSERTION BILATERAL STENTS/ ILEOSTOMY;  Surgeon: Alexis Frock, MD;  Location: [facility];  Service: Urology;  Laterality: Bilateral;  bladder biopsy     Medication List       This list is accurate as of: 12/14/13  2:31 PM.                 acetaminophen 500 MG tablet  Commonly known as:  TYLENOL  Take 1,000 mg by mouth every 6 (six) hours as needed (pain).     ALPRAZolam 0.5 MG tablet  Commonly known as:  XANAX  Take every 8 hours as needed     colchicine 0.6 MG tablet  as needed. Take 1.2 mg (2 tablets) initially, then 0.6mg  (1 tablet) one hour later, wait 12 hours then take 0.6mg  (1 tablet) twice daily.     ferrous gluconate 324 MG tablet  Commonly known as:  FERGON  Take 324 mg by mouth daily.     multivitamin tablet  Take 1 tablet by mouth daily.     oxyCODONE 5 MG immediate release tablet  Commonly known as:  Oxy IR/ROXICODONE  Take 1-2 tablets (5-10 mg total) by mouth every 4 (four) hours as needed for moderate pain, severe pain or breakthrough pain.     prochlorperazine 10 MG tablet  Commonly known as:  COMPAZINE  Take 1 tablet (10 mg total) by mouth every 6 (six) hours as needed for nausea or vomiting.       Allergies  Allergen Reactions  . Codeine Other (See Comments)    Chest pain   Review of Systems - General ROS: negative for - chills or fever Hematological and Lymphatic ROS: negative for -  bleeding problems, bruising or weight loss Respiratory ROS: no cough, shortness of breath, or wheezing Cardiovascular ROS: no chest pain or dyspnea on exertion Gastrointestinal ROS: no abdominal pain, change in bowel habits, or black or bloody stools   Objective:  Filed Vitals:   12/14/13 1402  BP: 120/78  Pulse: 76  Temp: 98.2 F (36.8 C)  Resp: 16    General appearance: alert and cooperative CV: RRR Lungs: CTA GI: normal findings: soft, non-tender  Urostomy and colostomy appeared pink and viable. Both seem to be functioning well.  Incision: healing well: Silver nitrate applied  Assessment:  s/p  Patient Active Problem List    Diagnosis  Date Noted   .  Panic attacks    .  Gastritis    .   Intra-abdominal abscess  11/01/2013   .  Pyuria  11/01/2013   .  Anemia of chronic disease  11/01/2013   .  Rectal adenocarcinoma with perforation & invasion into bladder s/p LAR/cystectomy/colon conduit 11/04/2013  11/01/2013   .  Fever  10/26/2013   .  Personal history of colonic polyps - adenoma  10/25/2013   Surgical Pathology  Colon, segmental resection for tumor, and bladder and prostate  - INVASIVE POORLY DIFFERENTIATED ADENOCARCINOMA, INVADING THROUGH THE RECTAL  WALL AND INVOLVING THE ADJACENT PROSTATE TISSUE AND BLADDER WALL AND GROSSLY  PRESENT AT THE PERIRECTAL SOFT TISSUE MARGIN AT MULTIPLE FOCI.  - SEVEN OF SIXTEEN LYMPH NODES, POSITIVE FOR METASTATIC CARCINOMA (7/16).  pT4b R2, pN2b, pMX  Lab Results  Component Value Date   CEA 3.4 12/11/2013    Plan:  He seems to be doing well after surgery.  He is maintaining his weight.   He is scheduled to see Dr. Tresa Moore next week.  He is scheduled with me to have a port placed on Monday. We discussed the risk and benefits of this procedure. Risks include bleeding, infection and pneumothorax. The patient appears to understand these risks and is agreed to proceed with surgery.   Rosario Adie, [facility], [place]

## 2013-12-18 NOTE — Anesthesia Postprocedure Evaluation (Signed)
  Anesthesia Post-op Note  Patient: Austin Robbins  Procedure(s) Performed: Procedure(s): INSERTION PORT-A-CATH (Left)  Patient Location: PACU  Anesthesia Type:General  Level of Consciousness: awake and alert   Airway and Oxygen Therapy: Patient Spontanous Breathing  Post-op Pain: mild  Post-op Assessment: Post-op Vital signs reviewed, Patient's Cardiovascular Status Stable and Respiratory Function Stable  Post-op Vital Signs: Reviewed  Filed Vitals:   12/18/13 1145  BP: 141/94  Pulse: 60  Temp:   Resp: 13    Complications: No apparent anesthesia complications

## 2013-12-18 NOTE — Op Note (Signed)
12/18/2013  11:05 AM  PATIENT:  Austin Robbins  58 y.o. male  Patient Care Team: Chipper Herb, MD as PCP - General (Family Medicine) Alexis Frock, MD as Consulting Physician (Urology) Leighton Ruff, MD as Consulting Physician (General Surgery) Gatha Mayer, MD as Consulting Physician (Gastroenterology) Carola Frost, RN as Registered Nurse  PRE-OPERATIVE DIAGNOSIS:  STAGE 4 RECTAL CANCER   POST-OPERATIVE DIAGNOSIS:  STAGE 4 RECTAL CANCER   PROCEDURE:  Procedure(s): INSERTION PORT-A-CATH  SURGEON:  Surgeon(s): Leighton Ruff, MD  ANESTHESIA:   MAC  EBL: min    COUNTS:  YES  PLAN OF CARE: Discharge to home after PACU  PATIENT DISPOSITION:  PACU - hemodynamically stable.  INDICATION: This is a 58 y.o. M with stage 4 rectal cancer who needs adjuvant chemotherapy after surgical resection.   OR FINDINGS: normal appearing anatomy   Is an 62 [place] clear view power port. It goes through the left subclavian vein  Procedure: Informed consent was confirmed. Patient was brought the operating room. and positioned supine. Arms were tucked. The patient underwent deep sedation. I shoulder roll was placed under the patient.  Neck and chest were clipped and prepped and draped in a sterile fashion. A surgical timeout confirmed our plan.  I placed a field block of local anesthesia on the neck & chest  I entered the [place] vein in standard fashion under the clavicle on the first attempt. Wire was passed into the inferior vena cava by fluoroscopy.  I made an incision in the lateral infraclavicular pocket. Made a subcutaneous pocket. I tunneled the power port from the chest wound to the neck puncture site. The port was secured to the left anterior chest wall using 2-0 Prolene interrupted stitches x2. Catheter flushed well.  I used a dilator on the wire using Seldinger technique to dilate the tract. Then I used a dilator with a peel away sheath.  We used fluoroscopy.  I pulled the wire back  into the right atrial/superior vena cava junction.  I removed the wire and dilator. The catheter was placed within the sheath. The sheath was peeled away. I cut the catheter to appropriate length.  The port was placed into the pocket and secured into place.    Fluoroscopy confirmed the tip in the distal SVC ~2cm below the carina.    Catheter aspirated and flushed well. On final fluoro reevaluation the tip seen to be in good position in the distal SVC.    I closed the wounds using interrupted 2-0 vicryl sutures to close the pocket over the port and a 4-0 Vicryl stitch to close the skin.  I placed sterile dressings. Patient should go home later today. Catheter is okay to use.

## 2013-12-18 NOTE — Anesthesia Procedure Notes (Signed)
Procedure Name: LMA Insertion Date/Time: 12/18/2013 10:12 AM Performed by: Lyndee Leo Pre-anesthesia Checklist: Patient identified, Emergency Drugs available, Suction available and Patient being monitored Patient Re-evaluated:Patient Re-evaluated prior to inductionOxygen Delivery Method: Circle System Utilized Preoxygenation: Pre-oxygenation with 100% oxygen Intubation Type: IV induction Ventilation: Mask ventilation without difficulty LMA: LMA inserted LMA Size: 5.0 Number of attempts: 1 Airway Equipment and Method: bite block Placement Confirmation: positive ETCO2 Tube secured with: Tape Dental Injury: Teeth and Oropharynx as per pre-operative assessment

## 2013-12-18 NOTE — Transfer of Care (Signed)
Immediate Anesthesia Transfer of Care Note  Patient: Austin Robbins  Procedure(s) Performed: Procedure(s): INSERTION PORT-A-CATH (Left)  Patient Location: PACU  Anesthesia Type:General  Level of Consciousness: awake, sedated and patient cooperative  Airway & Oxygen Therapy: Patient Spontanous Breathing and Patient connected to face mask oxygen  Post-op Assessment: Report given to PACU RN and Post -op Vital signs reviewed and stable  Post vital signs: Reviewed and stable  Complications: No apparent anesthesia complications

## 2013-12-18 NOTE — Discharge Instructions (Addendum)
GENERAL SURGERY: POST OP INSTRUCTIONS ° °1. DIET: Follow a light bland diet the first 24 hours after arrival home, such as soup, liquids, crackers, etc.  Be sure to include lots of fluids daily.  Avoid fast food or heavy meals as your are more likely to get nauseated.   °2. Take your usually prescribed home medications unless otherwise directed. °3. PAIN CONTROL: °a. Pain is best controlled by a usual combination of three different methods TOGETHER: °i. Ice/Heat °ii. Over the counter pain medication °iii. Prescription pain medication °b. Most patients will experience some swelling and bruising around the incisions.  Ice packs or heating pads (30-60 minutes up to 6 times a day) will help. Use ice for the first few days to help decrease swelling and bruising, then switch to heat to help relax tight/sore spots and speed recovery.  Some people prefer to use ice alone, heat alone, alternating between ice & heat.  Experiment to what works for you.  Swelling and bruising can take several weeks to resolve.   °c. It is helpful to take an over-the-counter pain medication regularly for the first few weeks.  Choose one of the following that works best for you: °i. Naproxen (Aleve, etc)  Two 220mg tabs twice a day °ii. Ibuprofen (Advil, etc) Three 200mg tabs four times a day (every meal & bedtime) °d. A  prescription for pain medication (such as Percocet, oxycodone, hydrocodone, etc) should be given to you upon discharge.  Take your pain medication as prescribed.  °i. If you are having problems/concerns with the prescription medicine (does not control pain, nausea, vomiting, rash, itching, etc), please call us [phone] to see if we need to switch you to a different pain medicine that will work better for you and/or control your side effect better. °ii. If you need a refill on your pain medication, please contact your pharmacy.  They will contact our office to request authorization. Prescriptions will not be filled after 5  pm or on week-ends. °4. Avoid getting constipated.  Between the surgery and the pain medications, it is common to experience some constipation.  Increasing fluid intake and taking a fiber supplement (such as Metamucil, Citrucel, FiberCon, MiraLax, etc) 1-2 times a day regularly will usually help prevent this problem from occurring.  A mild laxative (prune juice, Milk of Magnesia, MiraLax, etc) should be taken according to package directions if there are no bowel movements after 48 hours.   °5. Wash / shower every day.  You may shower over the dressings as they are waterproof.  Continue to shower over incision(s) after the dressing is off. °6. Remove your waterproof bandages 5 days after surgery.  You may leave the incision open to air.  You may have skin tapes (Steri Strips) covering the incision(s).  Leave them on until one week, then remove.  You may replace a dressing/Band-Aid to cover the incision for comfort if you wish.  ° ° ° ° °7. ACTIVITIES as tolerated:   °a. You may resume regular (light) daily activities beginning the next day--such as daily self-care, walking, climbing stairs--gradually increasing activities as tolerated.  If you can walk 30 minutes without difficulty, it is safe to try more intense activity such as jogging, treadmill, bicycling, low-impact aerobics, swimming, etc. °b. Save the most intensive and strenuous activity for last such as sit-ups, heavy lifting, contact sports, etc  Refrain from any heavy lifting or straining until you are off narcotics for pain control.   °c. DO NOT PUSH   THROUGH PAIN.  Let pain be your guide: If it hurts to do something, don't do it.  Pain is your body warning you to avoid that activity for another week until the pain goes down. °d. You may drive when you are no longer taking prescription pain medication, you can comfortably wear a seatbelt, and you can safely maneuver your car and apply brakes. °e. You may have sexual intercourse when it is comfortable.   °8. FOLLOW UP in our office °a. Please call [facility] at [phone] to set up an appointment to see your surgeon in the office for a follow-up appointment approximately 2-3 weeks after your surgery. °b. Make sure that you call for this appointment the day you arrive home to insure a convenient appointment time. °9. IF YOU HAVE DISABILITY OR FAMILY LEAVE FORMS, BRING THEM TO THE OFFICE FOR PROCESSING.  DO NOT GIVE THEM TO YOUR DOCTOR. ° ° °WHEN TO CALL US [phone]: °1. Poor pain control °2. Reactions / problems with new medications (rash/itching, nausea, etc)  °3. Fever over 101.5 F (38.5 C) °4. Worsening swelling or bruising °5. Continued bleeding from incision. °6. Increased pain, redness, or drainage from the incision ° ° The clinic staff is available to answer your questions during regular business hours (8:30am-5pm).  Please don’t hesitate to call and ask to speak to one of our nurses for clinical concerns.  ° If you have a medical emergency, go to the nearest emergency room or call 911. ° A surgeon from [facility] is always on call at the hospitals ° ° °[facility] °[address], [address], [place], [place]  [address] ? °MAIN: [phone] ? TOLL FREE: [phone] ?  °FAX [phone] °[email or web address] ° ° °Post Anesthesia Home Care Instructions ° °Activity: °Get plenty of rest for the remainder of the day. A responsible adult should stay with you for 24 hours following the procedure.  °For the next 24 hours, DO NOT: °-Drive a car °-Operate machinery °-Drink alcoholic beverages °-Take any medication unless instructed by your physician °-Make any legal decisions or sign important papers. ° °Meals: °Start with liquid foods such as gelatin or soup. Progress to regular foods as tolerated. Avoid greasy, spicy, heavy foods. If nausea and/or vomiting occur, drink only clear liquids until the nausea and/or vomiting subsides. Call your physician if vomiting  continues. ° °Special Instructions/Symptoms: °Your throat may feel dry or sore from the anesthesia or the breathing tube placed in your throat during surgery. If this causes discomfort, gargle with warm salt water. The discomfort should disappear within 24 hours. ° °

## 2013-12-18 NOTE — Interval H&P Note (Signed)
History and Physical Interval Note:  12/18/2013 9:31 AM  Austin Robbins  has presented today for surgery, with the diagnosis of STAGE 4 RECTAL CANCER   The various methods of treatment have been discussed with the patient and family. After consideration of risks, benefits and other options for treatment, the patient has consented to  Procedure(s): INSERTION PORT-A-CATH (N/A) as a surgical intervention .  The patient's history has been reviewed, patient examined, no change in status, stable for surgery.  I have reviewed the patient's chart and labs.  Questions were answered to the patient's satisfaction.     Rosario Adie, MD  [facility]

## 2013-12-18 NOTE — Anesthesia Preprocedure Evaluation (Signed)
Anesthesia Evaluation  Patient identified by MRN, date of birth, ID band Patient awake    Reviewed: Allergy & Precautions, H&P , NPO status , Patient's Chart, lab work & pertinent test results  Airway Mallampati: II TM Distance: >3 FB Neck ROM: full    Dental no notable dental hx. (+) Teeth Intact, Dental Advisory Given   Pulmonary neg pulmonary ROS,  breath sounds clear to auscultation  Pulmonary exam normal       Cardiovascular Exercise Tolerance: Good negative cardio ROS  Rhythm:regular Rate:Normal     Neuro/Psych Panic attacksnegative neurological ROS  negative psych ROS   GI/Hepatic negative GI ROS, Neg liver ROS,   Endo/Other  negative endocrine ROS  Renal/GU      Musculoskeletal   Abdominal   Peds  Hematology negative hematology ROS (+) anemia , hgb 6.5   Anesthesia Other Findings   Reproductive/Obstetrics negative OB ROS                           Anesthesia Physical Anesthesia Plan  ASA: II  Anesthesia Plan: General   Post-op Pain Management:    Induction: Intravenous  Airway Management Planned: LMA  Additional Equipment:   Intra-op Plan:   Post-operative Plan: Extubation in OR  Informed Consent: I have reviewed the patients History and Physical, chart, labs and discussed the procedure including the risks, benefits and alternatives for the proposed anesthesia with the patient or authorized representative who has indicated his/her understanding and acceptance.   Dental advisory given  Plan Discussed with: CRNA and Surgeon  Anesthesia Plan Comments:         Anesthesia Quick Evaluation

## 2013-12-24 ENCOUNTER — Other Ambulatory Visit: Payer: Self-pay | Admitting: Oncology

## 2013-12-25 ENCOUNTER — Encounter (HOSPITAL_BASED_OUTPATIENT_CLINIC_OR_DEPARTMENT_OTHER): Payer: Self-pay | Admitting: General Surgery

## 2013-12-25 ENCOUNTER — Ambulatory Visit: Payer: 59 | Admitting: Nutrition

## 2013-12-25 ENCOUNTER — Other Ambulatory Visit: Payer: 59

## 2013-12-25 ENCOUNTER — Other Ambulatory Visit: Payer: Self-pay | Admitting: *Deleted

## 2013-12-25 ENCOUNTER — Ambulatory Visit (HOSPITAL_BASED_OUTPATIENT_CLINIC_OR_DEPARTMENT_OTHER): Payer: 59

## 2013-12-25 VITALS — BP 115/63 | HR 63 | Temp 98.8°F

## 2013-12-25 DIAGNOSIS — C7919 Secondary malignant neoplasm of other urinary organs: Secondary | ICD-10-CM

## 2013-12-25 DIAGNOSIS — C7982 Secondary malignant neoplasm of genital organs: Secondary | ICD-10-CM

## 2013-12-25 DIAGNOSIS — C20 Malignant neoplasm of rectum: Secondary | ICD-10-CM

## 2013-12-25 DIAGNOSIS — C7970 Secondary malignant neoplasm of unspecified adrenal gland: Secondary | ICD-10-CM

## 2013-12-25 DIAGNOSIS — Z5111 Encounter for antineoplastic chemotherapy: Secondary | ICD-10-CM

## 2013-12-25 MED ORDER — ONDANSETRON 16 MG/50ML IVPB (CHCC)
INTRAVENOUS | Status: AC
  Filled 2013-12-25: qty 16

## 2013-12-25 MED ORDER — ONDANSETRON 8 MG/NS 50 ML IVPB
INTRAVENOUS | Status: AC
  Filled 2013-12-25: qty 8

## 2013-12-25 MED ORDER — LEUCOVORIN CALCIUM INJECTION 350 MG
393.5000 mg/m2 | Freq: Once | INTRAVENOUS | Status: AC
  Administered 2013-12-25: 850 mg via INTRAVENOUS
  Filled 2013-12-25: qty 42.5

## 2013-12-25 MED ORDER — FLUOROURACIL CHEMO INJECTION 2.5 GM/50ML
400.0000 mg/m2 | Freq: Once | INTRAVENOUS | Status: AC
  Administered 2013-12-25: 850 mg via INTRAVENOUS
  Filled 2013-12-25: qty 17

## 2013-12-25 MED ORDER — ONDANSETRON 8 MG/50ML IVPB (CHCC)
8.0000 mg | Freq: Once | INTRAVENOUS | Status: AC
  Administered 2013-12-25: 8 mg via INTRAVENOUS

## 2013-12-25 MED ORDER — SODIUM CHLORIDE 0.9 % IV SOLN
2400.0000 mg/m2 | INTRAVENOUS | Status: DC
  Administered 2013-12-25: 5200 mg via INTRAVENOUS
  Filled 2013-12-25: qty 104

## 2013-12-25 MED ORDER — LIDOCAINE-PRILOCAINE 2.5-2.5 % EX CREA: 1.0000 "application " | TOPICAL_CREAM | CUTANEOUS | Status: DC | PRN

## 2013-12-25 MED ORDER — DEXAMETHASONE SODIUM PHOSPHATE 10 MG/ML IJ SOLN
10.0000 mg | Freq: Once | INTRAMUSCULAR | Status: AC
  Administered 2013-12-25: 10 mg via INTRAVENOUS

## 2013-12-25 MED ORDER — DEXAMETHASONE SODIUM PHOSPHATE 10 MG/ML IJ SOLN
INTRAMUSCULAR | Status: AC
  Filled 2013-12-25: qty 1

## 2013-12-25 MED ORDER — LIDOCAINE-PRILOCAINE 2.5-2.5 % EX CREA
TOPICAL_CREAM | CUTANEOUS | Status: AC
  Filled 2013-12-25: qty 5

## 2013-12-25 MED ORDER — DEXAMETHASONE SODIUM PHOSPHATE 20 MG/5ML IJ SOLN
INTRAMUSCULAR | Status: AC
  Filled 2013-12-25: qty 5

## 2013-12-25 MED ORDER — DEXTROSE 5 % IV SOLN
85.0000 mg/m2 | Freq: Once | INTRAVENOUS | Status: AC
  Administered 2013-12-25: 185 mg via INTRAVENOUS
  Filled 2013-12-25: qty 37

## 2013-12-25 MED ORDER — DEXTROSE 5 % IV SOLN
Freq: Once | INTRAVENOUS | Status: AC
  Administered 2013-12-25: 12:00:00 via INTRAVENOUS

## 2013-12-25 NOTE — Patient Instructions (Signed)
[place] Discharge Instructions for Patients Receiving Chemotherapy  Today you received the following chemotherapy agents 5 FU/Leucovorin/Oxailplatin \\To  help prevent nausea and vomiting after your treatment, we encourage you to take your nausea medication   If you develop nausea and vomiting that is not controlled by your nausea medication, call the clinic.   BELOW ARE SYMPTOMS THAT SHOULD BE REPORTED IMMEDIATELY:  *FEVER GREATER THAN 100.5 F  *CHILLS WITH OR WITHOUT FEVER  NAUSEA AND VOMITING THAT IS NOT CONTROLLED WITH YOUR NAUSEA MEDICATION  *UNUSUAL SHORTNESS OF BREATH  *UNUSUAL BRUISING OR BLEEDING  TENDERNESS IN MOUTH AND THROAT WITH OR WITHOUT PRESENCE OF ULCERS  *URINARY PROBLEMS  *BOWEL PROBLEMS  UNUSUAL RASH Items with * indicate a potential emergency and should be followed up as soon as possible.  Feel free to call the clinic you have any questions or concerns. The clinic phone number is [phone].    Fluorouracil, 5-FU injection What is this medicine? FLUOROURACIL, 5-FU (flure oh YOOR a sil) is a chemotherapy drug. It slows the growth of cancer cells. This medicine is used to treat many types of cancer like breast cancer, colon or rectal cancer, pancreatic cancer, and stomach cancer. This medicine may be used for other purposes; ask your health care provider or pharmacist if you have questions. COMMON BRAND NAME(S): Adrucil What should I tell my health care provider before I take this medicine? They need to know if you have any of these conditions: -blood disorders -dihydropyrimidine dehydrogenase (DPD) deficiency -infection (especially a virus infection such as chickenpox, cold sores, or herpes) -kidney disease -liver disease -malnourished, poor nutrition -recent or ongoing radiation therapy -an unusual or allergic reaction to fluorouracil, other chemotherapy, other medicines, foods, dyes, or preservatives -pregnant or trying to get  pregnant -breast-feeding How should I use this medicine? This drug is given as an infusion or injection into a vein. It is administered in a hospital or clinic by a specially trained health care professional. Talk to your pediatrician regarding the use of this medicine in children. Special care may be needed. Overdosage: If you think you have taken too much of this medicine contact a poison control center or emergency room at once. NOTE: This medicine is only for you. Do not share this medicine with others. What if I miss a dose? It is important not to miss your dose. Call your doctor or health care professional if you are unable to keep an appointment. What may interact with this medicine? -allopurinol -cimetidine -dapsone -digoxin -hydroxyurea -leucovorin -levamisole -medicines for seizures like ethotoin, fosphenytoin, phenytoin -medicines to increase blood counts like filgrastim, pegfilgrastim, sargramostim -medicines that treat or prevent blood clots like warfarin, enoxaparin, and dalteparin -methotrexate -metronidazole -pyrimethamine -some other chemotherapy drugs like busulfan, cisplatin, estramustine, vinblastine -trimethoprim -trimetrexate -vaccines Talk to your doctor or health care professional before taking any of these medicines: -acetaminophen -aspirin -ibuprofen -ketoprofen -naproxen This list may not describe all possible interactions. Give your health care provider a list of all the medicines, herbs, non-prescription drugs, or dietary supplements you use. Also tell them if you smoke, drink alcohol, or use illegal drugs. Some items may interact with your medicine. What should I watch for while using this medicine? Visit your doctor for checks on your progress. This drug may make you feel generally unwell. This is not uncommon, as chemotherapy can affect healthy cells as well as cancer cells. Report any side effects. Continue your course of treatment even though you  feel ill unless your  doctor tells you to stop. In some cases, you may be given additional medicines to help with side effects. Follow all directions for their use. Call your doctor or health care professional for advice if you get a fever, chills or sore throat, or other symptoms of a cold or flu. Do not treat yourself. This drug decreases your body's ability to fight infections. Try to avoid being around people who are sick. This medicine may increase your risk to bruise or bleed. Call your doctor or health care professional if you notice any unusual bleeding. Be careful brushing and flossing your teeth or using a toothpick because you may get an infection or bleed more easily. If you have any dental work done, tell your dentist you are receiving this medicine. Avoid taking products that contain aspirin, acetaminophen, ibuprofen, naproxen, or ketoprofen unless instructed by your doctor. These medicines may hide a fever. Do not become pregnant while taking this medicine. Women should inform their doctor if they wish to become pregnant or think they might be pregnant. There is a potential for serious side effects to an unborn child. Talk to your health care professional or pharmacist for more information. Do not breast-feed an infant while taking this medicine. Men should inform their doctor if they wish to father a child. This medicine may lower sperm counts. Do not treat diarrhea with over the counter products. Contact your doctor if you have diarrhea that lasts more than 2 days or if it is severe and watery. This medicine can make you more sensitive to the sun. Keep out of the sun. If you cannot avoid being in the sun, wear protective clothing and use sunscreen. Do not use sun lamps or tanning beds/booths. What side effects may I notice from receiving this medicine? Side effects that you should report to your doctor or health care professional as soon as possible: -allergic reactions like skin rash,  itching or hives, swelling of the face, lips, or tongue -low blood counts - this medicine may decrease the number of white blood cells, red blood cells and platelets. You may be at increased risk for infections and bleeding. -signs of infection - fever or chills, cough, sore throat, pain or difficulty passing urine -signs of decreased platelets or bleeding - bruising, pinpoint red spots on the skin, black, tarry stools, blood in the urine -signs of decreased red blood cells - unusually weak or tired, fainting spells, lightheadedness -breathing problems -changes in vision -chest pain -mouth sores -nausea and vomiting -pain, swelling, redness at site where injected -pain, tingling, numbness in the hands or feet -redness, swelling, or sores on hands or feet -stomach pain -unusual bleeding Side effects that usually do not require medical attention (report to your doctor or health care professional if they continue or are bothersome): -changes in finger or toe nails -diarrhea -dry or itchy skin -hair loss -headache -loss of appetite -sensitivity of eyes to the light -stomach upset -unusually teary eyes This list may not describe all possible side effects. Call your doctor for medical advice about side effects. You may report side effects to FDA at 1-800-FDA-1088. Where should I keep my medicine? This drug is given in a hospital or clinic and will not be stored at home. NOTE: This sheet is a summary. It may not cover all possible information. If you have questions about this medicine, talk to your doctor, pharmacist, or health care provider.  2014, Elsevier/Gold Standard. (2007-12-28 13:53:16)   Oxaliplatin Injection What is this medicine? OXALIPLATIN (  ox AL i PLA tin) is a chemotherapy drug. It targets fast dividing cells, like cancer cells, and causes these cells to die. This medicine is used to treat cancers of the colon and rectum, and many other cancers. This medicine may be used  for other purposes; ask your health care provider or pharmacist if you have questions. COMMON BRAND NAME(S): Eloxatin What should I tell my health care provider before I take this medicine? They need to know if you have any of these conditions: -kidney disease -an unusual or allergic reaction to oxaliplatin, other chemotherapy, other medicines, foods, dyes, or preservatives -pregnant or trying to get pregnant -breast-feeding How should I use this medicine? This drug is given as an infusion into a vein. It is administered in a hospital or clinic by a specially trained health care professional. Talk to your pediatrician regarding the use of this medicine in children. Special care may be needed. Overdosage: If you think you have taken too much of this medicine contact a poison control center or emergency room at once. NOTE: This medicine is only for you. Do not share this medicine with others. What if I miss a dose? It is important not to miss a dose. Call your doctor or health care professional if you are unable to keep an appointment. What may interact with this medicine? -medicines to increase blood counts like filgrastim, pegfilgrastim, sargramostim -probenecid -some antibiotics like amikacin, gentamicin, neomycin, polymyxin B, streptomycin, tobramycin -zalcitabine Talk to your doctor or health care professional before taking any of these medicines: -acetaminophen -aspirin -ibuprofen -ketoprofen -naproxen This list may not describe all possible interactions. Give your health care provider a list of all the medicines, herbs, non-prescription drugs, or dietary supplements you use. Also tell them if you smoke, drink alcohol, or use illegal drugs. Some items may interact with your medicine. What should I watch for while using this medicine? Your condition will be monitored carefully while you are receiving this medicine. You will need important blood work done while you are taking this  medicine. This medicine can make you more sensitive to cold. Do not drink cold drinks or use ice. Cover exposed skin before coming in contact with cold temperatures or cold objects. When out in cold weather wear warm clothing and cover your mouth and nose to warm the air that goes into your lungs. Tell your doctor if you get sensitive to the cold. This drug may make you feel generally unwell. This is not uncommon, as chemotherapy can affect healthy cells as well as cancer cells. Report any side effects. Continue your course of treatment even though you feel ill unless your doctor tells you to stop. In some cases, you may be given additional medicines to help with side effects. Follow all directions for their use. Call your doctor or health care professional for advice if you get a fever, chills or sore throat, or other symptoms of a cold or flu. Do not treat yourself. This drug decreases your body's ability to fight infections. Try to avoid being around people who are sick. This medicine may increase your risk to bruise or bleed. Call your doctor or health care professional if you notice any unusual bleeding. Be careful brushing and flossing your teeth or using a toothpick because you may get an infection or bleed more easily. If you have any dental work done, tell your dentist you are receiving this medicine. Avoid taking products that contain aspirin, acetaminophen, ibuprofen, naproxen, or ketoprofen unless instructed by your  doctor. These medicines may hide a fever. Do not become pregnant while taking this medicine. Women should inform their doctor if they wish to become pregnant or think they might be pregnant. There is a potential for serious side effects to an unborn child. Talk to your health care professional or pharmacist for more information. Do not breast-feed an infant while taking this medicine. Call your doctor or health care professional if you get diarrhea. Do not treat yourself. What side  effects may I notice from receiving this medicine? Side effects that you should report to your doctor or health care professional as soon as possible: -allergic reactions like skin rash, itching or hives, swelling of the face, lips, or tongue -low blood counts - This drug may decrease the number of white blood cells, red blood cells and platelets. You may be at increased risk for infections and bleeding. -signs of infection - fever or chills, cough, sore throat, pain or difficulty passing urine -signs of decreased platelets or bleeding - bruising, pinpoint red spots on the skin, black, tarry stools, nosebleeds -signs of decreased red blood cells - unusually weak or tired, fainting spells, lightheadedness -breathing problems -chest pain, pressure -cough -diarrhea -jaw tightness -mouth sores -nausea and vomiting -pain, swelling, redness or irritation at the injection site -pain, tingling, numbness in the hands or feet -problems with balance, talking, walking -redness, blistering, peeling or loosening of the skin, including inside the mouth -trouble passing urine or change in the amount of urine Side effects that usually do not require medical attention (report to your doctor or health care professional if they continue or are bothersome): -changes in vision -constipation -hair loss -loss of appetite -metallic taste in the mouth or changes in taste -stomach pain This list may not describe all possible side effects. Call your doctor for medical advice about side effects. You may report side effects to FDA at 1-800-FDA-1088. Where should I keep my medicine? This drug is given in a hospital or clinic and will not be stored at home. NOTE: This sheet is a summary. It may not cover all possible information. If you have questions about this medicine, talk to your doctor, pharmacist, or health care provider.  2014, Elsevier/Gold Standard. (2008-03-20 17:22:47)   Leucovorin injection What is  this medicine? LEUCOVORIN (loo koe VOR in) is used to prevent or treat the harmful effects of some medicines. This medicine is used to treat anemia caused by a low amount of folic acid in the body. It is also used with 5-fluorouracil (5-FU) to treat colon cancer. This medicine may be used for other purposes; ask your health care provider or pharmacist if you have questions. What should I tell my health care provider before I take this medicine? They need to know if you have any of these conditions: -anemia from low levels of vitamin B-12 in the blood -an unusual or allergic reaction to leucovorin, folic acid, other medicines, foods, dyes, or preservatives -pregnant or trying to get pregnant -breast-feeding How should I use this medicine? This medicine is for injection into a muscle or into a vein. It is given by a health care professional in a hospital or clinic setting. Talk to your pediatrician regarding the use of this medicine in children. Special care may be needed. Overdosage: If you think you have taken too much of this medicine contact a poison control center or emergency room at once. NOTE: This medicine is only for you. Do not share this medicine with others. What if  I miss a dose? This does not apply. What may interact with this medicine? -capecitabine -fluorouracil -phenobarbital -phenytoin -primidone -trimethoprim-sulfamethoxazole This list may not describe all possible interactions. Give your health care provider a list of all the medicines, herbs, non-prescription drugs, or dietary supplements you use. Also tell them if you smoke, drink alcohol, or use illegal drugs. Some items may interact with your medicine. What should I watch for while using this medicine? Your condition will be monitored carefully while you are receiving this medicine. This medicine may increase the side effects of 5-fluorouracil, 5-FU. Tell your doctor or health care professional if you have diarrhea or  mouth sores that do not get better or that get worse. What side effects may I notice from receiving this medicine? Side effects that you should report to your doctor or health care professional as soon as possible: -allergic reactions like skin rash, itching or hives, swelling of the face, lips, or tongue -breathing problems -fever, infection -mouth sores -unusual bleeding or bruising -unusually weak or tired Side effects that usually do not require medical attention (report to your doctor or health care professional if they continue or are bothersome): -constipation or diarrhea -loss of appetite -nausea, vomiting This list may not describe all possible side effects. Call your doctor for medical advice about side effects. You may report side effects to FDA at 1-800-FDA-1088. Where should I keep my medicine? This drug is given in a hospital or clinic and will not be stored at home. NOTE: This sheet is a summary. It may not cover all possible information. If you have questions about this medicine, talk to your doctor, pharmacist, or health care provider.  2014, Elsevier/Gold Standard. (2008-02-28 16:50:29)

## 2013-12-25 NOTE — Progress Notes (Signed)
Followup completed with patient.  Patient has no changes in appetite or nutrition side effects at this time.  Patient's weight was documented as 203 pounds April 13.  He continues to heal and eat well.  No nutrition concerns at this time.  Nutrition diagnosis: Food and nutrition related knowledge deficit continues but has improved.  Intervention: I will continue to follow patient for chemotherapy related side effects.  Enforced the importance of small, frequent meals and snacks with adequate protein for weight maintenance.  Monitoring, evaluation, goals: Patient will tolerate adequate calories and protein with minimal side effects throughout treatment.  Next visit: Monday, May fourth, during chemotherapy.

## 2013-12-27 ENCOUNTER — Ambulatory Visit (HOSPITAL_BASED_OUTPATIENT_CLINIC_OR_DEPARTMENT_OTHER): Payer: 59

## 2013-12-27 ENCOUNTER — Encounter: Payer: Self-pay | Admitting: Oncology

## 2013-12-27 ENCOUNTER — Telehealth: Payer: Self-pay | Admitting: *Deleted

## 2013-12-27 VITALS — BP 110/62 | HR 75

## 2013-12-27 DIAGNOSIS — C7919 Secondary malignant neoplasm of other urinary organs: Secondary | ICD-10-CM

## 2013-12-27 DIAGNOSIS — C20 Malignant neoplasm of rectum: Secondary | ICD-10-CM

## 2013-12-27 DIAGNOSIS — C7970 Secondary malignant neoplasm of unspecified adrenal gland: Secondary | ICD-10-CM

## 2013-12-27 DIAGNOSIS — C7982 Secondary malignant neoplasm of genital organs: Secondary | ICD-10-CM

## 2013-12-27 MED ORDER — SODIUM CHLORIDE 0.9 % IJ SOLN
10.0000 mL | INTRAMUSCULAR | Status: DC | PRN
  Administered 2013-12-27: 10 mL
  Filled 2013-12-27: qty 10

## 2013-12-27 MED ORDER — HEPARIN SOD (PORK) LOCK FLUSH 100 UNIT/ML IV SOLN
500.0000 [IU] | Freq: Once | INTRAVENOUS | Status: AC | PRN
  Administered 2013-12-27: 500 [IU]
  Filled 2013-12-27: qty 5

## 2013-12-27 NOTE — Progress Notes (Signed)
Called [employer] to see if Austin Robbins is needed for pt's chemo.  Per [name] no auth is req but a referral is needed from pt's PCP.  I called pt and made him and his wife aware that a referral is needed from his PCP.  His wife is going to take care of it.

## 2013-12-27 NOTE — Telephone Encounter (Signed)
Spoke with pt on cell phone for post chemo follow up call.  Pt stated sleeping well during night.  Good appetite and drinking lots of fluids as tolerated.  Denied nausea/vomiting.   Has surgical pain , no new pain per pt.  Bowel and bladder function fine.  Aware of pump disconnect appt today at 1pm. Pt stated he had good experience at first chemo treatment.  Staff were courteous, nice, and provided pt with explanations throughout treatment.  No other concerns at this time.

## 2013-12-28 ENCOUNTER — Encounter (INDEPENDENT_AMBULATORY_CARE_PROVIDER_SITE_OTHER): Payer: 59 | Admitting: General Surgery

## 2014-01-07 ENCOUNTER — Other Ambulatory Visit: Payer: Self-pay | Admitting: Oncology

## 2014-01-08 ENCOUNTER — Other Ambulatory Visit: Payer: Self-pay | Admitting: *Deleted

## 2014-01-08 ENCOUNTER — Ambulatory Visit: Payer: 59 | Admitting: Nutrition

## 2014-01-08 ENCOUNTER — Ambulatory Visit (HOSPITAL_BASED_OUTPATIENT_CLINIC_OR_DEPARTMENT_OTHER): Payer: 59 | Admitting: Nurse Practitioner

## 2014-01-08 ENCOUNTER — Other Ambulatory Visit (HOSPITAL_BASED_OUTPATIENT_CLINIC_OR_DEPARTMENT_OTHER): Payer: 59

## 2014-01-08 ENCOUNTER — Ambulatory Visit (INDEPENDENT_AMBULATORY_CARE_PROVIDER_SITE_OTHER): Payer: 59 | Admitting: General Surgery

## 2014-01-08 ENCOUNTER — Ambulatory Visit (HOSPITAL_BASED_OUTPATIENT_CLINIC_OR_DEPARTMENT_OTHER): Payer: 59

## 2014-01-08 ENCOUNTER — Encounter (INDEPENDENT_AMBULATORY_CARE_PROVIDER_SITE_OTHER): Payer: Self-pay | Admitting: General Surgery

## 2014-01-08 VITALS — BP 136/74 | HR 63 | Temp 97.9°F | Resp 18 | Ht 72.0 in | Wt 212.3 lb

## 2014-01-08 VITALS — BP 132/76 | HR 64 | Temp 98.0°F | Resp 18 | Ht 72.0 in | Wt 213.0 lb

## 2014-01-08 DIAGNOSIS — C7919 Secondary malignant neoplasm of other urinary organs: Secondary | ICD-10-CM

## 2014-01-08 DIAGNOSIS — C20 Malignant neoplasm of rectum: Secondary | ICD-10-CM

## 2014-01-08 DIAGNOSIS — C7982 Secondary malignant neoplasm of genital organs: Secondary | ICD-10-CM

## 2014-01-08 DIAGNOSIS — C7970 Secondary malignant neoplasm of unspecified adrenal gland: Secondary | ICD-10-CM

## 2014-01-08 DIAGNOSIS — C189 Malignant neoplasm of colon, unspecified: Secondary | ICD-10-CM

## 2014-01-08 DIAGNOSIS — Z5111 Encounter for antineoplastic chemotherapy: Secondary | ICD-10-CM

## 2014-01-08 LAB — CBC WITH DIFFERENTIAL/PLATELET
BASO%: 0.5 % (ref 0.0–2.0)
BASOS ABS: 0 10*3/uL (ref 0.0–0.1)
EOS ABS: 0.4 10*3/uL (ref 0.0–0.5)
EOS%: 5.9 % (ref 0.0–7.0)
HEMATOCRIT: 36.3 % — AB (ref 38.4–49.9)
HGB: 11.4 g/dL — ABNORMAL LOW (ref 13.0–17.1)
LYMPH%: 24.4 % (ref 14.0–49.0)
MCH: 23.6 pg — ABNORMAL LOW (ref 27.2–33.4)
MCHC: 31.5 g/dL — ABNORMAL LOW (ref 32.0–36.0)
MCV: 75.1 fL — ABNORMAL LOW (ref 79.3–98.0)
MONO#: 0.6 10*3/uL (ref 0.1–0.9)
MONO%: 9.4 % (ref 0.0–14.0)
NEUT%: 59.8 % (ref 39.0–75.0)
NEUTROS ABS: 3.7 10*3/uL (ref 1.5–6.5)
PLATELETS: 173 10*3/uL (ref 140–400)
RBC: 4.83 10*6/uL (ref 4.20–5.82)
RDW: 18.5 % — ABNORMAL HIGH (ref 11.0–14.6)
WBC: 6.2 10*3/uL (ref 4.0–10.3)
lymph#: 1.5 10*3/uL (ref 0.9–3.3)

## 2014-01-08 LAB — COMPREHENSIVE METABOLIC PANEL (CC13)
ALBUMIN: 3.3 g/dL — AB (ref 3.5–5.0)
ALT: 7 U/L (ref 0–55)
AST: 10 U/L (ref 5–34)
Alkaline Phosphatase: 78 U/L (ref 40–150)
Anion Gap: 9 mEq/L (ref 3–11)
BILIRUBIN TOTAL: 0.29 mg/dL (ref 0.20–1.20)
BUN: 13.4 mg/dL (ref 7.0–26.0)
CO2: 23 meq/L (ref 22–29)
Calcium: 9.1 mg/dL (ref 8.4–10.4)
Chloride: 111 mEq/L — ABNORMAL HIGH (ref 98–109)
Creatinine: 0.8 mg/dL (ref 0.7–1.3)
GLUCOSE: 109 mg/dL (ref 70–140)
Potassium: 3.9 mEq/L (ref 3.5–5.1)
SODIUM: 144 meq/L (ref 136–145)
TOTAL PROTEIN: 6.7 g/dL (ref 6.4–8.3)

## 2014-01-08 MED ORDER — SODIUM CHLORIDE 0.9 % IV SOLN
2400.0000 mg/m2 | INTRAVENOUS | Status: DC
  Administered 2014-01-08: 5200 mg via INTRAVENOUS
  Filled 2014-01-08: qty 104

## 2014-01-08 MED ORDER — ONDANSETRON 8 MG/NS 50 ML IVPB
INTRAVENOUS | Status: AC
  Filled 2014-01-08: qty 8

## 2014-01-08 MED ORDER — DEXAMETHASONE SODIUM PHOSPHATE 10 MG/ML IJ SOLN
INTRAMUSCULAR | Status: AC
  Filled 2014-01-08: qty 1

## 2014-01-08 MED ORDER — OXALIPLATIN CHEMO INJECTION 100 MG/20ML
85.0000 mg/m2 | Freq: Once | INTRAVENOUS | Status: AC
  Administered 2014-01-08: 185 mg via INTRAVENOUS
  Filled 2014-01-08: qty 37

## 2014-01-08 MED ORDER — DEXAMETHASONE SODIUM PHOSPHATE 10 MG/ML IJ SOLN
10.0000 mg | Freq: Once | INTRAMUSCULAR | Status: AC
  Administered 2014-01-08: 10 mg via INTRAVENOUS

## 2014-01-08 MED ORDER — ALPRAZOLAM 1 MG PO TABS: ORAL_TABLET | ORAL | Status: DC

## 2014-01-08 MED ORDER — DEXTROSE 5 % IV SOLN
Freq: Once | INTRAVENOUS | Status: AC
  Administered 2014-01-08: 12:00:00 via INTRAVENOUS

## 2014-01-08 MED ORDER — ONDANSETRON 8 MG/50ML IVPB (CHCC)
8.0000 mg | Freq: Once | INTRAVENOUS | Status: AC
  Administered 2014-01-08: 8 mg via INTRAVENOUS

## 2014-01-08 MED ORDER — LEUCOVORIN CALCIUM INJECTION 350 MG
401.0000 mg/m2 | Freq: Once | INTRAVENOUS | Status: AC
  Administered 2014-01-08: 850 mg via INTRAVENOUS
  Filled 2014-01-08: qty 42.5

## 2014-01-08 MED ORDER — FLUOROURACIL CHEMO INJECTION 2.5 GM/50ML
400.0000 mg/m2 | Freq: Once | INTRAVENOUS | Status: AC
  Administered 2014-01-08: 850 mg via INTRAVENOUS
  Filled 2014-01-08: qty 17

## 2014-01-08 NOTE — Patient Instructions (Signed)
Return to the office in 3 months 

## 2014-01-08 NOTE — Progress Notes (Signed)
Nutrition followup completed with patient.  Patient is receiving chemotherapy for rectal cancer.  He reports good appetite and increased energy.  Weight is increased to 212.3 pounds from 203 pounds April 13.  Patient has no nutrition concerns.  Nutrition diagnosis: Food and nutrition related knowledge deficit improved.  Intervention: I will continue to follow patient as needed for chemotherapy related side effects.  Enforced the importance of small, frequent meals and snacks with adequate protein for weight maintenance.  Monitoring, evaluation, goals: Patient is tolerating a regular diet and has gained 9 pounds.  Next visit: No followup is scheduled.  Patient has my contact information for questions or concerns.

## 2014-01-08 NOTE — Progress Notes (Signed)
  [place] OFFICE PROGRESS NOTE   Diagnosis:  Metastatic rectal cancer.  INTERVAL HISTORY:   Austin Robbins returns as scheduled. He completed cycle 1 of FOLFOX on 12/25/2013. He denies nausea/vomiting. No mouth sores. No diarrhea. He had mild cold sensitivity for 4-5 days. No persistent neuropathy symptoms.  Objective:  Vital signs in last 24 hours:  Blood pressure 136/74, pulse 63, temperature 97.9 F (36.6 C), temperature source Oral, resp. rate 18, height 6' (1.829 m), weight 212 lb 4.8 oz (96.299 kg), SpO2 100.00%.    HEENT: No thrush or ulcerations. Resp: Lungs clear. Cardio: Regular cardiac rhythm. GI: Abdomen soft and nontender. No hepatomegaly. Right lower quadrant urostomy, left lower quadrant colostomy. Midline incision has healed. Vascular: Trace bilateral pretibial edema. Neuro: Vibratory sense intact over the fingertips per tuning fork exam.   Port-A-Cath site is without erythema.    Lab Results:  Lab Results  Component Value Date   WBC 6.2 01/08/2014   HGB 11.4* 01/08/2014   HCT 36.3* 01/08/2014   MCV 75.1* 01/08/2014   PLT 173 01/08/2014   NEUTROABS 3.7 01/08/2014    Imaging:  No results found.  Medications: I have reviewed the patient's current medications.  Assessment/Plan:  1. Clinical stage IV (T7S,V7B,L3) adenocarcinoma of the rectum with tumor directly extending to the bladder/prostate and CT scan evidence of metastatic chest adenopathy Positive K-ras mutation  Normal mismatch repair protein expression  Status post low anterior resection and end colostomy with a cystoprostatectomy and colon conduit urinary diversion 11/01/2013  Staging PET scan with hypermetabolic pelvic nodes, mediastinal nodes, left adrenal metastasis, and left upper lobe nodule. Initiation of FOLFOX 12/25/2013. 2. Microcytic anemia-likely iron deficiency. He continues oral iron. 3. Gout. 4. Port-A-Cath placement 12/18/2013.   Disposition: He appears stable. He  tolerated the first cycle of FOLFOX well. Plan to proceed with cycle 2 today as scheduled. He will return for a followup visit and cycle 3 in 2 weeks. He will contact the office in the interim with any problems.  He requested a new prescription for Xanax at today's visit. We will call to his pharmacy.  Plan reviewed with Dr. Benay Spice.    Owens Shark ANP/GNP-BC   01/08/2014  11:15 AM

## 2014-01-08 NOTE — Telephone Encounter (Signed)
Called in prescription for Xanax to patient's pharmacy. Per Elby Showers. Marcello Moores

## 2014-01-08 NOTE — Progress Notes (Signed)
Austin Robbins is a 58 y.o. male who is status post a pelvic exenteration on 11/01/13.  He is s/p port placement 4/13.  He is doing well.  He is starting his 2nd round of chemotherapy.  He is tolerating this well.  His pelvic pain is minimal.  He is gaining weight and having good bowel function.    Cancer history.   1. Clinical stage IV (E1E,O7H,Q1) adenocarcinoma of the rectum with tumor directly extending to the bladder/prostate and CT scan evidence of metastatic chest adenopathy Positive K-ras mutation  Normal mismatch repair protein expression  Status post low anterior resection and end colostomy with a cystoprostatectomy and colon conduit urinary diversion 11/01/2013  Staging PET scan with hypermetabolic pelvic nodes, mediastinal nodes, left adrenal metastasis, and left upper lobe nodule.  Initiation of FOLFOX 12/25/2013. 2. Port-A-Cath placement 12/18/2013.   Objective: Filed Vitals:   01/08/14 1522  BP: 132/76  Pulse: 64  Temp: 98 F (36.7 C)  Resp: 18    General appearance: alert and cooperative GI: normal findings: soft, non-tender ostomy: pink, functioning Port site: healing well Incision: healing well   Assessment: s/p  Patient Active Problem List   Diagnosis Date Noted  . Panic attacks   . Gastritis   . Pyuria 11/01/2013  . Anemia of chronic disease 11/01/2013  . Rectal adenocarcinoma with perforation & invasion into bladder s/p LAR/cystectomy/colon conduit 11/04/2013 11/01/2013  . Fever 10/26/2013  . Personal history of colonic polyps - adenoma 10/25/2013    Plan: Doing well.  Will await imaging after chemo complete.  RTO in 3 months.    Rosario Adie, [facility], [place]   01/08/2014 3:33 PM

## 2014-01-08 NOTE — Patient Instructions (Signed)
[facility] Discharge Instructions for Patients Receiving Chemotherapy  Today you received the following chemotherapy agents leucovorin/oxaliplatin/fluorouracil.    To help prevent nausea and vomiting after your treatment, we encourage you to take your nausea medication as directed.     If you develop nausea and vomiting that is not controlled by your nausea medication, call the clinic.   BELOW ARE SYMPTOMS THAT SHOULD BE REPORTED IMMEDIATELY:  *FEVER GREATER THAN 100.5 F  *CHILLS WITH OR WITHOUT FEVER  NAUSEA AND VOMITING THAT IS NOT CONTROLLED WITH YOUR NAUSEA MEDICATION  *UNUSUAL SHORTNESS OF BREATH  *UNUSUAL BRUISING OR BLEEDING  TENDERNESS IN MOUTH AND THROAT WITH OR WITHOUT PRESENCE OF ULCERS  *URINARY PROBLEMS  *BOWEL PROBLEMS  UNUSUAL RASH Items with * indicate a potential emergency and should be followed up as soon as possible.  Feel free to call the clinic you have any questions or concerns. The clinic phone number is [phone].  

## 2014-01-10 ENCOUNTER — Ambulatory Visit (HOSPITAL_BASED_OUTPATIENT_CLINIC_OR_DEPARTMENT_OTHER): Payer: 59

## 2014-01-10 VITALS — BP 130/72 | HR 88

## 2014-01-10 DIAGNOSIS — C20 Malignant neoplasm of rectum: Secondary | ICD-10-CM

## 2014-01-10 MED ORDER — SODIUM CHLORIDE 0.9 % IJ SOLN
10.0000 mL | INTRAMUSCULAR | Status: DC | PRN
  Administered 2014-01-10: 10 mL
  Filled 2014-01-10: qty 10

## 2014-01-10 MED ORDER — HEPARIN SOD (PORK) LOCK FLUSH 100 UNIT/ML IV SOLN
500.0000 [IU] | Freq: Once | INTRAVENOUS | Status: AC | PRN
  Administered 2014-01-10: 500 [IU]
  Filled 2014-01-10: qty 5

## 2014-01-12 ENCOUNTER — Telehealth: Payer: Self-pay | Admitting: *Deleted

## 2014-01-12 ENCOUNTER — Telehealth: Payer: Self-pay | Admitting: Oncology

## 2014-01-12 NOTE — Telephone Encounter (Signed)
S/W PT GAVE APPTS 5/18 @ 10.45AM AND 6/1 @ 9.45AM. PPT VERBALIZED UNDERSTANDING.

## 2014-01-12 NOTE — Telephone Encounter (Signed)
, °

## 2014-01-12 NOTE — Telephone Encounter (Signed)
Per staff reminder I have scheduled apapts

## 2014-01-19 ENCOUNTER — Other Ambulatory Visit: Payer: Self-pay

## 2014-01-21 ENCOUNTER — Other Ambulatory Visit: Payer: Self-pay | Admitting: Oncology

## 2014-01-22 ENCOUNTER — Ambulatory Visit: Payer: 59 | Admitting: Nutrition

## 2014-01-22 ENCOUNTER — Ambulatory Visit (HOSPITAL_BASED_OUTPATIENT_CLINIC_OR_DEPARTMENT_OTHER): Payer: 59 | Admitting: Nurse Practitioner

## 2014-01-22 ENCOUNTER — Other Ambulatory Visit (HOSPITAL_BASED_OUTPATIENT_CLINIC_OR_DEPARTMENT_OTHER): Payer: 59

## 2014-01-22 ENCOUNTER — Ambulatory Visit (HOSPITAL_BASED_OUTPATIENT_CLINIC_OR_DEPARTMENT_OTHER): Payer: 59

## 2014-01-22 ENCOUNTER — Telehealth: Payer: Self-pay | Admitting: *Deleted

## 2014-01-22 ENCOUNTER — Telehealth: Payer: Self-pay | Admitting: Oncology

## 2014-01-22 VITALS — BP 129/85 | HR 53 | Temp 98.4°F | Resp 18 | Ht 72.0 in | Wt 210.1 lb

## 2014-01-22 DIAGNOSIS — C20 Malignant neoplasm of rectum: Secondary | ICD-10-CM

## 2014-01-22 DIAGNOSIS — D509 Iron deficiency anemia, unspecified: Secondary | ICD-10-CM

## 2014-01-22 DIAGNOSIS — M109 Gout, unspecified: Secondary | ICD-10-CM

## 2014-01-22 DIAGNOSIS — Z5111 Encounter for antineoplastic chemotherapy: Secondary | ICD-10-CM

## 2014-01-22 LAB — CBC WITH DIFFERENTIAL/PLATELET
BASO%: 0.6 % (ref 0.0–2.0)
Basophils Absolute: 0 10*3/uL (ref 0.0–0.1)
EOS%: 5.5 % (ref 0.0–7.0)
Eosinophils Absolute: 0.4 10*3/uL (ref 0.0–0.5)
HEMATOCRIT: 37.7 % — AB (ref 38.4–49.9)
HGB: 12.1 g/dL — ABNORMAL LOW (ref 13.0–17.1)
LYMPH%: 23.5 % (ref 14.0–49.0)
MCH: 24 pg — ABNORMAL LOW (ref 27.2–33.4)
MCHC: 32 g/dL (ref 32.0–36.0)
MCV: 75 fL — ABNORMAL LOW (ref 79.3–98.0)
MONO#: 0.6 10*3/uL (ref 0.1–0.9)
MONO%: 9.2 % (ref 0.0–14.0)
NEUT#: 4 10*3/uL (ref 1.5–6.5)
NEUT%: 61.2 % (ref 39.0–75.0)
Platelets: 143 10*3/uL (ref 140–400)
RBC: 5.03 10*6/uL (ref 4.20–5.82)
RDW: 19.7 % — ABNORMAL HIGH (ref 11.0–14.6)
WBC: 6.6 10*3/uL (ref 4.0–10.3)
lymph#: 1.5 10*3/uL (ref 0.9–3.3)

## 2014-01-22 LAB — COMPREHENSIVE METABOLIC PANEL (CC13)
ALT: 8 U/L (ref 0–55)
ANION GAP: 10 meq/L (ref 3–11)
AST: 10 U/L (ref 5–34)
Albumin: 3.2 g/dL — ABNORMAL LOW (ref 3.5–5.0)
Alkaline Phosphatase: 87 U/L (ref 40–150)
BUN: 6.9 mg/dL — ABNORMAL LOW (ref 7.0–26.0)
CALCIUM: 8.9 mg/dL (ref 8.4–10.4)
CHLORIDE: 112 meq/L — AB (ref 98–109)
CO2: 20 mEq/L — ABNORMAL LOW (ref 22–29)
Creatinine: 0.9 mg/dL (ref 0.7–1.3)
Glucose: 102 mg/dl (ref 70–140)
Potassium: 3.9 mEq/L (ref 3.5–5.1)
SODIUM: 142 meq/L (ref 136–145)
TOTAL PROTEIN: 6.5 g/dL (ref 6.4–8.3)
Total Bilirubin: 0.29 mg/dL (ref 0.20–1.20)

## 2014-01-22 MED ORDER — DEXAMETHASONE SODIUM PHOSPHATE 10 MG/ML IJ SOLN
INTRAMUSCULAR | Status: AC
  Filled 2014-01-22: qty 1

## 2014-01-22 MED ORDER — SODIUM CHLORIDE 0.9 % IV SOLN
2400.0000 mg/m2 | INTRAVENOUS | Status: DC
  Administered 2014-01-22: 5200 mg via INTRAVENOUS
  Filled 2014-01-22: qty 104

## 2014-01-22 MED ORDER — LEUCOVORIN CALCIUM INJECTION 350 MG
401.0000 mg/m2 | Freq: Once | INTRAVENOUS | Status: AC
  Administered 2014-01-22: 850 mg via INTRAVENOUS
  Filled 2014-01-22: qty 42.5

## 2014-01-22 MED ORDER — DEXAMETHASONE SODIUM PHOSPHATE 10 MG/ML IJ SOLN
10.0000 mg | Freq: Once | INTRAMUSCULAR | Status: AC
  Administered 2014-01-22: 10 mg via INTRAVENOUS

## 2014-01-22 MED ORDER — OXALIPLATIN CHEMO INJECTION 100 MG/20ML
85.0000 mg/m2 | Freq: Once | INTRAVENOUS | Status: AC
  Administered 2014-01-22: 185 mg via INTRAVENOUS
  Filled 2014-01-22: qty 37

## 2014-01-22 MED ORDER — FLUOROURACIL CHEMO INJECTION 2.5 GM/50ML
400.0000 mg/m2 | Freq: Once | INTRAVENOUS | Status: AC
  Administered 2014-01-22: 850 mg via INTRAVENOUS
  Filled 2014-01-22: qty 17

## 2014-01-22 MED ORDER — ONDANSETRON 8 MG/50ML IVPB (CHCC)
8.0000 mg | Freq: Once | INTRAVENOUS | Status: AC
  Administered 2014-01-22: 8 mg via INTRAVENOUS

## 2014-01-22 MED ORDER — ONDANSETRON 8 MG/NS 50 ML IVPB
INTRAVENOUS | Status: AC
  Filled 2014-01-22: qty 8

## 2014-01-22 MED ORDER — DEXTROSE 5 % IV SOLN
Freq: Once | INTRAVENOUS | Status: AC
  Administered 2014-01-22: 13:00:00 via INTRAVENOUS

## 2014-01-22 NOTE — Progress Notes (Signed)
  [place] OFFICE PROGRESS NOTE   Diagnosis:  Metastatic rectal cancer.  INTERVAL HISTORY:   Austin Robbins returns as scheduled. He completed cycle 2 FOLFOX on 01/08/2014. He denies nausea/vomiting. No mouth sores. No diarrhea. Cold sensitivity lasted for 5 days. He denies persistent neuropathy symptoms. He intermittently notes a small amount of blood with nose blowing. No other bleeding. He remains very active. He has a good appetite.  Objective:  Vital signs in last 24 hours:  Blood pressure 129/85, pulse 53, temperature 98.4 F (36.9 C), temperature source Oral, resp. rate 18, height 6' (1.829 m), weight 210 lb 1.6 oz (95.301 kg).    HEENT: No thrush or ulcerations. Resp: Lungs clear. Cardio: Regular cardiac rhythm. GI: Soft and nontender. No hepatomegaly. Right lower quadrant urostomy, left lower quadrant colostomy. Vascular: Trace lower leg edema bilaterally. Neuro: Vibratory sense intact over the fingertips per tuning fork exam.   Port-A-Cath site without erythema  Lab Results:  Lab Results  Component Value Date   WBC 6.6 01/22/2014   HGB 12.1* 01/22/2014   HCT 37.7* 01/22/2014   MCV 75.0* 01/22/2014   PLT 143 01/22/2014   NEUTROABS 4.0 01/22/2014    Imaging:  No results found.  Medications: I have reviewed the patient's current medications.  Assessment/Plan: 1. Clinical stage IV (U8H,F2B,M2) adenocarcinoma of the rectum with tumor directly extending to the bladder/prostate and CT scan evidence of metastatic chest adenopathy Positive K-ras mutation  Normal mismatch repair protein expression  Status post low anterior resection and end colostomy with a cystoprostatectomy and colon conduit urinary diversion 11/01/2013  Staging PET scan with hypermetabolic pelvic nodes, mediastinal nodes, left adrenal metastasis, and left upper lobe nodule.  Initiation of FOLFOX 12/25/2013. 2. Microcytic anemia-likely iron deficiency. He continues oral  iron. 3. Gout. 4. Port-A-Cath placement 12/18/2013.   Disposition: Austin Robbins appears stable. He has completed 2 cycles of FOLFOX. Plan to proceed with cycle 3 today as scheduled. He will return for a followup visit and cycle 4 in 2 weeks. He will contact the office in the interim with any problems.  Plan reviewed with Austin Robbins.    Austin Robbins ANP/GNP-BC   01/22/2014  1:34 PM

## 2014-01-22 NOTE — Progress Notes (Signed)
Nutrition followup completed with patient.  He is receiving chemotherapy for rectal cancer.  He continues to report good appetite with good energy.  He denies nausea and vomiting and mouth sores. Weight is basically stable and was documented as 210.1 pounds, May 18.  Patient has no nutrition side effects or questions today.  Nutrition diagnosis: Food and nutrition related knowledge deficit resolved.  I will continue to follow patient as needed for chemotherapy related side effects.  I will check on patient during chemotherapy on Monday, June 15.  Patient has my contact information for questions or concerns.

## 2014-01-22 NOTE — Telephone Encounter (Signed)
Gave pt appt for lab,md and chemo for May and June 2015 °

## 2014-01-22 NOTE — Telephone Encounter (Signed)
Per staff message and POF I have scheduled appts.  JMW  

## 2014-01-22 NOTE — Patient Instructions (Signed)
[place] Discharge Instructions for Patients Receiving Chemotherapy  Today you received the following chemotherapy agents : Oxaliplatin, Leucovorin, 5-FU  To help prevent nausea and vomiting after your treatment, we encourage you to take your nausea medication as directed:  Compazine 10 mg every 6 hours as needed If you develop nausea and vomiting that is not controlled by your nausea medication, call the clinic.   BELOW ARE SYMPTOMS THAT SHOULD BE REPORTED IMMEDIATELY:  *FEVER GREATER THAN 100.5 F  *CHILLS WITH OR WITHOUT FEVER  NAUSEA AND VOMITING THAT IS NOT CONTROLLED WITH YOUR NAUSEA MEDICATION  *UNUSUAL SHORTNESS OF BREATH  *UNUSUAL BRUISING OR BLEEDING  TENDERNESS IN MOUTH AND THROAT WITH OR WITHOUT PRESENCE OF ULCERS  *URINARY PROBLEMS  *BOWEL PROBLEMS  UNUSUAL RASH Items with * indicate a potential emergency and should be followed up as soon as possible.  Feel free to call the clinic should you have any questions or concerns. The clinic phone number is [phone].  It has been a pleasure to serve you today!

## 2014-01-24 ENCOUNTER — Ambulatory Visit (HOSPITAL_BASED_OUTPATIENT_CLINIC_OR_DEPARTMENT_OTHER): Payer: 59

## 2014-01-24 VITALS — BP 105/69 | HR 68 | Temp 98.3°F

## 2014-01-24 DIAGNOSIS — C20 Malignant neoplasm of rectum: Secondary | ICD-10-CM

## 2014-01-24 MED ORDER — SODIUM CHLORIDE 0.9 % IJ SOLN
10.0000 mL | INTRAMUSCULAR | Status: DC | PRN
  Administered 2014-01-24: 10 mL
  Filled 2014-01-24: qty 10

## 2014-01-24 MED ORDER — HEPARIN SOD (PORK) LOCK FLUSH 100 UNIT/ML IV SOLN
500.0000 [IU] | Freq: Once | INTRAVENOUS | Status: AC | PRN
  Administered 2014-01-24: 500 [IU]
  Filled 2014-01-24: qty 5

## 2014-01-29 ENCOUNTER — Other Ambulatory Visit: Payer: Self-pay | Admitting: Oncology

## 2014-02-05 ENCOUNTER — Other Ambulatory Visit (HOSPITAL_BASED_OUTPATIENT_CLINIC_OR_DEPARTMENT_OTHER): Payer: 59

## 2014-02-05 ENCOUNTER — Ambulatory Visit (HOSPITAL_BASED_OUTPATIENT_CLINIC_OR_DEPARTMENT_OTHER): Payer: 59

## 2014-02-05 ENCOUNTER — Telehealth: Payer: Self-pay | Admitting: Oncology

## 2014-02-05 ENCOUNTER — Ambulatory Visit (HOSPITAL_BASED_OUTPATIENT_CLINIC_OR_DEPARTMENT_OTHER): Payer: 59 | Admitting: Oncology

## 2014-02-05 VITALS — BP 132/75 | HR 55 | Temp 98.3°F | Resp 18 | Ht 72.0 in | Wt 211.8 lb

## 2014-02-05 DIAGNOSIS — C20 Malignant neoplasm of rectum: Secondary | ICD-10-CM

## 2014-02-05 DIAGNOSIS — Z5111 Encounter for antineoplastic chemotherapy: Secondary | ICD-10-CM

## 2014-02-05 DIAGNOSIS — C7919 Secondary malignant neoplasm of other urinary organs: Secondary | ICD-10-CM

## 2014-02-05 DIAGNOSIS — C7982 Secondary malignant neoplasm of genital organs: Secondary | ICD-10-CM

## 2014-02-05 DIAGNOSIS — D509 Iron deficiency anemia, unspecified: Secondary | ICD-10-CM

## 2014-02-05 DIAGNOSIS — C7970 Secondary malignant neoplasm of unspecified adrenal gland: Secondary | ICD-10-CM

## 2014-02-05 DIAGNOSIS — M109 Gout, unspecified: Secondary | ICD-10-CM

## 2014-02-05 LAB — COMPREHENSIVE METABOLIC PANEL (CC13)
ALBUMIN: 3.1 g/dL — AB (ref 3.5–5.0)
ALK PHOS: 91 U/L (ref 40–150)
ALT: 11 U/L (ref 0–55)
ANION GAP: 12 meq/L — AB (ref 3–11)
AST: 12 U/L (ref 5–34)
BILIRUBIN TOTAL: 0.25 mg/dL (ref 0.20–1.20)
BUN: 11 mg/dL (ref 7.0–26.0)
CO2: 17 meq/L — AB (ref 22–29)
Calcium: 8.6 mg/dL (ref 8.4–10.4)
Chloride: 112 mEq/L — ABNORMAL HIGH (ref 98–109)
Creatinine: 0.8 mg/dL (ref 0.7–1.3)
GLUCOSE: 108 mg/dL (ref 70–140)
POTASSIUM: 3.5 meq/L (ref 3.5–5.1)
SODIUM: 141 meq/L (ref 136–145)
Total Protein: 6.6 g/dL (ref 6.4–8.3)

## 2014-02-05 LAB — CBC WITH DIFFERENTIAL/PLATELET
BASO%: 0.7 % (ref 0.0–2.0)
Basophils Absolute: 0 10*3/uL (ref 0.0–0.1)
EOS ABS: 0.8 10*3/uL — AB (ref 0.0–0.5)
EOS%: 14 % — ABNORMAL HIGH (ref 0.0–7.0)
HCT: 37.7 % — ABNORMAL LOW (ref 38.4–49.9)
HGB: 12.4 g/dL — ABNORMAL LOW (ref 13.0–17.1)
LYMPH%: 31.1 % (ref 14.0–49.0)
MCH: 24.7 pg — ABNORMAL LOW (ref 27.2–33.4)
MCHC: 32.8 g/dL (ref 32.0–36.0)
MCV: 75.2 fL — ABNORMAL LOW (ref 79.3–98.0)
MONO#: 0.5 10*3/uL (ref 0.1–0.9)
MONO%: 8.5 % (ref 0.0–14.0)
NEUT%: 45.7 % (ref 39.0–75.0)
NEUTROS ABS: 2.5 10*3/uL (ref 1.5–6.5)
Platelets: 132 10*3/uL — ABNORMAL LOW (ref 140–400)
RBC: 5.01 10*6/uL (ref 4.20–5.82)
RDW: 19.8 % — ABNORMAL HIGH (ref 11.0–14.6)
WBC: 5.5 10*3/uL (ref 4.0–10.3)
lymph#: 1.7 10*3/uL (ref 0.9–3.3)

## 2014-02-05 MED ORDER — SODIUM CHLORIDE 0.9 % IV SOLN
2400.0000 mg/m2 | INTRAVENOUS | Status: DC
  Administered 2014-02-05: 5200 mg via INTRAVENOUS
  Filled 2014-02-05: qty 104

## 2014-02-05 MED ORDER — DEXAMETHASONE SODIUM PHOSPHATE 10 MG/ML IJ SOLN
10.0000 mg | Freq: Once | INTRAMUSCULAR | Status: AC
  Administered 2014-02-05: 10 mg via INTRAVENOUS

## 2014-02-05 MED ORDER — FLUOROURACIL CHEMO INJECTION 2.5 GM/50ML
400.0000 mg/m2 | Freq: Once | INTRAVENOUS | Status: AC
  Administered 2014-02-05: 850 mg via INTRAVENOUS
  Filled 2014-02-05: qty 17

## 2014-02-05 MED ORDER — LEUCOVORIN CALCIUM INJECTION 350 MG
401.0000 mg/m2 | Freq: Once | INTRAVENOUS | Status: AC
  Administered 2014-02-05: 850 mg via INTRAVENOUS
  Filled 2014-02-05: qty 42.5

## 2014-02-05 MED ORDER — ONDANSETRON 8 MG/NS 50 ML IVPB
INTRAVENOUS | Status: AC
  Filled 2014-02-05: qty 8

## 2014-02-05 MED ORDER — OXALIPLATIN CHEMO INJECTION 100 MG/20ML
85.0000 mg/m2 | Freq: Once | INTRAVENOUS | Status: AC
  Administered 2014-02-05: 185 mg via INTRAVENOUS
  Filled 2014-02-05: qty 37

## 2014-02-05 MED ORDER — DEXAMETHASONE SODIUM PHOSPHATE 10 MG/ML IJ SOLN
INTRAMUSCULAR | Status: AC
  Filled 2014-02-05: qty 1

## 2014-02-05 MED ORDER — DEXTROSE 5 % IV SOLN
Freq: Once | INTRAVENOUS | Status: AC
  Administered 2014-02-05: 11:00:00 via INTRAVENOUS

## 2014-02-05 MED ORDER — ONDANSETRON 8 MG/50ML IVPB (CHCC)
8.0000 mg | Freq: Once | INTRAVENOUS | Status: AC
  Administered 2014-02-05: 8 mg via INTRAVENOUS

## 2014-02-05 NOTE — Progress Notes (Signed)
  [place] OFFICE PROGRESS NOTE   Diagnosis: Rectal Cancer  INTERVAL HISTORY:   Mr. Spellman returns as scheduled. He completed a third cycle of FOLFOX 01/22/2014. He reports soft stool in one episode of diarrhea following chemotherapy. Cold sensitivity lasted 3-4 days. No numbness. He has intermittent "gout "discomfort in the left knee and left great toe.  He is working.  Objective:  Vital signs in last 24 hours:  Blood pressure 132/75, pulse 55, temperature 98.3 F (36.8 C), temperature source Oral, resp. rate 18, height 6' (1.829 m), weight 211 lb 12.8 oz (96.072 kg), SpO2 100.00%.    HEENT: No thrush or ulcers Resp: Lungs clear bilaterally Cardio: Regular rate and rhythm GI: No hepatomegaly, soft, nontender, left lower quadrant colostomy, right lower quadrant urostomy Vascular: No leg edema   Portacath/PICC-without erythema  Lab Results:  Lab Results  Component Value Date   WBC 5.5 02/05/2014   HGB 12.4* 02/05/2014   HCT 37.7* 02/05/2014   MCV 75.2* 02/05/2014   PLT 132* 02/05/2014   NEUTROABS 2.5 02/05/2014    Medications: I have reviewed the patient's current medications.  Assessment/Plan: 1. Clinical stage IV (G8B,V6X,I5) adenocarcinoma of the rectum with tumor directly extending to the bladder/prostate and CT scan evidence of metastatic chest adenopathy Positive K-ras mutation  Normal mismatch repair protein expression  Status post low anterior resection and end colostomy with a cystoprostatectomy and colon conduit urinary diversion 11/01/2013  Staging PET scan with hypermetabolic pelvic nodes, mediastinal nodes, left adrenal metastasis, and left upper lobe nodule.  Initiation of FOLFOX 12/25/2013. 2. Microcytic anemia-likely iron deficiency, improved. He continues oral iron. 3. Gout. 4. Port-A-Cath placement 12/18/2013.   Disposition:  Mr. Kubisiak appears stable. He continues to tolerate the chemotherapy well. The plan is to proceed with cycle 4  of FOLFOX today. He will return for an office visit and chemotherapy in 2 weeks. The plan is to complete a restaging CT evaluation after 6 cycles of FOLFOX.  Ladell Pier, MD  02/05/2014  10:36 AM

## 2014-02-05 NOTE — Patient Instructions (Signed)
[facility] Discharge Instructions for Patients Receiving Chemotherapy  Today you received the following chemotherapy agents oxaliplatin/leucovorin/fluorouracil  To help prevent nausea and vomiting after your treatment, we encourage you to take your nausea medication as directed If you develop nausea and vomiting that is not controlled by your nausea medication, call the clinic.   BELOW ARE SYMPTOMS THAT SHOULD BE REPORTED IMMEDIATELY:  *FEVER GREATER THAN 100.5 F  *CHILLS WITH OR WITHOUT FEVER  NAUSEA AND VOMITING THAT IS NOT CONTROLLED WITH YOUR NAUSEA MEDICATION  *UNUSUAL SHORTNESS OF BREATH  *UNUSUAL BRUISING OR BLEEDING  TENDERNESS IN MOUTH AND THROAT WITH OR WITHOUT PRESENCE OF ULCERS  *URINARY PROBLEMS  *BOWEL PROBLEMS  UNUSUAL RASH Items with * indicate a potential emergency and should be followed up as soon as possible.  Feel free to call the clinic you have any questions or concerns. The clinic phone number is [phone].  

## 2014-02-05 NOTE — Telephone Encounter (Signed)
ADDED APPTS FOR 6/29 AND 7/1. PT GIVEN NEW SCHEDULE WHILE IN INF AREA.

## 2014-02-07 ENCOUNTER — Ambulatory Visit (HOSPITAL_BASED_OUTPATIENT_CLINIC_OR_DEPARTMENT_OTHER): Payer: 59

## 2014-02-07 VITALS — BP 119/66 | HR 60

## 2014-02-07 DIAGNOSIS — C20 Malignant neoplasm of rectum: Secondary | ICD-10-CM

## 2014-02-07 MED ORDER — HEPARIN SOD (PORK) LOCK FLUSH 100 UNIT/ML IV SOLN
500.0000 [IU] | Freq: Once | INTRAVENOUS | Status: AC | PRN
  Administered 2014-02-07: 500 [IU]
  Filled 2014-02-07: qty 5

## 2014-02-07 MED ORDER — SODIUM CHLORIDE 0.9 % IJ SOLN
10.0000 mL | INTRAMUSCULAR | Status: DC | PRN
  Administered 2014-02-07: 10 mL
  Filled 2014-02-07: qty 10

## 2014-02-15 ENCOUNTER — Other Ambulatory Visit: Payer: Self-pay | Admitting: General Practice

## 2014-02-18 ENCOUNTER — Other Ambulatory Visit: Payer: Self-pay | Admitting: Oncology

## 2014-02-19 ENCOUNTER — Telehealth: Payer: Self-pay | Admitting: Oncology

## 2014-02-19 ENCOUNTER — Ambulatory Visit (HOSPITAL_BASED_OUTPATIENT_CLINIC_OR_DEPARTMENT_OTHER): Payer: 59

## 2014-02-19 ENCOUNTER — Other Ambulatory Visit (HOSPITAL_BASED_OUTPATIENT_CLINIC_OR_DEPARTMENT_OTHER): Payer: 59

## 2014-02-19 ENCOUNTER — Ambulatory Visit: Payer: 59 | Admitting: Nutrition

## 2014-02-19 ENCOUNTER — Telehealth: Payer: Self-pay | Admitting: *Deleted

## 2014-02-19 ENCOUNTER — Ambulatory Visit (HOSPITAL_BASED_OUTPATIENT_CLINIC_OR_DEPARTMENT_OTHER): Payer: 59 | Admitting: Nurse Practitioner

## 2014-02-19 VITALS — BP 134/76 | HR 59 | Temp 97.7°F | Resp 18 | Ht 72.0 in | Wt 212.6 lb

## 2014-02-19 DIAGNOSIS — C7970 Secondary malignant neoplasm of unspecified adrenal gland: Secondary | ICD-10-CM

## 2014-02-19 DIAGNOSIS — C20 Malignant neoplasm of rectum: Secondary | ICD-10-CM

## 2014-02-19 DIAGNOSIS — R911 Solitary pulmonary nodule: Secondary | ICD-10-CM

## 2014-02-19 DIAGNOSIS — D696 Thrombocytopenia, unspecified: Secondary | ICD-10-CM

## 2014-02-19 DIAGNOSIS — C7919 Secondary malignant neoplasm of other urinary organs: Secondary | ICD-10-CM

## 2014-02-19 DIAGNOSIS — D509 Iron deficiency anemia, unspecified: Secondary | ICD-10-CM

## 2014-02-19 DIAGNOSIS — C792 Secondary malignant neoplasm of skin: Secondary | ICD-10-CM

## 2014-02-19 DIAGNOSIS — Z5111 Encounter for antineoplastic chemotherapy: Secondary | ICD-10-CM

## 2014-02-19 DIAGNOSIS — F411 Generalized anxiety disorder: Secondary | ICD-10-CM

## 2014-02-19 LAB — CBC WITH DIFFERENTIAL/PLATELET
BASO%: 0.8 % (ref 0.0–2.0)
BASOS ABS: 0 10*3/uL (ref 0.0–0.1)
EOS%: 6.8 % (ref 0.0–7.0)
Eosinophils Absolute: 0.4 10*3/uL (ref 0.0–0.5)
HCT: 38.9 % (ref 38.4–49.9)
HEMOGLOBIN: 12.7 g/dL — AB (ref 13.0–17.1)
LYMPH%: 38 % (ref 14.0–49.0)
MCH: 24.8 pg — AB (ref 27.2–33.4)
MCHC: 32.6 g/dL (ref 32.0–36.0)
MCV: 76.1 fL — AB (ref 79.3–98.0)
MONO#: 0.5 10*3/uL (ref 0.1–0.9)
MONO%: 8.5 % (ref 0.0–14.0)
NEUT#: 2.7 10*3/uL (ref 1.5–6.5)
NEUT%: 45.9 % (ref 39.0–75.0)
Platelets: 102 10*3/uL — ABNORMAL LOW (ref 140–400)
RBC: 5.11 10*6/uL (ref 4.20–5.82)
RDW: 20.3 % — ABNORMAL HIGH (ref 11.0–14.6)
WBC: 5.9 10*3/uL (ref 4.0–10.3)
lymph#: 2.2 10*3/uL (ref 0.9–3.3)

## 2014-02-19 LAB — COMPREHENSIVE METABOLIC PANEL (CC13)
ALT: 11 U/L (ref 0–55)
ANION GAP: 10 meq/L (ref 3–11)
AST: 14 U/L (ref 5–34)
Albumin: 3.3 g/dL — ABNORMAL LOW (ref 3.5–5.0)
Alkaline Phosphatase: 92 U/L (ref 40–150)
BILIRUBIN TOTAL: 0.31 mg/dL (ref 0.20–1.20)
BUN: 11.7 mg/dL (ref 7.0–26.0)
CO2: 19 meq/L — AB (ref 22–29)
CREATININE: 0.9 mg/dL (ref 0.7–1.3)
Calcium: 8.6 mg/dL (ref 8.4–10.4)
Chloride: 111 mEq/L — ABNORMAL HIGH (ref 98–109)
GLUCOSE: 116 mg/dL (ref 70–140)
Potassium: 3.9 mEq/L (ref 3.5–5.1)
Sodium: 140 mEq/L (ref 136–145)
Total Protein: 6.9 g/dL (ref 6.4–8.3)

## 2014-02-19 MED ORDER — SODIUM CHLORIDE 0.9 % IV SOLN
2400.0000 mg/m2 | INTRAVENOUS | Status: DC
  Administered 2014-02-19: 5200 mg via INTRAVENOUS
  Filled 2014-02-19: qty 104

## 2014-02-19 MED ORDER — DEXAMETHASONE SODIUM PHOSPHATE 10 MG/ML IJ SOLN
INTRAMUSCULAR | Status: AC
  Filled 2014-02-19: qty 1

## 2014-02-19 MED ORDER — FLUOROURACIL CHEMO INJECTION 2.5 GM/50ML
400.0000 mg/m2 | Freq: Once | INTRAVENOUS | Status: AC
  Administered 2014-02-19: 850 mg via INTRAVENOUS
  Filled 2014-02-19: qty 17

## 2014-02-19 MED ORDER — DEXTROSE 5 % IV SOLN
Freq: Once | INTRAVENOUS | Status: AC
  Administered 2014-02-19: 12:00:00 via INTRAVENOUS

## 2014-02-19 MED ORDER — ONDANSETRON 8 MG/50ML IVPB (CHCC)
8.0000 mg | Freq: Once | INTRAVENOUS | Status: AC
  Administered 2014-02-19: 8 mg via INTRAVENOUS

## 2014-02-19 MED ORDER — ALPRAZOLAM 1 MG PO TABS: ORAL_TABLET | ORAL | Status: DC

## 2014-02-19 MED ORDER — ONDANSETRON 8 MG/NS 50 ML IVPB
INTRAVENOUS | Status: AC
  Filled 2014-02-19: qty 8

## 2014-02-19 MED ORDER — DEXTROSE 5 % IV SOLN
85.0000 mg/m2 | Freq: Once | INTRAVENOUS | Status: AC
  Administered 2014-02-19: 185 mg via INTRAVENOUS
  Filled 2014-02-19: qty 37

## 2014-02-19 MED ORDER — DEXAMETHASONE SODIUM PHOSPHATE 10 MG/ML IJ SOLN
10.0000 mg | Freq: Once | INTRAMUSCULAR | Status: AC
  Administered 2014-02-19: 10 mg via INTRAVENOUS

## 2014-02-19 MED ORDER — LEUCOVORIN CALCIUM INJECTION 350 MG
401.0000 mg/m2 | Freq: Once | INTRAVENOUS | Status: AC
  Administered 2014-02-19: 850 mg via INTRAVENOUS
  Filled 2014-02-19: qty 42.5

## 2014-02-19 NOTE — Telephone Encounter (Signed)
Per staff message and POF I have scheduled appts. Advised scheduler of appts. JMW  

## 2014-02-19 NOTE — Patient Instructions (Signed)
[facility] Discharge Instructions for Patients Receiving Chemotherapy  Today you received the following chemotherapy agents FOLFOX  To help prevent nausea and vomiting after your treatment, we encourage you to take your nausea medication as needed   If you develop nausea and vomiting that is not controlled by your nausea medication, call the clinic.   BELOW ARE SYMPTOMS THAT SHOULD BE REPORTED IMMEDIATELY:  *FEVER GREATER THAN 100.5 F  *CHILLS WITH OR WITHOUT FEVER  NAUSEA AND VOMITING THAT IS NOT CONTROLLED WITH YOUR NAUSEA MEDICATION  *UNUSUAL SHORTNESS OF BREATH  *UNUSUAL BRUISING OR BLEEDING  TENDERNESS IN MOUTH AND THROAT WITH OR WITHOUT PRESENCE OF ULCERS  *URINARY PROBLEMS  *BOWEL PROBLEMS  UNUSUAL RASH Items with * indicate a potential emergency and should be followed up as soon as possible.  Feel free to call the clinic you have any questions or concerns. The clinic phone number is [phone].    

## 2014-02-19 NOTE — Progress Notes (Signed)
  [place] OFFICE PROGRESS NOTE   Diagnosis:  Rectal cancer.  INTERVAL HISTORY:   Austin Robbins returns as scheduled. He completed cycle 4 FOLFOX on 02/05/2014. He denies nausea/vomiting. No mouth sores. He has mainly soft stools of "pudding" consistency. No watery stools. Cold sensitivity lasted 8 days. No persistent neuropathy symptoms. He reports developing 2 "bumps" along the skin surrounding the urostomy site. He notes some associated pain. He applied a powder with improvement. He occasionally notes a small amount of blood from the urostomy site.  He takes Xanax as needed for anxiety. He requests a new prescription today.  Objective:  Vital signs in last 24 hours:  Blood pressure 134/76, pulse 59, temperature 97.7 F (36.5 C), temperature source Oral, resp. rate 18, height 6' (1.829 m), weight 212 lb 9.6 oz (96.435 kg), SpO2 100.00%.    HEENT: No thrush or ulcerations. Resp: Lungs clear. Cardio: Regular cardiac rhythm. GI: Soft and nontender. No hepatomegaly. Left lower quadrant colostomy. Right lower quadrant urostomy. Vascular: No leg edema. Neuro: Vibratory sense intact over the fingertips per tuning fork exam.  Skin: No rash.  Port-A-Cath site without erythema.    Lab Results:  Lab Results  Component Value Date   WBC 5.9 02/19/2014   HGB 12.7* 02/19/2014   HCT 38.9 02/19/2014   MCV 76.1* 02/19/2014   PLT 102* 02/19/2014   NEUTROABS 2.7 02/19/2014    Imaging:  No results found.  Medications: I have reviewed the patient's current medications.  Assessment/Plan: 1. Clinical stage IV (T5V,U0E,B3) adenocarcinoma of the rectum with tumor directly extending to the bladder/prostate and CT scan evidence of metastatic chest adenopathy Positive K-ras mutation  Normal mismatch repair protein expression  Status post low anterior resection and end colostomy with a cystoprostatectomy and colon conduit urinary diversion 11/01/2013  Staging PET scan with  hypermetabolic pelvic nodes, mediastinal nodes, left adrenal metastasis, and left upper lobe nodule.  Initiation of FOLFOX 12/25/2013. 2. Microcytic anemia-likely iron deficiency, improved. He continues oral iron. 3. Gout. 4. Port-A-Cath placement 12/18/2013. 5. Mild thrombocytopenia related to chemotherapy. 6. Anxiety. He takes Xanax as needed.   Disposition: Austin Robbins appears stable. He has completed 4 cycles of FOLFOX. Plan to proceed with cycle 5 today as scheduled. He will return for a followup visit and cycle 6 in 2 weeks. Restaging CT evaluation planned after cycle 6.  If he continues to have discomfort at the urostomy site we will refer him to the ostomy nurse.    [name] ANP/GNP-BC   02/19/2014  10:38 AM

## 2014-02-19 NOTE — Telephone Encounter (Signed)
, °

## 2014-02-19 NOTE — Progress Notes (Signed)
Nutrition followup completed with patient and wife.  Patient receiving chemotherapy for rectal cancer.  Patient reports good appetite with good energy continues.  He denies nutritional side effects.  Weight increased to 212.6 pounds June 15 from 210.1 pounds on May 18.  There is no nutrition related to diagnosis.  Patient has my contact information for questions or concerns.  Will check on patient during chemotherapy in one month.

## 2014-02-21 ENCOUNTER — Ambulatory Visit (HOSPITAL_BASED_OUTPATIENT_CLINIC_OR_DEPARTMENT_OTHER): Payer: 59

## 2014-02-21 VITALS — BP 114/57 | HR 63 | Temp 98.1°F | Resp 16

## 2014-02-21 DIAGNOSIS — C20 Malignant neoplasm of rectum: Secondary | ICD-10-CM

## 2014-02-21 DIAGNOSIS — C7970 Secondary malignant neoplasm of unspecified adrenal gland: Secondary | ICD-10-CM

## 2014-02-21 DIAGNOSIS — C7919 Secondary malignant neoplasm of other urinary organs: Secondary | ICD-10-CM

## 2014-02-21 MED ORDER — HEPARIN SOD (PORK) LOCK FLUSH 100 UNIT/ML IV SOLN
500.0000 [IU] | Freq: Once | INTRAVENOUS | Status: AC | PRN
  Administered 2014-02-21: 500 [IU]
  Filled 2014-02-21: qty 5

## 2014-02-21 MED ORDER — SODIUM CHLORIDE 0.9 % IJ SOLN
10.0000 mL | INTRAMUSCULAR | Status: DC | PRN
  Administered 2014-02-21: 10 mL
  Filled 2014-02-21: qty 10

## 2014-03-04 ENCOUNTER — Other Ambulatory Visit: Payer: Self-pay | Admitting: Oncology

## 2014-03-05 ENCOUNTER — Ambulatory Visit (HOSPITAL_BASED_OUTPATIENT_CLINIC_OR_DEPARTMENT_OTHER): Payer: 59 | Admitting: Nurse Practitioner

## 2014-03-05 ENCOUNTER — Ambulatory Visit (HOSPITAL_BASED_OUTPATIENT_CLINIC_OR_DEPARTMENT_OTHER): Payer: 59

## 2014-03-05 ENCOUNTER — Other Ambulatory Visit (HOSPITAL_BASED_OUTPATIENT_CLINIC_OR_DEPARTMENT_OTHER): Payer: 59

## 2014-03-05 ENCOUNTER — Telehealth: Payer: Self-pay | Admitting: Oncology

## 2014-03-05 VITALS — BP 142/84 | HR 66 | Temp 98.1°F | Resp 20 | Ht 72.0 in | Wt 212.0 lb

## 2014-03-05 DIAGNOSIS — D6959 Other secondary thrombocytopenia: Secondary | ICD-10-CM

## 2014-03-05 DIAGNOSIS — C20 Malignant neoplasm of rectum: Secondary | ICD-10-CM

## 2014-03-05 DIAGNOSIS — M25469 Effusion, unspecified knee: Secondary | ICD-10-CM

## 2014-03-05 DIAGNOSIS — C778 Secondary and unspecified malignant neoplasm of lymph nodes of multiple regions: Secondary | ICD-10-CM

## 2014-03-05 DIAGNOSIS — Z5111 Encounter for antineoplastic chemotherapy: Secondary | ICD-10-CM

## 2014-03-05 LAB — COMPREHENSIVE METABOLIC PANEL (CC13)
ALT: 18 U/L (ref 0–55)
ANION GAP: 9 meq/L (ref 3–11)
AST: 18 U/L (ref 5–34)
Albumin: 3.3 g/dL — ABNORMAL LOW (ref 3.5–5.0)
Alkaline Phosphatase: 96 U/L (ref 40–150)
BUN: 8.3 mg/dL (ref 7.0–26.0)
CALCIUM: 8.9 mg/dL (ref 8.4–10.4)
CO2: 23 meq/L (ref 22–29)
CREATININE: 0.9 mg/dL (ref 0.7–1.3)
Chloride: 108 mEq/L (ref 98–109)
Glucose: 131 mg/dl (ref 70–140)
Potassium: 3.9 mEq/L (ref 3.5–5.1)
Sodium: 140 mEq/L (ref 136–145)
Total Bilirubin: 0.57 mg/dL (ref 0.20–1.20)
Total Protein: 6.9 g/dL (ref 6.4–8.3)

## 2014-03-05 LAB — CBC WITH DIFFERENTIAL/PLATELET
BASO%: 0.3 % (ref 0.0–2.0)
BASOS ABS: 0 10*3/uL (ref 0.0–0.1)
EOS%: 3.1 % (ref 0.0–7.0)
Eosinophils Absolute: 0.3 10*3/uL (ref 0.0–0.5)
HEMATOCRIT: 41 % (ref 38.4–49.9)
HGB: 13 g/dL (ref 13.0–17.1)
LYMPH%: 19.3 % (ref 14.0–49.0)
MCH: 24.8 pg — ABNORMAL LOW (ref 27.2–33.4)
MCHC: 31.8 g/dL — ABNORMAL LOW (ref 32.0–36.0)
MCV: 77.9 fL — ABNORMAL LOW (ref 79.3–98.0)
MONO#: 1 10*3/uL — AB (ref 0.1–0.9)
MONO%: 10.1 % (ref 0.0–14.0)
NEUT%: 67.2 % (ref 39.0–75.0)
NEUTROS ABS: 6.5 10*3/uL (ref 1.5–6.5)
Platelets: 94 10*3/uL — ABNORMAL LOW (ref 140–400)
RBC: 5.26 10*6/uL (ref 4.20–5.82)
RDW: 23 % — AB (ref 11.0–14.6)
WBC: 9.6 10*3/uL (ref 4.0–10.3)
lymph#: 1.8 10*3/uL (ref 0.9–3.3)

## 2014-03-05 MED ORDER — SODIUM CHLORIDE 0.9 % IV SOLN
2400.0000 mg/m2 | INTRAVENOUS | Status: DC
  Administered 2014-03-05: 5200 mg via INTRAVENOUS
  Filled 2014-03-05: qty 104

## 2014-03-05 MED ORDER — DEXAMETHASONE SODIUM PHOSPHATE 10 MG/ML IJ SOLN
INTRAMUSCULAR | Status: AC
  Filled 2014-03-05: qty 1

## 2014-03-05 MED ORDER — OXYCODONE-ACETAMINOPHEN 5-325 MG PO TABS: 1.0000 | ORAL_TABLET | ORAL | Status: DC | PRN

## 2014-03-05 MED ORDER — OXALIPLATIN CHEMO INJECTION 100 MG/20ML
85.0000 mg/m2 | Freq: Once | INTRAVENOUS | Status: AC
  Administered 2014-03-05: 185 mg via INTRAVENOUS
  Filled 2014-03-05: qty 37

## 2014-03-05 MED ORDER — ONDANSETRON 8 MG/NS 50 ML IVPB
INTRAVENOUS | Status: AC
  Filled 2014-03-05: qty 8

## 2014-03-05 MED ORDER — LEUCOVORIN CALCIUM INJECTION 350 MG
401.0000 mg/m2 | Freq: Once | INTRAVENOUS | Status: AC
  Administered 2014-03-05: 850 mg via INTRAVENOUS
  Filled 2014-03-05: qty 42.5

## 2014-03-05 MED ORDER — ONDANSETRON 8 MG/50ML IVPB (CHCC)
8.0000 mg | Freq: Once | INTRAVENOUS | Status: AC
  Administered 2014-03-05: 8 mg via INTRAVENOUS

## 2014-03-05 MED ORDER — METHYLPREDNISOLONE (PAK) 4 MG PO TABS: ORAL_TABLET | ORAL | Status: DC

## 2014-03-05 MED ORDER — DEXAMETHASONE SODIUM PHOSPHATE 10 MG/ML IJ SOLN
10.0000 mg | Freq: Once | INTRAMUSCULAR | Status: AC
  Administered 2014-03-05: 10 mg via INTRAVENOUS

## 2014-03-05 MED ORDER — DEXTROSE 5 % IV SOLN
Freq: Once | INTRAVENOUS | Status: AC
  Administered 2014-03-05: 12:00:00 via INTRAVENOUS

## 2014-03-05 MED ORDER — FLUOROURACIL CHEMO INJECTION 2.5 GM/50ML
400.0000 mg/m2 | Freq: Once | INTRAVENOUS | Status: AC
  Administered 2014-03-05: 850 mg via INTRAVENOUS
  Filled 2014-03-05: qty 17

## 2014-03-05 NOTE — Patient Instructions (Signed)
[facility] Discharge Instructions for Patients Receiving Chemotherapy  Today you received the following chemotherapy agents: Oxaliplatin, Leucovorin, Adrucil.  To help prevent nausea and vomiting after your treatment, we encourage you to take your nausea medication as prescribed.   If you develop nausea and vomiting that is not controlled by your nausea medication, call the clinic.   BELOW ARE SYMPTOMS THAT SHOULD BE REPORTED IMMEDIATELY:  *FEVER GREATER THAN 100.5 F  *CHILLS WITH OR WITHOUT FEVER  NAUSEA AND VOMITING THAT IS NOT CONTROLLED WITH YOUR NAUSEA MEDICATION  *UNUSUAL SHORTNESS OF BREATH  *UNUSUAL BRUISING OR BLEEDING  TENDERNESS IN MOUTH AND THROAT WITH OR WITHOUT PRESENCE OF ULCERS  *URINARY PROBLEMS  *BOWEL PROBLEMS  UNUSUAL RASH Items with * indicate a potential emergency and should be followed up as soon as possible.  Feel free to call the clinic you have any questions or concerns. The clinic phone number is [phone].    

## 2014-03-05 NOTE — Progress Notes (Signed)
Per office note by Lattie Haw, NP on 03/05/14 okay to proceed with treatment with platelets 94.

## 2014-03-05 NOTE — Progress Notes (Signed)
  [place] OFFICE PROGRESS NOTE   Diagnosis:  Rectal cancer.  INTERVAL HISTORY:   Austin Robbins returns as scheduled. He completed cycle 5 FOLFOX on 02/19/2014. He denies nausea/vomiting. No mouth sores. No diarrhea. He noted cold sensitivity until yesterday. No neuropathy symptoms in the absence of cold exposure. No shortness of breath. No chest pain. He developed left knee pain and swelling beginning 03/02/2014. He wonders if he has gout. He requests a prescription for pain medication.  Objective:  Vital signs in last 24 hours:  Blood pressure 142/84, pulse 66, temperature 98.1 F (36.7 C), temperature source Oral, resp. rate 20, height 6' (1.829 m), weight 212 lb (96.163 kg).    HEENT: No thrush or ulcerations. Resp: Lungs clear. Cardio: Regular cardiac rhythm. GI: Abdomen soft and nontender. No hepatomegaly. Left lower quadrant colostomy. Right lower quadrant urostomy. Vascular: No leg edema. Calves nontender. Neuro: Vibratory sense intact over the fingertips per tuning fork exam.  MSK: Left knee is edematous, mainly suprapatellar.    Lab Results:  Lab Results  Component Value Date   WBC 9.6 03/05/2014   HGB 13.0 03/05/2014   HCT 41.0 03/05/2014   MCV 77.9* 03/05/2014   PLT 94* 03/05/2014   NEUTROABS 6.5 03/05/2014    Imaging:  No results found.  Medications: I have reviewed the patient's current medications.  Assessment/Plan: 1. Clinical stage IV (Z6X,W9U,E4) adenocarcinoma of the rectum with tumor directly extending to the bladder/prostate and CT scan evidence of metastatic chest adenopathy Positive K-ras mutation.  Normal mismatch repair protein expression.  Status post low anterior resection and end colostomy with a cystoprostatectomy and colon conduit urinary diversion 11/01/2013.  Staging PET scan with hypermetabolic pelvic nodes, mediastinal nodes, left adrenal metastasis, and left upper lobe nodule.  Initiation of FOLFOX  12/25/2013. 2. Microcytic anemia-likely iron deficiency, improved. He continues oral iron. 3. Gout. 4. Port-A-Cath placement 12/18/2013. 5. Mild thrombocytopenia related to chemotherapy. 6. Anxiety. He takes Xanax as needed. 7. Left knee pain and swelling. 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   Disposition: Austin Robbins has completed 5 cycles of FOLFOX. He continues to have mild thrombocytopenia. Plan to proceed with cycle 6 today as scheduled. We are referring him for a restaging CT evaluation on 03/16/2014. He will return for a followup visit to review the results on 03/19/2014.  He appears to have a left knee effusion. Question related to gout, question related to osteoarthritis. He will complete a Medrol Dosepak. If the symptoms do not improve over the next few days we will make a referral to orthopedics.  Austin Robbins will contact the office prior to his next visit with any problems.  Patient seen with Dr. Benay Spice.    [name] ANP/GNP-BC   03/05/2014  11:21 AM

## 2014-03-05 NOTE — Telephone Encounter (Signed)
gv adn printed appt sched and avs for pt for July....gv pt barium °

## 2014-03-07 ENCOUNTER — Ambulatory Visit (HOSPITAL_BASED_OUTPATIENT_CLINIC_OR_DEPARTMENT_OTHER): Payer: 59

## 2014-03-07 VITALS — BP 132/76 | HR 61 | Temp 97.8°F

## 2014-03-07 DIAGNOSIS — C20 Malignant neoplasm of rectum: Secondary | ICD-10-CM

## 2014-03-07 DIAGNOSIS — C792 Secondary malignant neoplasm of skin: Secondary | ICD-10-CM

## 2014-03-07 DIAGNOSIS — C7970 Secondary malignant neoplasm of unspecified adrenal gland: Secondary | ICD-10-CM

## 2014-03-07 MED ORDER — HEPARIN SOD (PORK) LOCK FLUSH 100 UNIT/ML IV SOLN
500.0000 [IU] | Freq: Once | INTRAVENOUS | Status: AC | PRN
  Administered 2014-03-07: 500 [IU]
  Filled 2014-03-07: qty 5

## 2014-03-07 MED ORDER — SODIUM CHLORIDE 0.9 % IJ SOLN
10.0000 mL | INTRAMUSCULAR | Status: DC | PRN
  Administered 2014-03-07: 10 mL
  Filled 2014-03-07: qty 10

## 2014-03-16 ENCOUNTER — Ambulatory Visit (HOSPITAL_COMMUNITY)
Admission: RE | Admit: 2014-03-16 | Discharge: 2014-03-16 | Disposition: A | Discharge status: Home / Self Care | Payer: 59 | Source: Ambulatory Visit | Attending: Nurse Practitioner | Admitting: Nurse Practitioner

## 2014-03-16 ENCOUNTER — Encounter (HOSPITAL_COMMUNITY): Payer: Self-pay

## 2014-03-16 DIAGNOSIS — R599 Enlarged lymph nodes, unspecified: Secondary | ICD-10-CM | POA: Insufficient documentation

## 2014-03-16 DIAGNOSIS — C20 Malignant neoplasm of rectum: Secondary | ICD-10-CM | POA: Insufficient documentation

## 2014-03-16 DIAGNOSIS — Z933 Colostomy status: Secondary | ICD-10-CM | POA: Insufficient documentation

## 2014-03-16 DIAGNOSIS — I2510 Atherosclerotic heart disease of native coronary artery without angina pectoris: Secondary | ICD-10-CM | POA: Insufficient documentation

## 2014-03-16 MED ORDER — IOHEXOL 300 MG/ML  SOLN
100.0000 mL | Freq: Once | INTRAMUSCULAR | Status: AC | PRN
  Administered 2014-03-16: 100 mL via INTRAVENOUS

## 2014-03-18 ENCOUNTER — Other Ambulatory Visit: Payer: Self-pay | Admitting: Oncology

## 2014-03-19 ENCOUNTER — Ambulatory Visit: Payer: 59 | Admitting: Nutrition

## 2014-03-19 ENCOUNTER — Other Ambulatory Visit (HOSPITAL_BASED_OUTPATIENT_CLINIC_OR_DEPARTMENT_OTHER): Payer: 59

## 2014-03-19 ENCOUNTER — Ambulatory Visit (HOSPITAL_BASED_OUTPATIENT_CLINIC_OR_DEPARTMENT_OTHER): Payer: 59 | Admitting: Oncology

## 2014-03-19 ENCOUNTER — Ambulatory Visit (HOSPITAL_BASED_OUTPATIENT_CLINIC_OR_DEPARTMENT_OTHER): Payer: 59

## 2014-03-19 ENCOUNTER — Telehealth: Payer: Self-pay | Admitting: Oncology

## 2014-03-19 VITALS — BP 143/78 | HR 53 | Temp 98.2°F | Resp 19 | Ht 72.0 in | Wt 215.9 lb

## 2014-03-19 DIAGNOSIS — T451X5A Adverse effect of antineoplastic and immunosuppressive drugs, initial encounter: Secondary | ICD-10-CM

## 2014-03-19 DIAGNOSIS — C7970 Secondary malignant neoplasm of unspecified adrenal gland: Secondary | ICD-10-CM

## 2014-03-19 DIAGNOSIS — R911 Solitary pulmonary nodule: Secondary | ICD-10-CM

## 2014-03-19 DIAGNOSIS — Z5111 Encounter for antineoplastic chemotherapy: Secondary | ICD-10-CM

## 2014-03-19 DIAGNOSIS — C20 Malignant neoplasm of rectum: Secondary | ICD-10-CM

## 2014-03-19 DIAGNOSIS — D6959 Other secondary thrombocytopenia: Secondary | ICD-10-CM

## 2014-03-19 DIAGNOSIS — R599 Enlarged lymph nodes, unspecified: Secondary | ICD-10-CM

## 2014-03-19 DIAGNOSIS — D509 Iron deficiency anemia, unspecified: Secondary | ICD-10-CM

## 2014-03-19 DIAGNOSIS — G622 Polyneuropathy due to other toxic agents: Secondary | ICD-10-CM

## 2014-03-19 DIAGNOSIS — F411 Generalized anxiety disorder: Secondary | ICD-10-CM

## 2014-03-19 LAB — CBC WITH DIFFERENTIAL/PLATELET
BASO%: 0.7 % (ref 0.0–2.0)
Basophils Absolute: 0 10*3/uL (ref 0.0–0.1)
EOS%: 3.7 % (ref 0.0–7.0)
Eosinophils Absolute: 0.2 10*3/uL (ref 0.0–0.5)
HCT: 38.4 % (ref 38.4–49.9)
HGB: 12.5 g/dL — ABNORMAL LOW (ref 13.0–17.1)
LYMPH#: 1.8 10*3/uL (ref 0.9–3.3)
LYMPH%: 27.4 % (ref 14.0–49.0)
MCH: 26 pg — ABNORMAL LOW (ref 27.2–33.4)
MCHC: 32.7 g/dL (ref 32.0–36.0)
MCV: 79.7 fL (ref 79.3–98.0)
MONO#: 0.7 10*3/uL (ref 0.1–0.9)
MONO%: 10.3 % (ref 0.0–14.0)
NEUT#: 3.7 10*3/uL (ref 1.5–6.5)
NEUT%: 57.9 % (ref 39.0–75.0)
Platelets: 110 10*3/uL — ABNORMAL LOW (ref 140–400)
RBC: 4.82 10*6/uL (ref 4.20–5.82)
RDW: 23.8 % — AB (ref 11.0–14.6)
WBC: 6.4 10*3/uL (ref 4.0–10.3)

## 2014-03-19 LAB — COMPREHENSIVE METABOLIC PANEL (CC13)
ALT: 16 U/L (ref 0–55)
ANION GAP: 10 meq/L (ref 3–11)
AST: 15 U/L (ref 5–34)
Albumin: 3 g/dL — ABNORMAL LOW (ref 3.5–5.0)
Alkaline Phosphatase: 82 U/L (ref 40–150)
BUN: 11.2 mg/dL (ref 7.0–26.0)
CALCIUM: 9 mg/dL (ref 8.4–10.4)
CHLORIDE: 113 meq/L — AB (ref 98–109)
CO2: 20 mEq/L — ABNORMAL LOW (ref 22–29)
Creatinine: 0.8 mg/dL (ref 0.7–1.3)
Glucose: 98 mg/dl (ref 70–140)
Potassium: 3.8 mEq/L (ref 3.5–5.1)
SODIUM: 142 meq/L (ref 136–145)
TOTAL PROTEIN: 6.8 g/dL (ref 6.4–8.3)
Total Bilirubin: 0.31 mg/dL (ref 0.20–1.20)

## 2014-03-19 MED ORDER — SODIUM CHLORIDE 0.9 % IV SOLN
2400.0000 mg/m2 | INTRAVENOUS | Status: DC
  Administered 2014-03-19: 5200 mg via INTRAVENOUS
  Filled 2014-03-19: qty 104

## 2014-03-19 MED ORDER — FLUOROURACIL CHEMO INJECTION 2.5 GM/50ML
400.0000 mg/m2 | Freq: Once | INTRAVENOUS | Status: AC
  Administered 2014-03-19: 850 mg via INTRAVENOUS
  Filled 2014-03-19: qty 17

## 2014-03-19 MED ORDER — ONDANSETRON 8 MG/NS 50 ML IVPB
INTRAVENOUS | Status: AC
  Filled 2014-03-19: qty 8

## 2014-03-19 MED ORDER — OXALIPLATIN CHEMO INJECTION 100 MG/20ML
85.0000 mg/m2 | Freq: Once | INTRAVENOUS | Status: AC
  Administered 2014-03-19: 185 mg via INTRAVENOUS
  Filled 2014-03-19: qty 37

## 2014-03-19 MED ORDER — DEXTROSE 5 % IV SOLN
Freq: Once | INTRAVENOUS | Status: AC
  Administered 2014-03-19: 10:00:00 via INTRAVENOUS

## 2014-03-19 MED ORDER — ONDANSETRON 8 MG/50ML IVPB (CHCC)
8.0000 mg | Freq: Once | INTRAVENOUS | Status: AC
  Administered 2014-03-19: 8 mg via INTRAVENOUS

## 2014-03-19 MED ORDER — DEXTROSE 5 % IV SOLN
401.0000 mg/m2 | Freq: Once | INTRAVENOUS | Status: AC
  Administered 2014-03-19: 850 mg via INTRAVENOUS
  Filled 2014-03-19: qty 42.5

## 2014-03-19 MED ORDER — DEXAMETHASONE SODIUM PHOSPHATE 10 MG/ML IJ SOLN
10.0000 mg | Freq: Once | INTRAMUSCULAR | Status: AC
  Administered 2014-03-19: 10 mg via INTRAVENOUS

## 2014-03-19 MED ORDER — DEXAMETHASONE SODIUM PHOSPHATE 10 MG/ML IJ SOLN
INTRAMUSCULAR | Status: AC
  Filled 2014-03-19: qty 1

## 2014-03-19 NOTE — Patient Instructions (Signed)
[facility] Discharge Instructions for Patients Receiving Chemotherapy  Today you received the following chemotherapy agents: Oxaliplatin, Leucovorin, 5FU   To help prevent nausea and vomiting after your treatment, we encourage you to take your nausea medication as prescribed.    If you develop nausea and vomiting that is not controlled by your nausea medication, call the clinic.   BELOW ARE SYMPTOMS THAT SHOULD BE REPORTED IMMEDIATELY:  *FEVER GREATER THAN 100.5 F  *CHILLS WITH OR WITHOUT FEVER  NAUSEA AND VOMITING THAT IS NOT CONTROLLED WITH YOUR NAUSEA MEDICATION  *UNUSUAL SHORTNESS OF BREATH  *UNUSUAL BRUISING OR BLEEDING  TENDERNESS IN MOUTH AND THROAT WITH OR WITHOUT PRESENCE OF ULCERS  *URINARY PROBLEMS  *BOWEL PROBLEMS  UNUSUAL RASH Items with * indicate a potential emergency and should be followed up as soon as possible.  Feel free to call the clinic you have any questions or concerns. The clinic phone number is [phone].    

## 2014-03-19 NOTE — Progress Notes (Signed)
[place] OFFICE PROGRESS NOTE   Diagnosis: Rectal cancer  INTERVAL HISTORY:   [place] Robbins completed another cycle of FOLFOX 03/05/2014. No nausea, mouth sores, or diarrhea. Cold sensitivity lasted for one week following chemotherapy. No peripheral numbness at present. He is working.  Objective:  Vital signs in last 24 hours:  Blood pressure 143/78, pulse 53, temperature 98.2 F (36.8 C), temperature source Oral, resp. rate 19, height 6' (1.829 m), weight 215 lb 14.4 oz (97.932 kg), SpO2 100.00%.    HEENT: No thrush or ulcers Lymphatics: No palpable inguinal nodes. Resp: Lungs clear bilaterally Cardio: Regular rate and rhythm GI: No hepatomegaly, left lower quadrant colostomy, right lower quadrant Ross Vascular: No leg edema Neuro: Very mild decrease in vibratory sense at the fingertips bilaterally    Portacath/PICC-without erythema  Lab Results:  Lab Results  Component Value Date   WBC 6.4 03/19/2014   HGB 12.5* 03/19/2014   HCT 38.4 03/19/2014   MCV 79.7 03/19/2014   PLT 110* 03/19/2014   NEUTROABS 3.7 03/19/2014     Imaging:  Ct Chest W Contrast  03/16/2014   CLINICAL DATA:  Metastatic rectal cancer with ongoing chemotherapy. Evaluate for treatment response.  EXAM: CT CHEST, ABDOMEN, AND PELVIS WITH CONTRAST  TECHNIQUE: Multidetector CT imaging of the chest, abdomen and pelvis was performed following the standard protocol during bolus administration of intravenous contrast.  CONTRAST:  186m OMNIPAQUE IOHEXOL 300 MG/ML  SOLN  COMPARISON:  Most recent prior imaging includes PET-CT dated 12/11/2013 and CT chest/abdomen/pelvis dated 11/01/2013  FINDINGS: CT CHEST FINDINGS  Mediastinum: Unremarkable appearance of the thyroid gland. A significant interval reduction in the size of mediastinal adenopathy compared to the prior PET-CT. The index prevascular nodal conglomerate previously measured at 4.8 x 3.5 cm is now identified as to adjacent lymph nodes measuring  2.7 x 1.1 cm. Mild retrocrural adenopathy which was previously not hypermetabolic is essentially unchanged at 8 mm in short axis. Essentially unremarkable thoracic esophagus.  Heart/Vascular: Conventional 3 vessel arch anatomy. Cardiac size is within normal limits. Atherosclerotic calcifications present within the coronary arteries. No pericardial effusion. No large central pulmonary embolus. Left subclavian approach single-lumen port catheter. Catheter tip terminates in the distal SVC.  Lungs/Pleura: Previously identified 9 mm left upper lobe pulmonary nodule is barely identifiable as a small 3 mm nodule (image 26, series 5). No new pulmonary nodule identified.  Bones/Soft Tissues: No acute fracture or aggressive appearing lytic or blastic osseous lesion.  CT ABDOMEN AND PELVIS FINDINGS  Abdomen: Unremarkable CT appearance of the stomach, duodenum, spleen and pancreas. The previously identified left adrenal nodule is no longer visible. The right adrenal gland remains unremarkable. Normal hepatic contour and morphology. No hepatic lesion identified. Gallbladder is unremarkable. No intra or extrahepatic biliary ductal dilatation. Widely patent portal veins. Unremarkable appearance of the bilateral kidneys. No focal solid lesion, hydronephrosis or nephrolithiasis.  Extensive post surgical changes including low anterior resection with left lower quadrant diverting end colostomy and cystectomy with right lower quadrant diverting ureterosigmoidostomy. The previously identified left internal iliac station lymph node has almost completely resolved decreasing in size from 1.9 x 1.9 cm to 0.5 x 0.7 cm. The previously identified hypermetabolic soft tissue anterior to the sacrum is barely identifiable on the present exam. Left common iliac nodes are also difficult to identify. Similar appearance of a mild strandy soft tissue along the left pelvic sidewall in the region of the diverted left ureter and ureteral sigmoid  anastomosis. No definite enhancing soft tissue  or nodularity. No free fluid.  Pelvis: Surgical changes of low anterior resection and cystectomy as described above. Stable prominent superficial inguinal adenopathy. The largest left superficial inguinal node measures 2.8 x 1.2 cm, essentially unchanged compared to 2.6 x 1.2 cm on the prior. The largest right inguinal node measures 2.2 x 1.2 cm, incrementally enlarged compared to 1.9 x 1.2 cm previously.  Bones/Soft Tissues: No acute fracture or aggressive appearing lytic or blastic osseous lesion. Multilevel degenerative disc disease.  Vascular: Atherosclerotic vascular disease without significant stenosis or aneurysmal dilatation.  IMPRESSION: 1. Marked interval response to therapy including significant improvement in mediastinal and hilar adenopathy, resolution of left adrenal nodule, resolving left upper lobe pulmonary nodule, and a nearly resolved left internal iliac station lymph node. Additionally, the previously identified hypermetabolic soft tissue anterior to the sacrum and small hypermetabolic nodes and the left common iliac nodal station are now to subtle to identify definitively. 2. No new metastatic disease identified. 3. Similar to incrementally enlarged superficial inguinal lymph nodes bilaterally and are likely reactive. 4. Stable surgical changes of low anterior resection, cystectomy, left lower quadrant end colostomy and right lower quadrant diverting ureterosigmoidostomy without evidence of interval complication. 5. Additional ancillary findings as above without significant interval change.   Electronically Signed   By: Jacqulynn Cadet M.D.   On: 03/16/2014 17:10   Ct Abdomen Pelvis W Contrast  03/16/2014   CLINICAL DATA:  Metastatic rectal cancer with ongoing chemotherapy. Evaluate for treatment response.  EXAM: CT CHEST, ABDOMEN, AND PELVIS WITH CONTRAST  TECHNIQUE: Multidetector CT imaging of the chest, abdomen and pelvis was performed  following the standard protocol during bolus administration of intravenous contrast.  CONTRAST:  138m OMNIPAQUE IOHEXOL 300 MG/ML  SOLN  COMPARISON:  Most recent prior imaging includes PET-CT dated 12/11/2013 and CT chest/abdomen/pelvis dated 11/01/2013  FINDINGS: CT CHEST FINDINGS  Mediastinum: Unremarkable appearance of the thyroid gland. A significant interval reduction in the size of mediastinal adenopathy compared to the prior PET-CT. The index prevascular nodal conglomerate previously measured at 4.8 x 3.5 cm is now identified as to adjacent lymph nodes measuring 2.7 x 1.1 cm. Mild retrocrural adenopathy which was previously not hypermetabolic is essentially unchanged at 8 mm in short axis. Essentially unremarkable thoracic esophagus.  Heart/Vascular: Conventional 3 vessel arch anatomy. Cardiac size is within normal limits. Atherosclerotic calcifications present within the coronary arteries. No pericardial effusion. No large central pulmonary embolus. Left subclavian approach single-lumen port catheter. Catheter tip terminates in the distal SVC.  Lungs/Pleura: Previously identified 9 mm left upper lobe pulmonary nodule is barely identifiable as a small 3 mm nodule (image 26, series 5). No new pulmonary nodule identified.  Bones/Soft Tissues: No acute fracture or aggressive appearing lytic or blastic osseous lesion.  CT ABDOMEN AND PELVIS FINDINGS  Abdomen: Unremarkable CT appearance of the stomach, duodenum, spleen and pancreas. The previously identified left adrenal nodule is no longer visible. The right adrenal gland remains unremarkable. Normal hepatic contour and morphology. No hepatic lesion identified. Gallbladder is unremarkable. No intra or extrahepatic biliary ductal dilatation. Widely patent portal veins. Unremarkable appearance of the bilateral kidneys. No focal solid lesion, hydronephrosis or nephrolithiasis.  Extensive post surgical changes including low anterior resection with left lower  quadrant diverting end colostomy and cystectomy with right lower quadrant diverting ureterosigmoidostomy. The previously identified left internal iliac station lymph node has almost completely resolved decreasing in size from 1.9 x 1.9 cm to 0.5 x 0.7 cm. The previously identified hypermetabolic soft tissue anterior to  the sacrum is barely identifiable on the present exam. Left common iliac nodes are also difficult to identify. Similar appearance of a mild strandy soft tissue along the left pelvic sidewall in the region of the diverted left ureter and ureteral sigmoid anastomosis. No definite enhancing soft tissue or nodularity. No free fluid.  Pelvis: Surgical changes of low anterior resection and cystectomy as described above. Stable prominent superficial inguinal adenopathy. The largest left superficial inguinal node measures 2.8 x 1.2 cm, essentially unchanged compared to 2.6 x 1.2 cm on the prior. The largest right inguinal node measures 2.2 x 1.2 cm, incrementally enlarged compared to 1.9 x 1.2 cm previously.  Bones/Soft Tissues: No acute fracture or aggressive appearing lytic or blastic osseous lesion. Multilevel degenerative disc disease.  Vascular: Atherosclerotic vascular disease without significant stenosis or aneurysmal dilatation.  IMPRESSION: 1. Marked interval response to therapy including significant improvement in mediastinal and hilar adenopathy, resolution of left adrenal nodule, resolving left upper lobe pulmonary nodule, and a nearly resolved left internal iliac station lymph node. Additionally, the previously identified hypermetabolic soft tissue anterior to the sacrum and small hypermetabolic nodes and the left common iliac nodal station are now to subtle to identify definitively. 2. No new metastatic disease identified. 3. Similar to incrementally enlarged superficial inguinal lymph nodes bilaterally and are likely reactive. 4. Stable surgical changes of low anterior resection, cystectomy,  left lower quadrant end colostomy and right lower quadrant diverting ureterosigmoidostomy without evidence of interval complication. 5. Additional ancillary findings as above without significant interval change.   Electronically Signed   By: Jacqulynn Cadet M.D.   On: 03/16/2014 17:10    Medications: I have reviewed the patient's current medications.  Assessment/Plan: 1. Clinical stage IV (B1Y,N8G,N5) adenocarcinoma of the rectum with tumor directly extending to the bladder/prostate and CT scan evidence of metastatic chest adenopathy Positive K-ras mutation.  Normal mismatch repair protein expression. Microsatellite stable Status post low anterior resection and end colostomy with a cystoprostatectomy and colon conduit urinary diversion 11/01/2013.  Staging PET scan with hypermetabolic pelvic nodes, mediastinal nodes, left adrenal metastasis, and left upper lobe nodule.  Initiation of FOLFOX 12/25/2013. Restaging CT 03/16/2014 after 6 cycles of FOLFOX confirmed improvement in chest/pelvic lymphadenopathy, a left upper lobe nodule, and resolution of a left adrenal nodule 2. Microcytic anemia-likely iron deficiency, improved. He continues oral iron. 3. Gout. 4. Port-A-Cath placement 12/18/2013. 5. Mild thrombocytopenia related to chemotherapy. 6. Anxiety. He takes Xanax as needed. 7. History of Left knee pain and swelling. He was treated with a Medrol Dosepak 03/05/2014 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   Disposition:  Austin Robbins has completed 6 cycles of FOLFOX. He has tolerated chemotherapy well and the restaging CT 03/16/2012 confirms a clinical response to therapy. The plan is to continue FOLFOX for at least 2-3 more cycles. We will then consider a switch to "maintenance" 5-fluorouracil.  [name], MD  03/19/2014  8:47 AM

## 2014-03-19 NOTE — Telephone Encounter (Signed)
gv and pritned appt sched and avs for pt for July adn Aug....Marland Kitchensed added tx.

## 2014-03-19 NOTE — Progress Notes (Signed)
Brief followup completed with patient and wife, during chemotherapy.  Patient continues to eat well and weight increased to 215.9 pounds on Monday, July 13.  Patient continues to deny nutrition side effects.  There is no nutrition related diagnosis.  Patient has my contact information for questions or concerns.  Will followup with patient on Monday, August 10, during chemotherapy.    **Disclaimer: This note was dictated with voice recognition software. Similar sounding words can inadvertently be transcribed and this note may contain transcription errors which may not have been corrected upon publication of note.**

## 2014-03-19 NOTE — Progress Notes (Signed)
With 5 minutes remaining in oxaliplatin/leucovorin infusion, patient stated that he was having intermittent back pain. He states this has happened with past infusions but he never told any of the nurses or Dr. Benay Spice. Information passed along to [place], Therapist, sports. Patient stated back pain was almost resolved upon discharge. Patient told to call if pain worsens or persists. Patient and wife agreeable to this. Cindi Carbon, RN

## 2014-03-21 ENCOUNTER — Ambulatory Visit (HOSPITAL_BASED_OUTPATIENT_CLINIC_OR_DEPARTMENT_OTHER): Payer: 59

## 2014-03-21 VITALS — BP 113/68 | HR 51 | Temp 97.9°F

## 2014-03-21 DIAGNOSIS — C20 Malignant neoplasm of rectum: Secondary | ICD-10-CM

## 2014-03-21 DIAGNOSIS — C7970 Secondary malignant neoplasm of unspecified adrenal gland: Secondary | ICD-10-CM

## 2014-03-21 MED ORDER — SODIUM CHLORIDE 0.9 % IJ SOLN
10.0000 mL | INTRAMUSCULAR | Status: DC | PRN
  Administered 2014-03-21: 10 mL
  Filled 2014-03-21: qty 10

## 2014-03-21 MED ORDER — HEPARIN SOD (PORK) LOCK FLUSH 100 UNIT/ML IV SOLN
500.0000 [IU] | Freq: Once | INTRAVENOUS | Status: AC | PRN
  Administered 2014-03-21: 500 [IU]
  Filled 2014-03-21: qty 5

## 2014-04-01 ENCOUNTER — Other Ambulatory Visit: Payer: Self-pay | Admitting: Oncology

## 2014-04-02 ENCOUNTER — Other Ambulatory Visit (HOSPITAL_BASED_OUTPATIENT_CLINIC_OR_DEPARTMENT_OTHER): Payer: 59

## 2014-04-02 ENCOUNTER — Telehealth: Payer: Self-pay | Admitting: *Deleted

## 2014-04-02 ENCOUNTER — Ambulatory Visit (HOSPITAL_BASED_OUTPATIENT_CLINIC_OR_DEPARTMENT_OTHER): Payer: 59 | Admitting: Oncology

## 2014-04-02 ENCOUNTER — Telehealth: Payer: Self-pay | Admitting: Nurse Practitioner

## 2014-04-02 ENCOUNTER — Ambulatory Visit (HOSPITAL_BASED_OUTPATIENT_CLINIC_OR_DEPARTMENT_OTHER): Payer: 59

## 2014-04-02 VITALS — BP 132/80 | HR 51 | Temp 97.8°F | Resp 18 | Ht 72.0 in | Wt 218.0 lb

## 2014-04-02 DIAGNOSIS — C20 Malignant neoplasm of rectum: Secondary | ICD-10-CM

## 2014-04-02 DIAGNOSIS — C7919 Secondary malignant neoplasm of other urinary organs: Secondary | ICD-10-CM

## 2014-04-02 DIAGNOSIS — C7970 Secondary malignant neoplasm of unspecified adrenal gland: Secondary | ICD-10-CM

## 2014-04-02 DIAGNOSIS — D696 Thrombocytopenia, unspecified: Secondary | ICD-10-CM

## 2014-04-02 DIAGNOSIS — M545 Low back pain, unspecified: Secondary | ICD-10-CM

## 2014-04-02 DIAGNOSIS — Z5111 Encounter for antineoplastic chemotherapy: Secondary | ICD-10-CM

## 2014-04-02 LAB — CBC WITH DIFFERENTIAL/PLATELET
BASO%: 0.7 % (ref 0.0–2.0)
BASOS ABS: 0 10*3/uL (ref 0.0–0.1)
EOS%: 3.9 % (ref 0.0–7.0)
Eosinophils Absolute: 0.2 10*3/uL (ref 0.0–0.5)
HCT: 40.8 % (ref 38.4–49.9)
HEMOGLOBIN: 13.3 g/dL (ref 13.0–17.1)
LYMPH#: 1.5 10*3/uL (ref 0.9–3.3)
LYMPH%: 32.9 % (ref 14.0–49.0)
MCH: 26.8 pg — ABNORMAL LOW (ref 27.2–33.4)
MCHC: 32.6 g/dL (ref 32.0–36.0)
MCV: 82.3 fL (ref 79.3–98.0)
MONO#: 0.5 10*3/uL (ref 0.1–0.9)
MONO%: 10.8 % (ref 0.0–14.0)
NEUT#: 2.3 10*3/uL (ref 1.5–6.5)
NEUT%: 51.7 % (ref 39.0–75.0)
PLATELETS: 75 10*3/uL — AB (ref 140–400)
RBC: 4.96 10*6/uL (ref 4.20–5.82)
RDW: 26 % — ABNORMAL HIGH (ref 11.0–14.6)
WBC: 4.4 10*3/uL (ref 4.0–10.3)

## 2014-04-02 LAB — COMPREHENSIVE METABOLIC PANEL (CC13)
ALT: 17 U/L (ref 0–55)
ANION GAP: 8 meq/L (ref 3–11)
AST: 18 U/L (ref 5–34)
Albumin: 3.3 g/dL — ABNORMAL LOW (ref 3.5–5.0)
Alkaline Phosphatase: 88 U/L (ref 40–150)
BUN: 10.2 mg/dL (ref 7.0–26.0)
CHLORIDE: 111 meq/L — AB (ref 98–109)
CO2: 20 mEq/L — ABNORMAL LOW (ref 22–29)
CREATININE: 0.8 mg/dL (ref 0.7–1.3)
Calcium: 8.9 mg/dL (ref 8.4–10.4)
Glucose: 113 mg/dl (ref 70–140)
Potassium: 3.8 mEq/L (ref 3.5–5.1)
Sodium: 140 mEq/L (ref 136–145)
Total Bilirubin: 0.52 mg/dL (ref 0.20–1.20)
Total Protein: 6.9 g/dL (ref 6.4–8.3)

## 2014-04-02 MED ORDER — SODIUM CHLORIDE 0.9 % IJ SOLN
10.0000 mL | INTRAMUSCULAR | Status: DC | PRN
  Filled 2014-04-02: qty 10

## 2014-04-02 MED ORDER — FLUOROURACIL CHEMO INJECTION 2.5 GM/50ML
400.0000 mg/m2 | Freq: Once | INTRAVENOUS | Status: AC
  Administered 2014-04-02: 850 mg via INTRAVENOUS
  Filled 2014-04-02: qty 17

## 2014-04-02 MED ORDER — SODIUM CHLORIDE 0.9 % IV SOLN
2400.0000 mg/m2 | INTRAVENOUS | Status: DC
  Administered 2014-04-02: 5200 mg via INTRAVENOUS
  Filled 2014-04-02: qty 104

## 2014-04-02 MED ORDER — OXYCODONE-ACETAMINOPHEN 5-325 MG PO TABS: 1.0000 | ORAL_TABLET | ORAL | Status: DC | PRN

## 2014-04-02 MED ORDER — LEUCOVORIN CALCIUM INJECTION 350 MG
401.0000 mg/m2 | Freq: Once | INTRAVENOUS | Status: AC
  Administered 2014-04-02: 850 mg via INTRAVENOUS
  Filled 2014-04-02: qty 42.5

## 2014-04-02 MED ORDER — HEPARIN SOD (PORK) LOCK FLUSH 100 UNIT/ML IV SOLN
500.0000 [IU] | Freq: Once | INTRAVENOUS | Status: DC | PRN
  Filled 2014-04-02: qty 5

## 2014-04-02 MED ORDER — SODIUM CHLORIDE 0.9 % IV SOLN
Freq: Once | INTRAVENOUS | Status: AC
  Administered 2014-04-02: 10:00:00 via INTRAVENOUS

## 2014-04-02 NOTE — Patient Instructions (Signed)
[facility] Discharge Instructions for Patients Receiving Chemotherapy  Today you received the following chemotherapy agents 5-FU/Leucovorin.   To help prevent nausea and vomiting after your treatment, we encourage you to take your nausea medication as directed.    If you develop nausea and vomiting that is not controlled by your nausea medication, call the clinic.   BELOW ARE SYMPTOMS THAT SHOULD BE REPORTED IMMEDIATELY:  *FEVER GREATER THAN 100.5 F  *CHILLS WITH OR WITHOUT FEVER  NAUSEA AND VOMITING THAT IS NOT CONTROLLED WITH YOUR NAUSEA MEDICATION  *UNUSUAL SHORTNESS OF BREATH  *UNUSUAL BRUISING OR BLEEDING  TENDERNESS IN MOUTH AND THROAT WITH OR WITHOUT PRESENCE OF ULCERS  *URINARY PROBLEMS  *BOWEL PROBLEMS  UNUSUAL RASH Items with * indicate a potential emergency and should be followed up as soon as possible.  Feel free to call the clinic you have any questions or concerns. The clinic phone number is [phone].    

## 2014-04-02 NOTE — Telephone Encounter (Signed)
Pt confirmed labs/ov per 07/27, gave pt AVS....KJ

## 2014-04-02 NOTE — Progress Notes (Signed)
No oxaliplatin due to platelet count per MD

## 2014-04-02 NOTE — Telephone Encounter (Signed)
Per [facility] staff message scheduled appts. Advised scheduler 

## 2014-04-02 NOTE — Progress Notes (Signed)
  Austin Robbins returns as scheduled. He complains of intermittent sharp pain at the right lower back that began on day 3 of the last cycle of chemotherapy. His feet feel "abnormal "but he denies numbness. He has a ulcer at the angle of the right lips. Cold sensitivity persists. He reports discomfort involving 2  "white "areas at the urostomy site  Objective:  Vital signs in last 24 hours:  Blood pressure 132/80, pulse 51, temperature 97.8 F (36.6 C), temperature source Oral, resp. rate 18, height 6' (1.829 m), weight 218 lb (98.884 kg), SpO2 100.00%.    HEENT: No thrush or ulcers, healing superficial ulceration at the angle of the right lips Resp: Lungs clear bilaterally Cardio: Regular rate and rhythm GI: No hepatomegaly, right lower quadrant urostomy, left lower quadrant Vascular: No leg edema Neuro: Mild to moderate decrease in vibratory sense at the fingertips bilaterally  Skin: No rash Musculoskeletal: No right flank tenderness   Portacath/PICC-without erythema  Lab Results:  Lab Results  Component Value Date   WBC 4.4 04/02/2014   HGB 13.3 04/02/2014   HCT 40.8 04/02/2014   MCV 82.3 04/02/2014   PLT 75* 04/02/2014   NEUTROABS 2.3 04/02/2014   Medications: I have reviewed the patient's current medications.  Assessment/Plan: 1. Clinical stage IV (U4Q,I3K,V4) adenocarcinoma of the rectum with tumor directly extending to the bladder/prostate and CT scan evidence of metastatic chest adenopathy Positive K-ras mutation.  Normal mismatch repair protein expression. Microsatellite stable  Status post low anterior resection and end colostomy with a cystoprostatectomy and colon conduit urinary diversion 11/01/2013.  Staging PET scan with hypermetabolic pelvic nodes, mediastinal nodes, left adrenal metastasis, and left upper lobe nodule.  Initiation of FOLFOX 12/25/2013.  Restaging CT  03/16/2014 after 6 cycles of FOLFOX confirmed improvement in chest/pelvic lymphadenopathy, a left upper lobe nodule, and resolution of a left adrenal nodule Oxaliplatin deleted from cycle 8 FOLFOX secondary to neuropathy and thrombocytopenia 2. Microcytic anemia-likely iron deficiency, improved. He continues oral iron. 3. Gout. 4. Port-A-Cath placement 12/18/2013. 5.   thrombocytopenia related to chemotherapy. 5. Anxiety. He takes Xanax as needed. 6. History of Left knee pain and swelling. He was treated with a Medrol Dosepak 03/05/2014 7. Oxaliplatin neuropathy    Disposition:  His overall status appears unchanged. I suspect the right lower back/flank pain is related to a benign musculoskeletal condition. Austin Robbins has progressive thrombocytopenia. He also has oxaliplatin neuropathy. We decided to hold oxaliplatin today. He will proceed with cycle 8 chemotherapy to include 5-FU/leucovorin. Austin Robbins will return for an office visit and chemotherapy in 2 weeks.  I recommended he followup with urology to evaluate the discomfort at the urostomy site.    [place] Coder, MD  04/02/2014  9:45 AM

## 2014-04-04 ENCOUNTER — Ambulatory Visit (HOSPITAL_BASED_OUTPATIENT_CLINIC_OR_DEPARTMENT_OTHER): Payer: 59

## 2014-04-04 VITALS — BP 129/74 | HR 65 | Temp 98.0°F

## 2014-04-04 DIAGNOSIS — C20 Malignant neoplasm of rectum: Secondary | ICD-10-CM

## 2014-04-04 MED ORDER — HEPARIN SOD (PORK) LOCK FLUSH 100 UNIT/ML IV SOLN
500.0000 [IU] | Freq: Once | INTRAVENOUS | Status: AC | PRN
  Administered 2014-04-04: 500 [IU]
  Filled 2014-04-04: qty 5

## 2014-04-04 MED ORDER — SODIUM CHLORIDE 0.9 % IJ SOLN
10.0000 mL | INTRAMUSCULAR | Status: DC | PRN
  Administered 2014-04-04: 10 mL
  Filled 2014-04-04: qty 10

## 2014-04-11 ENCOUNTER — Telehealth: Payer: Self-pay | Admitting: *Deleted

## 2014-04-11 NOTE — Telephone Encounter (Signed)
Message from pt's wife reporting he has a "pimple-like" sore on his urostomy stoma. Asking who he should see to have this evaluated. Reviewed with Dr. Benay Spice: recommended he see urologist. Called wife with this info, she voiced understanding.

## 2014-04-15 ENCOUNTER — Other Ambulatory Visit: Payer: Self-pay | Admitting: Oncology

## 2014-04-16 ENCOUNTER — Other Ambulatory Visit: Payer: Self-pay

## 2014-04-16 ENCOUNTER — Ambulatory Visit: Payer: 59 | Admitting: Nutrition

## 2014-04-16 ENCOUNTER — Telehealth: Payer: Self-pay | Admitting: *Deleted

## 2014-04-16 ENCOUNTER — Ambulatory Visit (HOSPITAL_BASED_OUTPATIENT_CLINIC_OR_DEPARTMENT_OTHER): Payer: 59

## 2014-04-16 ENCOUNTER — Other Ambulatory Visit (HOSPITAL_BASED_OUTPATIENT_CLINIC_OR_DEPARTMENT_OTHER): Payer: 59

## 2014-04-16 ENCOUNTER — Ambulatory Visit (HOSPITAL_BASED_OUTPATIENT_CLINIC_OR_DEPARTMENT_OTHER): Payer: 59 | Admitting: Nurse Practitioner

## 2014-04-16 ENCOUNTER — Telehealth: Payer: Self-pay | Admitting: Nurse Practitioner

## 2014-04-16 VITALS — BP 132/75 | HR 60 | Temp 98.3°F | Resp 18 | Ht 72.0 in | Wt 222.2 lb

## 2014-04-16 DIAGNOSIS — C20 Malignant neoplasm of rectum: Secondary | ICD-10-CM

## 2014-04-16 DIAGNOSIS — G620 Drug-induced polyneuropathy: Secondary | ICD-10-CM

## 2014-04-16 DIAGNOSIS — Z5111 Encounter for antineoplastic chemotherapy: Secondary | ICD-10-CM

## 2014-04-16 DIAGNOSIS — C7970 Secondary malignant neoplasm of unspecified adrenal gland: Secondary | ICD-10-CM

## 2014-04-16 DIAGNOSIS — R599 Enlarged lymph nodes, unspecified: Secondary | ICD-10-CM

## 2014-04-16 DIAGNOSIS — D509 Iron deficiency anemia, unspecified: Secondary | ICD-10-CM

## 2014-04-16 DIAGNOSIS — R911 Solitary pulmonary nodule: Secondary | ICD-10-CM

## 2014-04-16 DIAGNOSIS — C778 Secondary and unspecified malignant neoplasm of lymph nodes of multiple regions: Secondary | ICD-10-CM

## 2014-04-16 LAB — CBC WITH DIFFERENTIAL/PLATELET
BASO%: 1 % (ref 0.0–2.0)
Basophils Absolute: 0.1 10*3/uL (ref 0.0–0.1)
EOS%: 2.6 % (ref 0.0–7.0)
Eosinophils Absolute: 0.2 10*3/uL (ref 0.0–0.5)
HCT: 39.3 % (ref 38.4–49.9)
HGB: 13.2 g/dL (ref 13.0–17.1)
LYMPH%: 17.7 % (ref 14.0–49.0)
MCH: 28.9 pg (ref 27.2–33.4)
MCHC: 33.5 g/dL (ref 32.0–36.0)
MCV: 86.1 fL (ref 79.3–98.0)
MONO#: 0.9 10*3/uL (ref 0.1–0.9)
MONO%: 9.8 % (ref 0.0–14.0)
NEUT#: 6.5 10*3/uL (ref 1.5–6.5)
NEUT%: 68.9 % (ref 39.0–75.0)
Platelets: 128 10*3/uL — ABNORMAL LOW (ref 140–400)
RBC: 4.56 10*6/uL (ref 4.20–5.82)
RDW: 26.4 % — AB (ref 11.0–14.6)
WBC: 9.4 10*3/uL (ref 4.0–10.3)
lymph#: 1.7 10*3/uL (ref 0.9–3.3)

## 2014-04-16 LAB — COMPREHENSIVE METABOLIC PANEL
ALK PHOS: 84 U/L (ref 39–117)
ALT: 14 U/L (ref 0–53)
AST: 18 U/L (ref 0–37)
Albumin: 3.3 g/dL — ABNORMAL LOW (ref 3.5–5.2)
BUN: 12 mg/dL (ref 6–23)
CO2: 21 mEq/L (ref 19–32)
Calcium: 9.3 mg/dL (ref 8.4–10.5)
Chloride: 107 mEq/L (ref 96–112)
Creatinine, Ser: 0.89 mg/dL (ref 0.50–1.35)
Glucose, Bld: 127 mg/dL — ABNORMAL HIGH (ref 70–99)
Potassium: 4.1 mEq/L (ref 3.5–5.3)
SODIUM: 143 meq/L (ref 135–145)
TOTAL PROTEIN: 6.7 g/dL (ref 6.0–8.3)
Total Bilirubin: 0.3 mg/dL (ref 0.2–1.2)

## 2014-04-16 MED ORDER — DEXTROSE 5 % IV SOLN: Freq: Once | INTRAVENOUS | Status: DC

## 2014-04-16 MED ORDER — SODIUM CHLORIDE 0.9 % IV SOLN
2400.0000 mg/m2 | INTRAVENOUS | Status: DC
  Administered 2014-04-16: 5200 mg via INTRAVENOUS
  Filled 2014-04-16: qty 104

## 2014-04-16 MED ORDER — FLUOROURACIL CHEMO INJECTION 2.5 GM/50ML
400.0000 mg/m2 | Freq: Once | INTRAVENOUS | Status: AC
  Administered 2014-04-16: 850 mg via INTRAVENOUS
  Filled 2014-04-16: qty 17

## 2014-04-16 MED ORDER — ALPRAZOLAM 1 MG PO TABS: ORAL_TABLET | ORAL | Status: DC

## 2014-04-16 MED ORDER — LEUCOVORIN CALCIUM INJECTION 350 MG
401.0000 mg/m2 | Freq: Once | INTRAVENOUS | Status: AC
  Administered 2014-04-16: 850 mg via INTRAVENOUS
  Filled 2014-04-16: qty 42.5

## 2014-04-16 NOTE — Progress Notes (Signed)
Nutrition followup with patient in the chemotherapy room.  Patient reports he had a "gout flareup".  Noted bilateral pitting edema with weight increased to 222.2 pounds on August 10.  Patient denies nutrition side effects at this time.  Reports appetite and oral intake are good.  Nutrition diagnosis: Food and nutrition related knowledge deficit related to gout as evidenced by no need for prior nutrition information.  Intervention: Educated patient on low purine diet and provided a copy of gout diet education.  Questions answered and teach back method used.  Monitoring, evaluation, goals: Patient will continue adequate calories and protein to support maintenance of lean body weight during treatment.  Next visit: Tuesday, September 8 chemotherapy room.  **Disclaimer: This note was dictated with voice recognition software. Similar sounding words can inadvertently be transcribed and this note may contain transcription errors which may not have been corrected upon publication of note.**

## 2014-04-16 NOTE — Progress Notes (Signed)
  [place] OFFICE PROGRESS NOTE   Diagnosis: Colon cancer.  INTERVAL HISTORY:   Austin Robbins returns as scheduled. He completed cycle 8 FOLFOX 04/02/2014. The oxaliplatin was held due to persistent cold sensitivity. He continued to have cold sensitivity until about 3 days ago. No numbness or tingling in the absence of cold exposure. No nausea or vomiting. No mouth sores. He had a few episodes of loose stools. He continues to have intermittent back pain. No leg weakness or numbness. Colostomy and urostomy functioning normally. He and his wife notes that he is intermittently tearful. He takes Xanax as needed which he feels helps. He denies suicidal ideation.  Objective:  Vital signs in last 24 hours:  Blood pressure 132/75, pulse 60, temperature 98.3 F (36.8 C), temperature source Oral, resp. rate 18, height 6' (1.829 m), weight 222 lb 3.2 oz (100.789 kg), SpO2 99.00%.    HEENT: No thrush or ulcerations. Healing superficial ulceration at the angle of the right lips. Resp: Lungs clear bilaterally. Cardio: Regular rate and rhythm. GI: Abdomen soft and nontender. Right lower quadrant urostomy, left lower quadrant colostomy. Vascular: Pitting edema at the lower legs bilaterally. Neuro: Vibratory sense moderately decreased over the fingertips per tuning fork exam.  Skin: No rash.  Port-A-Cath site without erythema.    Lab Results:  Lab Results  Component Value Date   WBC 9.4 04/16/2014   HGB 13.2 04/16/2014   HCT 39.3 04/16/2014   MCV 86.1 04/16/2014   PLT 128* 04/16/2014   NEUTROABS 6.5 04/16/2014    Imaging:  No results found.  Medications: I have reviewed the patient's current medications.  Assessment/Plan: 1. Clinical stage IV (P5W,S5K,C1) adenocarcinoma of the rectum with tumor directly extending to the bladder/prostate and CT scan evidence of metastatic chest adenopathy Positive K-ras mutation.  Normal mismatch repair protein expression. Microsatellite  stable  Status post low anterior resection and end colostomy with a cystoprostatectomy and colon conduit urinary diversion 11/01/2013.  Staging PET scan with hypermetabolic pelvic nodes, mediastinal nodes, left adrenal metastasis, and left upper lobe nodule.  Initiation of FOLFOX 12/25/2013.  Restaging CT 03/16/2014 after 6 cycles of FOLFOX confirmed improvement in chest/pelvic lymphadenopathy, a left upper lobe nodule, and resolution of a left adrenal nodule  Oxaliplatin deleted from cycle 8 FOLFOX secondary to neuropathy and thrombocytopenia 2. Microcytic anemia-likely iron deficiency, improved. He continues oral iron. 3. Gout. 4. Port-A-Cath placement 12/18/2013.  5. Anxiety. He takes Xanax as needed. 6. History of left knee pain and swelling. He was treated with a Medrol Dosepak 03/05/2014 7. Oxaliplatin neuropathy. 8. Thrombocytopenia secondary to chemotherapy.   Disposition: [name] appears stable. He has completed 8 cycles of FOLFOX. Oxaliplatin was held with cycle 8 due to neuropathy. The cold sensitivity just resolved 3 days ago. Plan to proceed with cycle 9 FOLFOX today as scheduled but will continue to hold the oxaliplatin and consider resuming with cycle 10 at a reduced dose.  At today's visit he reports that he is intermittently tearful. We discussed depression. He declines an antidepressant at present. We also discussed meeting with a counselor. He will consider this and let us know his decision.  He will return for a followup visit and cycle 10 FOLFOX in 2 weeks. He will contact the office in the interim with any problems.    [name] ANP/GNP-BC   04/16/2014  10:11 AM

## 2014-04-16 NOTE — Patient Instructions (Signed)
[facility] Discharge Instructions for Patients Receiving Chemotherapy  Today you received the following chemotherapy agents leucovorin/fluorouracil.    To help prevent nausea and vomiting after your treatment, we encourage you to take your nausea medication as directed.     If you develop nausea and vomiting that is not controlled by your nausea medication, call the clinic.   BELOW ARE SYMPTOMS THAT SHOULD BE REPORTED IMMEDIATELY:  *FEVER GREATER THAN 100.5 F  *CHILLS WITH OR WITHOUT FEVER  NAUSEA AND VOMITING THAT IS NOT CONTROLLED WITH YOUR NAUSEA MEDICATION  *UNUSUAL SHORTNESS OF BREATH  *UNUSUAL BRUISING OR BLEEDING  TENDERNESS IN MOUTH AND THROAT WITH OR WITHOUT PRESENCE OF ULCERS  *URINARY PROBLEMS  *BOWEL PROBLEMS  UNUSUAL RASH Items with * indicate a potential emergency and should be followed up as soon as possible.  Feel free to call the clinic you have any questions or concerns. The clinic phone number is [phone].  

## 2014-04-16 NOTE — Telephone Encounter (Signed)
Per staff message and POF I have scheduled appts. Advised scheduler of appts. JMW  

## 2014-04-18 ENCOUNTER — Ambulatory Visit (HOSPITAL_BASED_OUTPATIENT_CLINIC_OR_DEPARTMENT_OTHER): Payer: 59

## 2014-04-18 VITALS — BP 127/67 | HR 74 | Temp 99.1°F

## 2014-04-18 DIAGNOSIS — C20 Malignant neoplasm of rectum: Secondary | ICD-10-CM

## 2014-04-18 MED ORDER — SODIUM CHLORIDE 0.9 % IJ SOLN
10.0000 mL | INTRAMUSCULAR | Status: DC | PRN
  Administered 2014-04-18: 10 mL
  Filled 2014-04-18: qty 10

## 2014-04-18 MED ORDER — HEPARIN SOD (PORK) LOCK FLUSH 100 UNIT/ML IV SOLN
500.0000 [IU] | Freq: Once | INTRAVENOUS | Status: AC | PRN
  Administered 2014-04-18: 500 [IU]
  Filled 2014-04-18: qty 5

## 2014-04-29 ENCOUNTER — Other Ambulatory Visit: Payer: Self-pay | Admitting: Oncology

## 2014-04-30 ENCOUNTER — Telehealth: Payer: Self-pay | Admitting: Oncology

## 2014-04-30 ENCOUNTER — Other Ambulatory Visit (HOSPITAL_BASED_OUTPATIENT_CLINIC_OR_DEPARTMENT_OTHER): Payer: 59

## 2014-04-30 ENCOUNTER — Ambulatory Visit (HOSPITAL_BASED_OUTPATIENT_CLINIC_OR_DEPARTMENT_OTHER): Payer: 59 | Admitting: Nurse Practitioner

## 2014-04-30 ENCOUNTER — Ambulatory Visit (HOSPITAL_BASED_OUTPATIENT_CLINIC_OR_DEPARTMENT_OTHER): Payer: 59

## 2014-04-30 ENCOUNTER — Other Ambulatory Visit: Payer: Self-pay | Admitting: Oncology

## 2014-04-30 VITALS — BP 131/83 | HR 58 | Temp 97.5°F | Resp 18 | Ht 72.0 in | Wt 222.2 lb

## 2014-04-30 DIAGNOSIS — C7970 Secondary malignant neoplasm of unspecified adrenal gland: Secondary | ICD-10-CM

## 2014-04-30 DIAGNOSIS — R21 Rash and other nonspecific skin eruption: Secondary | ICD-10-CM

## 2014-04-30 DIAGNOSIS — C20 Malignant neoplasm of rectum: Secondary | ICD-10-CM

## 2014-04-30 DIAGNOSIS — Z5111 Encounter for antineoplastic chemotherapy: Secondary | ICD-10-CM

## 2014-04-30 DIAGNOSIS — R609 Edema, unspecified: Secondary | ICD-10-CM

## 2014-04-30 LAB — COMPREHENSIVE METABOLIC PANEL (CC13)
ALK PHOS: 77 U/L (ref 40–150)
ALT: 8 U/L (ref 0–55)
ANION GAP: 7 meq/L (ref 3–11)
AST: 14 U/L (ref 5–34)
Albumin: 3.4 g/dL — ABNORMAL LOW (ref 3.5–5.0)
BILIRUBIN TOTAL: 0.44 mg/dL (ref 0.20–1.20)
BUN: 13.5 mg/dL (ref 7.0–26.0)
CO2: 19 meq/L — AB (ref 22–29)
CREATININE: 0.8 mg/dL (ref 0.7–1.3)
Calcium: 8.5 mg/dL (ref 8.4–10.4)
Chloride: 113 mEq/L — ABNORMAL HIGH (ref 98–109)
Glucose: 82 mg/dl (ref 70–140)
Potassium: 3.9 mEq/L (ref 3.5–5.1)
SODIUM: 139 meq/L (ref 136–145)
Total Protein: 6.8 g/dL (ref 6.4–8.3)

## 2014-04-30 LAB — CBC WITH DIFFERENTIAL/PLATELET
BASO%: 0.8 % (ref 0.0–2.0)
Basophils Absolute: 0.1 10*3/uL (ref 0.0–0.1)
EOS%: 4.4 % (ref 0.0–7.0)
Eosinophils Absolute: 0.3 10*3/uL (ref 0.0–0.5)
HEMATOCRIT: 40.3 % (ref 38.4–49.9)
HGB: 13.5 g/dL (ref 13.0–17.1)
LYMPH%: 29.9 % (ref 14.0–49.0)
MCH: 30.2 pg (ref 27.2–33.4)
MCHC: 33.4 g/dL (ref 32.0–36.0)
MCV: 90.4 fL (ref 79.3–98.0)
MONO#: 0.8 10*3/uL (ref 0.1–0.9)
MONO%: 12 % (ref 0.0–14.0)
NEUT#: 3.6 10*3/uL (ref 1.5–6.5)
NEUT%: 52.9 % (ref 39.0–75.0)
Platelets: 128 10*3/uL — ABNORMAL LOW (ref 140–400)
RBC: 4.46 10*6/uL (ref 4.20–5.82)
RDW: 24.9 % — ABNORMAL HIGH (ref 11.0–14.6)
WBC: 6.8 10*3/uL (ref 4.0–10.3)
lymph#: 2 10*3/uL (ref 0.9–3.3)

## 2014-04-30 MED ORDER — FLUOROURACIL CHEMO INJECTION 2.5 GM/50ML
400.0000 mg/m2 | Freq: Once | INTRAVENOUS | Status: AC
Start: 2014-04-30 — End: 2014-04-30
  Administered 2014-04-30: 850 mg via INTRAVENOUS
  Filled 2014-04-30: qty 17

## 2014-04-30 MED ORDER — OXALIPLATIN CHEMO INJECTION 100 MG/20ML
65.0000 mg/m2 | Freq: Once | INTRAVENOUS | Status: AC
  Administered 2014-04-30: 140 mg via INTRAVENOUS
  Filled 2014-04-30: qty 28

## 2014-04-30 MED ORDER — SODIUM CHLORIDE 0.9 % IV SOLN
2400.0000 mg/m2 | INTRAVENOUS | Status: DC
  Administered 2014-04-30: 5200 mg via INTRAVENOUS
  Filled 2014-04-30: qty 104

## 2014-04-30 MED ORDER — OXYCODONE-ACETAMINOPHEN 5-325 MG PO TABS: 1.0000 | ORAL_TABLET | ORAL | Status: DC | PRN

## 2014-04-30 MED ORDER — DEXAMETHASONE SODIUM PHOSPHATE 10 MG/ML IJ SOLN
10.0000 mg | Freq: Once | INTRAMUSCULAR | Status: AC
  Administered 2014-04-30: 10 mg via INTRAVENOUS

## 2014-04-30 MED ORDER — DEXAMETHASONE SODIUM PHOSPHATE 10 MG/ML IJ SOLN
INTRAMUSCULAR | Status: AC
  Filled 2014-04-30: qty 1

## 2014-04-30 MED ORDER — ONDANSETRON 8 MG/NS 50 ML IVPB
INTRAVENOUS | Status: AC
  Filled 2014-04-30: qty 8

## 2014-04-30 MED ORDER — LEUCOVORIN CALCIUM INJECTION 350 MG
401.0000 mg/m2 | Freq: Once | INTRAVENOUS | Status: AC
  Administered 2014-04-30: 850 mg via INTRAVENOUS
  Filled 2014-04-30: qty 42.5

## 2014-04-30 MED ORDER — DEXTROSE 5 % IV SOLN
Freq: Once | INTRAVENOUS | Status: AC
  Administered 2014-04-30: 11:00:00 via INTRAVENOUS

## 2014-04-30 MED ORDER — ONDANSETRON 8 MG/50ML IVPB (CHCC)
8.0000 mg | Freq: Once | INTRAVENOUS | Status: AC
  Administered 2014-04-30: 8 mg via INTRAVENOUS

## 2014-04-30 MED ORDER — DOXYCYCLINE HYCLATE 100 MG PO TABS: ORAL_TABLET | ORAL | Status: DC

## 2014-04-30 NOTE — Patient Instructions (Signed)
[facility] Discharge Instructions for Patients Receiving Chemotherapy  Today you received the following chemotherapy agents: Oxaliplatin, Leucovorin, 5FU   To help prevent nausea and vomiting after your treatment, we encourage you to take your nausea medication as prescribed.    If you develop nausea and vomiting that is not controlled by your nausea medication, call the clinic.   BELOW ARE SYMPTOMS THAT SHOULD BE REPORTED IMMEDIATELY:  *FEVER GREATER THAN 100.5 F  *CHILLS WITH OR WITHOUT FEVER  NAUSEA AND VOMITING THAT IS NOT CONTROLLED WITH YOUR NAUSEA MEDICATION  *UNUSUAL SHORTNESS OF BREATH  *UNUSUAL BRUISING OR BLEEDING  TENDERNESS IN MOUTH AND THROAT WITH OR WITHOUT PRESENCE OF ULCERS  *URINARY PROBLEMS  *BOWEL PROBLEMS  UNUSUAL RASH Items with * indicate a potential emergency and should be followed up as soon as possible.  Feel free to call the clinic you have any questions or concerns. The clinic phone number is [phone].    

## 2014-04-30 NOTE — Telephone Encounter (Signed)
gv and printed appt sched and avs for pt for Aug and Sept.....sed added tx. °

## 2014-04-30 NOTE — Progress Notes (Addendum)
[place] OFFICE PROGRESS NOTE   Diagnosis: Colon cancer.    INTERVAL HISTORY:   Mr. Plog returns as scheduled. He completed cycle 9 FOLFOX on 04/16/2014. Oxaliplatin was held due to persistent cold sensitivity. He recently had an episode of cold sensitivity after drinking a cold beverage. He noted some mild tingling in the left hand yesterday for a short time. He denies nausea/vomiting. No mouth sores. No significant diarrhea. He has a good appetite. He has occasional back pain. He has swelling at the left lower leg/foot which he reports is chronic. No leg pain. He denies shortness of breath and chest pain. He continues to have intermittent left groin pain and numbness. This has been occurring since his initial surgery. Over the past week he has noted several small "blister" like lesions scattered over his chest and legs as well as the chin.  Objective:  Vital signs in last 24 hours:  Blood pressure 131/83, pulse 58, temperature 97.5 F (36.4 C), temperature source Oral, resp. rate 18, height 6' (1.829 m), weight 222 lb 3.2 oz (100.789 kg), SpO2 100.00%.    HEENT: No thrush or ulcers. Resp: Lungs clear bilaterally. Cardio: Regular rate and rhythm. GI: Abdomen soft and nontender. Right lower quadrant urostomy; left lower quadrant colostomy. Vascular: Trace edema at the left lower leg/ankle. Neuro: Vibratory sense mildly to moderately decreased over the fingertips per tuning fork exam.  Skin: Multiple small slightly raised skin lesions with surrounding erythema scattered over the lower extremities, anterior chest and chin. The lesions do not appear vesicular.   Port-A-Cath site without erythema.   Lab Results:  Lab Results  Component Value Date   WBC 6.8 04/30/2014   HGB 13.5 04/30/2014   HCT 40.3 04/30/2014   MCV 90.4 04/30/2014   PLT 128* 04/30/2014   NEUTROABS 3.6 04/30/2014    Imaging:  No results found.  Medications: I have reviewed the patient's  current medications.  Assessment/Plan: 1. Clinical stage IV (H5K,T6Y,B6) adenocarcinoma of the rectum with tumor directly extending to the bladder/prostate and CT scan evidence of metastatic chest adenopathy Positive K-ras mutation.  Normal mismatch repair protein expression. Microsatellite stable  Status post low anterior resection and end colostomy with a cystoprostatectomy and colon conduit urinary diversion 11/01/2013.  Staging PET scan with hypermetabolic pelvic nodes, mediastinal nodes, left adrenal metastasis, and left upper lobe nodule.  Initiation of FOLFOX 12/25/2013.  Restaging CT 03/16/2014 after 6 cycles of FOLFOX confirmed improvement in chest/pelvic lymphadenopathy, a left upper lobe nodule, and resolution of a left adrenal nodule  Oxaliplatin deleted from cycle 8 FOLFOX 04/02/2014 secondary to neuropathy and thrombocytopenia. Oxaliplatin held with cycle 9 FOLFOX 04/16/2014 due to neuropathy. 2. Microcytic anemia-likely iron deficiency, improved. He continues oral iron. 3. Gout. 4. Port-A-Cath placement 12/18/2013.  5. Anxiety. He takes Xanax as needed. 6. History of left knee pain and swelling. He was treated with a Medrol Dosepak 03/05/2014 7. Oxaliplatin neuropathy. 8. Thrombocytopenia secondary to chemotherapy. 9. Skin rash 04/30/2014. 10. Chronic edema left lower leg/ankle.   Disposition: Austin Robbins appears stable. He has completed 9 cycles of FOLFOX. Oxaliplatin was held with cycle 9 due to neuropathy.  Plan to proceed with cycle 10 FOLFOX today as scheduled. The neuropathy is better. Oxaliplatin will be resumed at a dose of 65 mg per meter squared.  The etiology of the skin rash is unclear. The rash does not appear herpetic and does not have the typical appearance of a drug rash. Question bacterial skin infection (MRSA). He  will complete a 10 day course of doxycycline. He will contact the office if the rash worsens.  He has chronic edema of the left lower  leg/ankle. He will contact the office if he develops calf pain, erythema or other concerning symptoms.  He will return for a followup visit and cycle 11 FOLFOX in 2 weeks. He will contact the office in the interim as outlined above or with any other problems.  Patient seen with Dr. Benay Spice. 25 minutes were spent face-to-face at today's visit with the majority of that time involved in counseling/coordination of care.    [name] ANP/GNP-BC   04/30/2014  11:54 AM  This was a shared visit with [name]. Mr. Chesnut was interviewed and examined. He will complete a course of doxycycline for treatment of the skin lesions that may represent a bacterial infection.  He will complete another cycle of FOLFOX today.  Julieanne Manson, M.D.

## 2014-05-02 ENCOUNTER — Ambulatory Visit (HOSPITAL_BASED_OUTPATIENT_CLINIC_OR_DEPARTMENT_OTHER): Payer: 59

## 2014-05-02 VITALS — BP 134/80 | HR 59 | Temp 98.6°F | Resp 20

## 2014-05-02 DIAGNOSIS — C20 Malignant neoplasm of rectum: Secondary | ICD-10-CM

## 2014-05-02 MED ORDER — HEPARIN SOD (PORK) LOCK FLUSH 100 UNIT/ML IV SOLN
500.0000 [IU] | Freq: Once | INTRAVENOUS | Status: AC | PRN
  Administered 2014-05-02: 500 [IU]
  Filled 2014-05-02: qty 5

## 2014-05-02 MED ORDER — SODIUM CHLORIDE 0.9 % IJ SOLN
10.0000 mL | INTRAMUSCULAR | Status: DC | PRN
  Administered 2014-05-02: 10 mL
  Filled 2014-05-02: qty 10

## 2014-05-02 NOTE — Patient Instructions (Signed)

## 2014-05-04 ENCOUNTER — Encounter (INDEPENDENT_AMBULATORY_CARE_PROVIDER_SITE_OTHER): Payer: Self-pay | Admitting: General Surgery

## 2014-05-14 ENCOUNTER — Other Ambulatory Visit: Payer: Self-pay | Admitting: Oncology

## 2014-05-15 ENCOUNTER — Ambulatory Visit (HOSPITAL_BASED_OUTPATIENT_CLINIC_OR_DEPARTMENT_OTHER): Payer: 59

## 2014-05-15 ENCOUNTER — Telehealth: Payer: Self-pay | Admitting: Oncology

## 2014-05-15 ENCOUNTER — Ambulatory Visit (HOSPITAL_BASED_OUTPATIENT_CLINIC_OR_DEPARTMENT_OTHER): Payer: 59 | Admitting: Oncology

## 2014-05-15 ENCOUNTER — Other Ambulatory Visit (HOSPITAL_BASED_OUTPATIENT_CLINIC_OR_DEPARTMENT_OTHER): Payer: 59

## 2014-05-15 ENCOUNTER — Ambulatory Visit: Payer: 59 | Admitting: Nutrition

## 2014-05-15 ENCOUNTER — Telehealth: Payer: Self-pay | Admitting: *Deleted

## 2014-05-15 VITALS — BP 138/79 | HR 55 | Temp 98.3°F | Resp 22 | Ht 72.0 in | Wt 223.6 lb

## 2014-05-15 DIAGNOSIS — D509 Iron deficiency anemia, unspecified: Secondary | ICD-10-CM

## 2014-05-15 DIAGNOSIS — C20 Malignant neoplasm of rectum: Secondary | ICD-10-CM

## 2014-05-15 DIAGNOSIS — G620 Drug-induced polyneuropathy: Secondary | ICD-10-CM

## 2014-05-15 DIAGNOSIS — C7970 Secondary malignant neoplasm of unspecified adrenal gland: Secondary | ICD-10-CM

## 2014-05-15 DIAGNOSIS — D6959 Other secondary thrombocytopenia: Secondary | ICD-10-CM

## 2014-05-15 DIAGNOSIS — Z5111 Encounter for antineoplastic chemotherapy: Secondary | ICD-10-CM

## 2014-05-15 LAB — CBC WITH DIFFERENTIAL/PLATELET
BASO%: 0.6 % (ref 0.0–2.0)
BASOS ABS: 0 10*3/uL (ref 0.0–0.1)
EOS ABS: 0.2 10*3/uL (ref 0.0–0.5)
EOS%: 3.1 % (ref 0.0–7.0)
HEMATOCRIT: 41.9 % (ref 38.4–49.9)
HEMOGLOBIN: 14.1 g/dL (ref 13.0–17.1)
LYMPH%: 27.9 % (ref 14.0–49.0)
MCH: 31.5 pg (ref 27.2–33.4)
MCHC: 33.6 g/dL (ref 32.0–36.0)
MCV: 93.9 fL (ref 79.3–98.0)
MONO#: 0.5 10*3/uL (ref 0.1–0.9)
MONO%: 8.5 % (ref 0.0–14.0)
NEUT%: 59.9 % (ref 39.0–75.0)
NEUTROS ABS: 3.3 10*3/uL (ref 1.5–6.5)
Platelets: 116 10*3/uL — ABNORMAL LOW (ref 140–400)
RBC: 4.47 10*6/uL (ref 4.20–5.82)
RDW: 21.7 % — AB (ref 11.0–14.6)
WBC: 5.5 10*3/uL (ref 4.0–10.3)
lymph#: 1.5 10*3/uL (ref 0.9–3.3)

## 2014-05-15 LAB — COMPREHENSIVE METABOLIC PANEL (CC13)
ALBUMIN: 3.4 g/dL — AB (ref 3.5–5.0)
ALT: 13 U/L (ref 0–55)
AST: 17 U/L (ref 5–34)
Alkaline Phosphatase: 89 U/L (ref 40–150)
Anion Gap: 12 mEq/L — ABNORMAL HIGH (ref 3–11)
BUN: 9.8 mg/dL (ref 7.0–26.0)
CALCIUM: 8.7 mg/dL (ref 8.4–10.4)
CO2: 17 mEq/L — ABNORMAL LOW (ref 22–29)
Chloride: 114 mEq/L — ABNORMAL HIGH (ref 98–109)
Creatinine: 1 mg/dL (ref 0.7–1.3)
GLUCOSE: 125 mg/dL (ref 70–140)
POTASSIUM: 4 meq/L (ref 3.5–5.1)
Sodium: 142 mEq/L (ref 136–145)
Total Bilirubin: 0.6 mg/dL (ref 0.20–1.20)
Total Protein: 6.9 g/dL (ref 6.4–8.3)

## 2014-05-15 MED ORDER — FLUOROURACIL CHEMO INJECTION 2.5 GM/50ML
400.0000 mg/m2 | Freq: Once | INTRAVENOUS | Status: AC
  Administered 2014-05-15: 850 mg via INTRAVENOUS
  Filled 2014-05-15: qty 17

## 2014-05-15 MED ORDER — ONDANSETRON 8 MG/NS 50 ML IVPB
INTRAVENOUS | Status: AC
  Filled 2014-05-15: qty 8

## 2014-05-15 MED ORDER — HEPARIN SOD (PORK) LOCK FLUSH 100 UNIT/ML IV SOLN
500.0000 [IU] | Freq: Once | INTRAVENOUS | Status: DC | PRN
  Filled 2014-05-15: qty 5

## 2014-05-15 MED ORDER — OXALIPLATIN CHEMO INJECTION 100 MG/20ML
65.0000 mg/m2 | Freq: Once | INTRAVENOUS | Status: AC
  Administered 2014-05-15: 140 mg via INTRAVENOUS
  Filled 2014-05-15: qty 28

## 2014-05-15 MED ORDER — DEXTROSE 5 % IV SOLN
Freq: Once | INTRAVENOUS | Status: AC
  Administered 2014-05-15: 11:00:00 via INTRAVENOUS

## 2014-05-15 MED ORDER — LEUCOVORIN CALCIUM INJECTION 350 MG
401.0000 mg/m2 | Freq: Once | INTRAVENOUS | Status: AC
  Administered 2014-05-15: 850 mg via INTRAVENOUS
  Filled 2014-05-15: qty 42.5

## 2014-05-15 MED ORDER — ONDANSETRON 8 MG/50ML IVPB (CHCC)
8.0000 mg | Freq: Once | INTRAVENOUS | Status: AC
  Administered 2014-05-15: 8 mg via INTRAVENOUS

## 2014-05-15 MED ORDER — DEXAMETHASONE SODIUM PHOSPHATE 10 MG/ML IJ SOLN
INTRAMUSCULAR | Status: AC
  Filled 2014-05-15: qty 1

## 2014-05-15 MED ORDER — DEXAMETHASONE SODIUM PHOSPHATE 10 MG/ML IJ SOLN
10.0000 mg | Freq: Once | INTRAMUSCULAR | Status: AC
  Administered 2014-05-15: 10 mg via INTRAVENOUS

## 2014-05-15 MED ORDER — SODIUM CHLORIDE 0.9 % IJ SOLN
10.0000 mL | INTRAMUSCULAR | Status: DC | PRN
  Filled 2014-05-15: qty 10

## 2014-05-15 MED ORDER — SODIUM CHLORIDE 0.9 % IV SOLN
2400.0000 mg/m2 | INTRAVENOUS | Status: DC
  Administered 2014-05-15: 5200 mg via INTRAVENOUS
  Filled 2014-05-15: qty 104

## 2014-05-15 NOTE — Progress Notes (Signed)
Brief follow up completed with patient in chemotherapy.  Patient receiving cycle 11 of FOLFOX for Rectal cancer.   Patient continues to eat well and denies nutrition problems.  He has regained some of the weight lost.  Current weight documented as 223.6 pounds.    Food and Nutrition Related Knowledge Deficit resolved.  Patient agrees to contact me with questions or concerns.  **Disclaimer: This note was dictated with voice recognition software. Similar sounding words can inadvertently be transcribed and this note may contain transcription errors which may not have been corrected upon publication of note.**

## 2014-05-15 NOTE — Telephone Encounter (Signed)
Pt confirmed labs/ov per 09/08 POF, sent msg to add chemo, gave pt AVS....KJ °

## 2014-05-15 NOTE — Telephone Encounter (Signed)
Per staff message and POF I have scheduled appts. Advised scheduler of appts. JMW  

## 2014-05-15 NOTE — Progress Notes (Signed)
  [place] OFFICE PROGRESS NOTE   Diagnosis: Rectal cancer  INTERVAL HISTORY:   He returns as scheduled. He completed another cycle of FOLFOX 04/30/2014. He reports intermittent tingling in the left arm and hand. No significant numbness. This does not interfere with activity. The pustular skin rash resolved with doxycycline. He developed a sunburn after golfing last weekend. He continues to work.  Objective:  Vital signs in last 24 hours:  Blood pressure 138/79, pulse 55, temperature 98.3 F (36.8 C), temperature source Oral, resp. rate 22, height 6' (1.829 m), weight 223 lb 9.6 oz (101.424 kg), SpO2 98.00%.    HEENT: No thrush or ulcers Resp: Lungs clear bilaterally Cardio: Regular rate and rhythm GI: No hepatomegaly, low abdomen colostomy and urostomy Vascular: Trace edema at the low pretibial area bilaterally Neuro: Mild decrease in vibratory sense at the fingertips bilaterally  Skin: Sun distribution erythema over the head and arm, healing pustular lesions over the thighs   Portacath/PICC-without erythema  Lab Results:  Lab Results  Component Value Date   WBC 5.5 05/15/2014   HGB 14.1 05/15/2014   HCT 41.9 05/15/2014   MCV 93.9 05/15/2014   PLT 116* 05/15/2014   NEUTROABS 3.3 05/15/2014    Lab Results  Component Value Date   NA 142 05/15/2014    Lab Results  Component Value Date   CEA 3.4 12/11/2013    Imaging:  No results found.  Medications: I have reviewed the patient's current medications.  Assessment/Plan: 1. Clinical stage IV (O1H,Y8M,V7) adenocarcinoma of the rectum with tumor directly extending to the bladder/prostate and CT scan evidence of metastatic chest adenopathy Positive K-ras mutation.  Normal mismatch repair protein expression. Microsatellite stable  Status post low anterior resection and end colostomy with a cystoprostatectomy and colon conduit urinary diversion 11/01/2013.  Staging PET scan with hypermetabolic pelvic nodes,  mediastinal nodes, left adrenal metastasis, and left upper lobe nodule.  Initiation of FOLFOX 12/25/2013.  Restaging CT 03/16/2014 after 6 cycles of FOLFOX confirmed improvement in chest/pelvic lymphadenopathy, a left upper lobe nodule, and resolution of a left adrenal nodule  Oxaliplatin deleted from cycle 8 FOLFOX 04/02/2014 secondary to neuropathy and thrombocytopenia.  Oxaliplatin held with cycle 9 FOLFOX 04/16/2014 due to neuropathy. 2. Microcytic anemia-likely iron deficiency, improved. He continues oral iron. 3. Gout. 4. Port-A-Cath placement 12/18/2013.  5. Anxiety. He takes Xanax as needed. 6. History of left knee pain and swelling. He was treated with a Medrol Dosepak 03/05/2014 7. Oxaliplatin neuropathy. 8. Thrombocytopenia secondary to chemotherapy. 9. Skin rash 04/30/2014-resolved with doxycycline 10. Chronic edema left lower leg/ankle.   Disposition:  His overall status appears stable. He has completed 10 cycles of FOLFOX. The plan is to proceed with cycle 11 today. He has mild oxaliplatin neuropathy. The plan is to switch to maintenance 5-fluorouracil after 12 cycles of FOLFOX. We will plan for a restaging CT evaluation in October.  Mr. Sedlacek was advised to use sunscreen and avoid heavy sun exposure.  He will return for an office visit and chemotherapy in 2 weeks.  [name], MD  05/15/2014  9:33 AM

## 2014-05-15 NOTE — Patient Instructions (Addendum)
[facility] Discharge Instructions for Patients Receiving Chemotherapy  Today you received the following chemotherapy agents Oxaliplatin, 5FU, Leucovorin  To help prevent nausea and vomiting after your treatment, we encourage you to take your nausea medication as directed.  If you develop nausea and vomiting that is not controlled by your nausea medication, call the clinic.   BELOW ARE SYMPTOMS THAT SHOULD BE REPORTED IMMEDIATELY:  *FEVER GREATER THAN 100.5 F  *CHILLS WITH OR WITHOUT FEVER  NAUSEA AND VOMITING THAT IS NOT CONTROLLED WITH YOUR NAUSEA MEDICATION  *UNUSUAL SHORTNESS OF BREATH  *UNUSUAL BRUISING OR BLEEDING  TENDERNESS IN MOUTH AND THROAT WITH OR WITHOUT PRESENCE OF ULCERS  *URINARY PROBLEMS  *BOWEL PROBLEMS  UNUSUAL RASH Items with * indicate a potential emergency and should be followed up as soon as possible.  Feel free to call the clinic you have any questions or concerns. The clinic phone number is [phone].

## 2014-05-17 ENCOUNTER — Ambulatory Visit (HOSPITAL_BASED_OUTPATIENT_CLINIC_OR_DEPARTMENT_OTHER): Payer: 59

## 2014-05-17 VITALS — BP 123/68 | HR 63 | Temp 98.5°F

## 2014-05-17 DIAGNOSIS — C20 Malignant neoplasm of rectum: Secondary | ICD-10-CM

## 2014-05-17 MED ORDER — SODIUM CHLORIDE 0.9 % IJ SOLN
10.0000 mL | INTRAMUSCULAR | Status: DC | PRN
  Administered 2014-05-17: 10 mL
  Filled 2014-05-17: qty 10

## 2014-05-17 MED ORDER — HEPARIN SOD (PORK) LOCK FLUSH 100 UNIT/ML IV SOLN
500.0000 [IU] | Freq: Once | INTRAVENOUS | Status: AC | PRN
  Administered 2014-05-17: 500 [IU]
  Filled 2014-05-17: qty 5

## 2014-05-23 ENCOUNTER — Telehealth: Payer: Self-pay | Admitting: *Deleted

## 2014-05-23 NOTE — Telephone Encounter (Signed)
Pr staff message I have adjusted 10/5 appt

## 2014-05-27 ENCOUNTER — Other Ambulatory Visit: Payer: Self-pay | Admitting: Oncology

## 2014-05-28 ENCOUNTER — Other Ambulatory Visit (HOSPITAL_BASED_OUTPATIENT_CLINIC_OR_DEPARTMENT_OTHER): Payer: 59

## 2014-05-28 ENCOUNTER — Ambulatory Visit (HOSPITAL_BASED_OUTPATIENT_CLINIC_OR_DEPARTMENT_OTHER): Payer: 59 | Admitting: Nurse Practitioner

## 2014-05-28 ENCOUNTER — Telehealth: Payer: Self-pay | Admitting: Oncology

## 2014-05-28 ENCOUNTER — Ambulatory Visit (HOSPITAL_BASED_OUTPATIENT_CLINIC_OR_DEPARTMENT_OTHER): Payer: 59

## 2014-05-28 VITALS — BP 141/92 | HR 59 | Temp 98.3°F | Resp 20 | Ht 72.0 in | Wt 222.6 lb

## 2014-05-28 DIAGNOSIS — Z5111 Encounter for antineoplastic chemotherapy: Secondary | ICD-10-CM

## 2014-05-28 DIAGNOSIS — C7970 Secondary malignant neoplasm of unspecified adrenal gland: Secondary | ICD-10-CM

## 2014-05-28 DIAGNOSIS — G620 Drug-induced polyneuropathy: Secondary | ICD-10-CM

## 2014-05-28 DIAGNOSIS — D509 Iron deficiency anemia, unspecified: Secondary | ICD-10-CM

## 2014-05-28 DIAGNOSIS — C20 Malignant neoplasm of rectum: Secondary | ICD-10-CM

## 2014-05-28 DIAGNOSIS — R609 Edema, unspecified: Secondary | ICD-10-CM

## 2014-05-28 DIAGNOSIS — D6959 Other secondary thrombocytopenia: Secondary | ICD-10-CM

## 2014-05-28 LAB — CBC WITH DIFFERENTIAL/PLATELET
BASO%: 0.8 % (ref 0.0–2.0)
Basophils Absolute: 0 10*3/uL (ref 0.0–0.1)
EOS%: 2 % (ref 0.0–7.0)
Eosinophils Absolute: 0.1 10*3/uL (ref 0.0–0.5)
HEMATOCRIT: 42.4 % (ref 38.4–49.9)
HGB: 14.4 g/dL (ref 13.0–17.1)
LYMPH%: 29.8 % (ref 14.0–49.0)
MCH: 32.4 pg (ref 27.2–33.4)
MCHC: 33.9 g/dL (ref 32.0–36.0)
MCV: 95.6 fL (ref 79.3–98.0)
MONO#: 0.6 10*3/uL (ref 0.1–0.9)
MONO%: 12.9 % (ref 0.0–14.0)
NEUT#: 2.7 10*3/uL (ref 1.5–6.5)
NEUT%: 54.5 % (ref 39.0–75.0)
Platelets: 119 10*3/uL — ABNORMAL LOW (ref 140–400)
RBC: 4.44 10*6/uL (ref 4.20–5.82)
RDW: 19.6 % — ABNORMAL HIGH (ref 11.0–14.6)
WBC: 5 10*3/uL (ref 4.0–10.3)
lymph#: 1.5 10*3/uL (ref 0.9–3.3)

## 2014-05-28 LAB — COMPREHENSIVE METABOLIC PANEL (CC13)
ALK PHOS: 94 U/L (ref 40–150)
ALT: 11 U/L (ref 0–55)
ANION GAP: 11 meq/L (ref 3–11)
AST: 17 U/L (ref 5–34)
Albumin: 3.4 g/dL — ABNORMAL LOW (ref 3.5–5.0)
BILIRUBIN TOTAL: 0.49 mg/dL (ref 0.20–1.20)
BUN: 12.8 mg/dL (ref 7.0–26.0)
CO2: 18 meq/L — AB (ref 22–29)
CREATININE: 0.9 mg/dL (ref 0.7–1.3)
Calcium: 8.7 mg/dL (ref 8.4–10.4)
Chloride: 111 mEq/L — ABNORMAL HIGH (ref 98–109)
Glucose: 75 mg/dl (ref 70–140)
Potassium: 4.3 mEq/L (ref 3.5–5.1)
Sodium: 140 mEq/L (ref 136–145)
Total Protein: 7.3 g/dL (ref 6.4–8.3)

## 2014-05-28 MED ORDER — FLUOROURACIL CHEMO INJECTION 2.5 GM/50ML
400.0000 mg/m2 | Freq: Once | INTRAVENOUS | Status: AC
  Administered 2014-05-28: 850 mg via INTRAVENOUS
  Filled 2014-05-28: qty 17

## 2014-05-28 MED ORDER — ONDANSETRON 8 MG/NS 50 ML IVPB
INTRAVENOUS | Status: AC
Start: 2014-05-28 — End: 2014-05-28
  Filled 2014-05-28: qty 8

## 2014-05-28 MED ORDER — DEXTROSE 5 % IV SOLN
Freq: Once | INTRAVENOUS | Status: AC
  Administered 2014-05-28: 11:00:00 via INTRAVENOUS

## 2014-05-28 MED ORDER — DEXTROSE 5 % IV SOLN
401.0000 mg/m2 | Freq: Once | INTRAVENOUS | Status: AC
  Administered 2014-05-28: 850 mg via INTRAVENOUS
  Filled 2014-05-28: qty 42.5

## 2014-05-28 MED ORDER — DEXAMETHASONE SODIUM PHOSPHATE 10 MG/ML IJ SOLN
INTRAMUSCULAR | Status: AC
  Filled 2014-05-28: qty 1

## 2014-05-28 MED ORDER — ALPRAZOLAM 1 MG PO TABS: ORAL_TABLET | ORAL | Status: DC

## 2014-05-28 MED ORDER — OXYCODONE-ACETAMINOPHEN 5-325 MG PO TABS: 1.0000 | ORAL_TABLET | ORAL | Status: DC | PRN

## 2014-05-28 MED ORDER — SODIUM CHLORIDE 0.9 % IV SOLN
2400.0000 mg/m2 | INTRAVENOUS | Status: DC
  Administered 2014-05-28: 5200 mg via INTRAVENOUS
  Filled 2014-05-28: qty 104

## 2014-05-28 NOTE — Patient Instructions (Signed)
[facility] Discharge Instructions for Patients Receiving Chemotherapy  Today you received the following chemotherapy agents leucovorin and 5FU  To help prevent nausea and vomiting after your treatment, we encourage you to take your nausea medication if needed   If you develop nausea and vomiting that is not controlled by your nausea medication, call the clinic.   BELOW ARE SYMPTOMS THAT SHOULD BE REPORTED IMMEDIATELY:  *FEVER GREATER THAN 100.5 F  *CHILLS WITH OR WITHOUT FEVER  NAUSEA AND VOMITING THAT IS NOT CONTROLLED WITH YOUR NAUSEA MEDICATION  *UNUSUAL SHORTNESS OF BREATH  *UNUSUAL BRUISING OR BLEEDING  TENDERNESS IN MOUTH AND THROAT WITH OR WITHOUT PRESENCE OF ULCERS  *URINARY PROBLEMS  *BOWEL PROBLEMS  UNUSUAL RASH Items with * indicate a potential emergency and should be followed up as soon as possible.  Feel free to call the clinic you have any questions or concerns. The clinic phone number is [phone].

## 2014-05-28 NOTE — Telephone Encounter (Signed)
gv and printed appt sched and avs for pt for Sept and OCT...sed added tx....gv pt barium °

## 2014-05-28 NOTE — Progress Notes (Addendum)
  [place] OFFICE PROGRESS NOTE   Diagnosis:  Rectal cancer  INTERVAL HISTORY:   Austin Robbins returns as scheduled. He completed cycle 11 FOLFOX on 05/15/2014. He denies nausea/vomiting. No mouth sores. He has intermittent loose stools. He has a good appetite. He continues to have pain at the left groin. This has been occurring since surgery. He takes Percocet as needed. He noted increased numbness in his feet the day after pump discontinuation. He continues to have mild numbness. He has intermittent numbness/tingling in the left hand. He notes that the thumbs are more known in the fingers.   Objective:  Vital signs in last 24 hours:  Blood pressure 141/92, pulse 59, temperature 98.3 F (36.8 C), temperature source Oral, resp. rate 20, height 6' (1.829 m), weight 222 lb 9.6 oz (100.971 kg).    HEENT: No thrush or ulcers. Resp: Lungs clear bilaterally. Cardio: Regular rate and rhythm. GI: Abdomen soft and nontender. No hepatomegaly. Low abdomen colostomy and urostomy. Vascular: Trace edema bilateral pretibial regions. Calves nontender. Neuro: Moderate decrease in vibratory sense over the fingertips bilaterally.  Port-A-Cath site without erythema    Lab Results:  Lab Results  Component Value Date   WBC 5.0 05/28/2014   HGB 14.4 05/28/2014   HCT 42.4 05/28/2014   MCV 95.6 05/28/2014   PLT 119* 05/28/2014   NEUTROABS 2.7 05/28/2014    Imaging:  No results found.  Medications: I have reviewed the patient's current medications.  Assessment/Plan: 1. Clinical stage IV (W2H,E5I,D7) adenocarcinoma of the rectum with tumor directly extending to the bladder/prostate and CT scan evidence of metastatic chest adenopathy Positive K-ras mutation.  Normal mismatch repair protein expression. Microsatellite stable  Status post low anterior resection and end colostomy with a cystoprostatectomy and colon conduit urinary diversion 11/01/2013.  Staging PET scan with hypermetabolic  pelvic nodes, mediastinal nodes, left adrenal metastasis, and left upper lobe nodule.  Initiation of FOLFOX 12/25/2013.  Restaging CT 03/16/2014 after 6 cycles of FOLFOX confirmed improvement in chest/pelvic lymphadenopathy, a left upper lobe nodule, and resolution of a left adrenal nodule  Oxaliplatin deleted from cycle 8 FOLFOX 04/02/2014 secondary to neuropathy and thrombocytopenia.  Oxaliplatin held with cycle 9 FOLFOX 04/16/2014 due to neuropathy. 2. Microcytic anemia-likely iron deficiency, improved. He continues oral iron. 3. Gout. 4. Port-A-Cath placement 12/18/2013.  5. Anxiety. He takes Xanax as needed. 6. History of left knee pain and swelling. He was treated with a Medrol Dosepak 03/05/2014 7. Oxaliplatin neuropathy. 8. Thrombocytopenia secondary to chemotherapy. 9. Skin rash 04/30/2014-resolved with doxycycline 10. Chronic edema left lower leg/ankle.   Disposition: [name] appears stable. He has completed 11 cycles of FOLFOX. He has increased neuropathy symptoms. Plan to proceed with cycle 12 today as scheduled. Oxaliplatin will be held. We are referring him for restaging CT scans prior to his next visit in 2 weeks. He will contact the office prior to his next visit with any problems.  Patient seen with Dr. Benay Spice.    Austin Robbins ANP/GNP-BC   05/28/2014  9:52 AM  This is a shared visit with Austin Robbins. The plan is to complete cycle 12 chemotherapy today prior to a restaging evaluation. We decided to hold oxaliplatin secondary to progressive neuropathy symptoms.  Austin Robbins, M.D.

## 2014-05-30 ENCOUNTER — Ambulatory Visit (HOSPITAL_BASED_OUTPATIENT_CLINIC_OR_DEPARTMENT_OTHER): Payer: 59

## 2014-05-30 VITALS — BP 141/83 | HR 65 | Temp 98.2°F

## 2014-05-30 DIAGNOSIS — C20 Malignant neoplasm of rectum: Secondary | ICD-10-CM

## 2014-05-30 MED ORDER — SODIUM CHLORIDE 0.9 % IJ SOLN
10.0000 mL | INTRAMUSCULAR | Status: DC | PRN
  Administered 2014-05-30: 10 mL
  Filled 2014-05-30: qty 10

## 2014-05-30 MED ORDER — HEPARIN SOD (PORK) LOCK FLUSH 100 UNIT/ML IV SOLN
500.0000 [IU] | Freq: Once | INTRAVENOUS | Status: AC | PRN
  Administered 2014-05-30: 500 [IU]
  Filled 2014-05-30: qty 5

## 2014-06-04 ENCOUNTER — Telehealth: Payer: Self-pay | Admitting: Oncology

## 2014-06-04 NOTE — Telephone Encounter (Signed)
adjusted 10/5 appts by 30 min (room resource mgmt). lmonvm informing pt and gv new time for 10/5 @ 9:15am. pt also mychart active.

## 2014-06-08 ENCOUNTER — Ambulatory Visit (HOSPITAL_COMMUNITY)
Admission: RE | Admit: 2014-06-08 | Discharge: 2014-06-08 | Disposition: A | Discharge status: Home / Self Care | Payer: 59 | Source: Ambulatory Visit | Attending: Nurse Practitioner | Admitting: Nurse Practitioner

## 2014-06-08 DIAGNOSIS — I700 Atherosclerosis of aorta: Secondary | ICD-10-CM | POA: Insufficient documentation

## 2014-06-08 DIAGNOSIS — I2510 Atherosclerotic heart disease of native coronary artery without angina pectoris: Secondary | ICD-10-CM | POA: Insufficient documentation

## 2014-06-08 DIAGNOSIS — C20 Malignant neoplasm of rectum: Secondary | ICD-10-CM | POA: Insufficient documentation

## 2014-06-08 DIAGNOSIS — Z933 Colostomy status: Secondary | ICD-10-CM | POA: Diagnosis not present

## 2014-06-08 DIAGNOSIS — Z9049 Acquired absence of other specified parts of digestive tract: Secondary | ICD-10-CM | POA: Diagnosis not present

## 2014-06-08 DIAGNOSIS — C7972 Secondary malignant neoplasm of left adrenal gland: Secondary | ICD-10-CM | POA: Insufficient documentation

## 2014-06-08 DIAGNOSIS — C771 Secondary and unspecified malignant neoplasm of intrathoracic lymph nodes: Secondary | ICD-10-CM | POA: Diagnosis not present

## 2014-06-08 MED ORDER — IOHEXOL 300 MG/ML  SOLN
100.0000 mL | Freq: Once | INTRAMUSCULAR | Status: AC | PRN
  Administered 2014-06-08: 100 mL via INTRAVENOUS

## 2014-06-11 ENCOUNTER — Other Ambulatory Visit: Payer: Self-pay | Admitting: Oncology

## 2014-06-11 ENCOUNTER — Ambulatory Visit (HOSPITAL_BASED_OUTPATIENT_CLINIC_OR_DEPARTMENT_OTHER): Payer: 59

## 2014-06-11 ENCOUNTER — Telehealth: Payer: Self-pay | Admitting: Oncology

## 2014-06-11 ENCOUNTER — Other Ambulatory Visit (HOSPITAL_BASED_OUTPATIENT_CLINIC_OR_DEPARTMENT_OTHER): Payer: 59

## 2014-06-11 ENCOUNTER — Ambulatory Visit: Payer: 59 | Admitting: Oncology

## 2014-06-11 ENCOUNTER — Ambulatory Visit (HOSPITAL_BASED_OUTPATIENT_CLINIC_OR_DEPARTMENT_OTHER): Payer: 59 | Admitting: Nurse Practitioner

## 2014-06-11 VITALS — BP 153/90 | HR 50 | Temp 98.1°F | Resp 18 | Ht 72.0 in | Wt 226.6 lb

## 2014-06-11 DIAGNOSIS — R911 Solitary pulmonary nodule: Secondary | ICD-10-CM

## 2014-06-11 DIAGNOSIS — G620 Drug-induced polyneuropathy: Secondary | ICD-10-CM

## 2014-06-11 DIAGNOSIS — C20 Malignant neoplasm of rectum: Secondary | ICD-10-CM

## 2014-06-11 DIAGNOSIS — C7972 Secondary malignant neoplasm of left adrenal gland: Secondary | ICD-10-CM

## 2014-06-11 DIAGNOSIS — D6959 Other secondary thrombocytopenia: Secondary | ICD-10-CM

## 2014-06-11 DIAGNOSIS — R609 Edema, unspecified: Secondary | ICD-10-CM

## 2014-06-11 DIAGNOSIS — D509 Iron deficiency anemia, unspecified: Secondary | ICD-10-CM

## 2014-06-11 DIAGNOSIS — Z5111 Encounter for antineoplastic chemotherapy: Secondary | ICD-10-CM

## 2014-06-11 LAB — CBC WITH DIFFERENTIAL/PLATELET
BASO%: 0.3 % (ref 0.0–2.0)
Basophils Absolute: 0 10*3/uL (ref 0.0–0.1)
EOS%: 2.3 % (ref 0.0–7.0)
Eosinophils Absolute: 0.2 10*3/uL (ref 0.0–0.5)
HCT: 42.1 % (ref 38.4–49.9)
HGB: 13.9 g/dL (ref 13.0–17.1)
LYMPH%: 21.6 % (ref 14.0–49.0)
MCH: 32.3 pg (ref 27.2–33.4)
MCHC: 33 g/dL (ref 32.0–36.0)
MCV: 97.8 fL (ref 79.3–98.0)
MONO#: 1.1 10*3/uL — ABNORMAL HIGH (ref 0.1–0.9)
MONO%: 15.4 % — ABNORMAL HIGH (ref 0.0–14.0)
NEUT%: 60.4 % (ref 39.0–75.0)
NEUTROS ABS: 4.3 10*3/uL (ref 1.5–6.5)
PLATELETS: 109 10*3/uL — AB (ref 140–400)
RBC: 4.3 10*6/uL (ref 4.20–5.82)
RDW: 18.2 % — AB (ref 11.0–14.6)
WBC: 7.1 10*3/uL (ref 4.0–10.3)
lymph#: 1.5 10*3/uL (ref 0.9–3.3)

## 2014-06-11 LAB — CEA: CEA: 3.1 ng/mL (ref 0.0–5.0)

## 2014-06-11 MED ORDER — HEPARIN SOD (PORK) LOCK FLUSH 100 UNIT/ML IV SOLN
500.0000 [IU] | Freq: Once | INTRAVENOUS | Status: DC | PRN
  Filled 2014-06-11: qty 5

## 2014-06-11 MED ORDER — LEUCOVORIN CALCIUM INJECTION 350 MG
850.0000 mg | Freq: Once | INTRAMUSCULAR | Status: AC
  Administered 2014-06-11: 850 mg via INTRAVENOUS
  Filled 2014-06-11: qty 42.5

## 2014-06-11 MED ORDER — DEXTROSE 5 % IV SOLN: Freq: Once | INTRAVENOUS | Status: DC

## 2014-06-11 MED ORDER — SODIUM CHLORIDE 0.9 % IV SOLN
2400.0000 mg/m2 | INTRAVENOUS | Status: DC
  Administered 2014-06-11: 5200 mg via INTRAVENOUS
  Filled 2014-06-11: qty 104

## 2014-06-11 MED ORDER — SODIUM CHLORIDE 0.9 % IV SOLN
Freq: Once | INTRAVENOUS | Status: AC
  Administered 2014-06-11: 11:00:00 via INTRAVENOUS

## 2014-06-11 MED ORDER — SODIUM CHLORIDE 0.9 % IJ SOLN
10.0000 mL | INTRAMUSCULAR | Status: DC | PRN
  Filled 2014-06-11: qty 10

## 2014-06-11 MED ORDER — FLUOROURACIL CHEMO INJECTION 2.5 GM/50ML
400.0000 mg/m2 | Freq: Once | INTRAVENOUS | Status: AC
  Administered 2014-06-11: 850 mg via INTRAVENOUS
  Filled 2014-06-11: qty 17

## 2014-06-11 NOTE — Telephone Encounter (Signed)
gv and printed appt sched and avs for pt for OCT and NOV....sed added tx. °

## 2014-06-11 NOTE — Patient Instructions (Signed)
[facility] Discharge Instructions for Patients Receiving Chemotherapy  Today you received the following chemotherapy agents:  Leucovorin and 5FU.  To help prevent nausea and vomiting after your treatment, we encourage you to take your nausea medication as ordered per MD.   If you develop nausea and vomiting that is not controlled by your nausea medication, call the clinic.   BELOW ARE SYMPTOMS THAT SHOULD BE REPORTED IMMEDIATELY:  *FEVER GREATER THAN 100.5 F  *CHILLS WITH OR WITHOUT FEVER  NAUSEA AND VOMITING THAT IS NOT CONTROLLED WITH YOUR NAUSEA MEDICATION  *UNUSUAL SHORTNESS OF BREATH  *UNUSUAL BRUISING OR BLEEDING  TENDERNESS IN MOUTH AND THROAT WITH OR WITHOUT PRESENCE OF ULCERS  *URINARY PROBLEMS  *BOWEL PROBLEMS  UNUSUAL RASH Items with * indicate a potential emergency and should be followed up as soon as possible.  Feel free to call the clinic you have any questions or concerns. The clinic phone number is [phone].    

## 2014-06-11 NOTE — Progress Notes (Addendum)
[place] OFFICE PROGRESS NOTE   Diagnosis:  Rectal cancer  INTERVAL HISTORY:   Mr. Mccathern returns as scheduled. He completed cycle 12 FOLFOX on 05/28/2014. Oxaliplatin was held due to increased neuropathy symptoms. He denies nausea/vomiting. He had a single mouth sore under his tongue which has resolved. No diarrhea.He has persistent numbness in the feet. He reports a good appetite. He continues to have pain intermittently at the left groin.  Objective:  Vital signs in last 24 hours:  Blood pressure 153/90, pulse 50, temperature 98.1 F (36.7 C), temperature source Oral, resp. rate 18, height 6' (1.829 m), weight 226 lb 9.6 oz (102.785 kg).    HEENT: No thrush or ulcers. Resp: Lungs clear bilaterally. Cardio: Regular rate and rhythm. GI: Abdomen soft and nontender. No hepatomegaly. Low abdomen colostomy and urostomy. Vascular: No leg edema.  Port-A-Cath without erythema.    Lab Results:  Lab Results  Component Value Date   WBC 7.1 06/11/2014   HGB 13.9 06/11/2014   HCT 42.1 06/11/2014   MCV 97.8 06/11/2014   PLT 109* 06/11/2014   NEUTROABS 4.3 06/11/2014    Imaging:  No results found.  Medications: I have reviewed the patient's current medications.  Assessment/Plan: 1. Clinical stage IV (X5M,W4X,L2) adenocarcinoma of the rectum with tumor directly extending to the bladder/prostate and CT scan evidence of metastatic chest adenopathy Positive K-ras mutation.  Normal mismatch repair protein expression. Microsatellite stable  Status post low anterior resection and end colostomy with a cystoprostatectomy and colon conduit urinary diversion 11/01/2013.  Staging PET scan with hypermetabolic pelvic nodes, mediastinal nodes, left adrenal metastasis, and left upper lobe nodule.  Initiation of FOLFOX 12/25/2013.  Restaging CT 03/16/2014 after 6 cycles of FOLFOX confirmed improvement in chest/pelvic lymphadenopathy, a left upper lobe nodule, and resolution of a  left adrenal nodule  Oxaliplatin deleted from cycle 8 FOLFOX 04/02/2014 secondary to neuropathy and thrombocytopenia.  Oxaliplatin held with cycle 9 FOLFOX 04/16/2014 due to neuropathy. Cycle 10 FOLFOX 04/30/2014. Cycle 11 FOLFOX 05/15/2014. Cycle 12 FOLFOX 05/28/2014. Oxaliplatin held due to increased neuropathy. Restaging CT evaluation 06/08/2014 with stable mediastinal and hilar adenopathy. Stable borderline left iliac lymph node. No new or progressive disease identified within the chest, abdomen or pelvis. "Maintenance" 5-fluorouracil beginning 06/11/2014. 2. Microcytic anemia-likely iron deficiency, improved. He continues oral iron. 3. Gout. 4. Port-A-Cath placement 12/18/2013.  5. Anxiety. He takes Xanax as needed. 6. History of left knee pain and swelling. He was treated with a Medrol Dosepak 03/05/2014 7. Oxaliplatin neuropathy. 8. Thrombocytopenia secondary to chemotherapy. 9. Skin rash 04/30/2014-resolved with doxycycline 10. Chronic edema left lower leg/ankle.   Disposition: Mr. Hevener appears stable. He has completed 12 cycles of FOLFOX chemotherapy. The recent restaging CT evaluation showed stable disease with no new disease. Dr. Benay Spice recommends discontinuation of oxaliplatin and continuation of "maintenance" 5-fluorouracil. We discussed continuing the 5-fluorouracil as per the FOLFOX regimen versus changing to Xeloda on a 7 day on/7 day off schedule. We reviewed potential toxicities associated with Xeloda. He is most comfortable continuing with 5 fluorouracil as per the FOLFOX regimen. This will be continued on a 2 week schedule for now.  He will return for a followup visit in 2 weeks. He will contact the office in the interim with any problems.  Patient seen with Dr. Benay Spice. CT report/images reviewed with Mr. Zenz and his family by Dr. Benay Spice. 25 minutes were spent face-to-face at today's visit with the majority of that time involved in counseling/coordination of  care.  [name] ANP/GNP-BC   06/11/2014  10:34 AM  This was a shared visit with [name]. I reviewed the restaging CT scans and discuss treatment options with Mr. Enck. I recommend "maintenance "5-fluorouracil therapy. He would like to continue the 5-fluorouracil pump.  Julieanne Manson, M.D.

## 2014-06-13 ENCOUNTER — Ambulatory Visit (HOSPITAL_BASED_OUTPATIENT_CLINIC_OR_DEPARTMENT_OTHER): Payer: 59

## 2014-06-13 VITALS — BP 129/75 | HR 69 | Temp 98.6°F | Resp 20

## 2014-06-13 DIAGNOSIS — C7972 Secondary malignant neoplasm of left adrenal gland: Secondary | ICD-10-CM

## 2014-06-13 DIAGNOSIS — C20 Malignant neoplasm of rectum: Secondary | ICD-10-CM

## 2014-06-13 MED ORDER — SODIUM CHLORIDE 0.9 % IJ SOLN
10.0000 mL | INTRAMUSCULAR | Status: DC | PRN
  Administered 2014-06-13: 10 mL
  Filled 2014-06-13: qty 10

## 2014-06-13 MED ORDER — HEPARIN SOD (PORK) LOCK FLUSH 100 UNIT/ML IV SOLN
500.0000 [IU] | Freq: Once | INTRAVENOUS | Status: AC | PRN
  Administered 2014-06-13: 500 [IU]
  Filled 2014-06-13: qty 5

## 2014-06-13 NOTE — Patient Instructions (Signed)

## 2014-06-24 ENCOUNTER — Other Ambulatory Visit: Payer: Self-pay | Admitting: Oncology

## 2014-06-25 ENCOUNTER — Other Ambulatory Visit (HOSPITAL_BASED_OUTPATIENT_CLINIC_OR_DEPARTMENT_OTHER): Payer: 59

## 2014-06-25 ENCOUNTER — Ambulatory Visit (HOSPITAL_BASED_OUTPATIENT_CLINIC_OR_DEPARTMENT_OTHER): Payer: 59 | Admitting: Oncology

## 2014-06-25 ENCOUNTER — Telehealth: Payer: Self-pay | Admitting: Oncology

## 2014-06-25 ENCOUNTER — Ambulatory Visit (HOSPITAL_BASED_OUTPATIENT_CLINIC_OR_DEPARTMENT_OTHER): Payer: 59

## 2014-06-25 VITALS — BP 131/84 | HR 63 | Temp 98.4°F | Resp 18 | Ht 72.0 in | Wt 229.6 lb

## 2014-06-25 DIAGNOSIS — C20 Malignant neoplasm of rectum: Secondary | ICD-10-CM

## 2014-06-25 DIAGNOSIS — R609 Edema, unspecified: Secondary | ICD-10-CM

## 2014-06-25 DIAGNOSIS — Z23 Encounter for immunization: Secondary | ICD-10-CM

## 2014-06-25 DIAGNOSIS — D509 Iron deficiency anemia, unspecified: Secondary | ICD-10-CM

## 2014-06-25 DIAGNOSIS — R911 Solitary pulmonary nodule: Secondary | ICD-10-CM

## 2014-06-25 DIAGNOSIS — D6959 Other secondary thrombocytopenia: Secondary | ICD-10-CM

## 2014-06-25 DIAGNOSIS — C7970 Secondary malignant neoplasm of unspecified adrenal gland: Secondary | ICD-10-CM

## 2014-06-25 DIAGNOSIS — Z5111 Encounter for antineoplastic chemotherapy: Secondary | ICD-10-CM

## 2014-06-25 LAB — COMPREHENSIVE METABOLIC PANEL (CC13)
ALBUMIN: 3.5 g/dL (ref 3.5–5.0)
ALK PHOS: 88 U/L (ref 40–150)
ALT: 18 U/L (ref 0–55)
ANION GAP: 5 meq/L (ref 3–11)
AST: 18 U/L (ref 5–34)
BUN: 12 mg/dL (ref 7.0–26.0)
CHLORIDE: 112 meq/L — AB (ref 98–109)
CO2: 23 mEq/L (ref 22–29)
Calcium: 9.2 mg/dL (ref 8.4–10.4)
Creatinine: 0.9 mg/dL (ref 0.7–1.3)
Glucose: 101 mg/dl (ref 70–140)
Potassium: 4.4 mEq/L (ref 3.5–5.1)
SODIUM: 140 meq/L (ref 136–145)
TOTAL PROTEIN: 6.9 g/dL (ref 6.4–8.3)
Total Bilirubin: 0.5 mg/dL (ref 0.20–1.20)

## 2014-06-25 LAB — CBC WITH DIFFERENTIAL/PLATELET
BASO%: 0.2 % (ref 0.0–2.0)
Basophils Absolute: 0 10*3/uL (ref 0.0–0.1)
EOS%: 2.5 % (ref 0.0–7.0)
Eosinophils Absolute: 0.2 10*3/uL (ref 0.0–0.5)
HCT: 41.2 % (ref 38.4–49.9)
HGB: 14.2 g/dL (ref 13.0–17.1)
LYMPH%: 23.6 % (ref 14.0–49.0)
MCH: 33.3 pg (ref 27.2–33.4)
MCHC: 34.5 g/dL (ref 32.0–36.0)
MCV: 96.5 fL (ref 79.3–98.0)
MONO#: 0.7 10*3/uL (ref 0.1–0.9)
MONO%: 11.5 % (ref 0.0–14.0)
NEUT%: 62.2 % (ref 39.0–75.0)
NEUTROS ABS: 3.8 10*3/uL (ref 1.5–6.5)
Platelets: 88 10*3/uL — ABNORMAL LOW (ref 140–400)
RBC: 4.27 10*6/uL (ref 4.20–5.82)
RDW: 16.2 % — ABNORMAL HIGH (ref 11.0–14.6)
WBC: 6.1 10*3/uL (ref 4.0–10.3)
lymph#: 1.4 10*3/uL (ref 0.9–3.3)

## 2014-06-25 MED ORDER — SODIUM CHLORIDE 0.9 % IV SOLN
2400.0000 mg/m2 | INTRAVENOUS | Status: DC
  Administered 2014-06-25: 5200 mg via INTRAVENOUS
  Filled 2014-06-25: qty 104

## 2014-06-25 MED ORDER — ALPRAZOLAM 1 MG PO TABS: ORAL_TABLET | ORAL | Status: DC

## 2014-06-25 MED ORDER — DEXTROSE 5 % IV SOLN: Freq: Once | INTRAVENOUS | Status: DC

## 2014-06-25 MED ORDER — INFLUENZA VAC SPLIT QUAD 0.5 ML IM SUSY
0.5000 mL | PREFILLED_SYRINGE | Freq: Once | INTRAMUSCULAR | Status: DC
  Filled 2014-06-25: qty 0.5

## 2014-06-25 MED ORDER — SODIUM CHLORIDE 0.9 % IV SOLN
Freq: Once | INTRAVENOUS | Status: AC
  Administered 2014-06-25: 13:00:00 via INTRAVENOUS

## 2014-06-25 MED ORDER — DEXTROSE 5 % IV SOLN
393.5000 mg/m2 | Freq: Once | INTRAVENOUS | Status: AC
  Administered 2014-06-25: 850 mg via INTRAVENOUS
  Filled 2014-06-25: qty 42.5

## 2014-06-25 MED ORDER — FLUOROURACIL CHEMO INJECTION 2.5 GM/50ML
400.0000 mg/m2 | Freq: Once | INTRAVENOUS | Status: AC
  Administered 2014-06-25: 850 mg via INTRAVENOUS
  Filled 2014-06-25: qty 17

## 2014-06-25 MED ORDER — INFLUENZA VAC SPLIT QUAD 0.5 ML IM SUSY
0.5000 mL | PREFILLED_SYRINGE | Freq: Once | INTRAMUSCULAR | Status: AC
  Administered 2014-06-25: 0.5 mL via INTRAMUSCULAR
  Filled 2014-06-25: qty 0.5

## 2014-06-25 NOTE — Progress Notes (Signed)
Okay to tx with platelets 88 today per Dr. Benay Spice.

## 2014-06-25 NOTE — Telephone Encounter (Signed)
gv adn printed appt sched and avs for pt for OCT and NOV....sed added tx. °

## 2014-06-25 NOTE — Patient Instructions (Signed)
[facility] Discharge Instructions for Patients Receiving Chemotherapy  Today you received the following chemotherapy agents: Leucovorin, 5FU push/pump.  To help prevent nausea and vomiting after your treatment, we encourage you to take your nausea medication as prescribed by your physician.   If you develop nausea and vomiting that is not controlled by your nausea medication, call the clinic.   BELOW ARE SYMPTOMS THAT SHOULD BE REPORTED IMMEDIATELY:  *FEVER GREATER THAN 100.5 F  *CHILLS WITH OR WITHOUT FEVER  NAUSEA AND VOMITING THAT IS NOT CONTROLLED WITH YOUR NAUSEA MEDICATION  *UNUSUAL SHORTNESS OF BREATH  *UNUSUAL BRUISING OR BLEEDING  TENDERNESS IN MOUTH AND THROAT WITH OR WITHOUT PRESENCE OF ULCERS  *URINARY PROBLEMS  *BOWEL PROBLEMS  UNUSUAL RASH Items with * indicate a potential emergency and should be followed up as soon as possible.  Feel free to call the clinic you have any questions or concerns. The clinic phone number is [phone].

## 2014-06-25 NOTE — Progress Notes (Signed)
  [place] OFFICE PROGRESS NOTE   Diagnosis: Rectal cancer  INTERVAL HISTORY:   Austin Robbins returns as scheduled. He continues to have burning discomfort and numbness in the hands and feet. No change in the left groin pain that has been present since surgery he completed another cycle of 5-FU on 06/11/2014 no new complaint.  Objective:  Vital signs in last 24 hours:  Blood pressure 131/84, pulse 63, temperature 98.4 F (36.9 C), temperature source Oral, resp. rate 18, height 6' (1.829 m), weight 229 lb 9.6 oz (104.146 kg).    HEENT: No thrush or ulcers. Mild erythema of the palate and pharynx Resp: Lungs clear bilaterally Cardio: Regular rate and rhythm GI: No hepatomegaly, nontender, right lower quadrant urostomy, left lower quadrant Vascular: No leg edema  Skin: Dryness of the palms   Portacath/PICC-without erythema  Lab Results:  Lab Results  Component Value Date   WBC 6.1 06/25/2014   HGB 14.2 06/25/2014   HCT 41.2 06/25/2014   MCV 96.5 06/25/2014   PLT 88* 06/25/2014   NEUTROABS 3.8 06/25/2014      Lab Results  Component Value Date   CEA 3.1 06/11/2014    Medications: I have reviewed the patient's current medications.  Assessment/Plan: 1. Clinical stage IV (M8O,C6R,E6) adenocarcinoma of the rectum with tumor directly extending to the bladder/prostate and CT scan evidence of metastatic chest adenopathy Positive K-ras mutation.  Normal mismatch repair protein expression. Microsatellite stable  Status post low anterior resection and end colostomy with a cystoprostatectomy and colon conduit urinary diversion 11/01/2013.  Staging PET scan with hypermetabolic pelvic nodes, mediastinal nodes, left adrenal metastasis, and left upper lobe nodule.  Initiation of FOLFOX 12/25/2013.  Restaging CT 03/16/2014 after 6 cycles of FOLFOX confirmed improvement in chest/pelvic lymphadenopathy, a left upper lobe nodule, and resolution of a left adrenal nodule    Oxaliplatin deleted from cycle 8 FOLFOX 04/02/2014 secondary to neuropathy and thrombocytopenia.  Oxaliplatin held with cycle 9 FOLFOX 04/16/2014 due to neuropathy.  Cycle 10 FOLFOX 04/30/2014.  Cycle 11 FOLFOX 05/15/2014.  Cycle 12 FOLFOX 05/28/2014. Oxaliplatin held due to increased neuropathy.  Restaging CT evaluation 06/08/2014 with stable mediastinal and hilar adenopathy. Stable borderline left iliac lymph node. No new or progressive disease identified within the chest, abdomen or pelvis.  "Maintenance" 5-fluorouracil beginning 06/11/2014. 2. Microcytic anemia-likely iron deficiency, improved. He continues oral iron. 3. Gout. 4. Port-A-Cath placement 12/18/2013.  5. Anxiety. He takes Xanax as needed. 6. History of left knee pain and swelling. He was treated with a Medrol Dosepak 03/05/2014 7. Oxaliplatin neuropathy. 8. Thrombocytopenia secondary to chemotherapy. 9. Skin rash 04/30/2014-resolved with doxycycline 10. Chronic edema left lower leg/ankle.  Disposition:  Mr. Eriksson appears stable. The plan is to continue every 2 week "maintenance" 5-fluorouracil. He will return for an office visit and chemotherapy in 2 weeks. Mr. Shawler received an influenza vaccine today.  [name], MD  06/25/2014  11:34 AM

## 2014-06-27 ENCOUNTER — Ambulatory Visit (HOSPITAL_BASED_OUTPATIENT_CLINIC_OR_DEPARTMENT_OTHER): Payer: 59

## 2014-06-27 VITALS — BP 130/78 | HR 77 | Temp 98.0°F

## 2014-06-27 DIAGNOSIS — C20 Malignant neoplasm of rectum: Secondary | ICD-10-CM

## 2014-06-27 DIAGNOSIS — C7970 Secondary malignant neoplasm of unspecified adrenal gland: Secondary | ICD-10-CM

## 2014-06-27 MED ORDER — SODIUM CHLORIDE 0.9 % IJ SOLN
10.0000 mL | INTRAMUSCULAR | Status: DC | PRN
  Administered 2014-06-27: 10 mL
  Filled 2014-06-27: qty 10

## 2014-06-27 MED ORDER — HEPARIN SOD (PORK) LOCK FLUSH 100 UNIT/ML IV SOLN
500.0000 [IU] | Freq: Once | INTRAVENOUS | Status: AC | PRN
  Administered 2014-06-27: 500 [IU]
  Filled 2014-06-27: qty 5

## 2014-07-08 ENCOUNTER — Other Ambulatory Visit: Payer: Self-pay | Admitting: Oncology

## 2014-07-09 ENCOUNTER — Ambulatory Visit (HOSPITAL_BASED_OUTPATIENT_CLINIC_OR_DEPARTMENT_OTHER): Payer: 59 | Admitting: Oncology

## 2014-07-09 ENCOUNTER — Telehealth: Payer: Self-pay | Admitting: Oncology

## 2014-07-09 ENCOUNTER — Ambulatory Visit (HOSPITAL_BASED_OUTPATIENT_CLINIC_OR_DEPARTMENT_OTHER): Payer: 59

## 2014-07-09 ENCOUNTER — Other Ambulatory Visit (HOSPITAL_BASED_OUTPATIENT_CLINIC_OR_DEPARTMENT_OTHER): Payer: 59

## 2014-07-09 ENCOUNTER — Other Ambulatory Visit: Payer: Self-pay | Admitting: *Deleted

## 2014-07-09 VITALS — BP 126/80 | HR 65 | Temp 97.8°F | Resp 19 | Ht 72.0 in | Wt 232.6 lb

## 2014-07-09 DIAGNOSIS — D509 Iron deficiency anemia, unspecified: Secondary | ICD-10-CM

## 2014-07-09 DIAGNOSIS — C7970 Secondary malignant neoplasm of unspecified adrenal gland: Secondary | ICD-10-CM

## 2014-07-09 DIAGNOSIS — F419 Anxiety disorder, unspecified: Secondary | ICD-10-CM

## 2014-07-09 DIAGNOSIS — R609 Edema, unspecified: Secondary | ICD-10-CM

## 2014-07-09 DIAGNOSIS — Z5111 Encounter for antineoplastic chemotherapy: Secondary | ICD-10-CM

## 2014-07-09 DIAGNOSIS — G620 Drug-induced polyneuropathy: Secondary | ICD-10-CM

## 2014-07-09 DIAGNOSIS — C20 Malignant neoplasm of rectum: Secondary | ICD-10-CM

## 2014-07-09 DIAGNOSIS — D696 Thrombocytopenia, unspecified: Secondary | ICD-10-CM

## 2014-07-09 LAB — COMPREHENSIVE METABOLIC PANEL (CC13)
ALT: 16 U/L (ref 0–55)
AST: 16 U/L (ref 5–34)
Albumin: 3.5 g/dL (ref 3.5–5.0)
Alkaline Phosphatase: 83 U/L (ref 40–150)
Anion Gap: 8 meq/L (ref 3–11)
BUN: 13.3 mg/dL (ref 7.0–26.0)
CO2: 20 meq/L — ABNORMAL LOW (ref 22–29)
Calcium: 9 mg/dL (ref 8.4–10.4)
Chloride: 112 meq/L — ABNORMAL HIGH (ref 98–109)
Creatinine: 1 mg/dL (ref 0.7–1.3)
Glucose: 124 mg/dL (ref 70–140)
Potassium: 4 meq/L (ref 3.5–5.1)
Sodium: 141 meq/L (ref 136–145)
Total Bilirubin: 0.82 mg/dL (ref 0.20–1.20)
Total Protein: 6.8 g/dL (ref 6.4–8.3)

## 2014-07-09 LAB — CBC WITH DIFFERENTIAL/PLATELET
BASO%: 0.6 % (ref 0.0–2.0)
BASOS ABS: 0 10*3/uL (ref 0.0–0.1)
EOS%: 2.2 % (ref 0.0–7.0)
Eosinophils Absolute: 0.1 10*3/uL (ref 0.0–0.5)
HCT: 41.9 % (ref 38.4–49.9)
HEMOGLOBIN: 14 g/dL (ref 13.0–17.1)
LYMPH#: 1.4 10*3/uL (ref 0.9–3.3)
LYMPH%: 23.8 % (ref 14.0–49.0)
MCH: 33.1 pg (ref 27.2–33.4)
MCHC: 33.5 g/dL (ref 32.0–36.0)
MCV: 98.8 fL — ABNORMAL HIGH (ref 79.3–98.0)
MONO#: 0.6 10*3/uL (ref 0.1–0.9)
MONO%: 10.2 % (ref 0.0–14.0)
NEUT%: 63.2 % (ref 39.0–75.0)
NEUTROS ABS: 3.9 10*3/uL (ref 1.5–6.5)
Platelets: 115 10*3/uL — ABNORMAL LOW (ref 140–400)
RBC: 4.24 10*6/uL (ref 4.20–5.82)
RDW: 16 % — ABNORMAL HIGH (ref 11.0–14.6)
WBC: 6.1 10*3/uL (ref 4.0–10.3)

## 2014-07-09 MED ORDER — LEUCOVORIN CALCIUM INJECTION 350 MG
393.5000 mg/m2 | Freq: Once | INTRAVENOUS | Status: AC
  Administered 2014-07-09: 850 mg via INTRAVENOUS
  Filled 2014-07-09: qty 42.5

## 2014-07-09 MED ORDER — SODIUM CHLORIDE 0.9 % IV SOLN
2400.0000 mg/m2 | INTRAVENOUS | Status: DC
  Administered 2014-07-09: 5200 mg via INTRAVENOUS
  Filled 2014-07-09: qty 104

## 2014-07-09 MED ORDER — DEXTROSE 5 % IV SOLN: Freq: Once | INTRAVENOUS | Status: DC

## 2014-07-09 MED ORDER — FLUOROURACIL CHEMO INJECTION 2.5 GM/50ML
400.0000 mg/m2 | Freq: Once | INTRAVENOUS | Status: AC
  Administered 2014-07-09: 850 mg via INTRAVENOUS
  Filled 2014-07-09: qty 17

## 2014-07-09 NOTE — Telephone Encounter (Signed)
gv and printed appt sched adn avs for pt for NOV....sed added tx.

## 2014-07-09 NOTE — Progress Notes (Signed)
  [facility] OFFICE PROGRESS NOTE   Diagnosis: colon cancer  INTERVAL HISTORY:   Austin Robbins returns as scheduled. He completed another cycle of 5-fluorouracil on 06/25/2014. He continues to have numbness in the hands and pain in the toes. The pain is worse after prolonged ambulation. Stable pain in the left groin. No other complaint.  Objective:  Vital signs in last 24 hours:  Blood pressure 126/80, pulse 65, temperature 97.8 F (36.6 C), temperature source Oral, resp. rate 19, height 6' (1.829 m), weight 232 lb 9.6 oz (105.507 kg).    HEENT: no thrush or ulcers, mild erythema at the buccal mucosa and palate Resp: lungs clear bilaterally Cardio: regular rate and rhythm GI: no hepatomegaly, nontender Vascular: trace edema at the low leg and ankles bilaterally  Skin:palms and soles without erythema   Portacath/PICC-without erythema  Lab Results:  Lab Results  Component Value Date   WBC 6.1 07/09/2014   HGB 14.0 07/09/2014   HCT 41.9 07/09/2014   MCV 98.8* 07/09/2014   PLT 115* 07/09/2014   NEUTROABS 3.9 07/09/2014    Medications: I have reviewed the patient's current medications.  Assessment/Plan: 1. Clinical stage IV (Y7C,W2B,J6) adenocarcinoma of the rectum with tumor directly extending to the bladder/prostate and CT scan evidence of metastatic chest adenopathy  Positive K-ras mutation.   Normal mismatch repair protein expression. Microsatellite stable   Status post low anterior resection and end colostomy with a cystoprostatectomy and colon conduit urinary diversion 11/01/2013.   Staging PET scan with hypermetabolic pelvic nodes, mediastinal nodes, left adrenal metastasis, and left upper lobe nodule.   Initiation of FOLFOX 12/25/2013.   Restaging CT 03/16/2014 after 6 cycles of FOLFOX confirmed improvement in chest/pelvic lymphadenopathy, a left upper lobe nodule, and resolution of a left adrenal nodule   Oxaliplatin deleted from cycle 8  FOLFOX 04/02/2014 secondary to neuropathy and thrombocytopenia.   Oxaliplatin held with cycle 9 FOLFOX 04/16/2014 due to neuropathy.   Cycle 10 FOLFOX 04/30/2014.   Cycle 11 FOLFOX 05/15/2014.   Cycle 12 FOLFOX 05/28/2014. Oxaliplatin held due to increased neuropathy.   Restaging CT evaluation 06/08/2014 with stable mediastinal and hilar adenopathy. Stable borderline left iliac lymph node. No new or progressive disease identified within the chest, abdomen or pelvis.   "Maintenance" 5-fluorouracil beginning 06/11/2014. 2. Microcytic anemia-likely iron deficiency, improved. He continues oral iron. 3. Gout. 4. Port-A-Cath placement 12/18/2013.  5. Anxiety. He takes Xanax as needed. 6. History of left knee pain and swelling. He was treated with a Medrol Dosepak 03/05/2014 7. Oxaliplatin neuropathy.persistent with pain in the toes 8. Thrombocytopenia secondary to chemotherapy. 9. Skin rash 04/30/2014-resolved with doxycycline 10. Chronic edema left lower leg/ankle.   Disposition:  Austin Robbins appears stable. He has persistent neuropathy symptoms with pain in the toes. He will begin a trial of Cymbalta. The plan is to continue every 2 week 5 fluorouracil/leucovorin. He will return for an office visit in 2 weeks.  Austin Coder, MD  07/09/2014  11:08 AM

## 2014-07-09 NOTE — Patient Instructions (Signed)
[facility] Discharge Instructions for Patients Receiving Chemotherapy  Today you received the following chemotherapy agents Leukovorin and 5FU  To help prevent nausea and vomiting after your treatment, we encourage you to take your nausea medication as prescribed.   If you develop nausea and vomiting that is not controlled by your nausea medication, call the clinic.   BELOW ARE SYMPTOMS THAT SHOULD BE REPORTED IMMEDIATELY:  *FEVER GREATER THAN 100.5 F  *CHILLS WITH OR WITHOUT FEVER  NAUSEA AND VOMITING THAT IS NOT CONTROLLED WITH YOUR NAUSEA MEDICATION  *UNUSUAL SHORTNESS OF BREATH  *UNUSUAL BRUISING OR BLEEDING  TENDERNESS IN MOUTH AND THROAT WITH OR WITHOUT PRESENCE OF ULCERS  *URINARY PROBLEMS  *BOWEL PROBLEMS  UNUSUAL RASH Items with * indicate a potential emergency and should be followed up as soon as possible.  Feel free to call the clinic you have any questions or concerns. The clinic phone number is [phone].

## 2014-07-10 ENCOUNTER — Other Ambulatory Visit: Payer: Self-pay | Admitting: *Deleted

## 2014-07-10 MED ORDER — DULOXETINE HCL 30 MG PO CPEP: 30.0000 mg | ORAL_CAPSULE | Freq: Every day | ORAL | Status: DC

## 2014-07-11 ENCOUNTER — Ambulatory Visit (HOSPITAL_BASED_OUTPATIENT_CLINIC_OR_DEPARTMENT_OTHER): Payer: 59

## 2014-07-11 DIAGNOSIS — C7970 Secondary malignant neoplasm of unspecified adrenal gland: Secondary | ICD-10-CM

## 2014-07-11 DIAGNOSIS — C20 Malignant neoplasm of rectum: Secondary | ICD-10-CM

## 2014-07-11 MED ORDER — HEPARIN SOD (PORK) LOCK FLUSH 100 UNIT/ML IV SOLN
500.0000 [IU] | Freq: Once | INTRAVENOUS | Status: AC | PRN
  Administered 2014-07-11: 500 [IU]
  Filled 2014-07-11: qty 5

## 2014-07-11 MED ORDER — SODIUM CHLORIDE 0.9 % IJ SOLN
10.0000 mL | INTRAMUSCULAR | Status: DC | PRN
  Administered 2014-07-11: 10 mL
  Filled 2014-07-11: qty 10

## 2014-07-11 NOTE — Patient Instructions (Signed)

## 2014-07-22 ENCOUNTER — Other Ambulatory Visit: Payer: Self-pay | Admitting: Oncology

## 2014-07-23 ENCOUNTER — Telehealth: Payer: Self-pay | Admitting: Oncology

## 2014-07-23 ENCOUNTER — Other Ambulatory Visit: Payer: Self-pay

## 2014-07-23 ENCOUNTER — Ambulatory Visit (HOSPITAL_BASED_OUTPATIENT_CLINIC_OR_DEPARTMENT_OTHER): Payer: 59

## 2014-07-23 ENCOUNTER — Other Ambulatory Visit (HOSPITAL_BASED_OUTPATIENT_CLINIC_OR_DEPARTMENT_OTHER): Payer: 59

## 2014-07-23 ENCOUNTER — Ambulatory Visit (HOSPITAL_BASED_OUTPATIENT_CLINIC_OR_DEPARTMENT_OTHER): Payer: 59 | Admitting: Nurse Practitioner

## 2014-07-23 VITALS — BP 151/81 | HR 62 | Temp 98.4°F | Resp 18 | Ht 72.0 in | Wt 231.8 lb

## 2014-07-23 DIAGNOSIS — R609 Edema, unspecified: Secondary | ICD-10-CM

## 2014-07-23 DIAGNOSIS — D696 Thrombocytopenia, unspecified: Secondary | ICD-10-CM

## 2014-07-23 DIAGNOSIS — Z23 Encounter for immunization: Secondary | ICD-10-CM

## 2014-07-23 DIAGNOSIS — G620 Drug-induced polyneuropathy: Secondary | ICD-10-CM

## 2014-07-23 DIAGNOSIS — C20 Malignant neoplasm of rectum: Secondary | ICD-10-CM

## 2014-07-23 DIAGNOSIS — Z5111 Encounter for antineoplastic chemotherapy: Secondary | ICD-10-CM

## 2014-07-23 DIAGNOSIS — D509 Iron deficiency anemia, unspecified: Secondary | ICD-10-CM

## 2014-07-23 DIAGNOSIS — C7970 Secondary malignant neoplasm of unspecified adrenal gland: Secondary | ICD-10-CM

## 2014-07-23 LAB — CBC WITH DIFFERENTIAL/PLATELET
BASO%: 0.7 % (ref 0.0–2.0)
BASOS ABS: 0 10*3/uL (ref 0.0–0.1)
EOS%: 2 % (ref 0.0–7.0)
Eosinophils Absolute: 0.1 10*3/uL (ref 0.0–0.5)
HCT: 41.9 % (ref 38.4–49.9)
HEMOGLOBIN: 14.1 g/dL (ref 13.0–17.1)
LYMPH%: 30.4 % (ref 14.0–49.0)
MCH: 33.5 pg — AB (ref 27.2–33.4)
MCHC: 33.7 g/dL (ref 32.0–36.0)
MCV: 99.4 fL — ABNORMAL HIGH (ref 79.3–98.0)
MONO#: 0.6 10*3/uL (ref 0.1–0.9)
MONO%: 9.9 % (ref 0.0–14.0)
NEUT%: 57 % (ref 39.0–75.0)
NEUTROS ABS: 3.3 10*3/uL (ref 1.5–6.5)
PLATELETS: 121 10*3/uL — AB (ref 140–400)
RBC: 4.22 10*6/uL (ref 4.20–5.82)
RDW: 15.4 % — ABNORMAL HIGH (ref 11.0–14.6)
WBC: 5.9 10*3/uL (ref 4.0–10.3)
lymph#: 1.8 10*3/uL (ref 0.9–3.3)

## 2014-07-23 MED ORDER — ALPRAZOLAM 1 MG PO TABS: ORAL_TABLET | ORAL | Status: DC

## 2014-07-23 MED ORDER — DEXTROSE 5 % IV SOLN: Freq: Once | INTRAVENOUS | Status: DC

## 2014-07-23 MED ORDER — LEUCOVORIN CALCIUM INJECTION 350 MG
393.5000 mg/m2 | Freq: Once | INTRAMUSCULAR | Status: AC
  Administered 2014-07-23: 850 mg via INTRAVENOUS
  Filled 2014-07-23: qty 42.5

## 2014-07-23 MED ORDER — FLUOROURACIL CHEMO INJECTION 2.5 GM/50ML
400.0000 mg/m2 | Freq: Once | INTRAVENOUS | Status: AC
Start: 2014-07-23 — End: 2014-07-23
  Administered 2014-07-23: 850 mg via INTRAVENOUS
  Filled 2014-07-23: qty 17

## 2014-07-23 MED ORDER — SODIUM CHLORIDE 0.9 % IV SOLN
2400.0000 mg/m2 | INTRAVENOUS | Status: DC
  Administered 2014-07-23: 5200 mg via INTRAVENOUS
  Filled 2014-07-23: qty 104

## 2014-07-23 NOTE — Patient Instructions (Signed)
[facility] Discharge Instructions for Patients Receiving Chemotherapy  Today you received the following chemotherapy agents: leucovorin and 5FU   To help prevent nausea and vomiting after your treatment, we encourage you to take your nausea medication as prescribed.    If you develop nausea and vomiting that is not controlled by your nausea medication, call the clinic.   BELOW ARE SYMPTOMS THAT SHOULD BE REPORTED IMMEDIATELY:  *FEVER GREATER THAN 100.5 F  *CHILLS WITH OR WITHOUT FEVER  NAUSEA AND VOMITING THAT IS NOT CONTROLLED WITH YOUR NAUSEA MEDICATION  *UNUSUAL SHORTNESS OF BREATH  *UNUSUAL BRUISING OR BLEEDING  TENDERNESS IN MOUTH AND THROAT WITH OR WITHOUT PRESENCE OF ULCERS  *URINARY PROBLEMS  *BOWEL PROBLEMS  UNUSUAL RASH Items with * indicate a potential emergency and should be followed up as soon as possible.  Feel free to call the clinic you have any questions or concerns. The clinic phone number is [phone].    

## 2014-07-23 NOTE — Telephone Encounter (Signed)
gv pt appt schedule for nov/dec.

## 2014-07-23 NOTE — Progress Notes (Signed)
  [place] OFFICE PROGRESS NOTE   Diagnosis:  Rectal cancer  INTERVAL HISTORY:   Austin Robbins returns as scheduled. He continues every 2 week 5 fluorouracil. He was last treated 07/09/2014. No significant nausea/vomiting. He had a single mouth sore which has resolved. No diarrhea. Stable pain at the left groin. He continues to have a good appetite. He has persistent numbness in the feet greater than hands. He has noted some improvement in the associated pain since beginning Cymbalta.  Objective:  Vital signs in last 24 hours:  Blood pressure 151/81, pulse 62, temperature 98.4 F (36.9 C), temperature source Oral, resp. rate 18, height 6' (1.829 m), weight 231 lb 12.8 oz (105.144 kg), SpO2 99 %.    HEENT: no thrush or ulcers. Resp: lungs clear bilaterally. Cardio: regular rate and rhythm. GI: abdomen soft and nontender. No hepatomegaly. Vascular: trace lower leg edema bilaterally left greater than right.  Port-A-Cath without erythema.    Lab Results:  Lab Results  Component Value Date   WBC 5.9 07/23/2014   HGB 14.1 07/23/2014   HCT 41.9 07/23/2014   MCV 99.4* 07/23/2014   PLT 121* 07/23/2014   NEUTROABS 3.3 07/23/2014    Imaging:  No results found.  Medications: I have reviewed the patient's current medications.  Assessment/Plan: 1. Clinical stage IV (Z0S,P2Z,R0) adenocarcinoma of the rectum with tumor directly extending to the bladder/prostate and CT scan evidence of metastatic chest adenopathy  Positive K-ras mutation.   Normal mismatch repair protein expression. Microsatellite stable   Status post low anterior resection and end colostomy with a cystoprostatectomy and colon conduit urinary diversion 11/01/2013.   Staging PET scan with hypermetabolic pelvic nodes, mediastinal nodes, left adrenal metastasis, and left upper lobe nodule.   Initiation of FOLFOX 12/25/2013.   Restaging CT 03/16/2014 after 6 cycles of FOLFOX confirmed  improvement in chest/pelvic lymphadenopathy, a left upper lobe nodule, and resolution of a left adrenal nodule   Oxaliplatin deleted from cycle 8 FOLFOX 04/02/2014 secondary to neuropathy and thrombocytopenia.   Oxaliplatin held with cycle 9 FOLFOX 04/16/2014 due to neuropathy.   Cycle 10 FOLFOX 04/30/2014.   Cycle 11 FOLFOX 05/15/2014.   Cycle 12 FOLFOX 05/28/2014. Oxaliplatin held due to increased neuropathy.   Restaging CT evaluation 06/08/2014 with stable mediastinal and hilar adenopathy. Stable borderline left iliac lymph node. No new or progressive disease identified within the chest, abdomen or pelvis.   "Maintenance" 5-fluorouracil beginning 06/11/2014. 2. Microcytic anemia-likely iron deficiency, improved. He continues oral iron. 3. Gout. 4. Port-A-Cath placement 12/18/2013.  5. Anxiety. He takes Xanax as needed. 6. History of left knee pain and swelling. 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 Trial of Cymbalta initiated 07/09/2014. 8. Thrombocytopenia secondary to chemotherapy. 9. Skin rash 04/30/2014-resolved with doxycycline 10. Chronic edema left lower leg/ankle.   Disposition:Austin Robbins appears stable. Plan to continue every 2 week 5 fluorouracil/leucovorin. He will return for a followup visit and treatment in 2 weeks. He will contact the office in the interim with any problems.  Plan reviewed with Dr. Benay Spice.    [name] ANP/GNP-BC   07/23/2014  10:33 AM

## 2014-07-25 ENCOUNTER — Ambulatory Visit (HOSPITAL_BASED_OUTPATIENT_CLINIC_OR_DEPARTMENT_OTHER): Payer: 59

## 2014-07-25 DIAGNOSIS — C7970 Secondary malignant neoplasm of unspecified adrenal gland: Secondary | ICD-10-CM

## 2014-07-25 DIAGNOSIS — C20 Malignant neoplasm of rectum: Secondary | ICD-10-CM

## 2014-07-25 MED ORDER — HEPARIN SOD (PORK) LOCK FLUSH 100 UNIT/ML IV SOLN
500.0000 [IU] | Freq: Once | INTRAVENOUS | Status: AC | PRN
  Administered 2014-07-25: 500 [IU]
  Filled 2014-07-25: qty 5

## 2014-07-25 MED ORDER — SODIUM CHLORIDE 0.9 % IJ SOLN
10.0000 mL | INTRAMUSCULAR | Status: DC | PRN
  Administered 2014-07-25: 10 mL
  Filled 2014-07-25: qty 10

## 2014-08-01 IMAGING — CT CT CHEST W/ CM
2 of 5 series · 14 of 46 positions shown, 16 images · IV contrast (OMNIPAQUE)
Comparison: Most recent prior imaging includes PET-CT dated
12/11/2013 and CT chest/abdomen/pelvis dated 11/01/2013

CLINICAL DATA: Metastatic rectal cancer with ongoing chemotherapy.
Evaluate for treatment response.

EXAM:
CT CHEST, ABDOMEN, AND PELVIS WITH CONTRAST
TECHNIQUE: Multidetector CT imaging of the chest, abdomen and pelvis was
performed following the standard protocol during bolus
administration of intravenous contrast.
CONTRAST:  100mL OMNIPAQUE IOHEXOL 300 MG/ML  SOLN

[Series 2: cap with st · axial · 0.84mm/px · z∈[+822,+1417]mm · 11 of 135 slices shown, 13 images]
[im 8/135  soft-tissue]
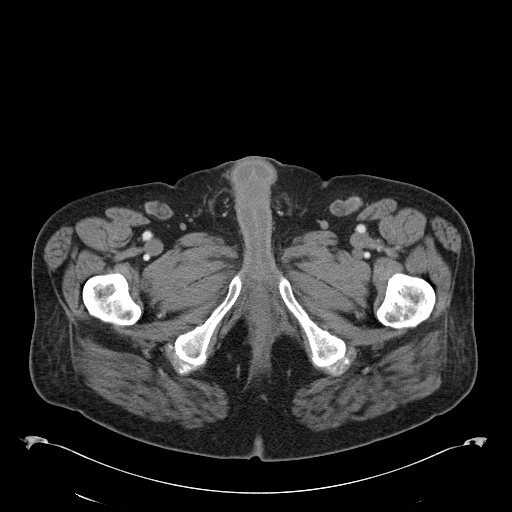
[im 8/135  bone]
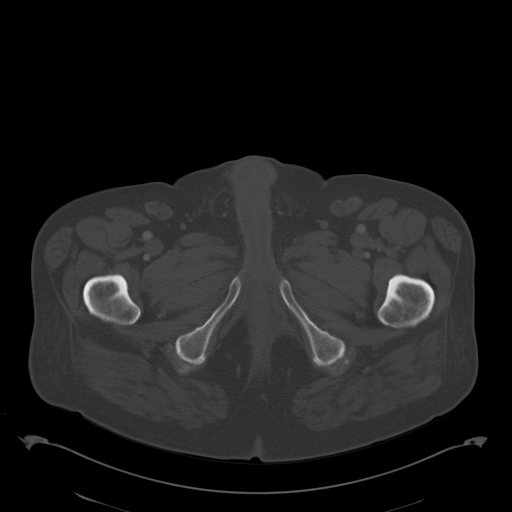
[im 23/135  soft-tissue]
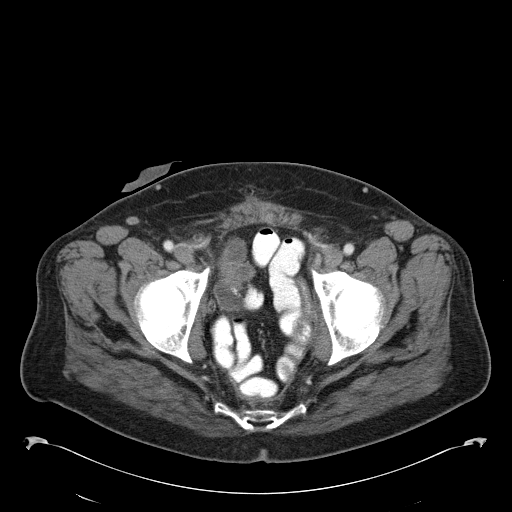
[im 30/135  soft-tissue]
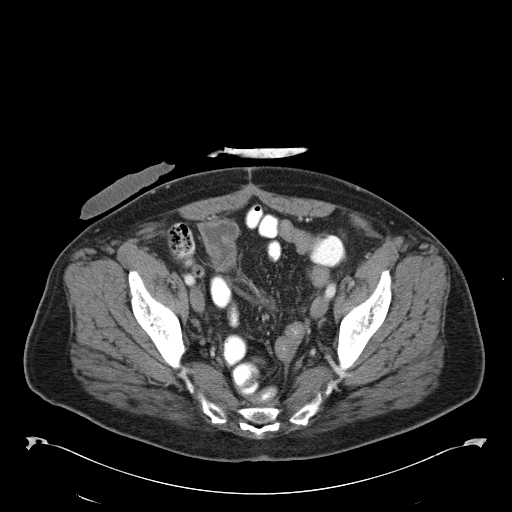
[im 45/135  soft-tissue]
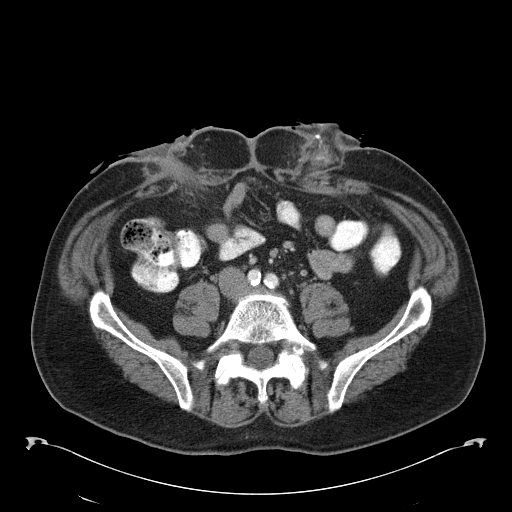
[im 53/135  soft-tissue]
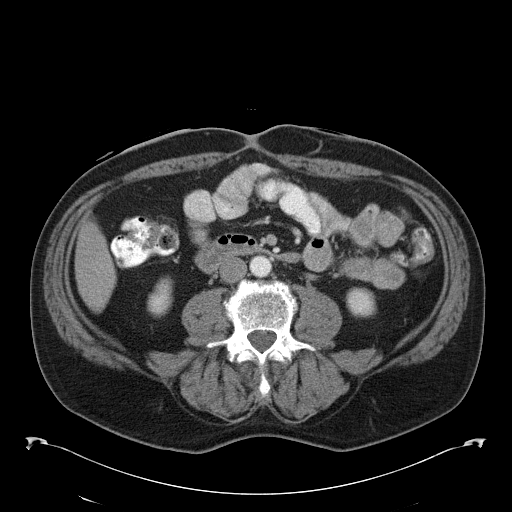
[im 68/135  soft-tissue]
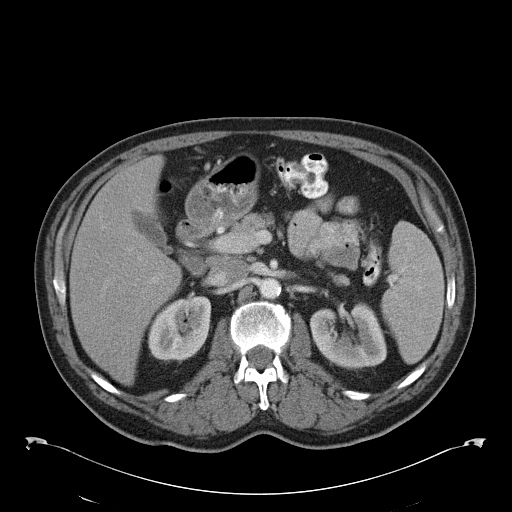
[im 82/135  soft-tissue]
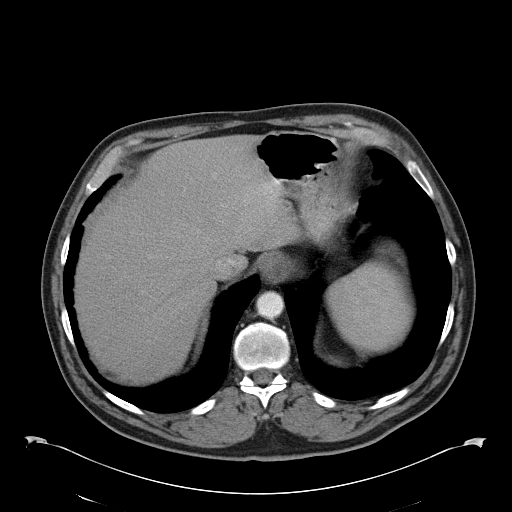
[im 90/135  soft-tissue]
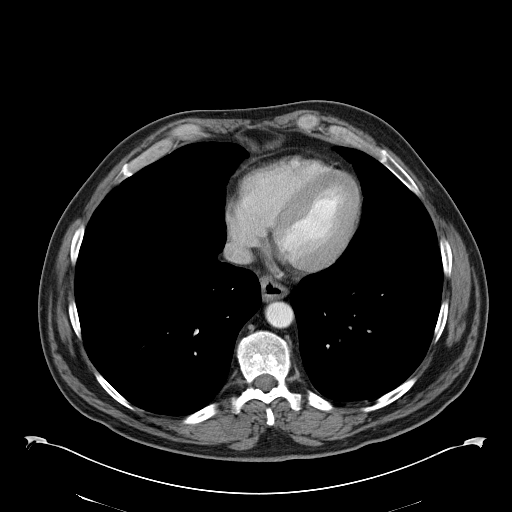
[im 105/135  soft-tissue]
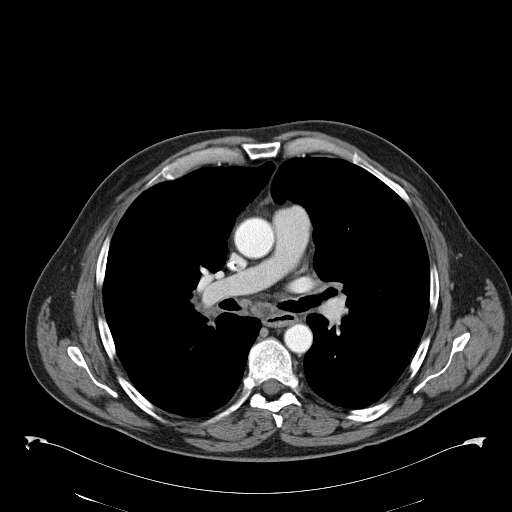
[im 105/135  bone]
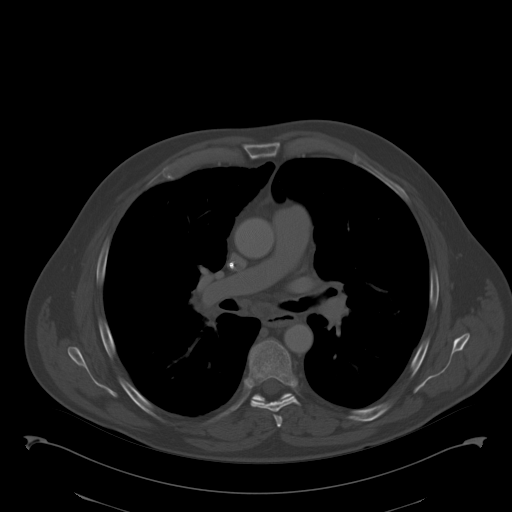
[im 112/135  soft-tissue]
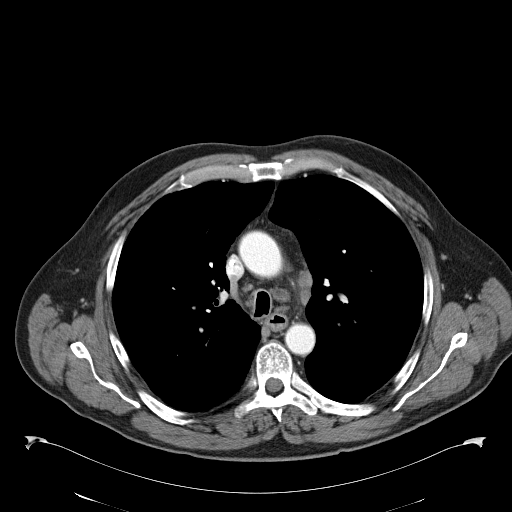
[im 127/135  soft-tissue]
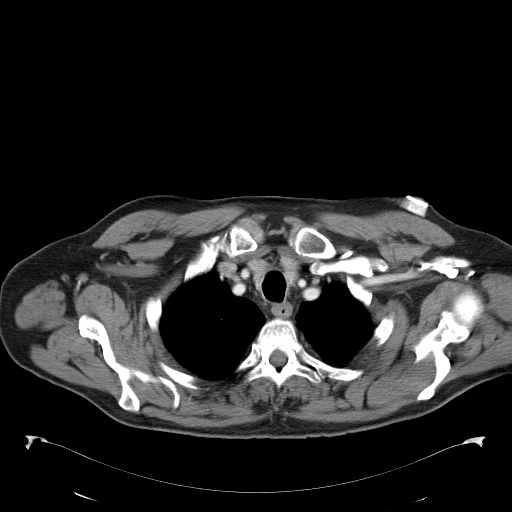

[Series 602: <mpr thick range> · coronal · 1.32mm/px · 3 of 101 slices shown]
[im 34/101  soft-tissue]
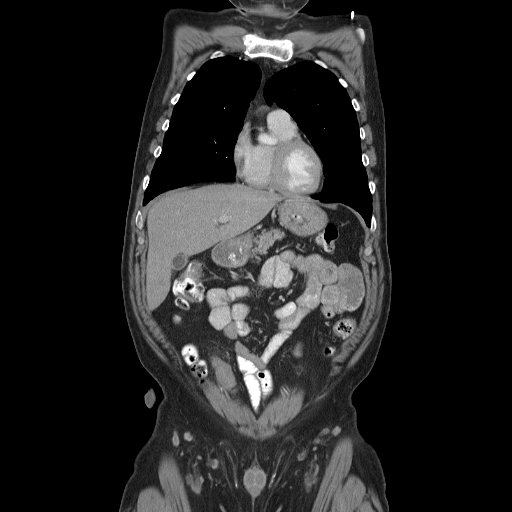
[im 45/101  soft-tissue]
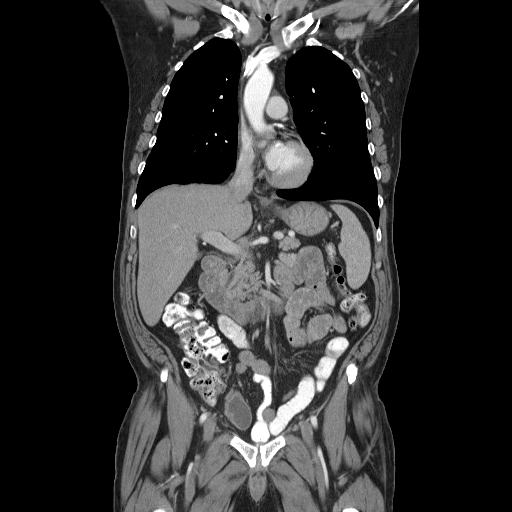
[im 56/101  soft-tissue]
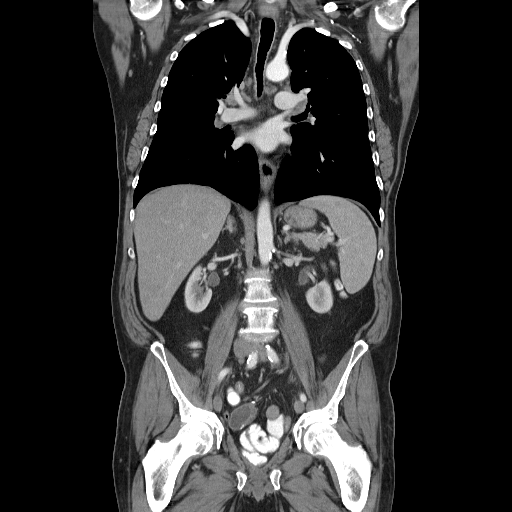

[14 of 46 positions shown; findings below may reference images not displayed]

FINDINGS: CT CHEST FINDINGS

Mediastinum: Unremarkable appearance of the thyroid gland. A
significant interval reduction in the size of mediastinal adenopathy
compared to the prior PET-CT. The index prevascular nodal
conglomerate previously measured at 4.8 x 3.5 cm is now identified
as to adjacent lymph nodes measuring 2.7 x 1.1 cm. Mild retrocrural
adenopathy which was previously not hypermetabolic is essentially
unchanged at 8 mm in short axis. Essentially unremarkable thoracic
esophagus.

Heart/Vascular: Conventional 3 vessel arch anatomy. Cardiac size is
within normal limits. Atherosclerotic calcifications present within
the coronary arteries. No pericardial effusion. No large central
pulmonary embolus. Left subclavian approach single-lumen port
catheter. Catheter tip terminates in the distal SVC.

Lungs/Pleura: Previously identified 9 mm left upper lobe pulmonary
nodule is barely identifiable as a small 3 mm nodule (image 26,
series 5). No new pulmonary nodule identified.

Bones/Soft Tissues: No acute fracture or aggressive appearing lytic
or blastic osseous lesion.

CT ABDOMEN AND PELVIS FINDINGS

Abdomen: Unremarkable CT appearance of the stomach, duodenum, spleen
and pancreas. The previously identified left adrenal nodule is no
longer visible. The right adrenal gland remains unremarkable. Normal
hepatic contour and morphology. No hepatic lesion identified.
Gallbladder is unremarkable. No intra or extrahepatic biliary ductal
dilatation. Widely patent portal veins. Unremarkable appearance of
the bilateral kidneys. No focal solid lesion, hydronephrosis or
nephrolithiasis.

Extensive post surgical changes including low anterior resection
with left lower quadrant diverting end colostomy and cystectomy with
right lower quadrant diverting ureterosigmoidostomy. The previously
identified left internal iliac station lymph node has almost
completely resolved decreasing in size from 1.9 x 1.9 cm to 0.5 x
0.7 cm. The previously identified hypermetabolic soft tissue
anterior to the sacrum is barely identifiable on the present exam.
Left common iliac nodes are also difficult to identify. Similar
appearance of a mild strandy soft tissue along the left pelvic
sidewall in the region of the diverted left ureter and ureteral
sigmoid anastomosis. No definite enhancing soft tissue or
nodularity. No free fluid.

Pelvis: Surgical changes of low anterior resection and cystectomy as
described above. Stable prominent superficial inguinal adenopathy.
The largest left superficial inguinal node measures 2.8 x 1.2 cm,
essentially unchanged compared to 2.6 x 1.2 cm on the prior. The
largest right inguinal node measures 2.2 x 1.2 cm, incrementally
enlarged compared to 1.9 x 1.2 cm previously.

Bones/Soft Tissues: No acute fracture or aggressive appearing lytic
or blastic osseous lesion. Multilevel degenerative disc disease.

Vascular: Atherosclerotic vascular disease without significant
stenosis or aneurysmal dilatation.
IMPRESSION: 1. Marked interval response to therapy including significant
improvement in mediastinal and hilar adenopathy, resolution of left
adrenal nodule, resolving left upper lobe pulmonary nodule, and a
nearly resolved left internal iliac station lymph node.
Additionally, the previously identified hypermetabolic soft tissue
anterior to the sacrum and small hypermetabolic nodes and the left
common iliac nodal station are now to subtle to identify
definitively.
2. No new metastatic disease identified.
3. Similar to incrementally enlarged superficial inguinal lymph
nodes bilaterally and are likely reactive.
4. Stable surgical changes of low anterior resection, cystectomy,
left lower quadrant end colostomy and right lower quadrant diverting
ureterosigmoidostomy without evidence of interval complication.
5. Additional ancillary findings as above without significant
interval change.

## 2014-08-03 ENCOUNTER — Other Ambulatory Visit: Payer: Self-pay | Admitting: Oncology

## 2014-08-06 ENCOUNTER — Other Ambulatory Visit: Payer: Self-pay | Admitting: *Deleted

## 2014-08-06 ENCOUNTER — Ambulatory Visit (HOSPITAL_BASED_OUTPATIENT_CLINIC_OR_DEPARTMENT_OTHER): Payer: 59

## 2014-08-06 ENCOUNTER — Telehealth: Payer: Self-pay | Admitting: *Deleted

## 2014-08-06 ENCOUNTER — Telehealth: Payer: Self-pay | Admitting: Oncology

## 2014-08-06 ENCOUNTER — Ambulatory Visit (HOSPITAL_BASED_OUTPATIENT_CLINIC_OR_DEPARTMENT_OTHER): Payer: 59 | Admitting: Oncology

## 2014-08-06 ENCOUNTER — Other Ambulatory Visit (HOSPITAL_BASED_OUTPATIENT_CLINIC_OR_DEPARTMENT_OTHER): Payer: 59

## 2014-08-06 VITALS — BP 135/82 | HR 59 | Temp 97.7°F | Resp 18 | Ht 72.0 in | Wt 229.6 lb

## 2014-08-06 DIAGNOSIS — R609 Edema, unspecified: Secondary | ICD-10-CM

## 2014-08-06 DIAGNOSIS — C7970 Secondary malignant neoplasm of unspecified adrenal gland: Secondary | ICD-10-CM

## 2014-08-06 DIAGNOSIS — D696 Thrombocytopenia, unspecified: Secondary | ICD-10-CM

## 2014-08-06 DIAGNOSIS — C20 Malignant neoplasm of rectum: Secondary | ICD-10-CM

## 2014-08-06 DIAGNOSIS — G620 Drug-induced polyneuropathy: Secondary | ICD-10-CM

## 2014-08-06 DIAGNOSIS — Z5111 Encounter for antineoplastic chemotherapy: Secondary | ICD-10-CM

## 2014-08-06 DIAGNOSIS — F419 Anxiety disorder, unspecified: Secondary | ICD-10-CM

## 2014-08-06 DIAGNOSIS — D509 Iron deficiency anemia, unspecified: Secondary | ICD-10-CM

## 2014-08-06 LAB — CBC WITH DIFFERENTIAL/PLATELET
BASO%: 0.7 % (ref 0.0–2.0)
BASOS ABS: 0 10*3/uL (ref 0.0–0.1)
EOS%: 2.7 % (ref 0.0–7.0)
Eosinophils Absolute: 0.2 10*3/uL (ref 0.0–0.5)
HCT: 42.7 % (ref 38.4–49.9)
HEMOGLOBIN: 14.4 g/dL (ref 13.0–17.1)
LYMPH%: 31.4 % (ref 14.0–49.0)
MCH: 33.5 pg — ABNORMAL HIGH (ref 27.2–33.4)
MCHC: 33.8 g/dL (ref 32.0–36.0)
MCV: 98.9 fL — AB (ref 79.3–98.0)
MONO#: 0.5 10*3/uL (ref 0.1–0.9)
MONO%: 7.9 % (ref 0.0–14.0)
NEUT#: 3.4 10*3/uL (ref 1.5–6.5)
NEUT%: 57.3 % (ref 39.0–75.0)
PLATELETS: 128 10*3/uL — AB (ref 140–400)
RBC: 4.31 10*6/uL (ref 4.20–5.82)
RDW: 15.4 % — ABNORMAL HIGH (ref 11.0–14.6)
WBC: 5.9 10*3/uL (ref 4.0–10.3)
lymph#: 1.8 10*3/uL (ref 0.9–3.3)

## 2014-08-06 LAB — COMPREHENSIVE METABOLIC PANEL (CC13)
ALK PHOS: 86 U/L (ref 40–150)
ALT: 12 U/L (ref 0–55)
AST: 15 U/L (ref 5–34)
Albumin: 3.6 g/dL (ref 3.5–5.0)
Anion Gap: 8 mEq/L (ref 3–11)
BUN: 11.1 mg/dL (ref 7.0–26.0)
CALCIUM: 9 mg/dL (ref 8.4–10.4)
CHLORIDE: 109 meq/L (ref 98–109)
CO2: 22 mEq/L (ref 22–29)
CREATININE: 1 mg/dL (ref 0.7–1.3)
Glucose: 135 mg/dl (ref 70–140)
Potassium: 4 mEq/L (ref 3.5–5.1)
Sodium: 140 mEq/L (ref 136–145)
Total Bilirubin: 0.48 mg/dL (ref 0.20–1.20)
Total Protein: 6.7 g/dL (ref 6.4–8.3)

## 2014-08-06 MED ORDER — OXYCODONE-ACETAMINOPHEN 5-325 MG PO TABS: 1.0000 | ORAL_TABLET | ORAL | Status: DC | PRN

## 2014-08-06 MED ORDER — COLCRYS 0.6 MG PO TABS: ORAL_TABLET | ORAL | Status: DC

## 2014-08-06 MED ORDER — OXYCODONE-ACETAMINOPHEN 5-325 MG PO TABS: 1.0000 | ORAL_TABLET | Freq: Four times a day (QID) | ORAL | Status: DC | PRN

## 2014-08-06 MED ORDER — LEUCOVORIN CALCIUM INJECTION 350 MG
393.5000 mg/m2 | Freq: Once | INTRAMUSCULAR | Status: AC
  Administered 2014-08-06: 850 mg via INTRAVENOUS
  Filled 2014-08-06: qty 42.5

## 2014-08-06 MED ORDER — SODIUM CHLORIDE 0.9 % IV SOLN
Freq: Once | INTRAVENOUS | Status: AC
  Administered 2014-08-06: 10:00:00 via INTRAVENOUS

## 2014-08-06 MED ORDER — LIDOCAINE-PRILOCAINE 2.5-2.5 % EX CREA: 1.0000 "application " | TOPICAL_CREAM | CUTANEOUS | Status: DC | PRN

## 2014-08-06 MED ORDER — FLUOROURACIL CHEMO INJECTION 5 GM/100ML
2400.0000 mg/m2 | INTRAVENOUS | Status: DC
  Administered 2014-08-06: 5200 mg via INTRAVENOUS
  Filled 2014-08-06: qty 104

## 2014-08-06 MED ORDER — FLUOROURACIL CHEMO INJECTION 2.5 GM/50ML
400.0000 mg/m2 | Freq: Once | INTRAVENOUS | Status: AC
Start: 2014-08-06 — End: 2014-08-06
  Administered 2014-08-06: 850 mg via INTRAVENOUS
  Filled 2014-08-06: qty 17

## 2014-08-06 NOTE — Telephone Encounter (Signed)
Gave avs & cal for Dec/Jan. Sent mess to sch tx °

## 2014-08-06 NOTE — Progress Notes (Signed)
  [place] [facility] OFFICE PROGRESS NOTE   Diagnosis: Colon cancer  INTERVAL HISTORY:   Austin Robbins returns as scheduled. He continues every 2 week 5 fluorouracil. The left groin pain is partially improved. The numbness in his fingers has improved. He continues to have numbness in the toes.  Objective:  Vital signs in last 24 hours:  Blood pressure 135/82, pulse 59, temperature 97.7 F (36.5 C), temperature source Oral, resp. rate 18, height 6' (1.829 m), weight 229 lb 9.6 oz (104.146 kg).    HEENT: No thrush or ulcers Resp: Lungs clear bilaterally Cardio: Regular rate and rhythm GI: No hepatomegaly, right lower quadrant urostomy, left lower quadrant colostomy, left groin and upper thigh without mass. Mild tenderness in the left groin near the lateral aspect of the pubic bone Vascular: No leg edema   Portacath/PICC-without erythema  Lab Results:  Lab Results  Component Value Date   WBC 5.9 08/06/2014   HGB 14.4 08/06/2014   HCT 42.7 08/06/2014   MCV 98.9* 08/06/2014   PLT 128* 08/06/2014   NEUTROABS 3.4 08/06/2014     Medications: I have reviewed the patient's current medications.  Assessment/Plan: 1. Clinical stage IV (E9M,M7W,K0) adenocarcinoma of the rectum with tumor directly extending to the bladder/prostate and CT scan evidence of metastatic chest adenopathy  Positive K-ras mutation.   Normal mismatch repair protein expression. Microsatellite stable   Status post low anterior resection and end colostomy with a cystoprostatectomy and colon conduit urinary diversion 11/01/2013.   Staging PET scan with hypermetabolic pelvic nodes, mediastinal nodes, left adrenal metastasis, and left upper lobe nodule.   Initiation of FOLFOX 12/25/2013.   Restaging CT 03/16/2014 after 6 cycles of FOLFOX confirmed improvement in chest/pelvic lymphadenopathy, a left upper lobe nodule, and resolution of a left adrenal nodule   Oxaliplatin deleted from cycle 8  FOLFOX 04/02/2014 secondary to neuropathy and thrombocytopenia.   Oxaliplatin held with cycle 9 FOLFOX 04/16/2014 due to neuropathy.   Cycle 10 FOLFOX 04/30/2014.   Cycle 11 FOLFOX 05/15/2014.   Cycle 12 FOLFOX 05/28/2014. Oxaliplatin held due to increased neuropathy.   Restaging CT evaluation 06/08/2014 with stable mediastinal and hilar adenopathy. Stable borderline left iliac lymph node. No new or progressive disease identified within the chest, abdomen or pelvis.   "Maintenance" 5-fluorouracil beginning 06/11/2014. 2. Microcytic anemia-likely iron deficiency, improved. He continues oral iron. 3. Gout. 4. Port-A-Cath placement 12/18/2013.  5. Anxiety. He takes Xanax as needed. 6. History of left knee pain and swelling. 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 Trial of Cymbalta initiated 07/09/2014. 8. Thrombocytopenia secondary to chemotherapy. 9. Skin rash 04/30/2014-resolved with doxycycline 10. Chronic edema left lower leg/ankle.   Disposition:  He appears to be tolerating the 5-fluorouracil well. The plan is to continue every 2 week 5 fluorouracil until he completes the next restaging CT. This will be scheduled in late January or early February.  The pain in the left groin is most likely postoperative pain.  [name], MD  08/06/2014  10:03 AM

## 2014-08-06 NOTE — Patient Instructions (Signed)
[facility] Discharge Instructions for Patients Receiving Chemotherapy  Today you received the following chemotherapy agents 5 FU leucovorin   To help prevent nausea and vomiting after your treatment, we encourage you to take your nausea medication as directed/prescribed. If you develop nausea and vomiting that is not controlled by your nausea medication, call the clinic.   BELOW ARE SYMPTOMS THAT SHOULD BE REPORTED IMMEDIATELY:  *FEVER GREATER THAN 100.5 F  *CHILLS WITH OR WITHOUT FEVER  NAUSEA AND VOMITING THAT IS NOT CONTROLLED WITH YOUR NAUSEA MEDICATION  *UNUSUAL SHORTNESS OF BREATH  *UNUSUAL BRUISING OR BLEEDING  TENDERNESS IN MOUTH AND THROAT WITH OR WITHOUT PRESENCE OF ULCERS  *URINARY PROBLEMS  *BOWEL PROBLEMS  UNUSUAL RASH Items with * indicate a potential emergency and should be followed up as soon as possible.  Feel free to call the clinic you have any questions or concerns. The clinic phone number is [phone].

## 2014-08-06 NOTE — Telephone Encounter (Signed)
Per staff message and POF I have scheduled appts. Advised scheduler of appts. JMW  

## 2014-08-08 ENCOUNTER — Ambulatory Visit (HOSPITAL_BASED_OUTPATIENT_CLINIC_OR_DEPARTMENT_OTHER): Payer: 59

## 2014-08-08 DIAGNOSIS — C7970 Secondary malignant neoplasm of unspecified adrenal gland: Secondary | ICD-10-CM

## 2014-08-08 DIAGNOSIS — C20 Malignant neoplasm of rectum: Secondary | ICD-10-CM

## 2014-08-08 MED ORDER — SODIUM CHLORIDE 0.9 % IJ SOLN
10.0000 mL | INTRAMUSCULAR | Status: DC | PRN
  Administered 2014-08-08: 10 mL
  Filled 2014-08-08: qty 10

## 2014-08-08 MED ORDER — HEPARIN SOD (PORK) LOCK FLUSH 100 UNIT/ML IV SOLN
500.0000 [IU] | Freq: Once | INTRAVENOUS | Status: AC | PRN
  Administered 2014-08-08: 500 [IU]
  Filled 2014-08-08: qty 5

## 2014-08-08 NOTE — Patient Instructions (Signed)
Fluorouracil, 5FU; Diclofenac topical cream What is this medicine? FLUOROURACIL; DICLOFENAC (flure oh YOOR a sil; dye KLOE fen ak) is a combination of a topical chemotherapy agent and non-steroidal anti-inflammatory drug (NSAID). It is used on the skin to treat skin cancer and skin conditions that could become cancer. This medicine may be used for other purposes; ask your health care provider or pharmacist if you have questions. COMMON BRAND NAME(S): FLUORAC What should I tell my health care provider before I take this medicine? They need to know if you have any of these conditions: -bleeding problems -cigarette smoker -DPD enzyme deficiency -heart disease -high blood pressure -if you frequently drink alcohol containing drinks -kidney disease -liver disease -open or infected skin -stomach problems -swelling or open sores at the treatment site -recent or planned coronary artery bypass graft (CABG) surgery -an unusual or allergic reaction to fluorouracil, diclofenac, aspirin, other NSAIDs, other medicines, foods, dyes, or preservatives -pregnant or trying to get pregnant -breast-feeding How should I use this medicine? This medicine is only for use on the skin. Follow the directions on the prescription label. Wash hands before and after use. Wash affected area and gently pat dry. To apply this medicine use a cotton-tipped applicator, or use gloves if applying with fingertips. If applied with unprotected fingertips, it is very important to wash your hands well after you apply this medicine. Avoid applying to the eyes, nose, or mouth. Apply enough medicine to cover the affected area. You can cover the area with a light gauze dressing, but do not use tight or air-tight dressings. Finish the full course prescribed by your doctor or health care professional, even if you think your condition is better. Do not stop taking except on the advice of your doctor or health care professional. Talk to your  pediatrician regarding the use of this medicine in children. Special care may be needed. Overdosage: If you think you've taken too much of this medicine contact a poison control center or emergency room at once. Overdosage: If you think you have taken too much of this medicine contact a poison control center or emergency room at once. NOTE: This medicine is only for you. Do not share this medicine with others. What if I miss a dose? If you miss a dose, apply it as soon as you can. If it is almost time for your next dose, only use that dose. Do not apply extra doses. Contact your doctor or health care professional if you miss more than one dose. What may interact with this medicine? Interactions are not expected. Do not use any other skin products without telling your doctor or health care professional. This list may not describe all possible interactions. Give your health care provider a list of all the medicines, herbs, non-prescription drugs, or dietary supplements you use. Also tell them if you smoke, drink alcohol, or use illegal drugs. Some items may interact with your medicine. What should I watch for while using this medicine? Visit your doctor or health care professional for checks on your progress. You will need to use this medicine for 2 to 6 weeks. This may be longer depending on the condition being treated. You may not see full healing for another 1 to 2 months after you stop using the medicine. Treated areas of skin can look unsightly during and for several weeks after treatment with this medicine. This medicine can make you more sensitive to the sun. Keep out of the sun. If you cannot avoid being in   the sun, wear protective clothing and use sunscreen. Do not use sun lamps or tanning beds/booths. Where should I keep my What side effects may I notice from receiving this medicine? Side effects that you should report to your doctor or health care professional as soon as possible: -allergic  reactions like skin rash, itching or hives, swelling of the face, lips, or tongue -black or bloody stools, blood in the urine or vomit -blurred vision -chest pain -difficulty breathing or wheezing -redness, blistering, peeling or loosening of the skin, including inside the mouth -severe redness and swelling of normal skin -slurred speech or weakness on one side of the body -trouble passing urine or change in the amount of urine -unexplained weight gain or swelling -unusually weak or tired -yellowing of eyes or skin Side effects that usually do not require medical attention (Report these to your doctor or health care professional if they continue or are bothersome.): -increased sensitivity of the skin to sun and ultraviolet light -pain and burning of the affected area -scaling or swelling of the affected area -skin rash, itching of the affected area -tenderness This list may not describe all possible side effects. Call your doctor for medical advice about side effects. You may report side effects to FDA at 1-800-FDA-1088. Where should I keep my medicine? Keep out of the reach of children. Store at room temperature between 20 and 25 degrees C (68 and 77 degrees F). Throw away any unused medicine after the expiration date. NOTE: This sheet is a summary. It may not cover all possible information. If you have questions about this medicine, talk to your doctor, pharmacist, or health care provider.  2015, Elsevier/Gold Standard. (2013-12-25 11:09:58)  

## 2014-08-19 ENCOUNTER — Other Ambulatory Visit: Payer: Self-pay | Admitting: Oncology

## 2014-08-20 ENCOUNTER — Other Ambulatory Visit (HOSPITAL_BASED_OUTPATIENT_CLINIC_OR_DEPARTMENT_OTHER): Payer: 59

## 2014-08-20 ENCOUNTER — Other Ambulatory Visit: Payer: Self-pay | Admitting: *Deleted

## 2014-08-20 ENCOUNTER — Telehealth: Payer: Self-pay | Admitting: Nurse Practitioner

## 2014-08-20 ENCOUNTER — Ambulatory Visit (HOSPITAL_BASED_OUTPATIENT_CLINIC_OR_DEPARTMENT_OTHER): Payer: 59

## 2014-08-20 ENCOUNTER — Ambulatory Visit (HOSPITAL_BASED_OUTPATIENT_CLINIC_OR_DEPARTMENT_OTHER): Payer: 59 | Admitting: Nurse Practitioner

## 2014-08-20 VITALS — BP 152/81 | HR 66 | Temp 98.6°F | Resp 18 | Ht 72.0 in | Wt 235.2 lb

## 2014-08-20 DIAGNOSIS — C7970 Secondary malignant neoplasm of unspecified adrenal gland: Secondary | ICD-10-CM

## 2014-08-20 DIAGNOSIS — Z23 Encounter for immunization: Secondary | ICD-10-CM

## 2014-08-20 DIAGNOSIS — D509 Iron deficiency anemia, unspecified: Secondary | ICD-10-CM

## 2014-08-20 DIAGNOSIS — D6959 Other secondary thrombocytopenia: Secondary | ICD-10-CM

## 2014-08-20 DIAGNOSIS — R609 Edema, unspecified: Secondary | ICD-10-CM

## 2014-08-20 DIAGNOSIS — C20 Malignant neoplasm of rectum: Secondary | ICD-10-CM

## 2014-08-20 DIAGNOSIS — F411 Generalized anxiety disorder: Secondary | ICD-10-CM

## 2014-08-20 DIAGNOSIS — Z5111 Encounter for antineoplastic chemotherapy: Secondary | ICD-10-CM

## 2014-08-20 DIAGNOSIS — G620 Drug-induced polyneuropathy: Secondary | ICD-10-CM

## 2014-08-20 LAB — COMPREHENSIVE METABOLIC PANEL (CC13)
ALT: 18 U/L (ref 0–55)
ANION GAP: 14 meq/L — AB (ref 3–11)
AST: 19 U/L (ref 5–34)
Albumin: 3.4 g/dL — ABNORMAL LOW (ref 3.5–5.0)
Alkaline Phosphatase: 78 U/L (ref 40–150)
BILIRUBIN TOTAL: 0.31 mg/dL (ref 0.20–1.20)
BUN: 11.9 mg/dL (ref 7.0–26.0)
CO2: 13 meq/L — AB (ref 22–29)
Calcium: 8.6 mg/dL (ref 8.4–10.4)
Chloride: 111 mEq/L — ABNORMAL HIGH (ref 98–109)
Creatinine: 0.9 mg/dL (ref 0.7–1.3)
EGFR: >90 mL/min/{1.73_m2} (ref >90)
Glucose: 111 mg/dl (ref 70–140)
POTASSIUM: 4.1 meq/L (ref 3.5–5.1)
SODIUM: 139 meq/L (ref 136–145)
TOTAL PROTEIN: 6.9 g/dL (ref 6.4–8.3)

## 2014-08-20 LAB — CBC WITH DIFFERENTIAL/PLATELET
BASO%: 0.8 % (ref 0.0–2.0)
Basophils Absolute: 0 10*3/uL (ref 0.0–0.1)
EOS ABS: 0.2 10*3/uL (ref 0.0–0.5)
EOS%: 2.7 % (ref 0.0–7.0)
HCT: 42.7 % (ref 38.4–49.9)
HGB: 14.5 g/dL (ref 13.0–17.1)
LYMPH#: 1.8 10*3/uL (ref 0.9–3.3)
LYMPH%: 31.6 % (ref 14.0–49.0)
MCH: 33.8 pg — ABNORMAL HIGH (ref 27.2–33.4)
MCHC: 33.9 g/dL (ref 32.0–36.0)
MCV: 99.6 fL — ABNORMAL HIGH (ref 79.3–98.0)
MONO#: 0.6 10*3/uL (ref 0.1–0.9)
MONO%: 10 % (ref 0.0–14.0)
NEUT%: 54.9 % (ref 39.0–75.0)
NEUTROS ABS: 3.1 10*3/uL (ref 1.5–6.5)
PLATELETS: 144 10*3/uL (ref 140–400)
RBC: 4.28 10*6/uL (ref 4.20–5.82)
RDW: 15.3 % — AB (ref 11.0–14.6)
WBC: 5.7 10*3/uL (ref 4.0–10.3)

## 2014-08-20 MED ORDER — SODIUM CHLORIDE 0.9 % IV SOLN
2400.0000 mg/m2 | INTRAVENOUS | Status: DC
  Administered 2014-08-20: 5200 mg via INTRAVENOUS
  Filled 2014-08-20: qty 104

## 2014-08-20 MED ORDER — ALPRAZOLAM 1 MG PO TABS: ORAL_TABLET | ORAL | Status: DC

## 2014-08-20 MED ORDER — HEPARIN SOD (PORK) LOCK FLUSH 100 UNIT/ML IV SOLN
500.0000 [IU] | Freq: Once | INTRAVENOUS | Status: DC | PRN
  Filled 2014-08-20: qty 5

## 2014-08-20 MED ORDER — SODIUM CHLORIDE 0.9 % IV SOLN
INTRAVENOUS | Status: DC
  Administered 2014-08-20: 10:00:00 via INTRAVENOUS

## 2014-08-20 MED ORDER — FLUOROURACIL CHEMO INJECTION 2.5 GM/50ML
400.0000 mg/m2 | Freq: Once | INTRAVENOUS | Status: AC
  Administered 2014-08-20: 850 mg via INTRAVENOUS
  Filled 2014-08-20: qty 17

## 2014-08-20 MED ORDER — LEUCOVORIN CALCIUM INJECTION 350 MG
393.5000 mg/m2 | Freq: Once | INTRAVENOUS | Status: AC
  Administered 2014-08-20: 850 mg via INTRAVENOUS
  Filled 2014-08-20: qty 42.5

## 2014-08-20 MED ORDER — SODIUM CHLORIDE 0.9 % IJ SOLN
10.0000 mL | INTRAMUSCULAR | Status: DC | PRN
  Filled 2014-08-20: qty 10

## 2014-08-20 NOTE — Patient Instructions (Addendum)
[facility] Discharge Instructions for Patients Receiving Chemotherapy  Today you received the following chemotherapy agents: Leucovorin, 5FU  To help prevent nausea and vomiting after your treatment, we encourage you to take your nausea medication: Compazine (Prochlorperazine). Take one every 6 hours as needed.   If you develop nausea and vomiting that is not controlled by your nausea medication, call the clinic.   BELOW ARE SYMPTOMS THAT SHOULD BE REPORTED IMMEDIATELY:  *FEVER GREATER THAN 100.5 F  *CHILLS WITH OR WITHOUT FEVER  NAUSEA AND VOMITING THAT IS NOT CONTROLLED WITH YOUR NAUSEA MEDICATION  *UNUSUAL SHORTNESS OF BREATH  *UNUSUAL BRUISING OR BLEEDING  TENDERNESS IN MOUTH AND THROAT WITH OR WITHOUT PRESENCE OF ULCERS  *URINARY PROBLEMS  *BOWEL PROBLEMS  UNUSUAL RASH Items with * indicate a potential emergency and should be followed up as soon as possible.  Feel free to call the clinic should you have any questions or concerns. The clinic phone number is [phone].    

## 2014-08-20 NOTE — Telephone Encounter (Signed)
Pt confirmed labs/ov per 12/14 POF, gave pt AVS..... KJ, sent msg to add chemo °

## 2014-08-20 NOTE — Progress Notes (Signed)
  [facility] OFFICE PROGRESS NOTE   Diagnosis:  Colon cancer  INTERVAL HISTORY:   Mr. Theiler returns as scheduled. He continues every 2 week 5-fluorouracil. He overall feels well. No nausea or vomiting. No mouth sores. He has intermittent loose stools. He has continued numbness in the toes greater than fingertips. Toes feel "cold". He has discontinued Cymbalta stating he did not like how it made him feel. He continues to have a good appetite. Left groin pain is less.  Objective:  Vital signs in last 24 hours:  Blood pressure 152/81, pulse 66, temperature 98.6 F (37 C), temperature source Oral, resp. rate 18, height 6' (1.829 m), weight 235 lb 3.2 oz (106.686 kg), SpO2 100 %.    HEENT: no thrush or ulcers. Resp: lungs clear bilaterally. Cardio: regular rate and rhythm. GI: abdomen soft and nontender. No hepatomegaly. Right lower quadrant urostomy. Left lower quadrant colostomy. Vascular: trace edema at the lower legs bilaterally left greater than right. Skin: palms are dry appearing. Port-A-Cath without erythema    Lab Results:  Lab Results  Component Value Date   WBC 5.7 08/20/2014   HGB 14.5 08/20/2014   HCT 42.7 08/20/2014   MCV 99.6* 08/20/2014   PLT 144 08/20/2014   NEUTROABS 3.1 08/20/2014    Imaging:  No results found.  Medications: I have reviewed the patient's current medications.  Assessment/Plan: 1. Clinical stage IV (O3A,N1B,T6) adenocarcinoma of the rectum with tumor directly extending to the bladder/prostate and CT scan evidence of metastatic chest adenopathy  Positive K-ras mutation.   Normal mismatch repair protein expression. Microsatellite stable   Status post low anterior resection and end colostomy with a cystoprostatectomy and colon conduit urinary diversion 11/01/2013.   Staging PET scan with hypermetabolic pelvic nodes, mediastinal nodes, left adrenal metastasis, and left upper lobe nodule.   Initiation of FOLFOX  12/25/2013.   Restaging CT 03/16/2014 after 6 cycles of FOLFOX confirmed improvement in chest/pelvic lymphadenopathy, a left upper lobe nodule, and resolution of a left adrenal nodule   Oxaliplatin deleted from cycle 8 FOLFOX 04/02/2014 secondary to neuropathy and thrombocytopenia.   Oxaliplatin held with cycle 9 FOLFOX 04/16/2014 due to neuropathy.   Cycle 10 FOLFOX 04/30/2014.   Cycle 11 FOLFOX 05/15/2014.   Cycle 12 FOLFOX 05/28/2014. Oxaliplatin held due to increased neuropathy.   Restaging CT evaluation 06/08/2014 with stable mediastinal and hilar adenopathy. Stable borderline left iliac lymph node. No new or progressive disease identified within the chest, abdomen or pelvis.   "Maintenance" 5-fluorouracil beginning 06/11/2014. 2. Microcytic anemia-likely iron deficiency, improved. He continues oral iron. 3. Gout. 4. Port-A-Cath placement 12/18/2013.  5. Anxiety. He takes Xanax as needed. 6. History of left knee pain and swelling. 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 Trial of Cymbalta initiated 07/09/2014. 8. Thrombocytopenia secondary to chemotherapy. 9. Skin rash 04/30/2014-resolved with doxycycline 10. Chronic edema left lower leg/ankle.   Disposition: Mr. Muegge appears stable. Plan to continue every 2 week 5 fluorouracil. Next restaging CT evaluation late January/early February. He will return for a followup visit in 4 weeks.    [name] ANP/GNP-BC   08/20/2014  9:55 AM

## 2014-08-22 ENCOUNTER — Ambulatory Visit (HOSPITAL_BASED_OUTPATIENT_CLINIC_OR_DEPARTMENT_OTHER): Payer: 59

## 2014-08-22 DIAGNOSIS — C20 Malignant neoplasm of rectum: Secondary | ICD-10-CM

## 2014-08-22 MED ORDER — SODIUM CHLORIDE 0.9 % IJ SOLN
10.0000 mL | INTRAMUSCULAR | Status: DC | PRN
  Administered 2014-08-22: 10 mL
  Filled 2014-08-22: qty 10

## 2014-08-22 MED ORDER — HEPARIN SOD (PORK) LOCK FLUSH 100 UNIT/ML IV SOLN
500.0000 [IU] | Freq: Once | INTRAVENOUS | Status: AC | PRN
  Administered 2014-08-22: 500 [IU]
  Filled 2014-08-22: qty 5

## 2014-08-22 NOTE — Patient Instructions (Signed)
Fluorouracil, 5FU; Diclofenac topical cream What is this medicine? FLUOROURACIL; DICLOFENAC (flure oh YOOR a sil; dye KLOE fen ak) is a combination of a topical chemotherapy agent and non-steroidal anti-inflammatory drug (NSAID). It is used on the skin to treat skin cancer and skin conditions that could become cancer. This medicine may be used for other purposes; ask your health care provider or pharmacist if you have questions. COMMON BRAND NAME(S): FLUORAC What should I tell my health care provider before I take this medicine? They need to know if you have any of these conditions: -bleeding problems -cigarette smoker -DPD enzyme deficiency -heart disease -high blood pressure -if you frequently drink alcohol containing drinks -kidney disease -liver disease -open or infected skin -stomach problems -swelling or open sores at the treatment site -recent or planned coronary artery bypass graft (CABG) surgery -an unusual or allergic reaction to fluorouracil, diclofenac, aspirin, other NSAIDs, other medicines, foods, dyes, or preservatives -pregnant or trying to get pregnant -breast-feeding How should I use this medicine? This medicine is only for use on the skin. Follow the directions on the prescription label. Wash hands before and after use. Wash affected area and gently pat dry. To apply this medicine use a cotton-tipped applicator, or use gloves if applying with fingertips. If applied with unprotected fingertips, it is very important to wash your hands well after you apply this medicine. Avoid applying to the eyes, nose, or mouth. Apply enough medicine to cover the affected area. You can cover the area with a light gauze dressing, but do not use tight or air-tight dressings. Finish the full course prescribed by your doctor or health care professional, even if you think your condition is better. Do not stop taking except on the advice of your doctor or health care professional. Talk to your  pediatrician regarding the use of this medicine in children. Special care may be needed. Overdosage: If you think you've taken too much of this medicine contact a poison control center or emergency room at once. Overdosage: If you think you have taken too much of this medicine contact a poison control center or emergency room at once. NOTE: This medicine is only for you. Do not share this medicine with others. What if I miss a dose? If you miss a dose, apply it as soon as you can. If it is almost time for your next dose, only use that dose. Do not apply extra doses. Contact your doctor or health care professional if you miss more than one dose. What may interact with this medicine? Interactions are not expected. Do not use any other skin products without telling your doctor or health care professional. This list may not describe all possible interactions. Give your health care provider a list of all the medicines, herbs, non-prescription drugs, or dietary supplements you use. Also tell them if you smoke, drink alcohol, or use illegal drugs. Some items may interact with your medicine. What should I watch for while using this medicine? Visit your doctor or health care professional for checks on your progress. You will need to use this medicine for 2 to 6 weeks. This may be longer depending on the condition being treated. You may not see full healing for another 1 to 2 months after you stop using the medicine. Treated areas of skin can look unsightly during and for several weeks after treatment with this medicine. This medicine can make you more sensitive to the sun. Keep out of the sun. If you cannot avoid being in   the sun, wear protective clothing and use sunscreen. Do not use sun lamps or tanning beds/booths. Where should I keep my What side effects may I notice from receiving this medicine? Side effects that you should report to your doctor or health care professional as soon as possible: -allergic  reactions like skin rash, itching or hives, swelling of the face, lips, or tongue -black or bloody stools, blood in the urine or vomit -blurred vision -chest pain -difficulty breathing or wheezing -redness, blistering, peeling or loosening of the skin, including inside the mouth -severe redness and swelling of normal skin -slurred speech or weakness on one side of the body -trouble passing urine or change in the amount of urine -unexplained weight gain or swelling -unusually weak or tired -yellowing of eyes or skin Side effects that usually do not require medical attention (Report these to your doctor or health care professional if they continue or are bothersome.): -increased sensitivity of the skin to sun and ultraviolet light -pain and burning of the affected area -scaling or swelling of the affected area -skin rash, itching of the affected area -tenderness This list may not describe all possible side effects. Call your doctor for medical advice about side effects. You may report side effects to FDA at 1-800-FDA-1088. Where should I keep my medicine? Keep out of the reach of children. Store at room temperature between 20 and 25 degrees C (68 and 77 degrees F). Throw away any unused medicine after the expiration date. NOTE: This sheet is a summary. It may not cover all possible information. If you have questions about this medicine, talk to your doctor, pharmacist, or health care provider.  2015, Elsevier/Gold Standard. (2013-12-25 11:09:58)  

## 2014-08-23 NOTE — Telephone Encounter (Signed)
Close encounter 

## 2014-09-02 ENCOUNTER — Other Ambulatory Visit: Payer: Self-pay | Admitting: Oncology

## 2014-09-03 ENCOUNTER — Ambulatory Visit (HOSPITAL_BASED_OUTPATIENT_CLINIC_OR_DEPARTMENT_OTHER): Payer: 59

## 2014-09-03 ENCOUNTER — Other Ambulatory Visit (HOSPITAL_BASED_OUTPATIENT_CLINIC_OR_DEPARTMENT_OTHER): Payer: 59

## 2014-09-03 DIAGNOSIS — C20 Malignant neoplasm of rectum: Secondary | ICD-10-CM

## 2014-09-03 DIAGNOSIS — C7970 Secondary malignant neoplasm of unspecified adrenal gland: Secondary | ICD-10-CM

## 2014-09-03 DIAGNOSIS — Z5111 Encounter for antineoplastic chemotherapy: Secondary | ICD-10-CM

## 2014-09-03 LAB — CBC WITH DIFFERENTIAL/PLATELET
BASO%: 0.2 % (ref 0.0–2.0)
Basophils Absolute: 0 10*3/uL (ref 0.0–0.1)
EOS%: 2.5 % (ref 0.0–7.0)
Eosinophils Absolute: 0.1 10*3/uL (ref 0.0–0.5)
HCT: 40.1 % (ref 38.4–49.9)
HGB: 14.1 g/dL (ref 13.0–17.1)
LYMPH%: 33.3 % (ref 14.0–49.0)
MCH: 33.9 pg — AB (ref 27.2–33.4)
MCHC: 35.2 g/dL (ref 32.0–36.0)
MCV: 96.4 fL (ref 79.3–98.0)
MONO#: 0.6 10*3/uL (ref 0.1–0.9)
MONO%: 11.3 % (ref 0.0–14.0)
NEUT%: 52.7 % (ref 39.0–75.0)
NEUTROS ABS: 2.7 10*3/uL (ref 1.5–6.5)
NRBC: 0 % (ref 0–0)
Platelets: 104 10*3/uL — ABNORMAL LOW (ref 140–400)
RBC: 4.16 10*6/uL — AB (ref 4.20–5.82)
RDW: 14.8 % — ABNORMAL HIGH (ref 11.0–14.6)
WBC: 5.2 10*3/uL (ref 4.0–10.3)
lymph#: 1.7 10*3/uL (ref 0.9–3.3)

## 2014-09-03 MED ORDER — LEUCOVORIN CALCIUM INJECTION 350 MG
393.5000 mg/m2 | Freq: Once | INTRAVENOUS | Status: AC
  Administered 2014-09-03: 850 mg via INTRAVENOUS
  Filled 2014-09-03: qty 42.5

## 2014-09-03 MED ORDER — FLUOROURACIL CHEMO INJECTION 2.5 GM/50ML
400.0000 mg/m2 | Freq: Once | INTRAVENOUS | Status: AC
  Administered 2014-09-03: 850 mg via INTRAVENOUS
  Filled 2014-09-03: qty 17

## 2014-09-03 MED ORDER — SODIUM CHLORIDE 0.9 % IV SOLN
2400.0000 mg/m2 | INTRAVENOUS | Status: DC
  Administered 2014-09-03: 5200 mg via INTRAVENOUS
  Filled 2014-09-03: qty 104

## 2014-09-03 NOTE — Patient Instructions (Signed)
[facility] Discharge Instructions for Patients Receiving Chemotherapy  Today you received the following chemotherapy agents: Leucovorin, 5FU  To help prevent nausea and vomiting after your treatment, we encourage you to take your nausea medication: Compazine (Prochlorperazine). Take one every 6 hours as needed.   If you develop nausea and vomiting that is not controlled by your nausea medication, call the clinic.   BELOW ARE SYMPTOMS THAT SHOULD BE REPORTED IMMEDIATELY:  *FEVER GREATER THAN 100.5 F  *CHILLS WITH OR WITHOUT FEVER  NAUSEA AND VOMITING THAT IS NOT CONTROLLED WITH YOUR NAUSEA MEDICATION  *UNUSUAL SHORTNESS OF BREATH  *UNUSUAL BRUISING OR BLEEDING  TENDERNESS IN MOUTH AND THROAT WITH OR WITHOUT PRESENCE OF ULCERS  *URINARY PROBLEMS  *BOWEL PROBLEMS  UNUSUAL RASH Items with * indicate a potential emergency and should be followed up as soon as possible.  Feel free to call the clinic should you have any questions or concerns. The clinic phone number is [phone].

## 2014-09-05 ENCOUNTER — Ambulatory Visit (HOSPITAL_BASED_OUTPATIENT_CLINIC_OR_DEPARTMENT_OTHER): Payer: 59

## 2014-09-05 ENCOUNTER — Other Ambulatory Visit: Payer: Self-pay | Admitting: Oncology

## 2014-09-05 DIAGNOSIS — C7970 Secondary malignant neoplasm of unspecified adrenal gland: Secondary | ICD-10-CM

## 2014-09-05 DIAGNOSIS — C20 Malignant neoplasm of rectum: Secondary | ICD-10-CM

## 2014-09-05 MED ORDER — HEPARIN SOD (PORK) LOCK FLUSH 100 UNIT/ML IV SOLN
500.0000 [IU] | Freq: Once | INTRAVENOUS | Status: AC | PRN
  Administered 2014-09-05: 500 [IU]
  Filled 2014-09-05: qty 5

## 2014-09-05 MED ORDER — SODIUM CHLORIDE 0.9 % IJ SOLN
10.0000 mL | INTRAMUSCULAR | Status: DC | PRN
  Administered 2014-09-05: 10 mL
  Filled 2014-09-05: qty 10

## 2014-09-05 NOTE — Progress Notes (Signed)
Pt in for pump d/c today, states he had a slight temp of 99.0 last night and took some tylenol. Temp stable today at 98.0, pt states he has been more SOB with this treatment than the other ones. Pt o2 at 100%.  Pt denies any other concerns. Pt sees Dr. Benay Spice, MD on 09/17/13, informed pt to call back with a temperature or any other concerns or questions.  Pt verbalized understanding.

## 2014-09-13 ENCOUNTER — Encounter: Payer: Self-pay | Admitting: Family Medicine

## 2014-09-13 ENCOUNTER — Ambulatory Visit (INDEPENDENT_AMBULATORY_CARE_PROVIDER_SITE_OTHER): Payer: 59 | Admitting: Family Medicine

## 2014-09-13 VITALS — BP 129/84 | HR 73 | Temp 98.7°F | Ht 72.0 in | Wt 239.0 lb

## 2014-09-13 DIAGNOSIS — C20 Malignant neoplasm of rectum: Secondary | ICD-10-CM

## 2014-09-13 NOTE — Progress Notes (Signed)
   Subjective:    Patient ID: Austin Robbins, male    DOB: 01-Oct-1955, 59 y.o.   MRN: [id number]  HPI Patient is here for follow up.  He has bladder and colon cancer and is seeing Heme/Onc and Radiology Oncology and he needs a referral.  Review of Systems  Constitutional: Negative for fever.  HENT: Negative for ear pain.   Eyes: Negative for discharge.  Respiratory: Negative for cough.   Cardiovascular: Negative for chest pain.  Gastrointestinal: Negative for abdominal distention.  Endocrine: Negative for polyuria.  Genitourinary: Negative for difficulty urinating.  Musculoskeletal: Negative for gait problem and neck pain.  Skin: Negative for color change and rash.  Neurological: Negative for speech difficulty and headaches.  Psychiatric/Behavioral: Negative for agitation.       Objective:    BP 129/84 mmHg  Pulse 73  Temp(Src) 98.7 F (37.1 C) (Oral)  Ht 6' (1.829 m)  Wt 239 lb (108.41 kg)  BMI 32.41 kg/m2 Physical Exam  Constitutional: He is oriented to person, place, and time. He appears well-developed and well-nourished.  HENT:  Head: Normocephalic and atraumatic.  Mouth/Throat: Oropharynx is clear and moist.  Eyes: Pupils are equal, round, and reactive to light.  Neck: Normal range of motion. Neck supple.  Cardiovascular: Normal rate and regular rhythm.   No murmur heard. Pulmonary/Chest: Effort normal and breath sounds normal.  Abdominal: Soft. Bowel sounds are normal. There is no tenderness.  Neurological: He is alert and oriented to person, place, and time.  Skin: Skin is warm and dry.  Psychiatric: He has a normal mood and affect.          Assessment & Plan:     ICD-9-CM ICD-10-CM   1. Rectal adenocarcinoma with perforation & invasion into bladder s/p LAR/cystectomy/colon conduit 11/04/2013 154.1 C20 Ambulatory referral to Oncology     Ambulatory referral to Urology     Ambulatory referral to General Surgery     No Follow-up on file.  Lysbeth Penner FNP

## 2014-09-16 ENCOUNTER — Other Ambulatory Visit: Payer: Self-pay | Admitting: Oncology

## 2014-09-17 ENCOUNTER — Ambulatory Visit (HOSPITAL_COMMUNITY)
Admission: RE | Admit: 2014-09-17 | Discharge: 2014-09-17 | Disposition: A | Discharge status: Home / Self Care | Payer: 59 | Source: Ambulatory Visit | Attending: Oncology | Admitting: Oncology

## 2014-09-17 ENCOUNTER — Ambulatory Visit (HOSPITAL_BASED_OUTPATIENT_CLINIC_OR_DEPARTMENT_OTHER): Payer: 59

## 2014-09-17 ENCOUNTER — Telehealth: Payer: Self-pay | Admitting: Oncology

## 2014-09-17 ENCOUNTER — Other Ambulatory Visit (HOSPITAL_COMMUNITY): Payer: Self-pay | Admitting: Oncology

## 2014-09-17 ENCOUNTER — Other Ambulatory Visit (HOSPITAL_BASED_OUTPATIENT_CLINIC_OR_DEPARTMENT_OTHER): Payer: 59

## 2014-09-17 ENCOUNTER — Ambulatory Visit (HOSPITAL_BASED_OUTPATIENT_CLINIC_OR_DEPARTMENT_OTHER): Payer: 59 | Admitting: Oncology

## 2014-09-17 VITALS — BP 148/94 | HR 59 | Temp 97.8°F | Resp 18 | Ht 72.0 in | Wt 242.8 lb

## 2014-09-17 DIAGNOSIS — C20 Malignant neoplasm of rectum: Secondary | ICD-10-CM | POA: Diagnosis present

## 2014-09-17 DIAGNOSIS — Z5111 Encounter for antineoplastic chemotherapy: Secondary | ICD-10-CM

## 2014-09-17 DIAGNOSIS — R590 Localized enlarged lymph nodes: Secondary | ICD-10-CM | POA: Diagnosis not present

## 2014-09-17 DIAGNOSIS — G8929 Other chronic pain: Secondary | ICD-10-CM

## 2014-09-17 DIAGNOSIS — D6959 Other secondary thrombocytopenia: Secondary | ICD-10-CM

## 2014-09-17 DIAGNOSIS — C7970 Secondary malignant neoplasm of unspecified adrenal gland: Secondary | ICD-10-CM

## 2014-09-17 DIAGNOSIS — M7989 Other specified soft tissue disorders: Secondary | ICD-10-CM

## 2014-09-17 LAB — CBC WITH DIFFERENTIAL/PLATELET
BASO%: 0.2 % (ref 0.0–2.0)
Basophils Absolute: 0 10*3/uL (ref 0.0–0.1)
EOS%: 2.2 % (ref 0.0–7.0)
Eosinophils Absolute: 0.1 10*3/uL (ref 0.0–0.5)
HCT: 40.4 % (ref 38.4–49.9)
HEMOGLOBIN: 14 g/dL (ref 13.0–17.1)
LYMPH%: 33.7 % (ref 14.0–49.0)
MCH: 34 pg — AB (ref 27.2–33.4)
MCHC: 34.7 g/dL (ref 32.0–36.0)
MCV: 98.1 fL — ABNORMAL HIGH (ref 79.3–98.0)
MONO#: 0.5 10*3/uL (ref 0.1–0.9)
MONO%: 8 % (ref 0.0–14.0)
NEUT%: 55.9 % (ref 39.0–75.0)
NEUTROS ABS: 3.5 10*3/uL (ref 1.5–6.5)
PLATELETS: 131 10*3/uL — AB (ref 140–400)
RBC: 4.12 10*6/uL — ABNORMAL LOW (ref 4.20–5.82)
RDW: 14.6 % (ref 11.0–14.6)
WBC: 6.2 10*3/uL (ref 4.0–10.3)
lymph#: 2.1 10*3/uL (ref 0.9–3.3)

## 2014-09-17 LAB — COMPREHENSIVE METABOLIC PANEL (CC13)
ALK PHOS: 89 U/L (ref 40–150)
ALT: 16 U/L (ref 0–55)
AST: 20 U/L (ref 5–34)
Albumin: 3.5 g/dL (ref 3.5–5.0)
Anion Gap: 7 mEq/L (ref 3–11)
BILIRUBIN TOTAL: 0.45 mg/dL (ref 0.20–1.20)
BUN: 15 mg/dL (ref 7.0–26.0)
CHLORIDE: 108 meq/L (ref 98–109)
CO2: 24 meq/L (ref 22–29)
CREATININE: 0.9 mg/dL (ref 0.7–1.3)
Calcium: 8.9 mg/dL (ref 8.4–10.4)
EGFR: 90 mL/min/{1.73_m2} — ABNORMAL LOW (ref >90)
Glucose: 113 mg/dl (ref 70–140)
Potassium: 4.2 mEq/L (ref 3.5–5.1)
Sodium: 140 mEq/L (ref 136–145)
Total Protein: 6.6 g/dL (ref 6.4–8.3)

## 2014-09-17 MED ORDER — FLUOROURACIL CHEMO INJECTION 2.5 GM/50ML
400.0000 mg/m2 | Freq: Once | INTRAVENOUS | Status: AC
  Administered 2014-09-17: 850 mg via INTRAVENOUS
  Filled 2014-09-17: qty 17

## 2014-09-17 MED ORDER — SODIUM CHLORIDE 0.9 % IV SOLN
2400.0000 mg/m2 | INTRAVENOUS | Status: DC
  Administered 2014-09-17: 5200 mg via INTRAVENOUS
  Filled 2014-09-17: qty 104

## 2014-09-17 MED ORDER — DEXTROSE 5 % IV SOLN
Freq: Once | INTRAVENOUS | Status: AC
  Administered 2014-09-17: 10:00:00 via INTRAVENOUS

## 2014-09-17 MED ORDER — LEUCOVORIN CALCIUM INJECTION 350 MG
393.5000 mg/m2 | Freq: Once | INTRAVENOUS | Status: AC
  Administered 2014-09-17: 850 mg via INTRAVENOUS
  Filled 2014-09-17: qty 42.5

## 2014-09-17 NOTE — Progress Notes (Signed)
[facility] OFFICE PROGRESS NOTE   Diagnosis: Rectal cancer  INTERVAL HISTORY:   Austin Robbins returns as scheduled. He continues every 2 week 5 fluorouracil. Persistent neuropathy symptoms in the hands and feet. He has intermittent pain in the left groin. He has noted increased swelling in the left leg for the past 2-3 days. No pain. Good appetite.  Objective:  Vital signs in last 24 hours:  Blood pressure 148/94, pulse 59, temperature 97.8 F (36.6 C), temperature source Oral, resp. rate 18, height 6' (1.829 m), weight 242 lb 12.8 oz (110.133 kg), SpO2 100 %.    HEENT: No thrush or ulcers Resp: Lungs clear bilaterally Cardio: Regular rate and rhythm GI: No hepatomegaly, right lower quadrant urostomy, left lower quadrant colostomy. Examination of the groin is unremarkable. Vascular: 1+ pitting edema at the left greater than right lower leg. Slight edema of the left thigh. No erythema or palpable cord.  Skin: Dry skin over the hands and feet. Abrasion at the left thumb.   Portacath/PICC-without erythema  Lab Results:  Lab Results  Component Value Date   WBC 6.2 09/17/2014   HGB 14.0 09/17/2014   HCT 40.4 09/17/2014   MCV 98.1* 09/17/2014   PLT 131* 09/17/2014   NEUTROABS 3.5 09/17/2014      Lab Results  Component Value Date   CEA 3.1 06/11/2014    Imaging:  No results found.  Medications: I have reviewed the patient's current medications.  Assessment/Plan: 1. Clinical stage IV (N9G,X2J,J9) adenocarcinoma of the rectum with tumor directly extending to the bladder/prostate and CT scan evidence of metastatic chest adenopathy  Positive K-ras mutation.   Normal mismatch repair protein expression. Microsatellite stable   Status post low anterior resection and end colostomy with a cystoprostatectomy and colon conduit urinary diversion 11/01/2013.   Staging PET scan with hypermetabolic pelvic nodes, mediastinal nodes, left adrenal metastasis, and  left upper lobe nodule.   Initiation of FOLFOX 12/25/2013.   Restaging CT 03/16/2014 after 6 cycles of FOLFOX confirmed improvement in chest/pelvic lymphadenopathy, a left upper lobe nodule, and resolution of a left adrenal nodule   Oxaliplatin deleted from cycle 8 FOLFOX 04/02/2014 secondary to neuropathy and thrombocytopenia.   Oxaliplatin held with cycle 9 FOLFOX 04/16/2014 due to neuropathy.   Cycle 10 FOLFOX 04/30/2014.   Cycle 11 FOLFOX 05/15/2014.   Cycle 12 FOLFOX 05/28/2014. Oxaliplatin held due to increased neuropathy.   Restaging CT evaluation 06/08/2014 with stable mediastinal and hilar adenopathy. Stable borderline left iliac lymph node. No new or progressive disease identified within the chest, abdomen or pelvis.   "Maintenance" 5-fluorouracil beginning 06/11/2014. 2. Microcytic anemia-likely iron deficiency, improved. He continues oral iron. 3. Gout. 4. Port-A-Cath placement 12/18/2013.  5. Anxiety. He takes Xanax as needed. 6. History of left knee pain and swelling. 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 Trial of Cymbalta initiated 07/09/2014. 8. Thrombocytopenia secondary to chemotherapy. 9. Skin rash 04/30/2014-resolved with doxycycline 10. Chronic edema left lower leg/ankle. Progressive today.   Disposition:  Austin Robbins is maintained on every 2 week 5 fluorouracil. The plan is to continue chemotherapy on the current schedule.  He will be scheduled for a restaging CT evaluation in one month.  The left groin discomfort is chronic and may be related to surgery.  He has increased swelling in the left leg today. He will be referred for Dopplers of the lower extremities.  [name], MD  09/17/2014  8:55 AM

## 2014-09-17 NOTE — Progress Notes (Signed)
Left lower extremity venous duplex completed.  Left:  No evidence of DVT, superficial thrombosis, or Baker's cyst.  Right:  Negative for DVT in the common femoral vein.  

## 2014-09-17 NOTE — Patient Instructions (Signed)
[facility] Discharge Instructions for Patients Receiving Chemotherapy  Today you received the following chemotherapy agents Leucovorin and Fluorouracil.   To help prevent nausea and vomiting after your treatment, we encourage you to take your nausea medication as directed.    If you develop nausea and vomiting that is not controlled by your nausea medication, call the clinic.   BELOW ARE SYMPTOMS THAT SHOULD BE REPORTED IMMEDIATELY:  *FEVER GREATER THAN 100.5 F  *CHILLS WITH OR WITHOUT FEVER  NAUSEA AND VOMITING THAT IS NOT CONTROLLED WITH YOUR NAUSEA MEDICATION  *UNUSUAL SHORTNESS OF BREATH  *UNUSUAL BRUISING OR BLEEDING  TENDERNESS IN MOUTH AND THROAT WITH OR WITHOUT PRESENCE OF ULCERS  *URINARY PROBLEMS  *BOWEL PROBLEMS  UNUSUAL RASH Items with * indicate a potential emergency and should be followed up as soon as possible.  Feel free to call the clinic you have any questions or concerns. The clinic phone number is [phone].

## 2014-09-17 NOTE — Telephone Encounter (Signed)
Pt confirmed labs/ov per 01/11 POF, gave pt AVS..... KJ, scheduled pt for Venous Lower Extremity...Marland KitchenMarland Kitchen

## 2014-09-19 ENCOUNTER — Ambulatory Visit (HOSPITAL_BASED_OUTPATIENT_CLINIC_OR_DEPARTMENT_OTHER): Payer: 59

## 2014-09-19 DIAGNOSIS — C20 Malignant neoplasm of rectum: Secondary | ICD-10-CM

## 2014-09-19 MED ORDER — HEPARIN SOD (PORK) LOCK FLUSH 100 UNIT/ML IV SOLN
500.0000 [IU] | Freq: Once | INTRAVENOUS | Status: AC | PRN
  Administered 2014-09-19: 500 [IU]
  Filled 2014-09-19: qty 5

## 2014-09-19 MED ORDER — SODIUM CHLORIDE 0.9 % IJ SOLN
10.0000 mL | INTRAMUSCULAR | Status: DC | PRN
  Administered 2014-09-19: 10 mL
  Filled 2014-09-19: qty 10

## 2014-09-19 NOTE — Patient Instructions (Signed)
Fluorouracil, 5FU; Diclofenac topical cream What is this medicine? FLUOROURACIL; DICLOFENAC (flure oh YOOR a sil; dye KLOE fen ak) is a combination of a topical chemotherapy agent and non-steroidal anti-inflammatory drug (NSAID). It is used on the skin to treat skin cancer and skin conditions that could become cancer. This medicine may be used for other purposes; ask your health care provider or pharmacist if you have questions. COMMON BRAND NAME(S): FLUORAC What should I tell my health care provider before I take this medicine? They need to know if you have any of these conditions: -bleeding problems -cigarette smoker -DPD enzyme deficiency -heart disease -high blood pressure -if you frequently drink alcohol containing drinks -kidney disease -liver disease -open or infected skin -stomach problems -swelling or open sores at the treatment site -recent or planned coronary artery bypass graft (CABG) surgery -an unusual or allergic reaction to fluorouracil, diclofenac, aspirin, other NSAIDs, other medicines, foods, dyes, or preservatives -pregnant or trying to get pregnant -breast-feeding How should I use this medicine? This medicine is only for use on the skin. Follow the directions on the prescription label. Wash hands before and after use. Wash affected area and gently pat dry. To apply this medicine use a cotton-tipped applicator, or use gloves if applying with fingertips. If applied with unprotected fingertips, it is very important to wash your hands well after you apply this medicine. Avoid applying to the eyes, nose, or mouth. Apply enough medicine to cover the affected area. You can cover the area with a light gauze dressing, but do not use tight or air-tight dressings. Finish the full course prescribed by your doctor or health care professional, even if you think your condition is better. Do not stop taking except on the advice of your doctor or health care professional. Talk to your  pediatrician regarding the use of this medicine in children. Special care may be needed. Overdosage: If you think you've taken too much of this medicine contact a poison control center or emergency room at once. Overdosage: If you think you have taken too much of this medicine contact a poison control center or emergency room at once. NOTE: This medicine is only for you. Do not share this medicine with others. What if I miss a dose? If you miss a dose, apply it as soon as you can. If it is almost time for your next dose, only use that dose. Do not apply extra doses. Contact your doctor or health care professional if you miss more than one dose. What may interact with this medicine? Interactions are not expected. Do not use any other skin products without telling your doctor or health care professional. This list may not describe all possible interactions. Give your health care provider a list of all the medicines, herbs, non-prescription drugs, or dietary supplements you use. Also tell them if you smoke, drink alcohol, or use illegal drugs. Some items may interact with your medicine. What should I watch for while using this medicine? Visit your doctor or health care professional for checks on your progress. You will need to use this medicine for 2 to 6 weeks. This may be longer depending on the condition being treated. You may not see full healing for another 1 to 2 months after you stop using the medicine. Treated areas of skin can look unsightly during and for several weeks after treatment with this medicine. This medicine can make you more sensitive to the sun. Keep out of the sun. If you cannot avoid being in   the sun, wear protective clothing and use sunscreen. Do not use sun lamps or tanning beds/booths. Where should I keep my What side effects may I notice from receiving this medicine? Side effects that you should report to your doctor or health care professional as soon as possible: -allergic  reactions like skin rash, itching or hives, swelling of the face, lips, or tongue -black or bloody stools, blood in the urine or vomit -blurred vision -chest pain -difficulty breathing or wheezing -redness, blistering, peeling or loosening of the skin, including inside the mouth -severe redness and swelling of normal skin -slurred speech or weakness on one side of the body -trouble passing urine or change in the amount of urine -unexplained weight gain or swelling -unusually weak or tired -yellowing of eyes or skin Side effects that usually do not require medical attention (Report these to your doctor or health care professional if they continue or are bothersome.): -increased sensitivity of the skin to sun and ultraviolet light -pain and burning of the affected area -scaling or swelling of the affected area -skin rash, itching of the affected area -tenderness This list may not describe all possible side effects. Call your doctor for medical advice about side effects. You may report side effects to FDA at 1-800-FDA-1088. Where should I keep my medicine? Keep out of the reach of children. Store at room temperature between 20 and 25 degrees C (68 and 77 degrees F). Throw away any unused medicine after the expiration date. NOTE: This sheet is a summary. It may not cover all possible information. If you have questions about this medicine, talk to your doctor, pharmacist, or health care provider.  2015, Elsevier/Gold Standard. (2013-12-25 11:09:58)  

## 2014-09-24 ENCOUNTER — Ambulatory Visit (HOSPITAL_COMMUNITY): Payer: 59 | Admitting: Hematology & Oncology

## 2014-09-30 ENCOUNTER — Other Ambulatory Visit: Payer: Self-pay | Admitting: Oncology

## 2014-10-01 ENCOUNTER — Ambulatory Visit (HOSPITAL_BASED_OUTPATIENT_CLINIC_OR_DEPARTMENT_OTHER): Payer: 59

## 2014-10-01 ENCOUNTER — Other Ambulatory Visit (HOSPITAL_BASED_OUTPATIENT_CLINIC_OR_DEPARTMENT_OTHER): Payer: 59

## 2014-10-01 ENCOUNTER — Telehealth: Payer: Self-pay | Admitting: Oncology

## 2014-10-01 ENCOUNTER — Ambulatory Visit (HOSPITAL_BASED_OUTPATIENT_CLINIC_OR_DEPARTMENT_OTHER): Payer: 59 | Admitting: Oncology

## 2014-10-01 VITALS — BP 142/70 | HR 62 | Temp 97.7°F | Resp 18 | Ht 72.0 in | Wt 239.7 lb

## 2014-10-01 DIAGNOSIS — C20 Malignant neoplasm of rectum: Secondary | ICD-10-CM

## 2014-10-01 DIAGNOSIS — D6959 Other secondary thrombocytopenia: Secondary | ICD-10-CM

## 2014-10-01 DIAGNOSIS — Z5111 Encounter for antineoplastic chemotherapy: Secondary | ICD-10-CM

## 2014-10-01 DIAGNOSIS — Z23 Encounter for immunization: Secondary | ICD-10-CM

## 2014-10-01 DIAGNOSIS — R609 Edema, unspecified: Secondary | ICD-10-CM

## 2014-10-01 DIAGNOSIS — G620 Drug-induced polyneuropathy: Secondary | ICD-10-CM

## 2014-10-01 DIAGNOSIS — D509 Iron deficiency anemia, unspecified: Secondary | ICD-10-CM

## 2014-10-01 LAB — CBC WITH DIFFERENTIAL/PLATELET
BASO%: 0.7 % (ref 0.0–2.0)
Basophils Absolute: 0 10*3/uL (ref 0.0–0.1)
EOS%: 2.5 % (ref 0.0–7.0)
Eosinophils Absolute: 0.1 10*3/uL (ref 0.0–0.5)
HCT: 41.4 % (ref 38.4–49.9)
HGB: 13.8 g/dL (ref 13.0–17.1)
LYMPH%: 27.5 % (ref 14.0–49.0)
MCH: 32.7 pg (ref 27.2–33.4)
MCHC: 33.3 g/dL (ref 32.0–36.0)
MCV: 98.1 fL — ABNORMAL HIGH (ref 79.3–98.0)
MONO#: 0.5 10*3/uL (ref 0.1–0.9)
MONO%: 9 % (ref 0.0–14.0)
NEUT%: 60.3 % (ref 39.0–75.0)
NEUTROS ABS: 3.2 10*3/uL (ref 1.5–6.5)
Platelets: 146 10*3/uL (ref 140–400)
RBC: 4.22 10*6/uL (ref 4.20–5.82)
RDW: 14.6 % (ref 11.0–14.6)
WBC: 5.4 10*3/uL (ref 4.0–10.3)
lymph#: 1.5 10*3/uL (ref 0.9–3.3)

## 2014-10-01 LAB — COMPREHENSIVE METABOLIC PANEL (CC13)
ALT: 21 U/L (ref 0–55)
AST: 17 U/L (ref 5–34)
Albumin: 3.6 g/dL (ref 3.5–5.0)
Alkaline Phosphatase: 88 U/L (ref 40–150)
Anion Gap: 8 mEq/L (ref 3–11)
BUN: 13.2 mg/dL (ref 7.0–26.0)
CHLORIDE: 112 meq/L — AB (ref 98–109)
CO2: 22 meq/L (ref 22–29)
CREATININE: 1 mg/dL (ref 0.7–1.3)
Calcium: 8.6 mg/dL (ref 8.4–10.4)
EGFR: 79 mL/min/{1.73_m2} — AB (ref >90)
GLUCOSE: 107 mg/dL (ref 70–140)
POTASSIUM: 3.9 meq/L (ref 3.5–5.1)
SODIUM: 142 meq/L (ref 136–145)
Total Bilirubin: 0.43 mg/dL (ref 0.20–1.20)
Total Protein: 6.6 g/dL (ref 6.4–8.3)

## 2014-10-01 MED ORDER — SODIUM CHLORIDE 0.9 % IV SOLN
INTRAVENOUS | Status: DC
  Administered 2014-10-01: 13:00:00 via INTRAVENOUS

## 2014-10-01 MED ORDER — DEXTROSE 5 % IV SOLN
393.5000 mg/m2 | Freq: Once | INTRAVENOUS | Status: AC
  Administered 2014-10-01: 850 mg via INTRAVENOUS
  Filled 2014-10-01: qty 42.5

## 2014-10-01 MED ORDER — SODIUM CHLORIDE 0.9 % IV SOLN
2400.0000 mg/m2 | INTRAVENOUS | Status: DC
  Administered 2014-10-01: 5200 mg via INTRAVENOUS
  Filled 2014-10-01: qty 104

## 2014-10-01 MED ORDER — OXYCODONE-ACETAMINOPHEN 5-325 MG PO TABS: 1.0000 | ORAL_TABLET | Freq: Four times a day (QID) | ORAL | Status: DC | PRN

## 2014-10-01 MED ORDER — FLUOROURACIL CHEMO INJECTION 2.5 GM/50ML
400.0000 mg/m2 | Freq: Once | INTRAVENOUS | Status: AC
  Administered 2014-10-01: 850 mg via INTRAVENOUS
  Filled 2014-10-01: qty 17

## 2014-10-01 MED ORDER — ALPRAZOLAM 1 MG PO TABS
ORAL_TABLET | ORAL | Status: DC
Start: 2014-10-01 — End: 2014-11-14

## 2014-10-01 MED ORDER — COLCRYS 0.6 MG PO TABS: 0.6000 mg | ORAL_TABLET | Freq: Two times a day (BID) | ORAL | Status: DC

## 2014-10-01 NOTE — Telephone Encounter (Signed)
Gave avs  ° °

## 2014-10-01 NOTE — Patient Instructions (Signed)
[facility] Discharge Instructions for Patients Receiving Chemotherapy  Today you received the following chemotherapy agents 55fu, leucovorin  To help prevent nausea and vomiting after your treatment, we encourage you to take your nausea medication if needed   If you develop nausea and vomiting that is not controlled by your nausea medication, call the clinic.   BELOW ARE SYMPTOMS THAT SHOULD BE REPORTED IMMEDIATELY:  *FEVER GREATER THAN 100.5 F  *CHILLS WITH OR WITHOUT FEVER  NAUSEA AND VOMITING THAT IS NOT CONTROLLED WITH YOUR NAUSEA MEDICATION  *UNUSUAL SHORTNESS OF BREATH  *UNUSUAL BRUISING OR BLEEDING  TENDERNESS IN MOUTH AND THROAT WITH OR WITHOUT PRESENCE OF ULCERS  *URINARY PROBLEMS  *BOWEL PROBLEMS  UNUSUAL RASH Items with * indicate a potential emergency and should be followed up as soon as possible.  Feel free to call the clinic you have any questions or concerns. The clinic phone number is [phone].

## 2014-10-01 NOTE — Progress Notes (Signed)
  [facility] OFFICE PROGRESS NOTE   Diagnosis:  Colon cancer  INTERVAL HISTORY:    Austin Robbins returns as scheduled. He completed another cycle of 5 fluorouracil beginning 09/17/2014. He reports discomfort at the Port-A-Cath site during the 5-FU infusion. None today. He had an acute gout flare at the left knee last week. It took 3 days 4 colchicine to help the pain. The pain has resolved. The left leg edema has improved.    ADoppler of the left leg on 09/17/2014 was negative for DVT.  Objective:  Vital signs in last 24 hours:  Blood pressure 142/70, pulse 62, temperature 97.7 F (36.5 C), temperature source Oral, resp. rate 18, height 6' (1.829 m), weight 239 lb 11.2 oz (108.727 kg), SpO2 99 %.    HEENT:  No thrush or ulcers Resp:  Lungs clear bilaterally Cardio:  Regular rate and rhythm GI:  No hepatomegaly , left lower quadrant colostomy, right lower quadrant urostomy Vascular:  Trace edema at the left lower leg   Portacath/PICC-without erythema  Lab Results:  Lab Results  Component Value Date   WBC 5.4 10/01/2014   HGB 13.8 10/01/2014   HCT 41.4 10/01/2014   MCV 98.1* 10/01/2014   PLT 146 10/01/2014   NEUTROABS 3.2 10/01/2014      Lab Results  Component Value Date   CEA 3.1 06/11/2014     Medications: I have reviewed the patient's current medications.  Assessment/Plan: 1. Clinical stage IV (T6Y,B6L,S9) adenocarcinoma of the rectum with tumor directly extending to the bladder/prostate and CT scan evidence of metastatic chest adenopathy  Positive K-ras mutation.   Normal mismatch repair protein expression. Microsatellite stable   Status post low anterior resection and end colostomy with a cystoprostatectomy and colon conduit urinary diversion 11/01/2013.   Staging PET scan with hypermetabolic pelvic nodes, mediastinal nodes, left adrenal metastasis, and left upper lobe nodule.   Initiation of FOLFOX 12/25/2013.   Restaging CT  03/16/2014 after 6 cycles of FOLFOX confirmed improvement in chest/pelvic lymphadenopathy, a left upper lobe nodule, and resolution of a left adrenal nodule   Oxaliplatin deleted from cycle 8 FOLFOX 04/02/2014 secondary to neuropathy and thrombocytopenia.   Oxaliplatin held with cycle 9 FOLFOX 04/16/2014 due to neuropathy.   Cycle 10 FOLFOX 04/30/2014.   Cycle 11 FOLFOX 05/15/2014.   Cycle 12 FOLFOX 05/28/2014. Oxaliplatin held due to increased neuropathy.   Restaging CT evaluation 06/08/2014 with stable mediastinal and hilar adenopathy. Stable borderline left iliac lymph node. No new or progressive disease identified within the chest, abdomen or pelvis.   "Maintenance" 5-fluorouracil beginning 06/11/2014. 2. Microcytic anemia-likely iron deficiency, improved. He continues oral iron. 3. Gout. 4. Port-A-Cath placement 12/18/2013.  5. Anxiety. He takes Xanax as needed. 6. History of left knee pain and swelling. 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 Trial of Cymbalta initiated 07/09/2014. 8. Thrombocytopenia secondary to chemotherapy. 9. Skin rash 04/30/2014-resolved with doxycycline 10. Chronic edema left lower leg/ankle.  Negative Doppler 09/17/2014   Disposition:  He appears stable. The plan is to complete another cycle of 5-fluorouracil today. He will undergo a restaging CT evaluation prior to a follow-up office visit  10/12/2014.  [name], MD  10/01/2014  11:32 AM

## 2014-10-03 ENCOUNTER — Ambulatory Visit (HOSPITAL_BASED_OUTPATIENT_CLINIC_OR_DEPARTMENT_OTHER): Payer: 59

## 2014-10-03 DIAGNOSIS — C20 Malignant neoplasm of rectum: Secondary | ICD-10-CM

## 2014-10-03 MED ORDER — SODIUM CHLORIDE 0.9 % IJ SOLN
10.0000 mL | INTRAMUSCULAR | Status: DC | PRN
  Administered 2014-10-03: 10 mL
  Filled 2014-10-03: qty 10

## 2014-10-03 MED ORDER — HEPARIN SOD (PORK) LOCK FLUSH 100 UNIT/ML IV SOLN
500.0000 [IU] | Freq: Once | INTRAVENOUS | Status: AC | PRN
  Administered 2014-10-03: 500 [IU]
  Filled 2014-10-03: qty 5

## 2014-10-03 NOTE — Patient Instructions (Signed)
Fluorouracil, 5FU; Diclofenac topical cream What is this medicine? FLUOROURACIL; DICLOFENAC (flure oh YOOR a sil; dye KLOE fen ak) is a combination of a topical chemotherapy agent and non-steroidal anti-inflammatory drug (NSAID). It is used on the skin to treat skin cancer and skin conditions that could become cancer. This medicine may be used for other purposes; ask your health care provider or pharmacist if you have questions. COMMON BRAND NAME(S): FLUORAC What should I tell my health care provider before I take this medicine? They need to know if you have any of these conditions: -bleeding problems -cigarette smoker -DPD enzyme deficiency -heart disease -high blood pressure -if you frequently drink alcohol containing drinks -kidney disease -liver disease -open or infected skin -stomach problems -swelling or open sores at the treatment site -recent or planned coronary artery bypass graft (CABG) surgery -an unusual or allergic reaction to fluorouracil, diclofenac, aspirin, other NSAIDs, other medicines, foods, dyes, or preservatives -pregnant or trying to get pregnant -breast-feeding How should I use this medicine? This medicine is only for use on the skin. Follow the directions on the prescription label. Wash hands before and after use. Wash affected area and gently pat dry. To apply this medicine use a cotton-tipped applicator, or use gloves if applying with fingertips. If applied with unprotected fingertips, it is very important to wash your hands well after you apply this medicine. Avoid applying to the eyes, nose, or mouth. Apply enough medicine to cover the affected area. You can cover the area with a light gauze dressing, but do not use tight or air-tight dressings. Finish the full course prescribed by your doctor or health care professional, even if you think your condition is better. Do not stop taking except on the advice of your doctor or health care professional. Talk to your  pediatrician regarding the use of this medicine in children. Special care may be needed. Overdosage: If you think you've taken too much of this medicine contact a poison control center or emergency room at once. Overdosage: If you think you have taken too much of this medicine contact a poison control center or emergency room at once. NOTE: This medicine is only for you. Do not share this medicine with others. What if I miss a dose? If you miss a dose, apply it as soon as you can. If it is almost time for your next dose, only use that dose. Do not apply extra doses. Contact your doctor or health care professional if you miss more than one dose. What may interact with this medicine? Interactions are not expected. Do not use any other skin products without telling your doctor or health care professional. This list may not describe all possible interactions. Give your health care provider a list of all the medicines, herbs, non-prescription drugs, or dietary supplements you use. Also tell them if you smoke, drink alcohol, or use illegal drugs. Some items may interact with your medicine. What should I watch for while using this medicine? Visit your doctor or health care professional for checks on your progress. You will need to use this medicine for 2 to 6 weeks. This may be longer depending on the condition being treated. You may not see full healing for another 1 to 2 months after you stop using the medicine. Treated areas of skin can look unsightly during and for several weeks after treatment with this medicine. This medicine can make you more sensitive to the sun. Keep out of the sun. If you cannot avoid being in   the sun, wear protective clothing and use sunscreen. Do not use sun lamps or tanning beds/booths. Where should I keep my What side effects may I notice from receiving this medicine? Side effects that you should report to your doctor or health care professional as soon as possible: -allergic  reactions like skin rash, itching or hives, swelling of the face, lips, or tongue -black or bloody stools, blood in the urine or vomit -blurred vision -chest pain -difficulty breathing or wheezing -redness, blistering, peeling or loosening of the skin, including inside the mouth -severe redness and swelling of normal skin -slurred speech or weakness on one side of the body -trouble passing urine or change in the amount of urine -unexplained weight gain or swelling -unusually weak or tired -yellowing of eyes or skin Side effects that usually do not require medical attention (Report these to your doctor or health care professional if they continue or are bothersome.): -increased sensitivity of the skin to sun and ultraviolet light -pain and burning of the affected area -scaling or swelling of the affected area -skin rash, itching of the affected area -tenderness This list may not describe all possible side effects. Call your doctor for medical advice about side effects. You may report side effects to FDA at 1-800-FDA-1088. Where should I keep my medicine? Keep out of the reach of children. Store at room temperature between 20 and 25 degrees C (68 and 77 degrees F). Throw away any unused medicine after the expiration date. NOTE: This sheet is a summary. It may not cover all possible information. If you have questions about this medicine, talk to your doctor, pharmacist, or health care provider.  2015, Elsevier/Gold Standard. (2013-12-25 11:09:58)  

## 2014-10-04 ENCOUNTER — Other Ambulatory Visit: Payer: Self-pay | Admitting: *Deleted

## 2014-10-04 MED ORDER — INDOMETHACIN 50 MG PO CAPS: 50.0000 mg | ORAL_CAPSULE | Freq: Three times a day (TID) | ORAL | Status: DC

## 2014-10-04 NOTE — Telephone Encounter (Signed)
Received message from [facility], Triage RN reporting pt is having a gout flare up. His wife reports he can barely walk. Reviewed with Dr. Benay Spice: Order received for Indocin. Called pt with instructions, he voiced understanding.

## 2014-10-08 ENCOUNTER — Encounter: Payer: Self-pay | Admitting: *Deleted

## 2014-10-08 ENCOUNTER — Encounter: Payer: Self-pay | Admitting: Oncology

## 2014-10-08 NOTE — Progress Notes (Signed)
RECEIVED A FAX FROM [facility] CONCERNING A PRIOR AUTHORIZATION FOR COLCHICINE. THIS REQUEST WAS PLACED IN THE MANAGED CARE BIN.

## 2014-10-08 NOTE — Progress Notes (Signed)
Faxed colchicine pa form to [facility]

## 2014-10-09 ENCOUNTER — Encounter: Payer: Self-pay | Admitting: Oncology

## 2014-10-09 NOTE — Progress Notes (Signed)
[facility] denied colchicine because the patient has not tried and failed mitigare, informed nurse

## 2014-10-10 ENCOUNTER — Encounter (HOSPITAL_COMMUNITY): Payer: Self-pay

## 2014-10-10 ENCOUNTER — Ambulatory Visit (HOSPITAL_COMMUNITY)
Admission: RE | Admit: 2014-10-10 | Discharge: 2014-10-10 | Disposition: A | Discharge status: Home / Self Care | Payer: 59 | Source: Ambulatory Visit | Attending: Oncology | Admitting: Oncology

## 2014-10-10 DIAGNOSIS — C20 Malignant neoplasm of rectum: Secondary | ICD-10-CM | POA: Insufficient documentation

## 2014-10-10 DIAGNOSIS — Z9221 Personal history of antineoplastic chemotherapy: Secondary | ICD-10-CM | POA: Diagnosis not present

## 2014-10-10 MED ORDER — IOHEXOL 300 MG/ML  SOLN
100.0000 mL | Freq: Once | INTRAMUSCULAR | Status: AC | PRN
  Administered 2014-10-10: 100 mL via INTRAVENOUS

## 2014-10-12 ENCOUNTER — Ambulatory Visit (HOSPITAL_BASED_OUTPATIENT_CLINIC_OR_DEPARTMENT_OTHER): Payer: 59 | Admitting: Nurse Practitioner

## 2014-10-12 ENCOUNTER — Other Ambulatory Visit (HOSPITAL_BASED_OUTPATIENT_CLINIC_OR_DEPARTMENT_OTHER): Payer: 59

## 2014-10-12 ENCOUNTER — Telehealth: Payer: Self-pay | Admitting: Nurse Practitioner

## 2014-10-12 VITALS — BP 120/75 | HR 71 | Temp 97.6°F | Resp 18 | Ht 72.0 in | Wt 232.0 lb

## 2014-10-12 DIAGNOSIS — D509 Iron deficiency anemia, unspecified: Secondary | ICD-10-CM

## 2014-10-12 DIAGNOSIS — C20 Malignant neoplasm of rectum: Secondary | ICD-10-CM

## 2014-10-12 DIAGNOSIS — G620 Drug-induced polyneuropathy: Secondary | ICD-10-CM

## 2014-10-12 DIAGNOSIS — D6959 Other secondary thrombocytopenia: Secondary | ICD-10-CM

## 2014-10-12 DIAGNOSIS — R609 Edema, unspecified: Secondary | ICD-10-CM

## 2014-10-12 LAB — CBC WITH DIFFERENTIAL/PLATELET
BASO%: 0.7 % (ref 0.0–2.0)
Basophils Absolute: 0 10*3/uL (ref 0.0–0.1)
EOS%: 3.2 % (ref 0.0–7.0)
Eosinophils Absolute: 0.2 10*3/uL (ref 0.0–0.5)
HEMATOCRIT: 43.2 % (ref 38.4–49.9)
HGB: 14.7 g/dL (ref 13.0–17.1)
LYMPH%: 35.9 % (ref 14.0–49.0)
MCH: 33.6 pg — AB (ref 27.2–33.4)
MCHC: 34.2 g/dL (ref 32.0–36.0)
MCV: 98.3 fL — AB (ref 79.3–98.0)
MONO#: 0.5 10*3/uL (ref 0.1–0.9)
MONO%: 10.9 % (ref 0.0–14.0)
NEUT%: 49.3 % (ref 39.0–75.0)
NEUTROS ABS: 2.4 10*3/uL (ref 1.5–6.5)
PLATELETS: 205 10*3/uL (ref 140–400)
RBC: 4.39 10*6/uL (ref 4.20–5.82)
RDW: 14.9 % — AB (ref 11.0–14.6)
WBC: 4.9 10*3/uL (ref 4.0–10.3)
lymph#: 1.8 10*3/uL (ref 0.9–3.3)

## 2014-10-12 LAB — COMPREHENSIVE METABOLIC PANEL (CC13)
ALBUMIN: 3.7 g/dL (ref 3.5–5.0)
ALT: 14 U/L (ref 0–55)
ANION GAP: 10 meq/L (ref 3–11)
AST: 18 U/L (ref 5–34)
Alkaline Phosphatase: 97 U/L (ref 40–150)
BUN: 14.1 mg/dL (ref 7.0–26.0)
CALCIUM: 8.6 mg/dL (ref 8.4–10.4)
CHLORIDE: 110 meq/L — AB (ref 98–109)
CO2: 21 meq/L — AB (ref 22–29)
CREATININE: 0.9 mg/dL (ref 0.7–1.3)
EGFR: >90 mL/min/{1.73_m2} (ref >90)
Glucose: 99 mg/dl (ref 70–140)
Potassium: 3.8 mEq/L (ref 3.5–5.1)
Sodium: 140 mEq/L (ref 136–145)
TOTAL PROTEIN: 7.1 g/dL (ref 6.4–8.3)
Total Bilirubin: 0.52 mg/dL (ref 0.20–1.20)

## 2014-10-12 LAB — CEA: CEA: 2.9 ng/mL (ref 0.0–5.0)

## 2014-10-12 NOTE — Telephone Encounter (Signed)
Pt confirmed labs/ov per 02/05 POF, gave pt AVS... KJ  °

## 2014-10-12 NOTE — Progress Notes (Addendum)
[place] OFFICE PROGRESS NOTE   Diagnosis:  Colon cancer  INTERVAL HISTORY:   Austin Robbins returns as scheduled. He completed another cycle of 5-fluorouracil on 10/01/2014. He denies nausea/vomiting. No mouth sores. No diarrhea. He notes his hands are dry. He is applying lotion frequently. He has a good appetite. He periodically has pain at the left inguinal region..  Objective:  Vital signs in last 24 hours:  Blood pressure 120/75, pulse 71, temperature 97.6 F (36.4 C), temperature source Oral, resp. rate 18, height 6' (1.829 m), weight 232 lb (105.235 kg), SpO2 99 %.    HEENT: Posterior palate is erythematous. No ulcers. No thrush. Resp: Lungs clear bilaterally. Cardio: Regular rate and rhythm. GI: Abdomen soft and nontender. Left lower quadrant colostomy. Right lower quadrant urostomy. Vascular: No leg edema.  Skin: Palms dry appearing with mild erythema. Port-A-Cath without erythema.    Lab Results:  Lab Results  Component Value Date   WBC 4.9 10/12/2014   HGB 14.7 10/12/2014   HCT 43.2 10/12/2014   MCV 98.3* 10/12/2014   PLT 205 10/12/2014   NEUTROABS 2.4 10/12/2014    Imaging:  Ct Chest W Contrast  10/10/2014   CLINICAL DATA:  Restaging of rectal adenocarcinoma, with metastatic disease to the bladder lymph nodes diagnosed in 2015. Completed chemotherapy cycle in January 2016. Low anterior resection 11/04/2013.  EXAM: CT CHEST, ABDOMEN, AND PELVIS WITH CONTRAST  TECHNIQUE: Multidetector CT imaging of the chest, abdomen and pelvis was performed following the standard protocol during bolus administration of intravenous contrast.  CONTRAST:  155m OMNIPAQUE IOHEXOL 300 MG/ML  SOLN  COMPARISON:  Multiple exams, including 06/09/2015  FINDINGS: CT CHEST FINDINGS  Mediastinum/Nodes: Left paratracheal lymph node 1.1 cm in short axis on image 28 series 2, formerly the same by my measurements. Right hilar node 1.1 cm in short axis on image 31 series 2, stable.  Subcarinal lymph node 1.3 cm in short axis on image 37 series 2, stable. Right infrahilar node 1.1 cm on image 37 series 2, stable. Vascular or nodal tissue observed anterior to the mildly thickened distal esophagus. Similar to prior.  Left Main and left anterior descending coronary artery atherosclerotic calcification.  Lungs/Pleura: Unremarkable. On prior PET-CT the patient had a hypermetabolic lung nodule in the left upper lobe at the level of the carina, but I no longer see this nodule.  Musculoskeletal: Unremarkable  CT ABDOMEN PELVIS FINDINGS  Hepatobiliary: Unremarkable  Pancreas: Unremarkable  Spleen: Unremarkable  Adrenals/Urinary Tract: A previously hypermetabolic lateral limb left adrenal mass was shown on prior PET-CT, but is no longer apparent. The thickness of the lateral limb is approximately 7 mm. No renal abnormality. Urinary diversion with the ureters attaching to a sigmoid loop extending to a right-sided ostomy.  Stomach/Bowel: Left colostomy with rectal resection. Sigmoid pouch for urinary diversion.  Vascular/Lymphatic: Aortoiliac atherosclerotic vascular disease. Scattered small inguinal lymph nodes are present bilaterally a left external iliac node measures 8 mm in short axis on image 118 series 2. A mostly fatty left inguinal lymph node measures 1.1 cm in short axis, image 132 series 2. No overtly pathologic pelvic adenopathy observed.  Reproductive: A right hydrocele is partially visualized. The prostate gland is absent.  Other: No supplemental non-categorized findings.  Musculoskeletal: Mild sclerosis along the iliac sides of the sacroiliac joints. Mild chondrocalcinosis in the acetabular labra. Lower lumbar degenerative disc disease.  IMPRESSION: 1. If fairly similar appearance to the prior CT exam, with similar mild mediastinal and right hilar adenopathy.  No visible recurrence of the prior left upper lobe metastatic lesion or the left adrenal metastatic lesion. No new metastatic lesion is  observed. 2. Ancillary findings include Coronary artery atherosclerosis, a small right hydrocele, and chondrocalcinosis in the acetabular labra.   Electronically Signed   By: Sherryl Barters M.D.   On: 10/10/2014 12:49   Ct Abdomen Pelvis W Contrast  10/10/2014   CLINICAL DATA:  Restaging of rectal adenocarcinoma, with metastatic disease to the bladder lymph nodes diagnosed in 2015. Completed chemotherapy cycle in January 2016. Low anterior resection 11/04/2013.  EXAM: CT CHEST, ABDOMEN, AND PELVIS WITH CONTRAST  TECHNIQUE: Multidetector CT imaging of the chest, abdomen and pelvis was performed following the standard protocol during bolus administration of intravenous contrast.  CONTRAST:  19m OMNIPAQUE IOHEXOL 300 MG/ML  SOLN  COMPARISON:  Multiple exams, including 06/09/2015  FINDINGS: CT CHEST FINDINGS  Mediastinum/Nodes: Left paratracheal lymph node 1.1 cm in short axis on image 28 series 2, formerly the same by my measurements. Right hilar node 1.1 cm in short axis on image 31 series 2, stable. Subcarinal lymph node 1.3 cm in short axis on image 37 series 2, stable. Right infrahilar node 1.1 cm on image 37 series 2, stable. Vascular or nodal tissue observed anterior to the mildly thickened distal esophagus. Similar to prior.  Left Main and left anterior descending coronary artery atherosclerotic calcification.  Lungs/Pleura: Unremarkable. On prior PET-CT the patient had a hypermetabolic lung nodule in the left upper lobe at the level of the carina, but I no longer see this nodule.  Musculoskeletal: Unremarkable  CT ABDOMEN PELVIS FINDINGS  Hepatobiliary: Unremarkable  Pancreas: Unremarkable  Spleen: Unremarkable  Adrenals/Urinary Tract: A previously hypermetabolic lateral limb left adrenal mass was shown on prior PET-CT, but is no longer apparent. The thickness of the lateral limb is approximately 7 mm. No renal abnormality. Urinary diversion with the ureters attaching to a sigmoid loop extending to a  right-sided ostomy.  Stomach/Bowel: Left colostomy with rectal resection. Sigmoid pouch for urinary diversion.  Vascular/Lymphatic: Aortoiliac atherosclerotic vascular disease. Scattered small inguinal lymph nodes are present bilaterally a left external iliac node measures 8 mm in short axis on image 118 series 2. A mostly fatty left inguinal lymph node measures 1.1 cm in short axis, image 132 series 2. No overtly pathologic pelvic adenopathy observed.  Reproductive: A right hydrocele is partially visualized. The prostate gland is absent.  Other: No supplemental non-categorized findings.  Musculoskeletal: Mild sclerosis along the iliac sides of the sacroiliac joints. Mild chondrocalcinosis in the acetabular labra. Lower lumbar degenerative disc disease.  IMPRESSION: 1. If fairly similar appearance to the prior CT exam, with similar mild mediastinal and right hilar adenopathy. No visible recurrence of the prior left upper lobe metastatic lesion or the left adrenal metastatic lesion. No new metastatic lesion is observed. 2. Ancillary findings include Coronary artery atherosclerosis, a small right hydrocele, and chondrocalcinosis in the acetabular labra.   Electronically Signed   By: WSherryl BartersM.D.   On: 10/10/2014 12:49    Medications: I have reviewed the patient's current medications.  Assessment/Plan: 1. Clinical stage IV ((O3J,K0X,F8 adenocarcinoma of the rectum with tumor directly extending to the bladder/prostate and CT scan evidence of metastatic chest adenopathy  Positive K-ras mutation.   Normal mismatch repair protein expression. Microsatellite stable   Status post low anterior resection and end colostomy with a cystoprostatectomy and colon conduit urinary diversion 11/01/2013.   Staging PET scan with hypermetabolic pelvic nodes, mediastinal nodes, left adrenal  metastasis, and left upper lobe nodule.   Initiation of FOLFOX 12/25/2013.   Restaging CT 03/16/2014 after 6 cycles of  FOLFOX confirmed improvement in chest/pelvic lymphadenopathy, a left upper lobe nodule, and resolution of a left adrenal nodule   Oxaliplatin deleted from cycle 8 FOLFOX 04/02/2014 secondary to neuropathy and thrombocytopenia.   Oxaliplatin held with cycle 9 FOLFOX 04/16/2014 due to neuropathy.   Cycle 10 FOLFOX 04/30/2014.   Cycle 11 FOLFOX 05/15/2014.   Cycle 12 FOLFOX 05/28/2014. Oxaliplatin held due to increased neuropathy.   Restaging CT evaluation 06/08/2014 with stable mediastinal and hilar adenopathy. Stable borderline left iliac lymph node. No new or progressive disease identified within the chest, abdomen or pelvis.   "Maintenance" 5-fluorouracil beginning 06/11/2014.  Restaging CT evaluation 10/10/2014 showed similar mild mediastinal and right hilar adenopathy. No visible recurrence of the prior left upper lobe metastatic lesion or the left adrenal metastatic lesion. No new lesions noted. 2. Microcytic anemia-likely iron deficiency, improved. He continues oral iron. 3. Gout. 4. Port-A-Cath placement 12/18/2013.  5. Anxiety. He takes Xanax as needed. 6. History of left knee pain and swelling. 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 Trial of Cymbalta initiated 07/09/2014. 8. Thrombocytopenia secondary to chemotherapy. 9. Skin rash 04/30/2014-resolved with doxycycline 10. Chronic edema left lower leg/ankle. Negative Doppler 09/17/2014   Disposition: Austin Robbins appears stable. The recent CT scan shows an ongoing response. Dr. Benay Spice reviewed the CT results with Austin Robbins and his family and recommends continuation of maintenance 5 fluorouracil. We discussed switching to Xeloda on a 1 week on/1 week off schedule. We reviewed potential toxicities associated with Xeloda including mouth sores, nausea, diarrhea, hand-foot syndrome, rash. He would like to proceed with Xeloda. We anticipate he will begin the  first cycle on 10/22/2014. We will see him in follow-up on 11/14/2014. He will contact the office in the interim with any problems.  Patient seen with Dr. Benay Spice.    [name] ANP/GNP-BC   10/12/2014  9:42 AM This was a shared visit with [name]. The restaging CT shows no evidence of disease progression. The plan is to continue maintenance 5 fluorouracil. He would like to switch to capecitabine maintenance. We reviewed the potential toxicities associated with capecitabine and he agrees to proceed.  Julieanne Manson, M.D.

## 2014-10-16 ENCOUNTER — Encounter: Payer: Self-pay | Admitting: Oncology

## 2014-10-16 ENCOUNTER — Other Ambulatory Visit: Payer: Self-pay

## 2014-10-16 MED ORDER — CAPECITABINE 500 MG PO TABS: ORAL_TABLET | ORAL | Status: DC

## 2014-10-16 NOTE — Progress Notes (Signed)
Faxed xeloda prescription to [facility] °

## 2014-10-31 ENCOUNTER — Telehealth: Payer: Self-pay | Admitting: *Deleted

## 2014-10-31 NOTE — Telephone Encounter (Signed)
Patient has broken a molar.  Wife called to ask if it is okay for him to have the dentist look at the tooth and have his teeth cleaned.  Advised that he should see the dentist right away about the broken tooth.  Once a treatment plan for that tooth is made, his dentist should call Dr. Benay Spice to discuss the treatment plan and decide if any action is required on our part.  Assured her the dentist would not clean his teeth at the same visit, so that could be discussed later.  She was in full agreement with that plan.

## 2014-11-06 ENCOUNTER — Encounter: Payer: Self-pay | Admitting: Internal Medicine

## 2014-11-14 ENCOUNTER — Telehealth: Payer: Self-pay | Admitting: Oncology

## 2014-11-14 ENCOUNTER — Other Ambulatory Visit (HOSPITAL_BASED_OUTPATIENT_CLINIC_OR_DEPARTMENT_OTHER): Payer: 59

## 2014-11-14 ENCOUNTER — Ambulatory Visit (HOSPITAL_BASED_OUTPATIENT_CLINIC_OR_DEPARTMENT_OTHER): Payer: 59 | Admitting: Oncology

## 2014-11-14 ENCOUNTER — Other Ambulatory Visit: Payer: Self-pay | Admitting: *Deleted

## 2014-11-14 VITALS — BP 130/83 | HR 65 | Temp 98.3°F | Resp 20 | Ht 72.0 in | Wt 236.5 lb

## 2014-11-14 DIAGNOSIS — F419 Anxiety disorder, unspecified: Secondary | ICD-10-CM

## 2014-11-14 DIAGNOSIS — Z23 Encounter for immunization: Secondary | ICD-10-CM

## 2014-11-14 DIAGNOSIS — G620 Drug-induced polyneuropathy: Secondary | ICD-10-CM

## 2014-11-14 DIAGNOSIS — D6959 Other secondary thrombocytopenia: Secondary | ICD-10-CM

## 2014-11-14 DIAGNOSIS — D509 Iron deficiency anemia, unspecified: Secondary | ICD-10-CM

## 2014-11-14 DIAGNOSIS — R609 Edema, unspecified: Secondary | ICD-10-CM

## 2014-11-14 DIAGNOSIS — Z95828 Presence of other vascular implants and grafts: Secondary | ICD-10-CM

## 2014-11-14 DIAGNOSIS — C20 Malignant neoplasm of rectum: Secondary | ICD-10-CM

## 2014-11-14 LAB — COMPREHENSIVE METABOLIC PANEL (CC13)
ALBUMIN: 3.5 g/dL (ref 3.5–5.0)
ALT: 12 U/L (ref 0–55)
AST: 16 U/L (ref 5–34)
Alkaline Phosphatase: 82 U/L (ref 40–150)
Anion Gap: 9 mEq/L (ref 3–11)
BILIRUBIN TOTAL: 0.59 mg/dL (ref 0.20–1.20)
BUN: 14.8 mg/dL (ref 7.0–26.0)
CHLORIDE: 111 meq/L — AB (ref 98–109)
CO2: 18 meq/L — AB (ref 22–29)
CREATININE: 0.8 mg/dL (ref 0.7–1.3)
Calcium: 9 mg/dL (ref 8.4–10.4)
EGFR: >90 mL/min/{1.73_m2} (ref >90)
Glucose: 131 mg/dl (ref 70–140)
Potassium: 4.1 mEq/L (ref 3.5–5.1)
Sodium: 139 mEq/L (ref 136–145)
TOTAL PROTEIN: 6.7 g/dL (ref 6.4–8.3)

## 2014-11-14 LAB — CBC WITH DIFFERENTIAL/PLATELET
BASO%: 0.2 % (ref 0.0–2.0)
BASOS ABS: 0 10*3/uL (ref 0.0–0.1)
EOS%: 3.2 % (ref 0.0–7.0)
Eosinophils Absolute: 0.2 10*3/uL (ref 0.0–0.5)
HCT: 41.3 % (ref 38.4–49.9)
HGB: 14.6 g/dL (ref 13.0–17.1)
LYMPH%: 29.7 % (ref 14.0–49.0)
MCH: 33.6 pg — AB (ref 27.2–33.4)
MCHC: 35.4 g/dL (ref 32.0–36.0)
MCV: 95.2 fL (ref 79.3–98.0)
MONO#: 0.4 10*3/uL (ref 0.1–0.9)
MONO%: 6.6 % (ref 0.0–14.0)
NEUT%: 60.3 % (ref 39.0–75.0)
NEUTROS ABS: 3.7 10*3/uL (ref 1.5–6.5)
Platelets: 148 10*3/uL (ref 140–400)
RBC: 4.34 10*6/uL (ref 4.20–5.82)
RDW: 14.6 % (ref 11.0–14.6)
WBC: 6.2 10*3/uL (ref 4.0–10.3)
lymph#: 1.8 10*3/uL (ref 0.9–3.3)

## 2014-11-14 MED ORDER — HEPARIN SOD (PORK) LOCK FLUSH 100 UNIT/ML IV SOLN
500.0000 [IU] | Freq: Once | INTRAVENOUS | Status: AC
  Administered 2014-11-14: 500 [IU] via INTRAVENOUS
  Filled 2014-11-14: qty 5

## 2014-11-14 MED ORDER — SODIUM CHLORIDE 0.9 % IJ SOLN
10.0000 mL | INTRAMUSCULAR | Status: AC | PRN
  Administered 2014-11-14: 10 mL via INTRAVENOUS
  Filled 2014-11-14: qty 10

## 2014-11-14 MED ORDER — ALPRAZOLAM 1 MG PO TABS: ORAL_TABLET | ORAL | Status: DC

## 2014-11-14 MED ORDER — CAPECITABINE 500 MG PO TABS: ORAL_TABLET | ORAL | Status: DC

## 2014-11-14 NOTE — Telephone Encounter (Signed)
gv and rpinted appt sched and avs fo rpt for April

## 2014-11-14 NOTE — Addendum Note (Signed)
Addended by: Daisy Floro R on: 11/14/2014 03:29 PM   Modules accepted: Orders, SmartSet

## 2014-11-14 NOTE — Progress Notes (Signed)
[place] OFFICE PROGRESS NOTE   Diagnosis: Rectal cancer  INTERVAL HISTORY:   [name] returns as scheduled. He has completed 2 weeks of Xeloda treatment. No mouth sores or diarrhea. No hand/foot pain. He continues to have numbness in the fingers and feet.  Objective:  Vital signs in last 24 hours:  Blood pressure 130/83, pulse 65, temperature 98.3 F (36.8 C), temperature source Oral, resp. rate 20, height 6' (1.829 m), weight 236 lb 8 oz (107.276 kg), SpO2 98 %.    HEENT: No thrush or ulcers Resp: Lungs clear bilaterally Cardio: Regular 8 and rhythm GI: No hepatosplenomegaly, nontender, lower abdomen urostomy and colostomy Vascular: Trace pretibial edema bilaterally  Skin: Erythema of the face, plaque-like erythematous rash at the lower leg bilaterally   Portacath/PICC-without erythema  Lab Results:  Lab Results  Component Value Date   WBC 6.2 11/14/2014   HGB 14.6 11/14/2014   HCT 41.3 11/14/2014   MCV 95.2 11/14/2014   PLT 148 11/14/2014   NEUTROABS 3.7 11/14/2014      Lab Results  Component Value Date   CEA 2.9 10/12/2014    Imaging:  No results found.  Medications: I have reviewed the patient's current medications.  Assessment/Plan: 1. Clinical stage IV (T6Y,B6L,S9) adenocarcinoma of the rectum with tumor directly extending to the bladder/prostate and CT scan evidence of metastatic chest adenopathy  Positive K-ras mutation.   Normal mismatch repair protein expression. Microsatellite stable   Status post low anterior resection and end colostomy with a cystoprostatectomy and colon conduit urinary diversion 11/01/2013.   Staging PET scan with hypermetabolic pelvic nodes, mediastinal nodes, left adrenal metastasis, and left upper lobe nodule.   Initiation of FOLFOX 12/25/2013.   Restaging CT 03/16/2014 after 6 cycles of FOLFOX confirmed improvement in chest/pelvic lymphadenopathy, a left upper lobe nodule, and resolution of  a left adrenal nodule   Oxaliplatin deleted from cycle 8 FOLFOX 04/02/2014 secondary to neuropathy and thrombocytopenia.   Oxaliplatin held with cycle 9 FOLFOX 04/16/2014 due to neuropathy.   Cycle 10 FOLFOX 04/30/2014.   Cycle 11 FOLFOX 05/15/2014.   Cycle 12 FOLFOX 05/28/2014. Oxaliplatin held due to increased neuropathy.   Restaging CT evaluation 06/08/2014 with stable mediastinal and hilar adenopathy. Stable borderline left iliac lymph node. No new or progressive disease identified within the chest, abdomen or pelvis.   "Maintenance" 5-fluorouracil beginning 06/11/2014.  Restaging CT evaluation 10/10/2014 showed similar mild mediastinal and right hilar adenopathy. No visible recurrence of the prior left upper lobe metastatic lesion or the left adrenal metastatic lesion. No new lesions noted.  Maintenance Xeloda on a 7 day on/7 day off schedule beginning 10/25/2014 2. Microcytic anemia-likely iron deficiency, improved. He continues oral iron. 3. Gout. 4. Port-A-Cath placement 12/18/2013.  5. Anxiety. He takes Xanax as needed. 6. History of left knee pain and swelling. 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 Trial of Cymbalta initiated 07/09/2014. 8. Thrombocytopenia secondary to chemotherapy. 9. Skin rash 04/30/2014-resolved with doxycycline 10. Chronic edema left lower leg/ankle. Negative Doppler 09/17/2014  Disposition:  Mr. Bonelli appears to be tolerating the Xeloda well. He'll continue Xeloda on a 7 day on/7 day off schedule. He has a sunburn of the face. I cautioned him against heavy sun exposure while taking Xeloda. He will use a hat and sunscreen.  Mr. Ransome will return for an office visit and Port-A-Cath flush in one month.  [name], MD  11/14/2014  11:19 AM

## 2014-11-14 NOTE — Patient Instructions (Signed)

## 2014-11-19 ENCOUNTER — Telehealth: Payer: Self-pay

## 2014-11-19 NOTE — Telephone Encounter (Signed)
Fax xeloda shipped 11/16/14

## 2014-11-20 ENCOUNTER — Telehealth: Payer: Self-pay

## 2014-11-21 NOTE — Telephone Encounter (Signed)
error 

## 2014-12-11 ENCOUNTER — Telehealth: Payer: Self-pay | Admitting: *Deleted

## 2014-12-11 NOTE — Telephone Encounter (Signed)
Received refill request for Xeloda. Taken to Dr Benay Spice for signature

## 2014-12-12 ENCOUNTER — Other Ambulatory Visit (HOSPITAL_BASED_OUTPATIENT_CLINIC_OR_DEPARTMENT_OTHER): Payer: 59

## 2014-12-12 ENCOUNTER — Ambulatory Visit (HOSPITAL_BASED_OUTPATIENT_CLINIC_OR_DEPARTMENT_OTHER): Payer: 59 | Admitting: Nurse Practitioner

## 2014-12-12 ENCOUNTER — Ambulatory Visit: Payer: 59

## 2014-12-12 ENCOUNTER — Telehealth: Payer: Self-pay | Admitting: Oncology

## 2014-12-12 VITALS — BP 143/90 | HR 54 | Temp 97.7°F | Resp 20 | Ht 72.0 in | Wt 243.3 lb

## 2014-12-12 DIAGNOSIS — R600 Localized edema: Secondary | ICD-10-CM

## 2014-12-12 DIAGNOSIS — D6959 Other secondary thrombocytopenia: Secondary | ICD-10-CM

## 2014-12-12 DIAGNOSIS — D509 Iron deficiency anemia, unspecified: Secondary | ICD-10-CM

## 2014-12-12 DIAGNOSIS — C20 Malignant neoplasm of rectum: Secondary | ICD-10-CM | POA: Diagnosis not present

## 2014-12-12 DIAGNOSIS — C7972 Secondary malignant neoplasm of left adrenal gland: Secondary | ICD-10-CM

## 2014-12-12 DIAGNOSIS — Z95828 Presence of other vascular implants and grafts: Secondary | ICD-10-CM

## 2014-12-12 DIAGNOSIS — Z23 Encounter for immunization: Secondary | ICD-10-CM

## 2014-12-12 LAB — CBC WITH DIFFERENTIAL/PLATELET
BASO%: 0.2 % (ref 0.0–2.0)
Basophils Absolute: 0 10*3/uL (ref 0.0–0.1)
EOS ABS: 0.2 10*3/uL (ref 0.0–0.5)
EOS%: 2.9 % (ref 0.0–7.0)
HEMATOCRIT: 42.3 % (ref 38.4–49.9)
HEMOGLOBIN: 15.1 g/dL (ref 13.0–17.1)
LYMPH%: 31.8 % (ref 14.0–49.0)
MCH: 34.9 pg — AB (ref 27.2–33.4)
MCHC: 35.7 g/dL (ref 32.0–36.0)
MCV: 97.7 fL (ref 79.3–98.0)
MONO#: 0.5 10*3/uL (ref 0.1–0.9)
MONO%: 7.7 % (ref 0.0–14.0)
NEUT%: 57.4 % (ref 39.0–75.0)
NEUTROS ABS: 3.4 10*3/uL (ref 1.5–6.5)
PLATELETS: 157 10*3/uL (ref 140–400)
RBC: 4.33 10*6/uL (ref 4.20–5.82)
RDW: 16 % — AB (ref 11.0–14.6)
WBC: 5.9 10*3/uL (ref 4.0–10.3)
lymph#: 1.9 10*3/uL (ref 0.9–3.3)

## 2014-12-12 LAB — COMPREHENSIVE METABOLIC PANEL (CC13)
ALBUMIN: 3.7 g/dL (ref 3.5–5.0)
ALT: 14 U/L (ref 0–55)
ANION GAP: 12 meq/L — AB (ref 3–11)
AST: 19 U/L (ref 5–34)
Alkaline Phosphatase: 93 U/L (ref 40–150)
BUN: 11 mg/dL (ref 7.0–26.0)
CALCIUM: 8.9 mg/dL (ref 8.4–10.4)
CHLORIDE: 109 meq/L (ref 98–109)
CO2: 20 mEq/L — ABNORMAL LOW (ref 22–29)
Creatinine: 0.8 mg/dL (ref 0.7–1.3)
GLUCOSE: 106 mg/dL (ref 70–140)
POTASSIUM: 4.2 meq/L (ref 3.5–5.1)
Sodium: 140 mEq/L (ref 136–145)
Total Bilirubin: 0.62 mg/dL (ref 0.20–1.20)
Total Protein: 6.8 g/dL (ref 6.4–8.3)

## 2014-12-12 MED ORDER — SODIUM CHLORIDE 0.9 % IJ SOLN
10.0000 mL | INTRAMUSCULAR | Status: DC | PRN
  Administered 2014-12-12: 10 mL via INTRAVENOUS
  Filled 2014-12-12: qty 10

## 2014-12-12 MED ORDER — OXYCODONE-ACETAMINOPHEN 5-325 MG PO TABS: 1.0000 | ORAL_TABLET | Freq: Four times a day (QID) | ORAL | Status: DC | PRN

## 2014-12-12 MED ORDER — HEPARIN SOD (PORK) LOCK FLUSH 100 UNIT/ML IV SOLN
500.0000 [IU] | Freq: Once | INTRAVENOUS | Status: AC
  Administered 2014-12-12: 500 [IU] via INTRAVENOUS
  Filled 2014-12-12: qty 5

## 2014-12-12 MED ORDER — ALPRAZOLAM 1 MG PO TABS: ORAL_TABLET | ORAL | Status: DC

## 2014-12-12 NOTE — Telephone Encounter (Signed)
gave and printed appt sched and avs fo rpt for May °

## 2014-12-12 NOTE — Patient Instructions (Signed)

## 2014-12-12 NOTE — Progress Notes (Signed)
[place] OFFICE PROGRESS NOTE   Diagnosis:  Rectal cancer  INTERVAL HISTORY:   Austin Robbins returns as scheduled. He continues Xeloda 1 week on/1 week off. He is "queasy" intermittently. No vomiting. No diarrhea. No mouth sores. He has persistent numbness in the fingertips and feet. He notes his hands are dry. He has increased leg swelling. He thinks the leg swelling is due to being on his feet for prolonged periods of time due to his current work schedule. He denies calf pain. No shortness of breath. He has an occasional cough. No fever.  Objective:  Vital signs in last 24 hours:  Blood pressure 143/90, pulse 54, temperature 97.7 F (36.5 C), temperature source Oral, resp. rate 20, height 6' (1.829 m), weight 243 lb 4.8 oz (110.36 kg), SpO2 100 %.    HEENT: No thrush or ulcers. Resp: Lungs clear bilaterally. Cardio: Regular rate and rhythm. GI: Abdomen soft and nontender. No hepatomegaly. No mass. Lower abdomen urostomy and colostomy. Vascular: 2+ pitting edema at the lower legs bilaterally.  Skin: Erythema of the face. Palms and soles have a dry appearance. No erythema.  Port-A-Cath without erythema.  Lab Results:  Lab Results  Component Value Date   WBC 5.9 12/12/2014   HGB 15.1 12/12/2014   HCT 42.3 12/12/2014   MCV 97.7 12/12/2014   PLT 157 12/12/2014   NEUTROABS 3.4 12/12/2014    Imaging:  No results found.  Medications: I have reviewed the patient's current medications.  Assessment/Plan: 1. Clinical stage IV (N0U,V2Z,D6) adenocarcinoma of the rectum with tumor directly extending to the bladder/prostate and CT scan evidence of metastatic chest adenopathy  Positive K-ras mutation.   Normal mismatch repair protein expression. Microsatellite stable   Status post low anterior resection and end colostomy with a cystoprostatectomy and colon conduit urinary diversion 11/01/2013.   Staging PET scan with hypermetabolic pelvic nodes, mediastinal  nodes, left adrenal metastasis, and left upper lobe nodule.   Initiation of FOLFOX 12/25/2013.   Restaging CT 03/16/2014 after 6 cycles of FOLFOX confirmed improvement in chest/pelvic lymphadenopathy, a left upper lobe nodule, and resolution of a left adrenal nodule   Oxaliplatin deleted from cycle 8 FOLFOX 04/02/2014 secondary to neuropathy and thrombocytopenia.   Oxaliplatin held with cycle 9 FOLFOX 04/16/2014 due to neuropathy.   Cycle 10 FOLFOX 04/30/2014.   Cycle 11 FOLFOX 05/15/2014.   Cycle 12 FOLFOX 05/28/2014. Oxaliplatin held due to increased neuropathy.   Restaging CT evaluation 06/08/2014 with stable mediastinal and hilar adenopathy. Stable borderline left iliac lymph node. No new or progressive disease identified within the chest, abdomen or pelvis.   "Maintenance" 5-fluorouracil beginning 06/11/2014.  Restaging CT evaluation 10/10/2014 showed similar mild mediastinal and right hilar adenopathy. No visible recurrence of the prior left upper lobe metastatic lesion or the left adrenal metastatic lesion. No new lesions noted.  Maintenance Xeloda on a 7 day on/7 day off schedule beginning 10/25/2014 2. Microcytic anemia-likely iron deficiency, improved. He continues oral iron. 3. Gout. 4. Port-A-Cath placement 12/18/2013.  5. Anxiety. He takes Xanax as needed. 6. History of left knee pain and swelling. 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 Trial of Cymbalta initiated 07/09/2014. 8. Thrombocytopenia secondary to chemotherapy. 9. Skin rash 04/30/2014-resolved with doxycycline 10. Chronic edema left lower leg/ankle. Negative Doppler 09/17/2014   Disposition: [name] appears stable. Plan to continue Xeloda 1 week on/1 week off. We discussed the increased sensitivity to sun associated with Xeloda.  We recommended sunscreen and a hat and cautioned against excessive sun exposure.  For the leg  edema he will try elevating his legs when he is not working. He will also obtain support stockings. He understands to contact the office if the edema increases.  He will return for a follow-up visit and Port-A-Cath flush in 4 weeks. He will contact the office in the interim as outlined above or with any other problems.  Plan reviewed with Dr. Benay Spice.    [name] ANP/GNP-BC   12/12/2014  9:52 AM

## 2014-12-17 NOTE — Telephone Encounter (Signed)
Confirmation of shipment of xeloda estimated delivery date 12/13/14.

## 2015-01-09 ENCOUNTER — Ambulatory Visit (HOSPITAL_BASED_OUTPATIENT_CLINIC_OR_DEPARTMENT_OTHER): Payer: 59 | Admitting: Oncology

## 2015-01-09 ENCOUNTER — Other Ambulatory Visit (HOSPITAL_BASED_OUTPATIENT_CLINIC_OR_DEPARTMENT_OTHER): Payer: 59

## 2015-01-09 ENCOUNTER — Ambulatory Visit: Payer: 59

## 2015-01-09 ENCOUNTER — Telehealth: Payer: Self-pay | Admitting: Oncology

## 2015-01-09 ENCOUNTER — Telehealth: Payer: Self-pay | Admitting: *Deleted

## 2015-01-09 VITALS — BP 146/86 | HR 67 | Temp 97.8°F | Resp 18 | Ht 72.0 in | Wt 234.3 lb

## 2015-01-09 DIAGNOSIS — C7972 Secondary malignant neoplasm of left adrenal gland: Secondary | ICD-10-CM

## 2015-01-09 DIAGNOSIS — C20 Malignant neoplasm of rectum: Secondary | ICD-10-CM | POA: Diagnosis not present

## 2015-01-09 DIAGNOSIS — R609 Edema, unspecified: Secondary | ICD-10-CM

## 2015-01-09 DIAGNOSIS — G629 Polyneuropathy, unspecified: Secondary | ICD-10-CM | POA: Diagnosis not present

## 2015-01-09 DIAGNOSIS — Z95828 Presence of other vascular implants and grafts: Secondary | ICD-10-CM

## 2015-01-09 DIAGNOSIS — D509 Iron deficiency anemia, unspecified: Secondary | ICD-10-CM | POA: Diagnosis not present

## 2015-01-09 DIAGNOSIS — F419 Anxiety disorder, unspecified: Secondary | ICD-10-CM

## 2015-01-09 LAB — CBC WITH DIFFERENTIAL/PLATELET
BASO%: 0.8 % (ref 0.0–2.0)
BASOS ABS: 0.1 10*3/uL (ref 0.0–0.1)
EOS%: 2.9 % (ref 0.0–7.0)
Eosinophils Absolute: 0.2 10*3/uL (ref 0.0–0.5)
HEMATOCRIT: 43.6 % (ref 38.4–49.9)
HGB: 15.1 g/dL (ref 13.0–17.1)
LYMPH%: 23.4 % (ref 14.0–49.0)
MCH: 34.8 pg — ABNORMAL HIGH (ref 27.2–33.4)
MCHC: 34.6 g/dL (ref 32.0–36.0)
MCV: 100.7 fL — ABNORMAL HIGH (ref 79.3–98.0)
MONO#: 0.6 10*3/uL (ref 0.1–0.9)
MONO%: 8.6 % (ref 0.0–14.0)
NEUT#: 4.7 10*3/uL (ref 1.5–6.5)
NEUT%: 64.3 % (ref 39.0–75.0)
Platelets: 166 10*3/uL (ref 140–400)
RBC: 4.34 10*6/uL (ref 4.20–5.82)
RDW: 18 % — AB (ref 11.0–14.6)
WBC: 7.3 10*3/uL (ref 4.0–10.3)
lymph#: 1.7 10*3/uL (ref 0.9–3.3)

## 2015-01-09 LAB — COMPREHENSIVE METABOLIC PANEL (CC13)
ALK PHOS: 95 U/L (ref 40–150)
ALT: 16 U/L (ref 0–55)
AST: 17 U/L (ref 5–34)
Albumin: 3.8 g/dL (ref 3.5–5.0)
Anion Gap: 10 mEq/L (ref 3–11)
BILIRUBIN TOTAL: 0.97 mg/dL (ref 0.20–1.20)
BUN: 14 mg/dL (ref 7.0–26.0)
CO2: 20 mEq/L — ABNORMAL LOW (ref 22–29)
CREATININE: 0.8 mg/dL (ref 0.7–1.3)
Calcium: 8.9 mg/dL (ref 8.4–10.4)
Chloride: 109 mEq/L (ref 98–109)
EGFR: >90 mL/min/{1.73_m2} (ref >90)
Glucose: 115 mg/dl (ref 70–140)
Potassium: 4 mEq/L (ref 3.5–5.1)
SODIUM: 139 meq/L (ref 136–145)
TOTAL PROTEIN: 6.8 g/dL (ref 6.4–8.3)

## 2015-01-09 MED ORDER — HEPARIN SOD (PORK) LOCK FLUSH 100 UNIT/ML IV SOLN
500.0000 [IU] | Freq: Once | INTRAVENOUS | Status: AC
  Administered 2015-01-09: 500 [IU] via INTRAVENOUS
  Filled 2015-01-09: qty 5

## 2015-01-09 MED ORDER — SODIUM CHLORIDE 0.9 % IJ SOLN
10.0000 mL | INTRAMUSCULAR | Status: DC | PRN
  Administered 2015-01-09: 10 mL via INTRAVENOUS
  Filled 2015-01-09: qty 10

## 2015-01-09 NOTE — Telephone Encounter (Signed)
[facility] faxed confirmation of facsimile receipt for Capeciabine prescription referral.  [facility] will verify insurance and make delivery arrangements with patient.

## 2015-01-09 NOTE — Telephone Encounter (Signed)
Gave patient avs report and appointments for June.  °

## 2015-01-09 NOTE — Patient Instructions (Signed)

## 2015-01-09 NOTE — [facility] Navigator Note (Signed)
Met with patient during office visit to provide support and assess for needs to promote continuity of care 1. Provided with information about [facility] monthly 2. Survivor celebration in June 3. Discussed his concerns for his mother, with lung cancer and her treatment. Has still been able to carry out normal ADL's including work. No barriers to care noted.  Merceda Elks, RN, BSN [facility] [place]

## 2015-01-09 NOTE — Progress Notes (Signed)
[facility] OFFICE PROGRESS NOTE   Diagnosis: Colon cancer  INTERVAL HISTORY:   He returns as scheduled. He continues every other week Xeloda. He completed the most recent treatment yesterday. Stable skin changes at the hands. The neuropathy symptoms have improved at the hands and/or stable at the feet. He noted the onset of neck discomfort and stiffness beginning 01/07/2015. No trauma. The discomfort as at the lower occiput and upper neck.  Good appetite. He reports an intentional weight loss.  Objective:  Vital signs in last 24 hours:  Blood pressure 146/86, pulse 67, temperature 97.8 F (36.6 C), temperature source Oral, resp. rate 18, height 6' (1.829 m), weight 234 lb 4.8 oz (106.278 kg), SpO2 98 %.    HEENT: Neck without mass. Minimal tenderness at the upper neck/occiput. No mass. Good range of motion. Resp: Lungs clear bilaterally Cardio: Regular rate and rhythm GI: No hepatomegaly, right lower quadrant urostomy, left lower quadrant colostomy Vascular: Trace low leg/ankle edema bilaterally  Skin: Skin thickening and superficial desquamation over the hands. Soles without erythema or skin breakdown. Minimal callous formation at the soles   Portacath/PICC-without erythema  Lab Results:  Lab Results  Component Value Date   WBC 7.3 01/09/2015   HGB 15.1 01/09/2015   HCT 43.6 01/09/2015   MCV 100.7* 01/09/2015   PLT 166 01/09/2015   NEUTROABS 4.7 01/09/2015     Medications: I have reviewed the patient's current medications.  Assessment/Plan: 1. Clinical stage IV (G6Y,I9S,W5) adenocarcinoma of the rectum with tumor directly extending to the bladder/prostate and CT scan evidence of metastatic chest adenopathy  Positive K-ras mutation.   Normal mismatch repair protein expression. Microsatellite stable   Status post low anterior resection and end colostomy with a cystoprostatectomy and colon conduit urinary diversion 11/01/2013.   Staging PET scan  with hypermetabolic pelvic nodes, mediastinal nodes, left adrenal metastasis, and left upper lobe nodule.   Initiation of FOLFOX 12/25/2013.   Restaging CT 03/16/2014 after 6 cycles of FOLFOX confirmed improvement in chest/pelvic lymphadenopathy, a left upper lobe nodule, and resolution of a left adrenal nodule   Oxaliplatin deleted from cycle 8 FOLFOX 04/02/2014 secondary to neuropathy and thrombocytopenia.   Oxaliplatin held with cycle 9 FOLFOX 04/16/2014 due to neuropathy.   Cycle 10 FOLFOX 04/30/2014.   Cycle 11 FOLFOX 05/15/2014.   Cycle 12 FOLFOX 05/28/2014. Oxaliplatin held due to increased neuropathy.   Restaging CT evaluation 06/08/2014 with stable mediastinal and hilar adenopathy. Stable borderline left iliac lymph node. No new or progressive disease identified within the chest, abdomen or pelvis.   "Maintenance" 5-fluorouracil beginning 06/11/2014.  Restaging CT evaluation 10/10/2014 showed similar mild mediastinal and right hilar adenopathy. No visible recurrence of the prior left upper lobe metastatic lesion or the left adrenal metastatic lesion. No new lesions noted.  Maintenance Xeloda on a 7 day on/7 day off schedule beginning 10/25/2014 2. Microcytic anemia-likely iron deficiency, improved.  3. Gout. 4. Port-A-Cath placement 12/18/2013.  5. Anxiety. He takes Xanax as needed. 6. History of left knee pain and swelling. 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 Trial of Cymbalta initiated 07/09/2014. 8. History of Thrombocytopenia secondary to chemotherapy. 9. Skin rash 04/30/2014-resolved with doxycycline 10. Chronic edema left lower leg/ankle. Negative Doppler 09/17/2014    Disposition:  He appears to be tolerating the Xeloda well. There is no clinical evidence for progression of the colon cancer. I suspect the neck discomfort is related to a benign musculoskeletal  condition. He will  seek medical attention if the neck does not improve over the next few days.  Mr. Arrants will return for an office visit and Port-A-Cath flush in one month.  [name], MD  01/09/2015  8:31 AM

## 2015-01-10 ENCOUNTER — Other Ambulatory Visit: Payer: Self-pay | Admitting: *Deleted

## 2015-01-10 DIAGNOSIS — C20 Malignant neoplasm of rectum: Secondary | ICD-10-CM

## 2015-01-10 MED ORDER — CAPECITABINE 500 MG PO TABS: ORAL_TABLET | ORAL | Status: DC

## 2015-01-16 ENCOUNTER — Other Ambulatory Visit: Payer: Self-pay | Admitting: Oncology

## 2015-02-05 ENCOUNTER — Telehealth: Payer: Self-pay | Admitting: *Deleted

## 2015-02-05 NOTE — Telephone Encounter (Signed)
[facility] faxed Xeloda refill request.  Request to provider's desk/in-basket for review.

## 2015-02-06 ENCOUNTER — Other Ambulatory Visit (HOSPITAL_BASED_OUTPATIENT_CLINIC_OR_DEPARTMENT_OTHER): Payer: 59

## 2015-02-06 ENCOUNTER — Other Ambulatory Visit: Payer: Self-pay | Admitting: *Deleted

## 2015-02-06 ENCOUNTER — Ambulatory Visit (HOSPITAL_BASED_OUTPATIENT_CLINIC_OR_DEPARTMENT_OTHER): Payer: 59 | Admitting: Nurse Practitioner

## 2015-02-06 ENCOUNTER — Telehealth: Payer: Self-pay | Admitting: Oncology

## 2015-02-06 ENCOUNTER — Ambulatory Visit (HOSPITAL_BASED_OUTPATIENT_CLINIC_OR_DEPARTMENT_OTHER): Payer: 59

## 2015-02-06 VITALS — BP 154/91 | HR 86 | Temp 98.1°F | Resp 18 | Ht 72.0 in | Wt 236.9 lb

## 2015-02-06 DIAGNOSIS — L271 Localized skin eruption due to drugs and medicaments taken internally: Secondary | ICD-10-CM

## 2015-02-06 DIAGNOSIS — Z95828 Presence of other vascular implants and grafts: Secondary | ICD-10-CM

## 2015-02-06 DIAGNOSIS — M545 Low back pain: Secondary | ICD-10-CM

## 2015-02-06 DIAGNOSIS — G620 Drug-induced polyneuropathy: Secondary | ICD-10-CM | POA: Diagnosis not present

## 2015-02-06 DIAGNOSIS — C20 Malignant neoplasm of rectum: Secondary | ICD-10-CM

## 2015-02-06 DIAGNOSIS — D509 Iron deficiency anemia, unspecified: Secondary | ICD-10-CM

## 2015-02-06 DIAGNOSIS — R600 Localized edema: Secondary | ICD-10-CM

## 2015-02-06 DIAGNOSIS — C7972 Secondary malignant neoplasm of left adrenal gland: Secondary | ICD-10-CM | POA: Diagnosis not present

## 2015-02-06 DIAGNOSIS — Z23 Encounter for immunization: Secondary | ICD-10-CM

## 2015-02-06 DIAGNOSIS — F419 Anxiety disorder, unspecified: Secondary | ICD-10-CM

## 2015-02-06 LAB — COMPREHENSIVE METABOLIC PANEL (CC13)
ALBUMIN: 3.6 g/dL (ref 3.5–5.0)
ALK PHOS: 86 U/L (ref 40–150)
ALT: 19 U/L (ref 0–55)
AST: 21 U/L (ref 5–34)
Anion Gap: 12 mEq/L — ABNORMAL HIGH (ref 3–11)
BUN: 11.4 mg/dL (ref 7.0–26.0)
CHLORIDE: 110 meq/L — AB (ref 98–109)
CO2: 18 mEq/L — ABNORMAL LOW (ref 22–29)
Calcium: 8.3 mg/dL — ABNORMAL LOW (ref 8.4–10.4)
Creatinine: 0.8 mg/dL (ref 0.7–1.3)
EGFR: >90 mL/min/{1.73_m2} (ref >90)
GLUCOSE: 102 mg/dL (ref 70–140)
Potassium: 4 mEq/L (ref 3.5–5.1)
Sodium: 140 mEq/L (ref 136–145)
Total Bilirubin: 0.56 mg/dL (ref 0.20–1.20)
Total Protein: 6.8 g/dL (ref 6.4–8.3)

## 2015-02-06 LAB — CBC WITH DIFFERENTIAL/PLATELET
BASO%: 1 % (ref 0.0–2.0)
BASOS ABS: 0.1 10*3/uL (ref 0.0–0.1)
EOS ABS: 0.2 10*3/uL (ref 0.0–0.5)
EOS%: 2.8 % (ref 0.0–7.0)
HCT: 43.4 % (ref 38.4–49.9)
HGB: 15.1 g/dL (ref 13.0–17.1)
LYMPH%: 29.4 % (ref 14.0–49.0)
MCH: 35 pg — ABNORMAL HIGH (ref 27.2–33.4)
MCHC: 34.8 g/dL (ref 32.0–36.0)
MCV: 100.4 fL — ABNORMAL HIGH (ref 79.3–98.0)
MONO#: 0.5 10*3/uL (ref 0.1–0.9)
MONO%: 8 % (ref 0.0–14.0)
NEUT#: 4 10*3/uL (ref 1.5–6.5)
NEUT%: 58.8 % (ref 39.0–75.0)
Platelets: 163 10*3/uL (ref 140–400)
RBC: 4.32 10*6/uL (ref 4.20–5.82)
RDW: 17.1 % — AB (ref 11.0–14.6)
WBC: 6.7 10*3/uL (ref 4.0–10.3)
lymph#: 2 10*3/uL (ref 0.9–3.3)

## 2015-02-06 MED ORDER — OXYCODONE-ACETAMINOPHEN 5-325 MG PO TABS: 1.0000 | ORAL_TABLET | Freq: Four times a day (QID) | ORAL | Status: DC | PRN

## 2015-02-06 MED ORDER — SODIUM CHLORIDE 0.9 % IJ SOLN
10.0000 mL | INTRAMUSCULAR | Status: DC | PRN
  Administered 2015-02-06: 10 mL via INTRAVENOUS
  Filled 2015-02-06: qty 10

## 2015-02-06 MED ORDER — HEPARIN SOD (PORK) LOCK FLUSH 100 UNIT/ML IV SOLN
500.0000 [IU] | Freq: Once | INTRAVENOUS | Status: AC
  Administered 2015-02-06: 500 [IU] via INTRAVENOUS
  Filled 2015-02-06: qty 5

## 2015-02-06 MED ORDER — ALPRAZOLAM 1 MG PO TABS: ORAL_TABLET | ORAL | Status: DC

## 2015-02-06 MED ORDER — INDOMETHACIN 50 MG PO CAPS: 50.0000 mg | ORAL_CAPSULE | Freq: Three times a day (TID) | ORAL | Status: DC

## 2015-02-06 MED ORDER — CAPECITABINE 500 MG PO TABS: ORAL_TABLET | ORAL | Status: DC

## 2015-02-06 NOTE — Patient Instructions (Signed)

## 2015-02-06 NOTE — Progress Notes (Signed)
[place] OFFICE PROGRESS NOTE   Diagnosis:  Colon cancer  INTERVAL HISTORY:   Austin Robbins returns as scheduled. He continues Xeloda 7 days on/7 days off. He completed the most recent cycle yesterday. He denies nausea/vomiting. No mouth sores. He has had recent watery output from the colostomy. 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 He reports good oral intake. He notes increased peeling on his hands. He developed a "blood blister" between 2 toes recently. The blister ruptured and has healed. He continues to have numbness in the toes and feet. Over the past week he has noted intermittent low back pain. He is taking oxycodone as needed. He takes no more than 1 oxycodone tablet a day and some days does not require any. He feels the pain is some better this week. No leg weakness. The neck pain he reported at the time of his last visit has resolved.  Objective:  Vital signs in last 24 hours:  Blood pressure 154/91, pulse 86, temperature 98.1 F (36.7 C), temperature source Oral, resp. rate 18, height 6' (1.829 m), weight 236 lb 14.4 oz (107.457 kg), SpO2 100 %.    HEENT: No thrush or ulcers. Resp: Lungs clear bilaterally. Cardio: Regular rate and rhythm. GI: Abdomen soft and nontender. No hepatomegaly. Right lower quadrant urostomy. Left lower quadrant colostomy. Vascular: Trace lower leg edema bilaterally.  Skin: Palms with mild erythema, skin thickening and superficial desquamation. Soles with mild erythema. No skin breakdown.  Neuro: Leg strength is intact.   Lab Results:  Lab Results  Component Value Date   WBC 6.7 02/06/2015   HGB 15.1 02/06/2015   HCT 43.4 02/06/2015   MCV 100.4* 02/06/2015   PLT 163 02/06/2015   NEUTROABS 4.0 02/06/2015    Imaging:  No results found.  Medications: I have reviewed the patient's current medications.  Assessment/Plan: 1. Clinical stage IV (B2I,O0B,T5) adenocarcinoma of the rectum with tumor directly  extending to the bladder/prostate and CT scan evidence of metastatic chest adenopathy  Positive K-ras mutation.   Normal mismatch repair protein expression. Microsatellite stable   Status post low anterior resection and end colostomy with a cystoprostatectomy and colon conduit urinary diversion 11/01/2013.   Staging PET scan with hypermetabolic pelvic nodes, mediastinal nodes, left adrenal metastasis, and left upper lobe nodule.   Initiation of FOLFOX 12/25/2013.   Restaging CT 03/16/2014 after 6 cycles of FOLFOX confirmed improvement in chest/pelvic lymphadenopathy, a left upper lobe nodule, and resolution of a left adrenal nodule   Oxaliplatin deleted from cycle 8 FOLFOX 04/02/2014 secondary to neuropathy and thrombocytopenia.   Oxaliplatin held with cycle 9 FOLFOX 04/16/2014 due to neuropathy.   Cycle 10 FOLFOX 04/30/2014.   Cycle 11 FOLFOX 05/15/2014.   Cycle 12 FOLFOX 05/28/2014. Oxaliplatin held due to increased neuropathy.   Restaging CT evaluation 06/08/2014 with stable mediastinal and hilar adenopathy. Stable borderline left iliac lymph node. No new or progressive disease identified within the chest, abdomen or pelvis.   "Maintenance" 5-fluorouracil beginning 06/11/2014.  Restaging CT evaluation 10/10/2014 showed similar mild mediastinal and right hilar adenopathy. No visible recurrence of the prior left upper lobe metastatic lesion or the left adrenal metastatic lesion. No new lesions noted.  Maintenance Xeloda on a 7 day on/7 day off schedule beginning 10/25/2014 2. Microcytic anemia-likely iron deficiency, improved.  3. Gout. 4. Port-A-Cath placement 12/18/2013.  5. Anxiety. He takes Xanax as needed. 6. History of left knee pain and swelling. He was treated with a Medrol Dosepak  03/05/2014 7. 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 Trial of Cymbalta initiated 07/09/2014. 8. History of Thrombocytopenia secondary to chemotherapy. 9. Skin  rash 04/30/2014-resolved with doxycycline 10. Chronic edema left lower leg/ankle. Negative Doppler 09/17/2014   Disposition: [name] appears stable. He will continue Xeloda 7 days on/7 days off. We are referring him for a restaging CT evaluation in the next several weeks.  He is having intermittent low back pain. He feels the pain is better this week. He was issued a new prescription for oxycodone. He understands to contact the office if the back pain worsens or he develops new symptoms such as leg weakness or numbness.  He has mild hand-foot syndrome. He will contact the office if current symptoms worsen.  He will return for a follow-up visit in 4 weeks. Restaging CT evaluation as above. He will contact the office in the interim as outlined above or with any other problems.  Plan reviewed with Dr. Benay Spice.    [name] ANP/GNP-BC   02/06/2015  10:07 AM

## 2015-02-06 NOTE — Telephone Encounter (Signed)
Gave and printed appt sched and avs fo rpt for JuNe...gv barium

## 2015-02-08 NOTE — Telephone Encounter (Signed)
Received fax confirmation from [facility] pt's Xeloda #98 tabs was shipped on 02/07/15.

## 2015-03-04 ENCOUNTER — Encounter (HOSPITAL_COMMUNITY): Payer: Self-pay

## 2015-03-04 ENCOUNTER — Ambulatory Visit: Payer: 59

## 2015-03-04 ENCOUNTER — Ambulatory Visit (HOSPITAL_COMMUNITY)
Admission: RE | Admit: 2015-03-04 | Discharge: 2015-03-04 | Disposition: A | Discharge status: Home / Self Care | Payer: 59 | Source: Ambulatory Visit | Attending: Nurse Practitioner | Admitting: Nurse Practitioner

## 2015-03-04 ENCOUNTER — Other Ambulatory Visit (HOSPITAL_BASED_OUTPATIENT_CLINIC_OR_DEPARTMENT_OTHER): Payer: 59

## 2015-03-04 DIAGNOSIS — Z79899 Other long term (current) drug therapy: Secondary | ICD-10-CM | POA: Insufficient documentation

## 2015-03-04 DIAGNOSIS — C20 Malignant neoplasm of rectum: Secondary | ICD-10-CM

## 2015-03-04 DIAGNOSIS — Z08 Encounter for follow-up examination after completed treatment for malignant neoplasm: Secondary | ICD-10-CM | POA: Diagnosis present

## 2015-03-04 DIAGNOSIS — C7911 Secondary malignant neoplasm of bladder: Secondary | ICD-10-CM | POA: Diagnosis not present

## 2015-03-04 DIAGNOSIS — C7972 Secondary malignant neoplasm of left adrenal gland: Secondary | ICD-10-CM | POA: Diagnosis not present

## 2015-03-04 DIAGNOSIS — C771 Secondary and unspecified malignant neoplasm of intrathoracic lymph nodes: Secondary | ICD-10-CM | POA: Diagnosis not present

## 2015-03-04 DIAGNOSIS — C774 Secondary and unspecified malignant neoplasm of inguinal and lower limb lymph nodes: Secondary | ICD-10-CM | POA: Insufficient documentation

## 2015-03-04 DIAGNOSIS — Z95828 Presence of other vascular implants and grafts: Secondary | ICD-10-CM

## 2015-03-04 LAB — COMPREHENSIVE METABOLIC PANEL (CC13)
ALBUMIN: 3.7 g/dL (ref 3.5–5.0)
ALT: 20 U/L (ref 0–55)
AST: 18 U/L (ref 5–34)
Alkaline Phosphatase: 80 U/L (ref 40–150)
Anion Gap: 11 mEq/L (ref 3–11)
BUN: 12.9 mg/dL (ref 7.0–26.0)
CHLORIDE: 108 meq/L (ref 98–109)
CO2: 21 mEq/L — ABNORMAL LOW (ref 22–29)
Calcium: 8.6 mg/dL (ref 8.4–10.4)
Creatinine: 0.8 mg/dL (ref 0.7–1.3)
Glucose: 96 mg/dl (ref 70–140)
Potassium: 4.3 mEq/L (ref 3.5–5.1)
SODIUM: 139 meq/L (ref 136–145)
TOTAL PROTEIN: 6.6 g/dL (ref 6.4–8.3)
Total Bilirubin: 0.63 mg/dL (ref 0.20–1.20)

## 2015-03-04 LAB — CBC WITH DIFFERENTIAL/PLATELET
BASO%: 0.2 % (ref 0.0–2.0)
Basophils Absolute: 0 10*3/uL (ref 0.0–0.1)
EOS%: 4.2 % (ref 0.0–7.0)
Eosinophils Absolute: 0.3 10*3/uL (ref 0.0–0.5)
HCT: 42.3 % (ref 38.4–49.9)
HGB: 15 g/dL (ref 13.0–17.1)
LYMPH%: 29.5 % (ref 14.0–49.0)
MCH: 35.5 pg — ABNORMAL HIGH (ref 27.2–33.4)
MCHC: 35.5 g/dL (ref 32.0–36.0)
MCV: 100.2 fL — AB (ref 79.3–98.0)
MONO#: 0.6 10*3/uL (ref 0.1–0.9)
MONO%: 9.5 % (ref 0.0–14.0)
NEUT#: 3.6 10*3/uL (ref 1.5–6.5)
NEUT%: 56.6 % (ref 39.0–75.0)
Platelets: 133 10*3/uL — ABNORMAL LOW (ref 140–400)
RBC: 4.22 10*6/uL (ref 4.20–5.82)
RDW: 15.6 % — ABNORMAL HIGH (ref 11.0–14.6)
WBC: 6.4 10*3/uL (ref 4.0–10.3)
lymph#: 1.9 10*3/uL (ref 0.9–3.3)

## 2015-03-04 MED ORDER — IOHEXOL 300 MG/ML  SOLN
100.0000 mL | Freq: Once | INTRAMUSCULAR | Status: AC | PRN
  Administered 2015-03-04: 100 mL via INTRAVENOUS

## 2015-03-04 MED ORDER — SODIUM CHLORIDE 0.9 % IJ SOLN
10.0000 mL | INTRAMUSCULAR | Status: DC | PRN
  Administered 2015-03-04: 10 mL via INTRAVENOUS
  Filled 2015-03-04: qty 10

## 2015-03-04 MED ORDER — HEPARIN SOD (PORK) LOCK FLUSH 100 UNIT/ML IV SOLN
500.0000 [IU] | Freq: Once | INTRAVENOUS | Status: AC
  Administered 2015-03-04: 500 [IU] via INTRAVENOUS
  Filled 2015-03-04: qty 5

## 2015-03-04 NOTE — Patient Instructions (Signed)

## 2015-03-04 NOTE — Progress Notes (Signed)
Pt was informed if  Port not  deaccessed  by CT Scan department, he should return to flush room before going home. Pt  verbalized understanding.

## 2015-03-06 ENCOUNTER — Telehealth: Payer: Self-pay | Admitting: *Deleted

## 2015-03-06 ENCOUNTER — Other Ambulatory Visit: Payer: Self-pay | Admitting: *Deleted

## 2015-03-06 ENCOUNTER — Telehealth: Payer: Self-pay | Admitting: Oncology

## 2015-03-06 ENCOUNTER — Ambulatory Visit (HOSPITAL_BASED_OUTPATIENT_CLINIC_OR_DEPARTMENT_OTHER): Payer: 59 | Admitting: Oncology

## 2015-03-06 VITALS — BP 145/79 | HR 63 | Temp 98.2°F | Resp 16 | Ht 72.0 in | Wt 237.4 lb

## 2015-03-06 DIAGNOSIS — C20 Malignant neoplasm of rectum: Secondary | ICD-10-CM

## 2015-03-06 MED ORDER — CAPECITABINE 500 MG PO TABS: ORAL_TABLET | ORAL | Status: DC

## 2015-03-06 MED ORDER — ALPRAZOLAM 1 MG PO TABS: ORAL_TABLET | ORAL | Status: DC

## 2015-03-06 NOTE — Telephone Encounter (Signed)
TC from [facility] to clarify Alprazolam prescription-(1) tablet every 8 hours as needed. Spoke with [employer].

## 2015-03-06 NOTE — Progress Notes (Signed)
[facility] OFFICE PROGRESS NOTE   Diagnosis: Rectal cancer  INTERVAL HISTORY:   Austin Robbins returns as scheduled. He continues every other week Xeloda. He completed the most recent treatment yesterday. He continues to have erythema and cracking of the skin at the hands and feet. He is working. Persistent neuropathy symptoms.  Objective:  Vital signs in last 24 hours:  Blood pressure 145/79, pulse 63, temperature 98.2 F (36.8 C), temperature source Oral, resp. rate 16, height 6' (1.829 m), weight 237 lb 6.4 oz (107.684 kg), SpO2 99 %.    HEENT: No thrush or ulcers Resp: Clear bilaterally Cardio: Regular rate and rhythm GI: No hepatomegaly, nontender, left lower quadrant colostomy, right lower quadrant urostomy Vascular: No leg edema  Skin: Mild erythema with superficial cracking at the fingers. Soles with mild erythema and callus formation.   Portacath/PICC-without erythema  Lab Results:  Lab Results  Component Value Date   WBC 6.4 03/04/2015   HGB 15.0 03/04/2015   HCT 42.3 03/04/2015   MCV 100.2* 03/04/2015   PLT 133* 03/04/2015   NEUTROABS 3.6 03/04/2015      Imaging:  Ct Chest W Contrast  03/04/2015   CLINICAL DATA:  Metastatic colon cancer to the bladder and lymph nodes diagnosed 4 months ago. Chemotherapy in progress. Previous bowel resection, urostomy and colostomy. Subsequent encounter  EXAM: CT CHEST, ABDOMEN, AND PELVIS WITH CONTRAST  TECHNIQUE: Multidetector CT imaging of the chest, abdomen and pelvis was performed following the standard protocol during bolus administration of intravenous contrast.  CONTRAST:  19m OMNIPAQUE IOHEXOL 300 MG/ML  SOLN  COMPARISON:  CTs 10/10/2014 and 06/08/2014.  FINDINGS: CT CHEST FINDINGS  Mediastinum/Nodes: Prominent mediastinal lymph nodes are similar to the prior examination, largest measuring 11 mm in the left paratracheal region (image 26) and 12 mm in the subcarinal station (image 37). No progressive  adenopathy identified. The thyroid gland and trachea demonstrate no significant findings. There is a stable small hiatal hernia. The heart size is normal. There is no pericardial effusion.There is stable mild atherosclerosis of the aorta and coronary arteries. Left subclavian Port-A-Cath tip is in the mid SVC. There is a small amount of nonocclusive thrombus/fibrin sheath formation along the catheter, best seen on coronal image number 53.  Lungs/Pleura: There is no pleural effusion.The lungs remain clear without suspicious pulmonary nodules.  Musculoskeletal/Chest wall: No chest wall mass or suspicious osseous findings.  CT ABDOMEN AND PELVIS FINDINGS  Hepatobiliary: Mild hepatic steatosis suspected. No focal hepatic lesions No evidence of gallstones, gallbladder wall thickening or biliary dilatation.  Pancreas: Unremarkable. No pancreatic ductal dilatation or surrounding inflammatory changes.  Spleen: Normal in size without focal abnormality.  Adrenals/Urinary Tract: Both adrenal glands appear normal.Both kidneys appear normal without evidence of cortical lesion or hydronephrosis. The urinary diversion and right-sided ostomy appear unchanged.  Stomach/Bowel: No evidence of bowel wall thickening, distention or surrounding inflammatory change.Previous sigmoid colostomy and abdominal perineal resection noted. The appendix appears normal.  Vascular/Lymphatic: There are no enlarged abdominal or pelvic lymph nodes. Prominent inguinal lymph nodes bilaterally are stable with fatty hila. Stable mild atherosclerosis of the aorta, its branches and the iliac arteries.  Reproductive: Status post prostatectomy. No evidence of recurrent pelvic mass.  Other: Postsurgical changes within the anterior abdominal wall without evidence of parastomal hernia.  Musculoskeletal: No acute or significant osseous findings. Stable mild lumbar spondylosis.  IMPRESSION: 1. No evidence of metastatic disease within the chest, abdomen or pelvis. 2.  Previously noted prominent mediastinal and inguinal lymph  nodes are stable to slightly improved. 3. Nonocclusive thrombus/fibrin sheath associated with the left subclavian Port-A-Cath. 4. Atherosclerosis as before.   Electronically Signed   By: Richardean Sale M.D.   On: 03/04/2015 10:26   Ct Abdomen Pelvis W Contrast  03/04/2015   CLINICAL DATA:  Metastatic colon cancer to the bladder and lymph nodes diagnosed 4 months ago. Chemotherapy in progress. Previous bowel resection, urostomy and colostomy. Subsequent encounter  EXAM: CT CHEST, ABDOMEN, AND PELVIS WITH CONTRAST  TECHNIQUE: Multidetector CT imaging of the chest, abdomen and pelvis was performed following the standard protocol during bolus administration of intravenous contrast.  CONTRAST:  125m OMNIPAQUE IOHEXOL 300 MG/ML  SOLN  COMPARISON:  CTs 10/10/2014 and 06/08/2014.  FINDINGS: CT CHEST FINDINGS  Mediastinum/Nodes: Prominent mediastinal lymph nodes are similar to the prior examination, largest measuring 11 mm in the left paratracheal region (image 26) and 12 mm in the subcarinal station (image 37). No progressive adenopathy identified. The thyroid gland and trachea demonstrate no significant findings. There is a stable small hiatal hernia. The heart size is normal. There is no pericardial effusion.There is stable mild atherosclerosis of the aorta and coronary arteries. Left subclavian Port-A-Cath tip is in the mid SVC. There is a small amount of nonocclusive thrombus/fibrin sheath formation along the catheter, best seen on coronal image number 53.  Lungs/Pleura: There is no pleural effusion.The lungs remain clear without suspicious pulmonary nodules.  Musculoskeletal/Chest wall: No chest wall mass or suspicious osseous findings.  CT ABDOMEN AND PELVIS FINDINGS  Hepatobiliary: Mild hepatic steatosis suspected. No focal hepatic lesions No evidence of gallstones, gallbladder wall thickening or biliary dilatation.  Pancreas: Unremarkable. No pancreatic  ductal dilatation or surrounding inflammatory changes.  Spleen: Normal in size without focal abnormality.  Adrenals/Urinary Tract: Both adrenal glands appear normal.Both kidneys appear normal without evidence of cortical lesion or hydronephrosis. The urinary diversion and right-sided ostomy appear unchanged.  Stomach/Bowel: No evidence of bowel wall thickening, distention or surrounding inflammatory change.Previous sigmoid colostomy and abdominal perineal resection noted. The appendix appears normal.  Vascular/Lymphatic: There are no enlarged abdominal or pelvic lymph nodes. Prominent inguinal lymph nodes bilaterally are stable with fatty hila. Stable mild atherosclerosis of the aorta, its branches and the iliac arteries.  Reproductive: Status post prostatectomy. No evidence of recurrent pelvic mass.  Other: Postsurgical changes within the anterior abdominal wall without evidence of parastomal hernia.  Musculoskeletal: No acute or significant osseous findings. Stable mild lumbar spondylosis.  IMPRESSION: 1. No evidence of metastatic disease within the chest, abdomen or pelvis. 2. Previously noted prominent mediastinal and inguinal lymph nodes are stable to slightly improved. 3. Nonocclusive thrombus/fibrin sheath associated with the left subclavian Port-A-Cath. 4. Atherosclerosis as before.   Electronically Signed   By: WRichardean SaleM.D.   On: 03/04/2015 10:26    Medications: I have reviewed the patient's current medications.  Assessment/Plan: 1. Clinical stage IV ((Y8M,V7Q,I6 adenocarcinoma of the rectum with tumor directly extending to the bladder/prostate and CT scan evidence of metastatic chest adenopathy  Positive K-ras mutation.   Normal mismatch repair protein expression. Microsatellite stable   Status post low anterior resection and end colostomy with a cystoprostatectomy and colon conduit urinary diversion 11/01/2013.   Staging PET scan with hypermetabolic pelvic nodes, mediastinal  nodes, left adrenal metastasis, and left upper lobe nodule.   Initiation of FOLFOX 12/25/2013.   Restaging CT 03/16/2014 after 6 cycles of FOLFOX confirmed improvement in chest/pelvic lymphadenopathy, a left upper lobe nodule, and resolution of a left adrenal nodule  Oxaliplatin deleted from cycle 8 FOLFOX 04/02/2014 secondary to neuropathy and thrombocytopenia.   Oxaliplatin held with cycle 9 FOLFOX 04/16/2014 due to neuropathy.   Cycle 10 FOLFOX 04/30/2014.   Cycle 11 FOLFOX 05/15/2014.   Cycle 12 FOLFOX 05/28/2014. Oxaliplatin held due to increased neuropathy.   Restaging CT evaluation 06/08/2014 with stable mediastinal and hilar adenopathy. Stable borderline left iliac lymph node. No new or progressive disease identified within the chest, abdomen or pelvis.   "Maintenance" 5-fluorouracil beginning 06/11/2014.  Restaging CT evaluation 10/10/2014 showed similar mild mediastinal and right hilar adenopathy. No visible recurrence of the prior left upper lobe metastatic lesion or the left adrenal metastatic lesion. No new lesions noted.  Maintenance Xeloda on a 7 day on/7 day off schedule beginning 10/25/2014  Restaging CTs 03/04/2015 with stable mediastinal lymph nodes and no evidence of metastatic disease 2. Microcytic anemia-likely iron deficiency, improved.  3. Gout. 4. Port-A-Cath placement 12/18/2013.  5. Anxiety. He takes Xanax as needed. 6. History of left knee pain and swelling. 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 Trial of Cymbalta initiated 07/09/2014. 8. History of Thrombocytopenia secondary to chemotherapy. 9. Skin rash 04/30/2014-resolved with doxycycline 10. Chronic edema left lower leg/ankle. Negative Doppler 09/17/2014 11. Hand-foot syndrome secondary to Xeloda   Disposition:  Mr. Kashuba appears stable. He has mild hand-foot syndrome secondary to Xeloda. The restaging CT reveals  no evidence of metastatic rectal cancer. We discussed continuing Xeloda versus a treatment break. He will continue Xeloda as long as the hand/foot symptoms do not progress.  Mr. Lupercio will return for an office visit and Port-A-Cath flush on 04/10/2015.  [name], MD  03/06/2015  11:39 AM

## 2015-03-06 NOTE — Telephone Encounter (Signed)
Pt confirmed labs/ov per 06/29 POF, gave pt AVS and Calendar... KJ °

## 2015-04-02 ENCOUNTER — Other Ambulatory Visit: Payer: Self-pay | Admitting: *Deleted

## 2015-04-02 DIAGNOSIS — C20 Malignant neoplasm of rectum: Secondary | ICD-10-CM

## 2015-04-02 MED ORDER — CAPECITABINE 500 MG PO TABS: ORAL_TABLET | ORAL | Status: DC

## 2015-04-10 ENCOUNTER — Telehealth: Payer: Self-pay | Admitting: Nurse Practitioner

## 2015-04-10 ENCOUNTER — Ambulatory Visit (HOSPITAL_BASED_OUTPATIENT_CLINIC_OR_DEPARTMENT_OTHER): Payer: 59 | Admitting: Nurse Practitioner

## 2015-04-10 ENCOUNTER — Other Ambulatory Visit (HOSPITAL_BASED_OUTPATIENT_CLINIC_OR_DEPARTMENT_OTHER): Payer: 59

## 2015-04-10 ENCOUNTER — Ambulatory Visit: Payer: 59

## 2015-04-10 ENCOUNTER — Other Ambulatory Visit: Payer: Self-pay | Admitting: Oncology

## 2015-04-10 VITALS — BP 152/85 | HR 56 | Temp 98.5°F | Resp 18 | Ht 72.0 in | Wt 235.8 lb

## 2015-04-10 DIAGNOSIS — C7802 Secondary malignant neoplasm of left lung: Secondary | ICD-10-CM

## 2015-04-10 DIAGNOSIS — L271 Localized skin eruption due to drugs and medicaments taken internally: Secondary | ICD-10-CM

## 2015-04-10 DIAGNOSIS — C7972 Secondary malignant neoplasm of left adrenal gland: Secondary | ICD-10-CM

## 2015-04-10 DIAGNOSIS — C20 Malignant neoplasm of rectum: Secondary | ICD-10-CM

## 2015-04-10 DIAGNOSIS — Z95828 Presence of other vascular implants and grafts: Secondary | ICD-10-CM

## 2015-04-10 LAB — COMPREHENSIVE METABOLIC PANEL (CC13)
ALK PHOS: 87 U/L (ref 40–150)
ALT: 25 U/L (ref 0–55)
AST: 25 U/L (ref 5–34)
Albumin: 3.7 g/dL (ref 3.5–5.0)
Anion Gap: 7 mEq/L (ref 3–11)
BUN: 11.3 mg/dL (ref 7.0–26.0)
CHLORIDE: 109 meq/L (ref 98–109)
CO2: 20 mEq/L — ABNORMAL LOW (ref 22–29)
Calcium: 8.4 mg/dL (ref 8.4–10.4)
Creatinine: 0.8 mg/dL (ref 0.7–1.3)
EGFR: >90 mL/min/{1.73_m2} (ref >90)
Glucose: 108 mg/dl (ref 70–140)
POTASSIUM: 4.3 meq/L (ref 3.5–5.1)
Sodium: 136 mEq/L (ref 136–145)
Total Bilirubin: 0.83 mg/dL (ref 0.20–1.20)
Total Protein: 6.7 g/dL (ref 6.4–8.3)

## 2015-04-10 LAB — CBC WITH DIFFERENTIAL/PLATELET
BASO%: 0.3 % (ref 0.0–2.0)
BASOS ABS: 0 10*3/uL (ref 0.0–0.1)
EOS ABS: 0.2 10*3/uL (ref 0.0–0.5)
EOS%: 2.8 % (ref 0.0–7.0)
HEMATOCRIT: 42.7 % (ref 38.4–49.9)
HGB: 15.4 g/dL (ref 13.0–17.1)
LYMPH#: 1.8 10*3/uL (ref 0.9–3.3)
LYMPH%: 27.9 % (ref 14.0–49.0)
MCH: 35.6 pg — AB (ref 27.2–33.4)
MCHC: 36.1 g/dL — ABNORMAL HIGH (ref 32.0–36.0)
MCV: 98.8 fL — AB (ref 79.3–98.0)
MONO#: 0.6 10*3/uL (ref 0.1–0.9)
MONO%: 8.8 % (ref 0.0–14.0)
NEUT#: 3.9 10*3/uL (ref 1.5–6.5)
NEUT%: 60.2 % (ref 39.0–75.0)
PLATELETS: 119 10*3/uL — AB (ref 140–400)
RBC: 4.32 10*6/uL (ref 4.20–5.82)
RDW: 15.8 % — AB (ref 11.0–14.6)
WBC: 6.4 10*3/uL (ref 4.0–10.3)

## 2015-04-10 MED ORDER — HEPARIN SOD (PORK) LOCK FLUSH 100 UNIT/ML IV SOLN
500.0000 [IU] | Freq: Once | INTRAVENOUS | Status: AC
  Administered 2015-04-10: 500 [IU] via INTRAVENOUS
  Filled 2015-04-10: qty 5

## 2015-04-10 MED ORDER — CAPECITABINE 500 MG PO TABS: ORAL_TABLET | ORAL | Status: DC

## 2015-04-10 MED ORDER — ALPRAZOLAM 1 MG PO TABS: 1.0000 mg | ORAL_TABLET | Freq: Three times a day (TID) | ORAL | Status: DC | PRN

## 2015-04-10 MED ORDER — SODIUM CHLORIDE 0.9 % IJ SOLN
10.0000 mL | INTRAMUSCULAR | Status: DC | PRN
  Administered 2015-04-10: 10 mL via INTRAVENOUS
  Filled 2015-04-10: qty 10

## 2015-04-10 NOTE — Progress Notes (Signed)
[place] OFFICE PROGRESS NOTE   Diagnosis:  Rectal cancer  INTERVAL HISTORY:   Austin Robbins returns as scheduled. He continues Xeloda 7 days on/7 days off. Hands and feet are painful at times. He developed some "blood blisters" on his feet. His feet feel swollen. He notes palms and soles are dry. No nausea or vomiting. No mouth sores. He has occasional watery stools. No abdominal pain. He has a good appetite. He has noted a mild increase in baseline dyspnea on exertion. He wonders if it may be related to the heat. No cough or fever. No chest pain. Objective:  Vital signs in last 24 hours:  Blood pressure 152/85, pulse 56, temperature 98.5 F (36.9 C), temperature source Oral, resp. rate 18, height 6' (1.829 m), weight 235 lb 12.8 oz (106.958 kg), SpO2 99 %.    HEENT: Posterior palate is erythematous. No ulcerations. Resp: Lungs clear bilaterally. Cardio: Regular rate and rhythm. GI: Abdomen soft and nontender. No hepatomegaly. No mass. Left lower quadrant colostomy. Right lower quadrant urostomy. Vascular: Trace lower leg edema bilaterally. Skin: Palms and soles with mild erythema, dry appearance. Soles with callus formation. No significant skin breakdown. Port-A-Cath without erythema.    Lab Results:  Lab Results  Component Value Date   WBC 6.4 04/10/2015   HGB 15.4 04/10/2015   HCT 42.7 04/10/2015   MCV 98.8* 04/10/2015   PLT 119* 04/10/2015   NEUTROABS 3.9 04/10/2015    Imaging:  No results found.  Medications: I have reviewed the patient's current medications.  Assessment/Plan: 1. Clinical stage IV (Z3G,U4Q,I3) adenocarcinoma of the rectum with tumor directly extending to the bladder/prostate and CT scan evidence of metastatic chest adenopathy  Positive K-ras mutation.   Normal mismatch repair protein expression. Microsatellite stable   Status post low anterior resection and end colostomy with a cystoprostatectomy and colon conduit urinary  diversion 11/01/2013.   Staging PET scan with hypermetabolic pelvic nodes, mediastinal nodes, left adrenal metastasis, and left upper lobe nodule.   Initiation of FOLFOX 12/25/2013.   Restaging CT 03/16/2014 after 6 cycles of FOLFOX confirmed improvement in chest/pelvic lymphadenopathy, a left upper lobe nodule, and resolution of a left adrenal nodule   Oxaliplatin deleted from cycle 8 FOLFOX 04/02/2014 secondary to neuropathy and thrombocytopenia.   Oxaliplatin held with cycle 9 FOLFOX 04/16/2014 due to neuropathy.   Cycle 10 FOLFOX 04/30/2014.   Cycle 11 FOLFOX 05/15/2014.   Cycle 12 FOLFOX 05/28/2014. Oxaliplatin held due to increased neuropathy.   Restaging CT evaluation 06/08/2014 with stable mediastinal and hilar adenopathy. Stable borderline left iliac lymph node. No new or progressive disease identified within the chest, abdomen or pelvis.   "Maintenance" 5-fluorouracil beginning 06/11/2014.  Restaging CT evaluation 10/10/2014 showed similar mild mediastinal and right hilar adenopathy. No visible recurrence of the prior left upper lobe metastatic lesion or the left adrenal metastatic lesion. No new lesions noted.  Maintenance Xeloda on a 7 day on/7 day off schedule beginning 10/25/2014  Restaging CTs 03/04/2015 with stable mediastinal lymph nodes and no evidence of metastatic disease 2. Microcytic anemia-likely iron deficiency, improved.  3. Gout. 4. Port-A-Cath placement 12/18/2013.  5. Anxiety. He takes Xanax as needed. 6. History of left knee pain and swelling. 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 Trial of Cymbalta initiated 07/09/2014. 8. History of Thrombocytopenia secondary to chemotherapy. 9. Skin rash 04/30/2014-resolved with doxycycline 10. Chronic edema left lower leg/ankle. Negative Doppler 09/17/2014 11. Hand-foot syndrome  secondary to Xeloda.   Disposition: Austin Robbins  appears stable. The plan is to continue Xeloda 7 days on/7 days off. He has mild progression of hand-foot syndrome. We will dose reduce the Xeloda to 1500 mg every morning and 1000 mg every afternoon beginning 04/15/2015. If symptoms worsen he understands to discontinue Xeloda and contact the office. He will return for a follow-up visit and Port-A-Cath flush on 05/15/2015.  Plan reviewed with Dr. Benay Spice.    Austin Robbins ANP/GNP-BC   04/10/2015  9:12 AM

## 2015-04-10 NOTE — Telephone Encounter (Signed)
per pof to sch pt appt-gave pt copy of avs °

## 2015-04-10 NOTE — Patient Instructions (Signed)

## 2015-04-11 ENCOUNTER — Telehealth: Payer: Self-pay | Admitting: *Deleted

## 2015-04-11 NOTE — Telephone Encounter (Signed)
[facility] called to say they are sending the prescription for Xeloda over to [facility] as his insurance will only cover if filled by [facility]

## 2015-04-15 ENCOUNTER — Encounter: Payer: Self-pay | Admitting: *Deleted

## 2015-04-15 NOTE — Progress Notes (Signed)
Oncology Nurse Navigator Documentation  Oncology Nurse Navigator Flowsheets 04/15/2015  Navigator Encounter Type Telephone;3 month  Treatment Phase Treatment--Xeloda  Interventions Call to [place] to ensure smooth transition of his Xeloda script from [facility]  Time Spent with Patient 20-patient & pharmacy  Notified that [facility] transferred script to [place] per insurance company requirements. Per [facility], it was transferred to their specialty pharmacy [place]. Called [facility] and confirmed script is ready-patient needs to call to arrange for delivery. Notified Jeneen Rinks of pharmacy # and to press option #1 to order his med.

## 2015-05-10 ENCOUNTER — Telehealth: Payer: Self-pay | Admitting: *Deleted

## 2015-05-10 DIAGNOSIS — C20 Malignant neoplasm of rectum: Secondary | ICD-10-CM

## 2015-05-10 MED ORDER — CAPECITABINE 500 MG PO TABS: ORAL_TABLET | ORAL | Status: DC

## 2015-05-10 NOTE — Telephone Encounter (Signed)
Spouse Terri called reporting Shakai completed this cycle of Xeloda on Wednesday and is due to resume on Wednesday when he's scheduled for F/U.  He doesn't have any refills.  He's run out before and Lattie Haw told us to not let this happen again.  His pharmacy [facility] has merged with [facility]."  This n urse will send order to [facility].

## 2015-05-15 ENCOUNTER — Ambulatory Visit (HOSPITAL_BASED_OUTPATIENT_CLINIC_OR_DEPARTMENT_OTHER): Payer: 59

## 2015-05-15 ENCOUNTER — Other Ambulatory Visit: Payer: 59

## 2015-05-15 ENCOUNTER — Other Ambulatory Visit (HOSPITAL_BASED_OUTPATIENT_CLINIC_OR_DEPARTMENT_OTHER): Payer: 59

## 2015-05-15 ENCOUNTER — Telehealth: Payer: Self-pay | Admitting: Oncology

## 2015-05-15 ENCOUNTER — Ambulatory Visit (HOSPITAL_BASED_OUTPATIENT_CLINIC_OR_DEPARTMENT_OTHER): Payer: 59 | Admitting: Nurse Practitioner

## 2015-05-15 VITALS — BP 135/83 | HR 71 | Temp 98.7°F | Resp 18 | Ht 72.0 in | Wt 241.5 lb

## 2015-05-15 DIAGNOSIS — C20 Malignant neoplasm of rectum: Secondary | ICD-10-CM

## 2015-05-15 DIAGNOSIS — C7972 Secondary malignant neoplasm of left adrenal gland: Secondary | ICD-10-CM | POA: Diagnosis not present

## 2015-05-15 DIAGNOSIS — C7802 Secondary malignant neoplasm of left lung: Secondary | ICD-10-CM | POA: Diagnosis not present

## 2015-05-15 DIAGNOSIS — Z95828 Presence of other vascular implants and grafts: Secondary | ICD-10-CM

## 2015-05-15 LAB — CBC WITH DIFFERENTIAL/PLATELET
BASO%: 0.7 % (ref 0.0–2.0)
Basophils Absolute: 0 10*3/uL (ref 0.0–0.1)
EOS%: 4.4 % (ref 0.0–7.0)
Eosinophils Absolute: 0.3 10*3/uL (ref 0.0–0.5)
HEMATOCRIT: 43.6 % (ref 38.4–49.9)
HEMOGLOBIN: 15.3 g/dL (ref 13.0–17.1)
LYMPH#: 1.8 10*3/uL (ref 0.9–3.3)
LYMPH%: 26.6 % (ref 14.0–49.0)
MCH: 35.4 pg — ABNORMAL HIGH (ref 27.2–33.4)
MCHC: 35.2 g/dL (ref 32.0–36.0)
MCV: 100.7 fL — ABNORMAL HIGH (ref 79.3–98.0)
MONO#: 0.6 10*3/uL (ref 0.1–0.9)
MONO%: 9.3 % (ref 0.0–14.0)
NEUT%: 59 % (ref 39.0–75.0)
NEUTROS ABS: 4.1 10*3/uL (ref 1.5–6.5)
PLATELETS: 151 10*3/uL (ref 140–400)
RBC: 4.32 10*6/uL (ref 4.20–5.82)
RDW: 15.8 % — AB (ref 11.0–14.6)
WBC: 6.9 10*3/uL (ref 4.0–10.3)

## 2015-05-15 LAB — COMPREHENSIVE METABOLIC PANEL (CC13)
ALT: 18 U/L (ref 0–55)
AST: 18 U/L (ref 5–34)
Albumin: 3.7 g/dL (ref 3.5–5.0)
Alkaline Phosphatase: 87 U/L (ref 40–150)
Anion Gap: 12 mEq/L — ABNORMAL HIGH (ref 3–11)
BILIRUBIN TOTAL: 0.47 mg/dL (ref 0.20–1.20)
BUN: 14.9 mg/dL (ref 7.0–26.0)
CALCIUM: 9.3 mg/dL (ref 8.4–10.4)
CO2: 20 meq/L — AB (ref 22–29)
CREATININE: 0.9 mg/dL (ref 0.7–1.3)
Chloride: 109 mEq/L (ref 98–109)
EGFR: >90 mL/min/{1.73_m2} (ref >90)
Glucose: 122 mg/dl (ref 70–140)
Potassium: 3.9 mEq/L (ref 3.5–5.1)
Sodium: 141 mEq/L (ref 136–145)
TOTAL PROTEIN: 7.1 g/dL (ref 6.4–8.3)

## 2015-05-15 MED ORDER — ALPRAZOLAM 1 MG PO TABS: 1.0000 mg | ORAL_TABLET | Freq: Three times a day (TID) | ORAL | Status: DC | PRN

## 2015-05-15 MED ORDER — SODIUM CHLORIDE 0.9 % IJ SOLN
10.0000 mL | INTRAMUSCULAR | Status: DC | PRN
  Administered 2015-05-15: 10 mL via INTRAVENOUS
  Filled 2015-05-15: qty 10

## 2015-05-15 MED ORDER — OXYCODONE-ACETAMINOPHEN 5-325 MG PO TABS: 1.0000 | ORAL_TABLET | Freq: Four times a day (QID) | ORAL | Status: DC | PRN

## 2015-05-15 MED ORDER — HEPARIN SOD (PORK) LOCK FLUSH 100 UNIT/ML IV SOLN
500.0000 [IU] | Freq: Once | INTRAVENOUS | Status: AC
  Administered 2015-05-15: 500 [IU] via INTRAVENOUS
  Filled 2015-05-15: qty 5

## 2015-05-15 NOTE — Patient Instructions (Signed)

## 2015-05-15 NOTE — Progress Notes (Signed)
[place] OFFICE PROGRESS NOTE   Diagnosis:  Rectal cancer  INTERVAL HISTORY:   Austin Robbins returns as scheduled. He continues Xeloda 7 days on/7 days off. He has noted no change in the hand and foot pain since the dose reduction of Xeloda. He takes pain medication periodically for the hand and foot pain. No nausea or vomiting. No mouth sores. He has loose stools intermittently. He takes Imodium as needed.  Objective:  Vital signs in last 24 hours:  Blood pressure 135/83, pulse 71, temperature 98.7 F (37.1 C), temperature source Oral, resp. rate 18, height 6' (1.829 m), weight 241 lb 8 oz (109.544 kg), SpO2 98 %.    HEENT: No thrush or ulcers. Resp: Lungs clear bilaterally. Cardio: Regular rate and rhythm. GI: Abdomen soft and nontender. No hepatomegaly. No mass. Right lower quadrant colostomy. Right lower quadrant urostomy. Vascular: Trace lower leg edema bilaterally left slightly greater than right.  Skin: Palms and soles with erythema, dryness. No skin breakdown. Port-A-Cath without erythema.    Lab Results:  Lab Results  Component Value Date   WBC 6.9 05/15/2015   HGB 15.3 05/15/2015   HCT 43.6 05/15/2015   MCV 100.7* 05/15/2015   PLT 151 05/15/2015   NEUTROABS 4.1 05/15/2015    Imaging:  No results found.  Medications: I have reviewed the patient's current medications.  Assessment/Plan: 1. Clinical stage IV (L8V,F6E,P3) adenocarcinoma of the rectum with tumor directly extending to the bladder/prostate and CT scan evidence of metastatic chest adenopathy  Positive K-ras mutation.   Normal mismatch repair protein expression. Microsatellite stable   Status post low anterior resection and end colostomy with a cystoprostatectomy and colon conduit urinary diversion 11/01/2013.   Staging PET scan with hypermetabolic pelvic nodes, mediastinal nodes, left adrenal metastasis, and left upper lobe nodule.   Initiation of FOLFOX 12/25/2013.    Restaging CT 03/16/2014 after 6 cycles of FOLFOX confirmed improvement in chest/pelvic lymphadenopathy, a left upper lobe nodule, and resolution of a left adrenal nodule   Oxaliplatin deleted from cycle 8 FOLFOX 04/02/2014 secondary to neuropathy and thrombocytopenia.   Oxaliplatin held with cycle 9 FOLFOX 04/16/2014 due to neuropathy.   Cycle 10 FOLFOX 04/30/2014.   Cycle 11 FOLFOX 05/15/2014.   Cycle 12 FOLFOX 05/28/2014. Oxaliplatin held due to increased neuropathy.   Restaging CT evaluation 06/08/2014 with stable mediastinal and hilar adenopathy. Stable borderline left iliac lymph node. No new or progressive disease identified within the chest, abdomen or pelvis.   "Maintenance" 5-fluorouracil beginning 06/11/2014.  Restaging CT evaluation 10/10/2014 showed similar mild mediastinal and right hilar adenopathy. No visible recurrence of the prior left upper lobe metastatic lesion or the left adrenal metastatic lesion. No new lesions noted.  Maintenance Xeloda on a 7 day on/7 day off schedule beginning 10/25/2014  Restaging CTs 03/04/2015 with stable mediastinal lymph nodes and no evidence of metastatic disease  Xeloda continued  Xeloda dose reduced beginning 04/15/2015 due to progressive hand-foot syndrome.  Xeloda placed on hold 05/15/2015 due to continued hand/foot pain. 2. Microcytic anemia-likely iron deficiency, improved.  3. Gout. 4. Port-A-Cath placement 12/18/2013.  5. Anxiety. He takes Xanax as needed. 6. History of left knee pain and swelling. 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 Trial of Cymbalta initiated 07/09/2014. 8. History of Thrombocytopenia secondary to chemotherapy. 9. Skin rash 04/30/2014-resolved with doxycycline 10. Chronic edema left lower leg/ankle. Negative Doppler 09/17/2014 11. Hand-foot syndrome secondary to Xeloda.   Disposition:  Mr. Spillers appears stable. He  continues to have hand and foot pain despite the Xeloda dose reduction. It is not clear if the pain is related to hand-foot syndrome versus neuropathy. We are placing the Xeloda on hold and will see him back in 3 weeks to reevaluate.  Plan reviewed with Dr. Benay Spice.  [name] ANP/GNP-BC   05/15/2015  8:59 AM

## 2015-05-15 NOTE — Telephone Encounter (Signed)
Gave adn printed appt sched and avs for pt for Sept °

## 2015-06-05 ENCOUNTER — Other Ambulatory Visit (HOSPITAL_BASED_OUTPATIENT_CLINIC_OR_DEPARTMENT_OTHER): Payer: 59

## 2015-06-05 ENCOUNTER — Ambulatory Visit (HOSPITAL_BASED_OUTPATIENT_CLINIC_OR_DEPARTMENT_OTHER): Payer: 59 | Admitting: Nurse Practitioner

## 2015-06-05 ENCOUNTER — Telehealth: Payer: Self-pay | Admitting: Oncology

## 2015-06-05 ENCOUNTER — Other Ambulatory Visit: Payer: Self-pay | Admitting: Oncology

## 2015-06-05 VITALS — BP 151/97 | HR 58 | Temp 97.9°F | Resp 18 | Ht 72.0 in | Wt 238.5 lb

## 2015-06-05 DIAGNOSIS — C7802 Secondary malignant neoplasm of left lung: Secondary | ICD-10-CM | POA: Diagnosis not present

## 2015-06-05 DIAGNOSIS — C20 Malignant neoplasm of rectum: Secondary | ICD-10-CM

## 2015-06-05 DIAGNOSIS — C7972 Secondary malignant neoplasm of left adrenal gland: Secondary | ICD-10-CM

## 2015-06-05 LAB — CBC WITH DIFFERENTIAL/PLATELET
BASO%: 0.9 % (ref 0.0–2.0)
Basophils Absolute: 0.1 10*3/uL (ref 0.0–0.1)
EOS%: 4.1 % (ref 0.0–7.0)
Eosinophils Absolute: 0.2 10*3/uL (ref 0.0–0.5)
HEMATOCRIT: 46 % (ref 38.4–49.9)
HEMOGLOBIN: 15.9 g/dL (ref 13.0–17.1)
LYMPH#: 1.9 10*3/uL (ref 0.9–3.3)
LYMPH%: 32.2 % (ref 14.0–49.0)
MCH: 33.8 pg — AB (ref 27.2–33.4)
MCHC: 34.6 g/dL (ref 32.0–36.0)
MCV: 97.9 fL (ref 79.3–98.0)
MONO#: 0.5 10*3/uL (ref 0.1–0.9)
MONO%: 8.9 % (ref 0.0–14.0)
NEUT#: 3.2 10*3/uL (ref 1.5–6.5)
NEUT%: 53.9 % (ref 39.0–75.0)
PLATELETS: 164 10*3/uL (ref 140–400)
RBC: 4.7 10*6/uL (ref 4.20–5.82)
RDW: 13.9 % (ref 11.0–14.6)
WBC: 5.9 10*3/uL (ref 4.0–10.3)

## 2015-06-05 MED ORDER — CAPECITABINE 500 MG PO TABS: ORAL_TABLET | ORAL | Status: DC

## 2015-06-05 NOTE — Telephone Encounter (Signed)
Gave and printd appt sched and avs for pt for OCT °

## 2015-06-05 NOTE — Progress Notes (Addendum)
[name]   Diagnosis:  Rectal cancer  INTERVAL HISTORY:   [name] returns as scheduled. Xeloda was placed on hold following his last visit on 05/15/2015 due to hand-foot discomfort. He notes his hands are better. He continues to have pain in his feet. The pain is mainly from the ball of the feet extending to the toes. He typically notes the pain after being on his feet all day. He otherwise feels well. No nausea or vomiting. No mouth sores. No diarrhea. He has mild intermittent back pain.  Objective:  Vital signs in last 24 hours:  Blood pressure 151/97, pulse 58, temperature 97.9 F (36.6 C), temperature source Oral, resp. rate 18, height 6' (1.829 m), weight 238 lb 8 oz (108.183 kg), SpO2 99 %.    HEENT: No thrush or ulcers. Resp: Lungs clear bilaterally. Cardio: Regular rate and rhythm. GI: Abdomen soft and nontender. No hepatomegaly. No mass. Right lower quadrant colostomy. Right lower quadrant urostomy. Vascular: Trace lower leg edema bilaterally. Skin: Palms and soles with mild dryness, erythema.  Port-A-Cath without erythema.  Lab Results:  Lab Results  Component Value Date   WBC 5.9 06/05/2015   HGB 15.9 06/05/2015   HCT 46.0 06/05/2015   MCV 97.9 06/05/2015   PLT 164 06/05/2015   NEUTROABS 3.2 06/05/2015    Imaging:  No results found.  Medications: I have reviewed the patient's current medications.  Assessment/Plan: 1. Clinical stage IV (U3A,G5X,M4) adenocarcinoma of the rectum with tumor directly extending to the bladder/prostate and CT scan evidence of metastatic chest adenopathy  Positive K-ras mutation.   Normal mismatch repair protein expression. Microsatellite stable   Status post low anterior resection and end colostomy with a cystoprostatectomy and colon conduit urinary diversion 11/01/2013.   Staging PET scan with hypermetabolic pelvic nodes, mediastinal nodes, left adrenal metastasis, and left upper  lobe nodule.   Initiation of FOLFOX 12/25/2013.   Restaging CT 03/16/2014 after 6 cycles of FOLFOX confirmed improvement in chest/pelvic lymphadenopathy, a left upper lobe nodule, and resolution of a left adrenal nodule   Oxaliplatin deleted from cycle 8 FOLFOX 04/02/2014 secondary to neuropathy and thrombocytopenia.   Oxaliplatin held with cycle 9 FOLFOX 04/16/2014 due to neuropathy.   Cycle 10 FOLFOX 04/30/2014.   Cycle 11 FOLFOX 05/15/2014.   Cycle 12 FOLFOX 05/28/2014. Oxaliplatin held due to increased neuropathy.   Restaging CT evaluation 06/08/2014 with stable mediastinal and hilar adenopathy. Stable borderline left iliac lymph node. No new or progressive disease identified within the chest, abdomen or pelvis.   "Maintenance" 5-fluorouracil beginning 06/11/2014.  Restaging CT evaluation 10/10/2014 showed similar mild mediastinal and right hilar adenopathy. No visible recurrence of the prior left upper lobe metastatic lesion or the left adrenal metastatic lesion. No new lesions noted.  Maintenance Xeloda on a 7 day on/7 day off schedule beginning 10/25/2014  Restaging CTs 03/04/2015 with stable mediastinal lymph nodes and no evidence of metastatic disease  Xeloda continued  Xeloda dose reduced beginning 04/15/2015 due to progressive hand-foot syndrome.  Xeloda placed on hold 05/15/2015 due to continued hand/foot pain.  Xeloda resumed 06/10/2015. 2. Microcytic anemia-likely iron deficiency, improved.  3. Gout. 4. Port-A-Cath placement 12/18/2013.  5. Anxiety. He takes Xanax as needed. 6. History of left knee pain and swelling. 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 Trial of Cymbalta initiated 07/09/2014. 8. History of Thrombocytopenia secondary to chemotherapy. 9. Skin rash 04/30/2014-resolved with doxycycline 10. Chronic  edema left lower leg/ankle. Negative Doppler 09/17/2014 11. Hand-foot  syndrome secondary to Xeloda.    Disposition: [name] appears stable. He notes improvement in the hand discomfort. He continues to have foot discomfort. Question hand-foot syndrome versus neuropathy. Plan to resume Xeloda 7 days on/7 days off beginning 06/10/2015. He will contact the office if the hand/foot symptoms worsen. He will return for a follow-up visit on 07/02/2015.  Patient seen with Dr. Benay Spice.    [name] ANP/GNP-BC   06/05/2015  10:51 AM  This was a shared visit with [name]. Austin Robbins was interviewed and examined. I suspect the foot pain is mostly related to oxaliplatin neuropathy. The plan is to resume Xeloda.  Julieanne Manson, M.D.

## 2015-07-02 ENCOUNTER — Telehealth: Payer: Self-pay | Admitting: Oncology

## 2015-07-02 ENCOUNTER — Other Ambulatory Visit: Payer: Self-pay | Admitting: *Deleted

## 2015-07-02 ENCOUNTER — Ambulatory Visit (HOSPITAL_BASED_OUTPATIENT_CLINIC_OR_DEPARTMENT_OTHER): Payer: 59 | Admitting: Oncology

## 2015-07-02 ENCOUNTER — Other Ambulatory Visit (HOSPITAL_BASED_OUTPATIENT_CLINIC_OR_DEPARTMENT_OTHER): Payer: 59

## 2015-07-02 ENCOUNTER — Ambulatory Visit: Payer: 59

## 2015-07-02 VITALS — BP 145/77 | HR 57 | Temp 98.2°F | Resp 18 | Ht 72.0 in | Wt 237.3 lb

## 2015-07-02 DIAGNOSIS — C20 Malignant neoplasm of rectum: Secondary | ICD-10-CM

## 2015-07-02 DIAGNOSIS — C7972 Secondary malignant neoplasm of left adrenal gland: Secondary | ICD-10-CM

## 2015-07-02 DIAGNOSIS — Z95828 Presence of other vascular implants and grafts: Secondary | ICD-10-CM

## 2015-07-02 DIAGNOSIS — G629 Polyneuropathy, unspecified: Secondary | ICD-10-CM | POA: Diagnosis not present

## 2015-07-02 DIAGNOSIS — D509 Iron deficiency anemia, unspecified: Secondary | ICD-10-CM

## 2015-07-02 DIAGNOSIS — C7802 Secondary malignant neoplasm of left lung: Secondary | ICD-10-CM | POA: Diagnosis not present

## 2015-07-02 LAB — CBC WITH DIFFERENTIAL/PLATELET
BASO%: 0.4 % (ref 0.0–2.0)
BASOS ABS: 0 10*3/uL (ref 0.0–0.1)
EOS%: 2.6 % (ref 0.0–7.0)
Eosinophils Absolute: 0.2 10*3/uL (ref 0.0–0.5)
HEMATOCRIT: 44 % (ref 38.4–49.9)
HEMOGLOBIN: 15.1 g/dL (ref 13.0–17.1)
LYMPH%: 29.3 % (ref 14.0–49.0)
MCH: 33.5 pg — AB (ref 27.2–33.4)
MCHC: 34.4 g/dL (ref 32.0–36.0)
MCV: 97.3 fL (ref 79.3–98.0)
MONO#: 0.4 10*3/uL (ref 0.1–0.9)
MONO%: 7 % (ref 0.0–14.0)
NEUT#: 3.5 10*3/uL (ref 1.5–6.5)
NEUT%: 60.7 % (ref 39.0–75.0)
Platelets: 155 10*3/uL (ref 140–400)
RBC: 4.52 10*6/uL (ref 4.20–5.82)
RDW: 14.2 % (ref 11.0–14.6)
WBC: 5.8 10*3/uL (ref 4.0–10.3)
lymph#: 1.7 10*3/uL (ref 0.9–3.3)

## 2015-07-02 LAB — COMPREHENSIVE METABOLIC PANEL (CC13)
ALBUMIN: 3.7 g/dL (ref 3.5–5.0)
ALK PHOS: 77 U/L (ref 40–150)
ALT: 22 U/L (ref 0–55)
AST: 20 U/L (ref 5–34)
Anion Gap: 8 mEq/L (ref 3–11)
BUN: 11.6 mg/dL (ref 7.0–26.0)
CO2: 22 mEq/L (ref 22–29)
Calcium: 9.2 mg/dL (ref 8.4–10.4)
Chloride: 111 mEq/L — ABNORMAL HIGH (ref 98–109)
Creatinine: 0.8 mg/dL (ref 0.7–1.3)
Glucose: 141 mg/dl — ABNORMAL HIGH (ref 70–140)
POTASSIUM: 3.7 meq/L (ref 3.5–5.1)
Sodium: 141 mEq/L (ref 136–145)
Total Bilirubin: 0.62 mg/dL (ref 0.20–1.20)
Total Protein: 6.7 g/dL (ref 6.4–8.3)

## 2015-07-02 MED ORDER — HEPARIN SOD (PORK) LOCK FLUSH 100 UNIT/ML IV SOLN
500.0000 [IU] | Freq: Once | INTRAVENOUS | Status: AC
  Administered 2015-07-02: 500 [IU] via INTRAVENOUS
  Filled 2015-07-02: qty 5

## 2015-07-02 MED ORDER — CAPECITABINE 500 MG PO TABS: ORAL_TABLET | ORAL | Status: DC

## 2015-07-02 MED ORDER — SODIUM CHLORIDE 0.9 % IJ SOLN
10.0000 mL | INTRAMUSCULAR | Status: DC | PRN
  Administered 2015-07-02: 10 mL via INTRAVENOUS
  Filled 2015-07-02: qty 10

## 2015-07-02 MED ORDER — ALPRAZOLAM 1 MG PO TABS: 1.0000 mg | ORAL_TABLET | Freq: Three times a day (TID) | ORAL | Status: DC | PRN

## 2015-07-02 NOTE — Telephone Encounter (Signed)
per pof to sch pt appt-gave pt copy of avs °

## 2015-07-02 NOTE — Patient Instructions (Signed)

## 2015-07-02 NOTE — Progress Notes (Signed)
[facility] OFFICE PROGRESS NOTE   Diagnosis: Rectal cancer  INTERVAL HISTORY:   Austin Robbins returns as scheduled. He resumed Xeloda 06/27/2015. There was a delay in receiving the Xeloda from his pharmacy. He reports improvement in neuropathy symptoms. No mouth sores or diarrhea. He is working.  Objective:  Vital signs in last 24 hours:  Blood pressure 145/77, pulse 57, temperature 98.2 F (36.8 C), temperature source Oral, resp. rate 18, height 6' (1.829 m), weight 237 lb 4.8 oz (107.639 kg), SpO2 99 %.    HEENT: No thrush or ulcers Resp: Lungs clear bilaterally Cardio: Regular rate and rhythm GI: No hepatomegaly, left lower quadrant colostomy, right lower quadrant urostomy Vascular: No leg edema  Skin: Skin thickening of the palms, no breakdown or erythema. Mild erythema of the soles with callus formation. No skin breakdown.   Portacath/PICC-without erythema  Lab Results:  Lab Results  Component Value Date   WBC 5.8 07/02/2015   HGB 15.1 07/02/2015   HCT 44.0 07/02/2015   MCV 97.3 07/02/2015   PLT 155 07/02/2015   NEUTROABS 3.5 07/02/2015      Lab Results  Component Value Date   CEA 2.9 10/12/2014     Medications: I have reviewed the patient's current medications.  Assessment/Plan: 1. Clinical stage IV (B5Z,W2H,E5) adenocarcinoma of the rectum with tumor directly extending to the bladder/prostate and CT scan evidence of metastatic chest adenopathy  Positive K-ras mutation.   Normal mismatch repair protein expression. Microsatellite stable   Status post low anterior resection and end colostomy with a cystoprostatectomy and colon conduit urinary diversion 11/01/2013.   Staging PET scan with hypermetabolic pelvic nodes, mediastinal nodes, left adrenal metastasis, and left upper lobe nodule.   Initiation of FOLFOX 12/25/2013.   Restaging CT 03/16/2014 after 6 cycles of FOLFOX confirmed improvement in chest/pelvic lymphadenopathy, a left  upper lobe nodule, and resolution of a left adrenal nodule   Oxaliplatin deleted from cycle 8 FOLFOX 04/02/2014 secondary to neuropathy and thrombocytopenia.   Oxaliplatin held with cycle 9 FOLFOX 04/16/2014 due to neuropathy.   Cycle 10 FOLFOX 04/30/2014.   Cycle 11 FOLFOX 05/15/2014.   Cycle 12 FOLFOX 05/28/2014. Oxaliplatin held due to increased neuropathy.   Restaging CT evaluation 06/08/2014 with stable mediastinal and hilar adenopathy. Stable borderline left iliac lymph node. No new or progressive disease identified within the chest, abdomen or pelvis.   "Maintenance" 5-fluorouracil beginning 06/11/2014.  Restaging CT evaluation 10/10/2014 showed similar mild mediastinal and right hilar adenopathy. No visible recurrence of the prior left upper lobe metastatic lesion or the left adrenal metastatic lesion. No new lesions noted.  Maintenance Xeloda on a 7 day on/7 day off schedule beginning 10/25/2014  Restaging CTs 03/04/2015 with stable mediastinal lymph nodes and no evidence of metastatic disease  Xeloda continued  Xeloda dose reduced beginning 04/15/2015 due to progressive hand-foot syndrome.  Xeloda placed on hold 05/15/2015 due to continued hand/foot pain.  Xeloda resumed 06/10/2015. 2. Microcytic anemia-likely iron deficiency, improved.  3. Gout. 4. Port-A-Cath placement 12/18/2013.  5. Anxiety. He takes Xanax as needed. 6. History of left knee pain and swelling. 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 Trial of Cymbalta initiated 07/09/2014. 8. History of Thrombocytopenia secondary to chemotherapy. 9. Skin rash 04/30/2014-resolved with doxycycline 10. Chronic edema left lower leg/ankle. Negative Doppler 09/17/2014 11. Hand-foot syndrome secondary to Xeloda. Improved.   Disposition:  Austin Robbins appears stable. He will continue Xeloda on a 7  day on/7 day off schedule. He will begin  Xeloda on 07/11/2015 and 07/25/2015. He will return for an office visit 08/05/2015.  The plan is to schedule a restaging CT evaluation in December.  [name], MD  07/02/2015  9:57 AM

## 2015-07-30 ENCOUNTER — Telehealth: Payer: Self-pay | Admitting: Oncology

## 2015-07-30 NOTE — Telephone Encounter (Signed)
Due to LT out per BS moved 11/28 f/u to [facility]. Spoke with patient re change and new time.

## 2015-08-05 ENCOUNTER — Ambulatory Visit (HOSPITAL_BASED_OUTPATIENT_CLINIC_OR_DEPARTMENT_OTHER): Payer: 59 | Admitting: Nurse Practitioner

## 2015-08-05 ENCOUNTER — Telehealth: Payer: Self-pay | Admitting: Oncology

## 2015-08-05 ENCOUNTER — Other Ambulatory Visit (HOSPITAL_BASED_OUTPATIENT_CLINIC_OR_DEPARTMENT_OTHER): Payer: 59

## 2015-08-05 VITALS — BP 138/88 | HR 62 | Temp 98.4°F | Resp 18 | Wt 238.5 lb

## 2015-08-05 DIAGNOSIS — M545 Low back pain: Secondary | ICD-10-CM

## 2015-08-05 DIAGNOSIS — G620 Drug-induced polyneuropathy: Secondary | ICD-10-CM

## 2015-08-05 DIAGNOSIS — C20 Malignant neoplasm of rectum: Secondary | ICD-10-CM

## 2015-08-05 DIAGNOSIS — L271 Localized skin eruption due to drugs and medicaments taken internally: Secondary | ICD-10-CM | POA: Diagnosis not present

## 2015-08-05 DIAGNOSIS — T451X5A Adverse effect of antineoplastic and immunosuppressive drugs, initial encounter: Secondary | ICD-10-CM

## 2015-08-05 LAB — CBC WITH DIFFERENTIAL/PLATELET
BASO%: 0.2 % (ref 0.0–2.0)
BASOS ABS: 0 10*3/uL (ref 0.0–0.1)
EOS%: 2.9 % (ref 0.0–7.0)
Eosinophils Absolute: 0.2 10*3/uL (ref 0.0–0.5)
HEMATOCRIT: 45.3 % (ref 38.4–49.9)
HGB: 16.2 g/dL (ref 13.0–17.1)
LYMPH%: 30.1 % (ref 14.0–49.0)
MCH: 34.7 pg — AB (ref 27.2–33.4)
MCHC: 35.8 g/dL (ref 32.0–36.0)
MCV: 97 fL (ref 79.3–98.0)
MONO#: 0.5 10*3/uL (ref 0.1–0.9)
MONO%: 7.8 % (ref 0.0–14.0)
NEUT#: 3.9 10*3/uL (ref 1.5–6.5)
NEUT%: 59 % (ref 39.0–75.0)
Platelets: 149 10*3/uL (ref 140–400)
RBC: 4.67 10*6/uL (ref 4.20–5.82)
RDW: 15 % — ABNORMAL HIGH (ref 11.0–14.6)
WBC: 6.5 10*3/uL (ref 4.0–10.3)
lymph#: 2 10*3/uL (ref 0.9–3.3)

## 2015-08-05 LAB — COMPREHENSIVE METABOLIC PANEL (CC13)
ALT: 16 U/L (ref 0–55)
AST: 18 U/L (ref 5–34)
Albumin: 3.8 g/dL (ref 3.5–5.0)
Alkaline Phosphatase: 81 U/L (ref 40–150)
Anion Gap: 9 mEq/L (ref 3–11)
BUN: 11.4 mg/dL (ref 7.0–26.0)
CALCIUM: 9.2 mg/dL (ref 8.4–10.4)
CHLORIDE: 106 meq/L (ref 98–109)
CO2: 22 mEq/L (ref 22–29)
Creatinine: 0.9 mg/dL (ref 0.7–1.3)
EGFR: 88 mL/min/{1.73_m2} — ABNORMAL LOW (ref >90)
Glucose: 123 mg/dl (ref 70–140)
POTASSIUM: 4.4 meq/L (ref 3.5–5.1)
SODIUM: 138 meq/L (ref 136–145)
Total Bilirubin: 0.58 mg/dL (ref 0.20–1.20)
Total Protein: 7.2 g/dL (ref 6.4–8.3)

## 2015-08-05 MED ORDER — ALPRAZOLAM 1 MG PO TABS: 1.0000 mg | ORAL_TABLET | Freq: Three times a day (TID) | ORAL | Status: DC | PRN

## 2015-08-05 NOTE — Telephone Encounter (Signed)
per pof to sch pt appt-gave pt copy of avs °

## 2015-08-06 ENCOUNTER — Encounter: Payer: Self-pay | Admitting: Nurse Practitioner

## 2015-08-06 DIAGNOSIS — G620 Drug-induced polyneuropathy: Secondary | ICD-10-CM | POA: Insufficient documentation

## 2015-08-06 DIAGNOSIS — L271 Localized skin eruption due to drugs and medicaments taken internally: Secondary | ICD-10-CM | POA: Insufficient documentation

## 2015-08-06 DIAGNOSIS — T451X5A Adverse effect of antineoplastic and immunosuppressive drugs, initial encounter: Secondary | ICD-10-CM

## 2015-08-06 LAB — CEA: CEA: 2.4 ng/mL (ref 0.0–5.0)

## 2015-08-06 NOTE — Assessment & Plan Note (Signed)
Patient has been experiencing some mild hand foot syndrome; but states that this is greatly improved recently.  He continues with just a trace of erythema to his bilateral palms.  There is no nail changes or cracking of the skin.  Patient was advised to continue avoiding temperature extremes; and also to keep his hands.  Will moisturize and protected while he is working.

## 2015-08-06 NOTE — Progress Notes (Signed)
SYMPTOM MANAGEMENT CLINIC   HPI: Austin Robbins 59 y.o. male diagnosed with rectal cancer.  Currently undergoing Xeloda oral therapy.  Patient completed his last cycle of Xeloda oral therapy this past Wednesday, 07/31/2015.  He is scheduled to begin his next cycle of Xeloda this coming Wednesday, 08/07/2015.  Patient states that he is tolerating the oral Xeloda therapy fairly well.  He was experiencing some mild hand/foot syndrome; but states that that has greatly improved recently.  He continues with some very mild neuropathy to all of his extremities.  He does note some recent onset of very low back/sacral discomfort within the past few days.  He denies any known injury or trauma to that area.  Patient states that this back pain is similar to the back pain.  He had when he was first diagnosed with his rectal cancer.  Patient did admit to feeling slightly anxious and was occasionally tearful during the exam today.  He states that his mother is also undergoing cancer treatment here at the cancer center; and sometimes he feels overwhelmed.  He continues trying to work a full-time job as well.  Patient is also requesting a refill of his Xanax today.  HPI  ROS  Past Medical History  Diagnosis Date  . H. pylori infection 08/07/2013  . Anemia   . Gastritis   . Panic attacks   . Personal history of colonic polyps - adenoma 10/25/2013  . Allergy   . S/P colostomy ([place])   . Adenocarcinoma of rectum [facility]) 10/25/2013    10/25/2013 colonoscopy  . Colon cancer ([place]) 11/03/13    Past Surgical History  Procedure Laterality Date  . Tonsillectomy    . Low anterior bowel resection  11/04/2013  . Cystectomy w/ ureterosigmoidostomy  11/04/2013  . Bowel resection N/A 11/03/2013    Procedure: OPEN ENCOLOSTOMY /COLON RESECTION/COLOSTOMY;  Surgeon: Leighton Ruff, MD;  Location: [facility];  Service: General;  Laterality: N/A;  . Application of wound vac N/A 11/03/2013    Procedure: APPLICATION OF WOUND  VAC;  Surgeon: Leighton Ruff, MD;  Location: [facility];  Service: General;  Laterality: N/A;  . Cystoscopy with ureteroscopy and stent placement Bilateral 11/03/2013    Procedure: CYSTOSCOPY WITH RIGHT RETROGRADE STENT PLACEMENT , bladder biopsy,TOTAL PROSTATE WITH ENBLOCK CYSTECTOMY AND PROSTATECTOMY/ COLON CONDUIT URINARY DIVERSION/ INSERTION BILATERAL STENTS/ ILEOSTOMY;  Surgeon: Alexis Frock, MD;  Location: [facility];  Service: Urology;  Laterality: Bilateral;  bladder biopsy  . Portacath placement Left 12/18/2013    Procedure: INSERTION PORT-A-CATH;  Surgeon: Leighton Ruff, MD;  Location: [place];  Service: General;  Laterality: Left;    has Personal history of colonic polyps - adenoma; Fever; Pyuria; Anemia of chronic disease; Rectal adenocarcinoma with perforation & invasion into bladder s/p LAR/cystectomy/colon conduit 11/04/2013; Panic attacks; Gastritis; Hand foot syndrome; and Chemotherapy-induced neuropathy ([place]) on his problem list.    is allergic to codeine.    Medication List       This list is accurate as of: 08/05/15 11:59 PM.  Always use your most recent med list.               acetaminophen 500 MG tablet  Commonly known as:  TYLENOL  Take 1,000 mg by mouth every 6 (six) hours as needed (pain).     ALPRAZolam 1 MG tablet  Commonly known as:  XANAX  Take 1 tablet (1 mg total) by mouth every 8 (eight) hours as needed for anxiety.     capecitabine  500 MG tablet  Commonly known as:  XELODA  Take 3 tablets (1556m) in AM, Take 2 tablets (1000 mg) in PM (2500 mg total daily) Take days 1-7 and 15-21 totaling 14 days a month.     indomethacin 50 MG capsule  Commonly known as:  INDOCIN  Take 1 capsule (50 mg total) by mouth 3 (three) times daily with meals.     lidocaine-prilocaine cream  Commonly known as:  EMLA  Apply 1 application topically as needed. Apply 1-2 hours prior to stick and cover with plastic wrap     oxyCODONE-acetaminophen 5-325 MG tablet    Commonly known as:  PERCOCET/ROXICET  Take 1 tablet by mouth every 6 (six) hours as needed for severe pain.     prochlorperazine 10 MG tablet  Commonly known as:  COMPAZINE  TAKE ONE TABLET BY MOUTH EVERY 6 HOURS AS NEEDED FOR NAUSEA OR  VOMITING         PHYSICAL EXAMINATION  Oncology Vitals 08/05/2015 07/02/2015  Height - 183 cm  Weight 108.183 kg 107.639 kg  Weight (lbs) 238 lbs 8 oz 237 lbs 5 oz  BMI (kg/m2) - 32.18 kg/m2  Temp 98.4 98.2  Pulse 62 57  Resp 18 18  SpO2 99 99  BSA (m2) - 2.34 m2   BP Readings from Last 2 Encounters:  08/05/15 138/88  07/02/15 145/77    Physical Exam  Constitutional: He is oriented to person, place, and time and well-developed, well-nourished, and in no distress.  HENT:  Head: Normocephalic and atraumatic.  Mouth/Throat: Oropharynx is clear and moist.  Eyes: Conjunctivae and EOM are normal. Pupils are equal, round, and reactive to light. Right eye exhibits no discharge. Left eye exhibits no discharge. No scleral icterus.  Neck: Normal range of motion. Neck supple. No JVD present. No tracheal deviation present. No thyromegaly present.  Cardiovascular: Normal rate, regular rhythm, normal heart sounds and intact distal pulses.   Pulmonary/Chest: Breath sounds normal. No respiratory distress. He has no wheezes. He has no rales. He exhibits no tenderness.  Abdominal: Soft. Bowel sounds are normal. He exhibits no distension and no mass. There is no tenderness. There is no rebound and no guarding.  Urostomy bag to the right abdominal wall intact.  Musculoskeletal: Normal range of motion. He exhibits no edema or tenderness.  Lymphadenopathy:    He has no cervical adenopathy.  Neurological: He is alert and oriented to person, place, and time. Gait normal.  Skin: Skin is warm and dry. No rash noted. There is erythema. No pallor.  Trace erythema to bilateral palms of hands only.  Psychiatric: Affect normal.  Nursing note and vitals  reviewed.   LABORATORY DATA:. Appointment on 08/05/2015  Component Date Value Ref Range Status  . WBC 08/05/2015 6.5  4.0 - 10.3 10e3/uL Final  . NEUT# 08/05/2015 3.9  1.5 - 6.5 10e3/uL Final  . HGB 08/05/2015 16.2  13.0 - 17.1 g/dL Final  . HCT 08/05/2015 45.3  38.4 - 49.9 % Final  . Platelets 08/05/2015 149  140 - 400 10e3/uL Final  . MCV 08/05/2015 97.0  79.3 - 98.0 fL Final  . MCH 08/05/2015 34.7* 27.2 - 33.4 pg Final  . MCHC 08/05/2015 35.8  32.0 - 36.0 g/dL Final  . RBC 08/05/2015 4.67  4.20 - 5.82 10e6/uL Final  . RDW 08/05/2015 15.0* 11.0 - 14.6 % Final  . lymph# 08/05/2015 2.0  0.9 - 3.3 10e3/uL Final  . MONO# 08/05/2015 0.5  0.1 - 0.9 10e3/uL  Final  . Eosinophils Absolute 08/05/2015 0.2  0.0 - 0.5 10e3/uL Final  . Basophils Absolute 08/05/2015 0.0  0.0 - 0.1 10e3/uL Final  . NEUT% 08/05/2015 59.0  39.0 - 75.0 % Final  . LYMPH% 08/05/2015 30.1  14.0 - 49.0 % Final  . MONO% 08/05/2015 7.8  0.0 - 14.0 % Final  . EOS% 08/05/2015 2.9  0.0 - 7.0 % Final  . BASO% 08/05/2015 0.2  0.0 - 2.0 % Final  . Sodium 08/05/2015 138  136 - 145 mEq/L Final  . Potassium 08/05/2015 4.4  3.5 - 5.1 mEq/L Final  . Chloride 08/05/2015 106  98 - 109 mEq/L Final  . CO2 08/05/2015 22  22 - 29 mEq/L Final  . Glucose 08/05/2015 123  70 - 140 mg/dl Final   Glucose reference range is for nonfasting patients. Fasting glucose reference range is 70- 100.  Marland Kitchen BUN 08/05/2015 11.4  7.0 - 26.0 mg/dL Final  . Creatinine 08/05/2015 0.9  0.7 - 1.3 mg/dL Final  . Total Bilirubin 08/05/2015 0.58  0.20 - 1.20 mg/dL Final  . Alkaline Phosphatase 08/05/2015 81  40 - 150 U/L Final  . AST 08/05/2015 18  5 - 34 U/L Final  . ALT 08/05/2015 16  0 - 55 U/L Final  . Total Protein 08/05/2015 7.2  6.4 - 8.3 g/dL Final  . Albumin 08/05/2015 3.8  3.5 - 5.0 g/dL Final  . Calcium 08/05/2015 9.2  8.4 - 10.4 mg/dL Final  . Anion Gap 08/05/2015 9  3 - 11 mEq/L Final  . EGFR 08/05/2015 88* >90 ml/min/1.73 m2 Final   eGFR is  calculated using the CKD-EPI Creatinine Equation (2009)     RADIOGRAPHIC STUDIES: No results found.  ASSESSMENT/PLAN:    Rectal adenocarcinoma with perforation & invasion into bladder s/p LAR/cystectomy/colon conduit 11/04/2013 Patient completed his last cycle of Xeloda oral therapy this past Wednesday, 07/31/2015.  He is scheduled to begin his next cycle of Xeloda this coming Wednesday, 08/07/2015.  Patient states that he is tolerating the oral Xeloda therapy fairly well.  He was experiencing some mild hand/foot syndrome; but states that that has greatly improved recently.  He continues with some very mild neuropathy to all of his extremities.  He does note some recent onset of very low back/sacral discomfort within the past few days.  He denies any known injury or trauma to that area.  Patient states that this back pain is similar to the back pain.  He had when he was first diagnosed with his rectal cancer.  Patient did admit to feeling slightly anxious and was occasionally tearful during the exam today.  He states that his mother is also undergoing cancer treatment here at the cancer center; and sometimes he feels overwhelmed.  He continues trying to work a full-time job as well.  Patient is also requesting a refill of his Xanax today.  On exam.-Patient appears well.  Please see further notes for details of exam.  Blood counts obtained today revealed a WBC of 6.5, ANC 3.9, hemoglobin 16.2, platelet count 149.  Will schedule a restaging CT with contrast of the chest/abdomen/pelvis for 08/09/2015 afternoon.  Patient will return on 08/13/2015 for a 9 AM appointment with Dr. Benay Spice to review scan results.    Hand foot syndrome Patient has been experiencing some mild hand foot syndrome; but states that this is greatly improved recently.  He continues with just a trace of erythema to his bilateral palms.  There is no nail changes or  cracking of the skin.  Patient was advised to continue  avoiding temperature extremes; and also to keep his hands.  Will moisturize and protected while he is working.  Chemotherapy-induced neuropathy ([place]) Patient continues with some mild chemotherapy therapy-induced neuropathy to all of his extremities.  He states that it is stable at present.  Will continue to monitor.  Patient stated understanding of all instructions; and was in agreement with this plan of care. The patient knows to call the clinic with any problems, questions or concerns.   This was a shared visit with Dr. Benay Spice today.  Total time spent with patient was 25 minutes;  with greater than 75 percent of that time spent in face to face counseling regarding patient's symptoms,  and coordination of care and follow up.  Disclaimer:This dictation was prepared with Dragon/digital dictation along with [employer]. Any transcriptional errors that result from this process are unintentional.  Drue Second, NP 08/06/2015   This was a shared visit with Drue Second. Mr. Pitstick was interviewed and examined. The sacrum pain is most likely a benign finding. He will be referred for restaging CT scans and return for an office visit next week.  Julieanne Manson, M.D.

## 2015-08-06 NOTE — Assessment & Plan Note (Signed)
Patient completed his last cycle of Xeloda oral therapy this past Wednesday, 07/31/2015.  He is scheduled to begin his next cycle of Xeloda this coming Wednesday, 08/07/2015.  Patient states that he is tolerating the oral Xeloda therapy fairly well.  He was experiencing some mild hand/foot syndrome; but states that that has greatly improved recently.  He continues with some very mild neuropathy to all of his extremities.  He does note some recent onset of very low back/sacral discomfort within the past few days.  He denies any known injury or trauma to that area.  Patient states that this back pain is similar to the back pain.  He had when he was first diagnosed with his rectal cancer.  Patient did admit to feeling slightly anxious and was occasionally tearful during the exam today.  He states that his mother is also undergoing cancer treatment here at the cancer center; and sometimes he feels overwhelmed.  He continues trying to work a full-time job as well.  Patient is also requesting a refill of his Xanax today.  On exam.-Patient appears well.  Please see further notes for details of exam.  Blood counts obtained today revealed a WBC of 6.5, ANC 3.9, hemoglobin 16.2, platelet count 149.  Will schedule a restaging CT with contrast of the chest/abdomen/pelvis for 08/09/2015 afternoon.  Patient will return on 08/13/2015 for a 9 AM appointment with Dr. Benay Spice to review scan results.

## 2015-08-06 NOTE — Assessment & Plan Note (Signed)
Patient continues with some mild chemotherapy therapy-induced neuropathy to all of his extremities.  He states that it is stable at present.  Will continue to monitor.

## 2015-08-09 ENCOUNTER — Other Ambulatory Visit: Payer: Self-pay | Admitting: Nurse Practitioner

## 2015-08-09 ENCOUNTER — Ambulatory Visit (HOSPITAL_COMMUNITY)
Admission: RE | Admit: 2015-08-09 | Discharge: 2015-08-09 | Disposition: A | Discharge status: Home / Self Care | Payer: 59 | Source: Ambulatory Visit | Attending: Nurse Practitioner | Admitting: Nurse Practitioner

## 2015-08-09 ENCOUNTER — Encounter (HOSPITAL_COMMUNITY): Payer: Self-pay

## 2015-08-09 ENCOUNTER — Telehealth: Payer: Self-pay | Admitting: *Deleted

## 2015-08-09 DIAGNOSIS — I700 Atherosclerosis of aorta: Secondary | ICD-10-CM | POA: Insufficient documentation

## 2015-08-09 DIAGNOSIS — Z933 Colostomy status: Secondary | ICD-10-CM | POA: Insufficient documentation

## 2015-08-09 DIAGNOSIS — K5730 Diverticulosis of large intestine without perforation or abscess without bleeding: Secondary | ICD-10-CM | POA: Diagnosis not present

## 2015-08-09 DIAGNOSIS — Z936 Other artificial openings of urinary tract status: Secondary | ICD-10-CM | POA: Insufficient documentation

## 2015-08-09 DIAGNOSIS — I2510 Atherosclerotic heart disease of native coronary artery without angina pectoris: Secondary | ICD-10-CM | POA: Diagnosis not present

## 2015-08-09 DIAGNOSIS — R590 Localized enlarged lymph nodes: Secondary | ICD-10-CM | POA: Diagnosis not present

## 2015-08-09 DIAGNOSIS — K760 Fatty (change of) liver, not elsewhere classified: Secondary | ICD-10-CM | POA: Insufficient documentation

## 2015-08-09 DIAGNOSIS — C20 Malignant neoplasm of rectum: Secondary | ICD-10-CM | POA: Insufficient documentation

## 2015-08-09 DIAGNOSIS — N433 Hydrocele, unspecified: Secondary | ICD-10-CM | POA: Insufficient documentation

## 2015-08-09 MED ORDER — IOHEXOL 300 MG/ML  SOLN
100.0000 mL | Freq: Once | INTRAMUSCULAR | Status: AC | PRN
  Administered 2015-08-09: 100 mL via INTRAVENOUS

## 2015-08-09 NOTE — Telephone Encounter (Signed)
Per Dr. Benay Spice; notified pt that CT show no evidence of progressive cancer, f/u as scheduled.  Pt verbalized understanding and expressed appreciation.

## 2015-08-09 NOTE — Telephone Encounter (Signed)
-----   Message from Ladell Pier, MD sent at 08/09/2015  4:08 PM EST ----- Please call patient, CTs show now evidence of progressive cancer, f/u as scheduled

## 2015-08-13 ENCOUNTER — Ambulatory Visit (HOSPITAL_BASED_OUTPATIENT_CLINIC_OR_DEPARTMENT_OTHER): Payer: 59 | Admitting: Oncology

## 2015-08-13 ENCOUNTER — Telehealth: Payer: Self-pay | Admitting: Oncology

## 2015-08-13 ENCOUNTER — Encounter: Payer: Self-pay | Admitting: *Deleted

## 2015-08-13 VITALS — BP 149/83 | HR 57 | Temp 98.2°F | Resp 18 | Ht 72.0 in | Wt 238.0 lb

## 2015-08-13 DIAGNOSIS — C20 Malignant neoplasm of rectum: Secondary | ICD-10-CM

## 2015-08-13 DIAGNOSIS — C7972 Secondary malignant neoplasm of left adrenal gland: Secondary | ICD-10-CM | POA: Diagnosis not present

## 2015-08-13 DIAGNOSIS — C7800 Secondary malignant neoplasm of unspecified lung: Secondary | ICD-10-CM

## 2015-08-13 DIAGNOSIS — N448 Other noninflammatory disorders of the testis: Secondary | ICD-10-CM

## 2015-08-13 DIAGNOSIS — Z23 Encounter for immunization: Secondary | ICD-10-CM | POA: Diagnosis not present

## 2015-08-13 DIAGNOSIS — D509 Iron deficiency anemia, unspecified: Secondary | ICD-10-CM | POA: Diagnosis not present

## 2015-08-13 MED ORDER — INFLUENZA VAC SPLIT QUAD 0.5 ML IM SUSY
0.5000 mL | PREFILLED_SYRINGE | Freq: Once | INTRAMUSCULAR | Status: AC
  Administered 2015-08-13: 0.5 mL via INTRAMUSCULAR
  Filled 2015-08-13: qty 0.5

## 2015-08-13 MED ORDER — CAPECITABINE 500 MG PO TABS: ORAL_TABLET | ORAL | Status: DC

## 2015-08-13 NOTE — Telephone Encounter (Signed)
per pof to sch pt appt-gave pt copy of avs °

## 2015-08-13 NOTE — Progress Notes (Signed)
Oncology Nurse Navigator Documentation  Oncology Nurse Navigator Flowsheets 08/13/2015  Navigator Encounter Type 6 month  Patient Visit Type Medonc  Treatment Phase Treatment--Xeloda   Barriers/Navigation Needs No barriers at this time  Interventions Other-suggested he have flu vaccine today  Time Spent with Patient 15  Xeloda co pay currently $5.00. Reports he will have new insurance coverage in 2017.

## 2015-08-13 NOTE — Progress Notes (Signed)
[place] OFFICE PROGRESS NOTE   Diagnosis: Rectal cancer  INTERVAL HISTORY:   Austin Robbins returns as scheduled. The lower back discomfort has resolved. He had a gout flare in the left leg and ankle last week. This has improved. He noted enlargement of the left testicle beginning last week. No pain. He is working. The current cycle of Xeloda has not been delivered.  Objective:  Vital signs in last 24 hours:  Blood pressure 149/83, pulse 57, temperature 98.2 F (36.8 C), temperature source Oral, resp. rate 18, height 6' (1.829 m), weight 238 lb (107.956 kg), SpO2 97 %.    HEENT: No thrush or ulcers Lymphatics: No cervical, supra-clavicular, axillary, or inguinal nodes Resp: Lungs clear bilaterally Cardio: Regular rate and rhythm GI: No hepatomegaly, right lower quadrant urostomy, left lower quadrant colostomy Vascular: Trace edema at the left greater than right lower leg. No erythema. GU: Diffuse soft enlargement of the left testicle without a discrete mass  Skin: Hyperpigmentation and thickening of the hands with mild erythema and dryness   Portacath/PICC-without erythema  Lab Results:  Lab Results  Component Value Date   WBC 6.5 08/05/2015   HGB 16.2 08/05/2015   HCT 45.3 08/05/2015   MCV 97.0 08/05/2015   PLT 149 08/05/2015   NEUTROABS 3.9 08/05/2015      Lab Results  Component Value Date   CEA 2.4 08/05/2015    Imaging:  CTs of the chest, abdomen, and pelvis on 08/09/2015-no evidence of progressive disease, unchanged mildly enlarged inguinal, right hilar, and subcarinal nodes.   CTs reviewed Medications: I have reviewed the patient's current medications.  Assessment/Plan: 1. Clinical stage IV (M4W,O0H,O1) adenocarcinoma of the rectum with tumor directly extending to the bladder/prostate and CT scan evidence of metastatic chest adenopathy  Positive K-ras mutation.   Normal mismatch repair protein expression. Microsatellite stable    Status post low anterior resection and end colostomy with a cystoprostatectomy and colon conduit urinary diversion 11/01/2013.   Staging PET scan with hypermetabolic pelvic nodes, mediastinal nodes, left adrenal metastasis, and left upper lobe nodule.   Initiation of FOLFOX 12/25/2013.   Restaging CT 03/16/2014 after 6 cycles of FOLFOX confirmed improvement in chest/pelvic lymphadenopathy, a left upper lobe nodule, and resolution of a left adrenal nodule   Oxaliplatin deleted from cycle 8 FOLFOX 04/02/2014 secondary to neuropathy and thrombocytopenia.   Oxaliplatin held with cycle 9 FOLFOX 04/16/2014 due to neuropathy.   Cycle 10 FOLFOX 04/30/2014.   Cycle 11 FOLFOX 05/15/2014.   Cycle 12 FOLFOX 05/28/2014. Oxaliplatin held due to increased neuropathy.   Restaging CT evaluation 06/08/2014 with stable mediastinal and hilar adenopathy. Stable borderline left iliac lymph node. No new or progressive disease identified within the chest, abdomen or pelvis.   "Maintenance" 5-fluorouracil beginning 06/11/2014.  Restaging CT evaluation 10/10/2014 showed similar mild mediastinal and right hilar adenopathy. No visible recurrence of the prior left upper lobe metastatic lesion or the left adrenal metastatic lesion. No new lesions noted.  Maintenance Xeloda on a 7 day on/7 day off schedule beginning 10/25/2014  Restaging CTs 03/04/2015 with stable mediastinal lymph nodes and no evidence of metastatic disease  Xeloda continued  Xeloda dose reduced beginning 04/15/2015 due to progressive hand-foot syndrome.  Xeloda placed on hold 05/15/2015 due to continued hand/foot pain.  Xeloda resumed 06/10/2015.  Restaging CTs 08/09/2015 with no evidence of disease progression 2. Microcytic anemia-likely iron deficiency, improved.  3. Gout. 4. Port-A-Cath placement 12/18/2013.  5. Anxiety. He takes Xanax as needed. 6. History  of left knee pain and swelling. 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 Trial of Cymbalta initiated 07/09/2014. 8. History of Thrombocytopenia secondary to chemotherapy. 9. Skin rash 04/30/2014-resolved with doxycycline 10. Chronic edema left lower leg/ankle. Negative Doppler 09/17/2014 11. Hand-foot syndrome secondary to Xeloda. Improved. 12. Enlargement of the left testicle 08/13/2015-he will schedule an appointment with Dr. Tresa Moore   Disposition:  Mr. Sherburn remains in clinical remission from rectal cancer. The plan is to continue maintenance Xeloda. He will begin the next cycle on 08/16/2015. He will return for an office and lab visit on 09/10/2014.  He will schedule an appointment with Dr. Tresa Moore to evaluate the left testicle enlargement.  He received an influenza vaccine today.  [name], MD  08/13/2015  9:17 AM

## 2015-09-05 ENCOUNTER — Other Ambulatory Visit: Payer: Self-pay | Admitting: Nurse Practitioner

## 2015-09-11 ENCOUNTER — Ambulatory Visit: Payer: BLUE CROSS/BLUE SHIELD

## 2015-09-11 ENCOUNTER — Other Ambulatory Visit: Payer: Self-pay | Admitting: Oncology

## 2015-09-11 ENCOUNTER — Ambulatory Visit (HOSPITAL_BASED_OUTPATIENT_CLINIC_OR_DEPARTMENT_OTHER): Payer: BLUE CROSS/BLUE SHIELD | Admitting: Nurse Practitioner

## 2015-09-11 ENCOUNTER — Encounter: Payer: Self-pay | Admitting: Nurse Practitioner

## 2015-09-11 ENCOUNTER — Telehealth: Payer: Self-pay | Admitting: *Deleted

## 2015-09-11 ENCOUNTER — Other Ambulatory Visit (HOSPITAL_BASED_OUTPATIENT_CLINIC_OR_DEPARTMENT_OTHER): Payer: BLUE CROSS/BLUE SHIELD

## 2015-09-11 VITALS — BP 154/88 | HR 59 | Temp 98.6°F | Resp 18 | Ht 72.0 in | Wt 237.3 lb

## 2015-09-11 DIAGNOSIS — C20 Malignant neoplasm of rectum: Secondary | ICD-10-CM | POA: Diagnosis not present

## 2015-09-11 DIAGNOSIS — N5089 Other specified disorders of the male genital organs: Secondary | ICD-10-CM | POA: Diagnosis not present

## 2015-09-11 DIAGNOSIS — M109 Gout, unspecified: Secondary | ICD-10-CM

## 2015-09-11 DIAGNOSIS — R609 Edema, unspecified: Secondary | ICD-10-CM | POA: Insufficient documentation

## 2015-09-11 DIAGNOSIS — Z23 Encounter for immunization: Secondary | ICD-10-CM

## 2015-09-11 DIAGNOSIS — R21 Rash and other nonspecific skin eruption: Secondary | ICD-10-CM | POA: Diagnosis not present

## 2015-09-11 DIAGNOSIS — Z95828 Presence of other vascular implants and grafts: Secondary | ICD-10-CM

## 2015-09-11 DIAGNOSIS — M1000 Idiopathic gout, unspecified site: Secondary | ICD-10-CM

## 2015-09-11 LAB — COMPREHENSIVE METABOLIC PANEL
ALK PHOS: 81 U/L (ref 40–150)
ALT: 21 U/L (ref 0–55)
ANION GAP: 9 meq/L (ref 3–11)
AST: 20 U/L (ref 5–34)
Albumin: 3.8 g/dL (ref 3.5–5.0)
BILIRUBIN TOTAL: 0.88 mg/dL (ref 0.20–1.20)
BUN: 12.3 mg/dL (ref 7.0–26.0)
CALCIUM: 9 mg/dL (ref 8.4–10.4)
CO2: 24 meq/L (ref 22–29)
CREATININE: 0.8 mg/dL (ref 0.7–1.3)
Chloride: 108 mEq/L (ref 98–109)
EGFR: >90 mL/min/{1.73_m2} (ref >90)
Glucose: 106 mg/dl (ref 70–140)
Potassium: 4.2 mEq/L (ref 3.5–5.1)
Sodium: 140 mEq/L (ref 136–145)
TOTAL PROTEIN: 7.1 g/dL (ref 6.4–8.3)

## 2015-09-11 LAB — CBC WITH DIFFERENTIAL/PLATELET
BASO%: 0.6 % (ref 0.0–2.0)
Basophils Absolute: 0 10*3/uL (ref 0.0–0.1)
EOS ABS: 0.3 10*3/uL (ref 0.0–0.5)
EOS%: 3.5 % (ref 0.0–7.0)
HEMATOCRIT: 45.8 % (ref 38.4–49.9)
HGB: 15.7 g/dL (ref 13.0–17.1)
LYMPH#: 1.7 10*3/uL (ref 0.9–3.3)
LYMPH%: 23.5 % (ref 14.0–49.0)
MCH: 33.3 pg (ref 27.2–33.4)
MCHC: 34.2 g/dL (ref 32.0–36.0)
MCV: 97.3 fL (ref 79.3–98.0)
MONO#: 0.7 10*3/uL (ref 0.1–0.9)
MONO%: 9.5 % (ref 0.0–14.0)
NEUT%: 62.9 % (ref 39.0–75.0)
NEUTROS ABS: 4.6 10*3/uL (ref 1.5–6.5)
PLATELETS: 175 10*3/uL (ref 140–400)
RBC: 4.71 10*6/uL (ref 4.20–5.82)
RDW: 15.2 % — ABNORMAL HIGH (ref 11.0–14.6)
WBC: 7.3 10*3/uL (ref 4.0–10.3)

## 2015-09-11 LAB — CEA: CEA: 2.5 ng/mL (ref 0.0–5.0)

## 2015-09-11 MED ORDER — CAPECITABINE 500 MG PO TABS: ORAL_TABLET | ORAL | Status: DC

## 2015-09-11 MED ORDER — INDOMETHACIN 50 MG PO CAPS: 50.0000 mg | ORAL_CAPSULE | Freq: Three times a day (TID) | ORAL | Status: DC | PRN

## 2015-09-11 MED ORDER — ALPRAZOLAM 1 MG PO TABS: 1.0000 mg | ORAL_TABLET | Freq: Three times a day (TID) | ORAL | Status: DC | PRN

## 2015-09-11 MED ORDER — HEPARIN SOD (PORK) LOCK FLUSH 100 UNIT/ML IV SOLN
500.0000 [IU] | Freq: Once | INTRAVENOUS | Status: AC
  Administered 2015-09-11: 500 [IU] via INTRAVENOUS
  Filled 2015-09-11: qty 5

## 2015-09-11 MED ORDER — SODIUM CHLORIDE 0.9 % IJ SOLN
10.0000 mL | INTRAMUSCULAR | Status: DC | PRN
  Administered 2015-09-11: 10 mL via INTRAVENOUS
  Filled 2015-09-11: qty 10

## 2015-09-11 NOTE — Telephone Encounter (Signed)
TC to [facility] to make appt for testicular pain and swelling. Appt made for 1/5 at 9:30 with Dr. Tresa Moore. Called pt to inform appt time. Pt declined appt due to work schedule. Pt states he did not have ability to give a new time or date that would better fit his schedule. This nurse gave pt [facility] telephone number to call to reschedule.   Appt cancelled at [facility].

## 2015-09-11 NOTE — Assessment & Plan Note (Signed)
Patient in for follow-up of his rectal cancer.  He completed his last cycle of Xeloda oral therapy on 09/08/2014 evening.  Patient states that he has developed a rash to his right anterior leg.  Within the past week or so.  He also continues with some left testicular swelling; but denies any testicular pain or masses.  He has not followed up with Dr. Tresa Moore at [facility] urology for further evaluation as of yet.  He also reports some intermittent issues with chronic gout as well.  He states that about was in his left lower extremity; but now has moved to his right knee.  He is requesting a refill of both his indomethacin and Xanax today.  Labs obtained today were essentially normal; and vital signs were stable.  Patient was afebrile.  Patient states that he's been having some difficulty receiving his Xeloda refills via home delivery for the past few cycles.  He states he has new insurance; is interested in obtaining his Xeloda locally if at all possible.  Patient will be scheduled to initiate his next cycle of Xeloda on 09/17/2015.  Advice patient would speak to our [place] oral chemotherapy pharmacist to see if he can help with either expediting the home delivery of his Xeloda; or obtaining the Xeloda refills locally.  Also, will attempt to arrange an appointment with Dr. Tresa Moore at [facility] urology regarding his testicular swelling.  Patient will be due to return for labs and a follow-up visit with Dr. Benay Spice on 10/09/2015.

## 2015-09-11 NOTE — Progress Notes (Signed)
SYMPTOM MANAGEMENT CLINIC   HPI: Austin Robbins 60 y.o. male diagnosed with rectal cancer.  Currently undergoing Xeloda oral therapy.   Patient in for follow-up of his rectal cancer.  He completed his last cycle of Xeloda oral therapy on 09/08/2014 evening.  Patient states that he has developed a rash to his right anterior leg.  Within the past week or so.  He also continues with some left testicular swelling; but denies any testicular pain or masses.  He has not followed up with Dr. Tresa Moore at [facility] urology for further evaluation as of yet.  He also reports some intermittent issues with chronic gout as well.  He states that about was in his left lower extremity; but now has moved to his right knee.  He is requesting a refill of both his indomethacin and Xanax today.  Labs obtained today were essentially normal; and vital signs were stable.  Patient was afebrile.  Patient states that he's been having some difficulty receiving his Xeloda refills via home delivery for the past few cycles.  He states he has new insurance; is interested in obtaining his Xeloda locally if at all possible.  Patient will be scheduled to initiate his next cycle of Xeloda on 09/17/2015.  Advice patient would speak to our [place] oral chemotherapy pharmacist to see if he can help with either expediting the home delivery of his Xeloda; or obtaining the Xeloda refills locally.  Also, will attempt to arrange an appointment with Dr. Tresa Moore at [facility] urology regarding his testicular swelling.  Patient will be due to return for labs and a follow-up visit with Dr. Benay Spice on 10/09/2015.  HPI  ROS  Past Medical History  Diagnosis Date  . H. pylori infection 08/07/2013  . Anemia   . Gastritis   . Panic attacks   . Personal history of colonic polyps - adenoma 10/25/2013  . Allergy   . S/P colostomy ([place])   . Adenocarcinoma of rectum [facility]) 10/25/2013    10/25/2013 colonoscopy  . Colon cancer ([place]) 11/03/13     Past Surgical History  Procedure Laterality Date  . Tonsillectomy    . Low anterior bowel resection  11/04/2013  . Cystectomy w/ ureterosigmoidostomy  11/04/2013  . Bowel resection N/A 11/03/2013    Procedure: OPEN ENCOLOSTOMY /COLON RESECTION/COLOSTOMY;  Surgeon: Leighton Ruff, MD;  Location: [facility];  Service: General;  Laterality: N/A;  . Application of wound vac N/A 11/03/2013    Procedure: APPLICATION OF WOUND VAC;  Surgeon: Leighton Ruff, MD;  Location: [facility];  Service: General;  Laterality: N/A;  . Cystoscopy with ureteroscopy and stent placement Bilateral 11/03/2013    Procedure: CYSTOSCOPY WITH RIGHT RETROGRADE STENT PLACEMENT , bladder biopsy,TOTAL PROSTATE WITH ENBLOCK CYSTECTOMY AND PROSTATECTOMY/ COLON CONDUIT URINARY DIVERSION/ INSERTION BILATERAL STENTS/ ILEOSTOMY;  Surgeon: Alexis Frock, MD;  Location: [facility];  Service: Urology;  Laterality: Bilateral;  bladder biopsy  . Portacath placement Left 12/18/2013    Procedure: INSERTION PORT-A-CATH;  Surgeon: Leighton Ruff, MD;  Location: [address];  Service: General;  Laterality: Left;    has Personal history of colonic polyps - adenoma; Pyuria; Anemia of chronic disease; Rectal adenocarcinoma with perforation & invasion into bladder s/p LAR/cystectomy/colon conduit 11/04/2013; Panic attacks; Gastritis; Chemotherapy-induced neuropathy ([place]); Rash; Peripheral edema; Testicular swelling, left; and Gout on his problem list.    is allergic to codeine.    Medication List       This list is accurate as of: 09/11/15 12:58 PM.  Always use your most recent med list.               acetaminophen 500 MG tablet  Commonly known as:  TYLENOL  Take 1,000 mg by mouth every 6 (six) hours as needed (pain).     ALPRAZolam 1 MG tablet  Commonly known as:  XANAX  Take 1 tablet (1 mg total) by mouth every 8 (eight) hours as needed for anxiety.     capecitabine 500 MG tablet  Commonly known as:  XELODA  Take 3 tablets (1539m)  in AM, Take 2 tablets (1000 mg) in PM (2500 mg total daily) Take days 1-7 and 15-21 totaling 14 days a month.     indomethacin 50 MG capsule  Commonly known as:  INDOCIN  Take 1 capsule (50 mg total) by mouth 3 (three) times daily as needed.     lidocaine-prilocaine cream  Commonly known as:  EMLA  Apply 1 application topically as needed. Apply 1-2 hours prior to stick and cover with plastic wrap     oxyCODONE-acetaminophen 5-325 MG tablet  Commonly known as:  PERCOCET/ROXICET  Take 1 tablet by mouth every 6 (six) hours as needed for severe pain.     prochlorperazine 10 MG tablet  Commonly known as:  COMPAZINE  TAKE ONE TABLET BY MOUTH EVERY 6 HOURS AS NEEDED FOR NAUSEA OR  VOMITING         PHYSICAL EXAMINATION  Oncology Vitals 09/11/2015 08/13/2015  Height 183 cm 183 cm  Weight 107.639 kg 107.956 kg  Weight (lbs) 237 lbs 5 oz 238 lbs  BMI (kg/m2) 32.18 kg/m2 32.28 kg/m2  Temp 98.6 98.2  Pulse 59 57  Resp 18 18  SpO2 98 97  BSA (m2) 2.34 m2 2.34 m2   BP Readings from Last 2 Encounters:  09/11/15 154/88  08/13/15 149/83    Physical Exam  Constitutional: He is oriented to person, place, and time and well-developed, well-nourished, and in no distress.  HENT:  Head: Normocephalic and atraumatic.  Eyes: Conjunctivae and EOM are normal. Pupils are equal, round, and reactive to light. Right eye exhibits no discharge. Left eye exhibits no discharge. No scleral icterus.  Neck: Normal range of motion. Neck supple.  Pulmonary/Chest: Effort normal. No respiratory distress.  Genitourinary:  No testicular exam performed today.  Musculoskeletal: Normal range of motion. He exhibits edema. He exhibits no tenderness.  +1 edema to bilateral ankles.  Neurological: He is alert and oriented to person, place, and time. Gait normal.  Skin: Skin is warm and dry. Rash noted. No erythema. No pallor.  Raised, red rash to right anterior leg.  No evidence of infection.  Psychiatric: Affect  normal.  Nursing note and vitals reviewed.   LABORATORY DATA:. Appointment on 09/11/2015  Component Date Value Ref Range Status  . WBC 09/11/2015 7.3  4.0 - 10.3 10e3/uL Final  . NEUT# 09/11/2015 4.6  1.5 - 6.5 10e3/uL Final  . HGB 09/11/2015 15.7  13.0 - 17.1 g/dL Final  . HCT 09/11/2015 45.8  38.4 - 49.9 % Final  . Platelets 09/11/2015 175  140 - 400 10e3/uL Final  . MCV 09/11/2015 97.3  79.3 - 98.0 fL Final  . MCH 09/11/2015 33.3  27.2 - 33.4 pg Final  . MCHC 09/11/2015 34.2  32.0 - 36.0 g/dL Final  . RBC 09/11/2015 4.71  4.20 - 5.82 10e6/uL Final  . RDW 09/11/2015 15.2* 11.0 - 14.6 % Final  . lymph# 09/11/2015 1.7  0.9 - 3.3 10e3/uL Final  .  MONO# 09/11/2015 0.7  0.1 - 0.9 10e3/uL Final  . Eosinophils Absolute 09/11/2015 0.3  0.0 - 0.5 10e3/uL Final  . Basophils Absolute 09/11/2015 0.0  0.0 - 0.1 10e3/uL Final  . NEUT% 09/11/2015 62.9  39.0 - 75.0 % Final  . LYMPH% 09/11/2015 23.5  14.0 - 49.0 % Final  . MONO% 09/11/2015 9.5  0.0 - 14.0 % Final  . EOS% 09/11/2015 3.5  0.0 - 7.0 % Final  . BASO% 09/11/2015 0.6  0.0 - 2.0 % Final  . Sodium 09/11/2015 140  136 - 145 mEq/L Final  . Potassium 09/11/2015 4.2  3.5 - 5.1 mEq/L Final  . Chloride 09/11/2015 108  98 - 109 mEq/L Final  . CO2 09/11/2015 24  22 - 29 mEq/L Final  . Glucose 09/11/2015 106  70 - 140 mg/dl Final   Glucose reference range is for nonfasting patients. Fasting glucose reference range is 70- 100.  Marland Kitchen BUN 09/11/2015 12.3  7.0 - 26.0 mg/dL Final  . Creatinine 09/11/2015 0.8  0.7 - 1.3 mg/dL Final  . Total Bilirubin 09/11/2015 0.88  0.20 - 1.20 mg/dL Final  . Alkaline Phosphatase 09/11/2015 81  40 - 150 U/L Final  . AST 09/11/2015 20  5 - 34 U/L Final  . ALT 09/11/2015 21  0 - 55 U/L Final  . Total Protein 09/11/2015 7.1  6.4 - 8.3 g/dL Final  . Albumin 09/11/2015 3.8  3.5 - 5.0 g/dL Final  . Calcium 09/11/2015 9.0  8.4 - 10.4 mg/dL Final  . Anion Gap 09/11/2015 9  3 - 11 mEq/L Final  . EGFR 09/11/2015 >90  >90  ml/min/1.73 m2 Final   eGFR is calculated using the CKD-EPI Creatinine Equation (2009)   Right leg:       RADIOGRAPHIC STUDIES: No results found.  ASSESSMENT/PLAN:    Rectal adenocarcinoma with perforation & invasion into bladder s/p LAR/cystectomy/colon conduit 11/04/2013 Patient in for follow-up of his rectal cancer.  He completed his last cycle of Xeloda oral therapy on 09/08/2014 evening.  Patient states that he has developed a rash to his right anterior leg.  Within the past week or so.  He also continues with some left testicular swelling; but denies any testicular pain or masses.  He has not followed up with Dr. Tresa Moore at [facility] urology for further evaluation as of yet.  He also reports some intermittent issues with chronic gout as well.  He states that about was in his left lower extremity; but now has moved to his right knee.  He is requesting a refill of both his indomethacin and Xanax today.  Labs obtained today were essentially normal; and vital signs were stable.  Patient was afebrile.  Patient states that he's been having some difficulty receiving his Xeloda refills via home delivery for the past few cycles.  He states he has new insurance; is interested in obtaining his Xeloda locally if at all possible.  Patient will be scheduled to initiate his next cycle of Xeloda on 09/17/2015.  Advice patient would speak to our [place] oral chemotherapy pharmacist to see if he can help with either expediting the home delivery of his Xeloda; or obtaining the Xeloda refills locally.  Also, will attempt to arrange an appointment with Dr. Tresa Moore at [facility] urology regarding his testicular swelling.  Patient will be due to return for labs and a follow-up visit with Dr. Benay Spice on 10/09/2015.  Rash Patient is a developed a rash to his right anterior leg that is  only slightly pruritic.  Patient states that he has exact same rash to the exact same area several months ago as well.  He  denies any new medications, lotions, shampoos, or soaps.  He states that he has switched from wearing shorts to jeans; and is wondering if his jeans have irritated his right leg.  Exam today revealed a raised red rash to the anterior right leg only.  There is no evidence of infection to the site.  Gave patient printed instructions regarding Benadryl 25 mg every 6 hours and Pepcid 20 mg every 12 hours for treatment of the rash.  Patient was advised he may also try hydrocortisone cream to the rash.  Also, advised patient to let us know if rash continues or worsens.  Peripheral edema Patient has some very mild, +1 bilateral lower extremity edema at his ankles.  Testicular swelling, left Patient has an approximate 5 week history of left testicular swelling.  He denies any masses or tenderness to the testicle.  Patient is already a patient of Dr. Tresa Moore at [facility] urology.  Will attempt to arrange an appointment for urological follow-up as soon as possible.  Gout Patient has history of chronic gout to his bilateral lower extremities.  Patient states that the gout to his left leg has resolved; but he now has some initial phases of gout.  Tenderness to his right knee.  On exam.-There is no erythema, warmth, or edema to the right knee.  Patient was given a refill of his indomethacin per his request.  Patient stated understanding of all instructions; and was in agreement with this plan of care. The patient knows to call the clinic with any problems, questions or concerns.   Review/collaboration with Dr. Benay Spice regarding all aspects of patient's visit today.   Total time spent with patient was 40 minutes;  with greater than 75 percent of that time spent in face to face counseling regarding patient's symptoms,  and coordination of care and follow up.  Disclaimer:This dictation was prepared with Dragon/digital dictation along with [employer]. Any transcriptional errors that result from this  process are unintentional.  Drue Second, NP 09/11/2015

## 2015-09-11 NOTE — Assessment & Plan Note (Signed)
Patient has some very mild, +1 bilateral lower extremity edema at his ankles.

## 2015-09-11 NOTE — Assessment & Plan Note (Signed)
Patient has history of chronic gout to his bilateral lower extremities.  Patient states that the gout to his left leg has resolved; but he now has some initial phases of gout.  Tenderness to his right knee.  On exam.-There is no erythema, warmth, or edema to the right knee.  Patient was given a refill of his indomethacin per his request.

## 2015-09-11 NOTE — Assessment & Plan Note (Signed)
Patient has an approximate 5 week history of left testicular swelling.  He denies any masses or tenderness to the testicle.  Patient is already a patient of Dr. Tresa Moore at [facility] urology.  Will attempt to arrange an appointment for urological follow-up as soon as possible.

## 2015-09-11 NOTE — Assessment & Plan Note (Signed)
Patient is a developed a rash to his right anterior leg that is only slightly pruritic.  Patient states that he has exact same rash to the exact same area several months ago as well.  He denies any new medications, lotions, shampoos, or soaps.  He states that he has switched from wearing shorts to jeans; and is wondering if his jeans have irritated his right leg.  Exam today revealed a raised red rash to the anterior right leg only.  There is no evidence of infection to the site.  Gave patient printed instructions regarding Benadryl 25 mg every 6 hours and Pepcid 20 mg every 12 hours for treatment of the rash.  Patient was advised he may also try hydrocortisone cream to the rash.  Also, advised patient to let us know if rash continues or worsens.

## 2015-09-11 NOTE — Patient Instructions (Signed)

## 2015-09-12 ENCOUNTER — Telehealth: Payer: Self-pay | Admitting: Oncology

## 2015-09-12 NOTE — Telephone Encounter (Signed)
Spoke with patient re lab/fu 2/1.

## 2015-09-13 ENCOUNTER — Telehealth: Payer: Self-pay | Admitting: *Deleted

## 2015-09-13 NOTE — Telephone Encounter (Signed)
LM for rtn call- check on pt rash to r leg. Concern for cellulitis.

## 2015-09-18 ENCOUNTER — Other Ambulatory Visit: Payer: Self-pay | Admitting: *Deleted

## 2015-09-18 ENCOUNTER — Telehealth: Payer: Self-pay

## 2015-09-18 DIAGNOSIS — C20 Malignant neoplasm of rectum: Secondary | ICD-10-CM

## 2015-09-18 MED ORDER — CAPECITABINE 500 MG PO TABS: ORAL_TABLET | ORAL | Status: DC

## 2015-09-18 NOTE — Telephone Encounter (Signed)
Spoke with pt several times today re: Xeloda script / [facility].  Informed pt that [facility]out[facility] pharmacy ran script through again and it was rejected.  Pt states he did speak with [employer] rep and they said they could get Xeloda at another pharmacy; explained chemo meds have to go through a specialty pharmacy;  states [facility] said they have not delivered because "I didn't call them and they needed additional information because of the new year; also that we have not paid the deductible and we haven't gotten any statement re: payment"  Per Rutha Bouchard.D; notified pt that script will be sent to [facility].  Pt verbalized understanding and confirmed he will call office if/when he receives medicine. Pt verbalized appreciation for help.

## 2015-09-18 NOTE — Telephone Encounter (Signed)
Pt called stating he has not gotten his medication from [facility] yet. He was supposed to have started it a few days ago. He said he called his insurance and he can use another specialty pharmacy. S/w Gerald Stabs but he is working in [place] copy today. [facility] outpatient pharmacy has a note that he needs to use another pharmacy. Per Cyndee's note of 1/4 the pt has new insurance.

## 2015-09-26 ENCOUNTER — Encounter: Payer: Self-pay | Admitting: Oncology

## 2015-09-26 NOTE — Progress Notes (Signed)
Per [facility] capecitabine was shipped via [facility] 09/25/15

## 2015-10-09 ENCOUNTER — Other Ambulatory Visit (HOSPITAL_BASED_OUTPATIENT_CLINIC_OR_DEPARTMENT_OTHER): Payer: BLUE CROSS/BLUE SHIELD

## 2015-10-09 ENCOUNTER — Telehealth: Payer: Self-pay | Admitting: Oncology

## 2015-10-09 ENCOUNTER — Ambulatory Visit (HOSPITAL_BASED_OUTPATIENT_CLINIC_OR_DEPARTMENT_OTHER): Payer: BLUE CROSS/BLUE SHIELD | Admitting: Nurse Practitioner

## 2015-10-09 VITALS — BP 147/88 | HR 60 | Temp 98.3°F | Resp 19 | Ht 72.0 in | Wt 238.6 lb

## 2015-10-09 DIAGNOSIS — L539 Erythematous condition, unspecified: Secondary | ICD-10-CM | POA: Diagnosis not present

## 2015-10-09 DIAGNOSIS — D509 Iron deficiency anemia, unspecified: Secondary | ICD-10-CM | POA: Diagnosis not present

## 2015-10-09 DIAGNOSIS — R200 Anesthesia of skin: Secondary | ICD-10-CM

## 2015-10-09 DIAGNOSIS — C20 Malignant neoplasm of rectum: Secondary | ICD-10-CM | POA: Diagnosis not present

## 2015-10-09 LAB — CBC WITH DIFFERENTIAL/PLATELET
BASO%: 0.5 % (ref 0.0–2.0)
BASOS ABS: 0 10*3/uL (ref 0.0–0.1)
EOS ABS: 0.3 10*3/uL (ref 0.0–0.5)
EOS%: 4.5 % (ref 0.0–7.0)
HEMATOCRIT: 45.5 % (ref 38.4–49.9)
HEMOGLOBIN: 15.6 g/dL (ref 13.0–17.1)
LYMPH#: 2.1 10*3/uL (ref 0.9–3.3)
LYMPH%: 32 % (ref 14.0–49.0)
MCH: 33 pg (ref 27.2–33.4)
MCHC: 34.2 g/dL (ref 32.0–36.0)
MCV: 96.5 fL (ref 79.3–98.0)
MONO#: 0.6 10*3/uL (ref 0.1–0.9)
MONO%: 8.9 % (ref 0.0–14.0)
NEUT#: 3.5 10*3/uL (ref 1.5–6.5)
NEUT%: 54.1 % (ref 39.0–75.0)
PLATELETS: 185 10*3/uL (ref 140–400)
RBC: 4.72 10*6/uL (ref 4.20–5.82)
RDW: 14.7 % — AB (ref 11.0–14.6)
WBC: 6.6 10*3/uL (ref 4.0–10.3)

## 2015-10-09 LAB — COMPREHENSIVE METABOLIC PANEL
ALBUMIN: 3.7 g/dL (ref 3.5–5.0)
ALK PHOS: 92 U/L (ref 40–150)
ALT: 20 U/L (ref 0–55)
ANION GAP: 9 meq/L (ref 3–11)
AST: 18 U/L (ref 5–34)
BUN: 12.6 mg/dL (ref 7.0–26.0)
CALCIUM: 8.7 mg/dL (ref 8.4–10.4)
CHLORIDE: 111 meq/L — AB (ref 98–109)
CO2: 19 mEq/L — ABNORMAL LOW (ref 22–29)
Creatinine: 0.8 mg/dL (ref 0.7–1.3)
EGFR: >90 mL/min/{1.73_m2} (ref >90)
Glucose: 95 mg/dl (ref 70–140)
POTASSIUM: 4.3 meq/L (ref 3.5–5.1)
Sodium: 139 mEq/L (ref 136–145)
Total Bilirubin: 0.68 mg/dL (ref 0.20–1.20)
Total Protein: 6.9 g/dL (ref 6.4–8.3)

## 2015-10-09 MED ORDER — ALPRAZOLAM 1 MG PO TABS: 1.0000 mg | ORAL_TABLET | Freq: Three times a day (TID) | ORAL | Status: DC | PRN

## 2015-10-09 NOTE — Telephone Encounter (Signed)
per pof to sch pt appt-gave pt copy of avs °

## 2015-10-09 NOTE — Progress Notes (Signed)
[place] OFFICE PROGRESS NOTE   Diagnosis:   Rectal cancer  INTERVAL HISTORY:    Austin Robbins returns as scheduled. He continues Xeloda 1 week on/1 week off. He overall feels well. No nausea or vomiting. No mouth sores. No diarrhea. Stable mild erythema and dryness over the palms and soles. Persistent numbness in the toes. Good appetite.  Objective:  Vital signs in last 24 hours:  Blood pressure 147/88, pulse 60, temperature 98.3 F (36.8 C), temperature source Oral, resp. rate 19, height 6' (1.829 m), weight 238 lb 9.6 oz (108.228 kg), SpO2 99 %.    HEENT:  No thrush or ulcers. Resp:  Lungs clear bilaterally. Cardio:  Regular rate and rhythm. GI:  Abdomen soft and nontender. No hepatomegaly. Right lower quadrant urostomy, left lower quadrant colostomy. Vascular:  Trace edema at the lower legs bilaterally left greater than right. Skin:  Mild erythema and dryness over the palms. Port-A-Cath without erythema.    Lab Results:  Lab Results  Component Value Date   WBC 6.6 10/09/2015   HGB 15.6 10/09/2015   HCT 45.5 10/09/2015   MCV 96.5 10/09/2015   PLT 185 10/09/2015   NEUTROABS 3.5 10/09/2015    Imaging:  No results found.  Medications: I have reviewed the patient's current medications.  Assessment/Plan: 1. Clinical stage IV (O1L,X7W,I2) adenocarcinoma of the rectum with tumor directly extending to the bladder/prostate and CT scan evidence of metastatic chest adenopathy  Positive K-ras mutation.   Normal mismatch repair protein expression. Microsatellite stable   Status post low anterior resection and end colostomy with a cystoprostatectomy and colon conduit urinary diversion 11/01/2013.   Staging PET scan with hypermetabolic pelvic nodes, mediastinal nodes, left adrenal metastasis, and left upper lobe nodule.   Initiation of FOLFOX 12/25/2013.   Restaging CT 03/16/2014 after 6 cycles of FOLFOX confirmed improvement in chest/pelvic  lymphadenopathy, a left upper lobe nodule, and resolution of a left adrenal nodule   Oxaliplatin deleted from cycle 8 FOLFOX 04/02/2014 secondary to neuropathy and thrombocytopenia.   Oxaliplatin held with cycle 9 FOLFOX 04/16/2014 due to neuropathy.   Cycle 10 FOLFOX 04/30/2014.   Cycle 11 FOLFOX 05/15/2014.   Cycle 12 FOLFOX 05/28/2014. Oxaliplatin held due to increased neuropathy.   Restaging CT evaluation 06/08/2014 with stable mediastinal and hilar adenopathy. Stable borderline left iliac lymph node. No new or progressive disease identified within the chest, abdomen or pelvis.   "Maintenance" 5-fluorouracil beginning 06/11/2014.  Restaging CT evaluation 10/10/2014 showed similar mild mediastinal and right hilar adenopathy. No visible recurrence of the prior left upper lobe metastatic lesion or the left adrenal metastatic lesion. No new lesions noted.  Maintenance Xeloda on a 7 day on/7 day off schedule beginning 10/25/2014  Restaging CTs 03/04/2015 with stable mediastinal lymph nodes and no evidence of metastatic disease  Xeloda continued  Xeloda dose reduced beginning 04/15/2015 due to progressive hand-foot syndrome.  Xeloda placed on hold 05/15/2015 due to continued hand/foot pain.  Xeloda resumed 06/10/2015.  Restaging CTs 08/09/2015 with no evidence of disease progression  Continuation of maintenance Xeloda 2. Microcytic anemia-likely iron deficiency, improved.  3. Gout. 4. Port-A-Cath placement 12/18/2013.  5. Anxiety. He takes Xanax as needed. 6. History of left knee pain and swelling. 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 Trial of Cymbalta initiated 07/09/2014. 8. History of Thrombocytopenia secondary to chemotherapy. 9. Skin rash 04/30/2014-resolved with doxycycline 10. Chronic edema left lower leg/ankle. Negative Doppler 09/17/2014 11.  Hand-foot syndrome secondary to Xeloda.  Improved. 12. Enlargement of the left testicle 08/13/2015.  He has been evaluated by urology.    Disposition: [name] appears stable. He remains in clinical remission from rectal cancer. Plan to continue maintenance Xeloda. He will return for a follow-up visit and labs in one month. He will contact the office in the interim with any problems.    [name] ANP/GNP-BC   10/09/2015  12:23 PM

## 2015-10-10 LAB — CEA (PARALLEL TESTING): CEA: 2.3 ng/mL (ref 0.0–5.0)

## 2015-10-10 LAB — CEA: CEA1: 3.5 ng/mL (ref 0.0–4.7)

## 2015-10-21 ENCOUNTER — Encounter: Payer: Self-pay | Admitting: Oncology

## 2015-10-21 NOTE — Progress Notes (Signed)
Per [facility] capecitabine was shipped via fedex 10/18/15

## 2015-11-06 ENCOUNTER — Ambulatory Visit: Payer: BLUE CROSS/BLUE SHIELD

## 2015-11-06 ENCOUNTER — Telehealth: Payer: Self-pay | Admitting: Oncology

## 2015-11-06 ENCOUNTER — Ambulatory Visit (HOSPITAL_BASED_OUTPATIENT_CLINIC_OR_DEPARTMENT_OTHER): Payer: BLUE CROSS/BLUE SHIELD | Admitting: Oncology

## 2015-11-06 ENCOUNTER — Other Ambulatory Visit (HOSPITAL_BASED_OUTPATIENT_CLINIC_OR_DEPARTMENT_OTHER): Payer: BLUE CROSS/BLUE SHIELD

## 2015-11-06 VITALS — BP 136/90 | HR 63 | Temp 98.2°F | Resp 18 | Ht 72.0 in | Wt 238.7 lb

## 2015-11-06 DIAGNOSIS — F419 Anxiety disorder, unspecified: Secondary | ICD-10-CM

## 2015-11-06 DIAGNOSIS — C7802 Secondary malignant neoplasm of left lung: Secondary | ICD-10-CM

## 2015-11-06 DIAGNOSIS — C7800 Secondary malignant neoplasm of unspecified lung: Secondary | ICD-10-CM

## 2015-11-06 DIAGNOSIS — C20 Malignant neoplasm of rectum: Secondary | ICD-10-CM

## 2015-11-06 DIAGNOSIS — C7972 Secondary malignant neoplasm of left adrenal gland: Secondary | ICD-10-CM

## 2015-11-06 DIAGNOSIS — D509 Iron deficiency anemia, unspecified: Secondary | ICD-10-CM

## 2015-11-06 DIAGNOSIS — Z95828 Presence of other vascular implants and grafts: Secondary | ICD-10-CM

## 2015-11-06 LAB — COMPREHENSIVE METABOLIC PANEL
ALBUMIN: 3.6 g/dL (ref 3.5–5.0)
ALT: 15 U/L (ref 0–55)
ANION GAP: 8 meq/L (ref 3–11)
AST: 16 U/L (ref 5–34)
Alkaline Phosphatase: 79 U/L (ref 40–150)
BUN: 13 mg/dL (ref 7.0–26.0)
CHLORIDE: 109 meq/L (ref 98–109)
CO2: 23 meq/L (ref 22–29)
Calcium: 8.6 mg/dL (ref 8.4–10.4)
Creatinine: 0.9 mg/dL (ref 0.7–1.3)
Glucose: 96 mg/dl (ref 70–140)
Potassium: 4.1 mEq/L (ref 3.5–5.1)
SODIUM: 140 meq/L (ref 136–145)
Total Bilirubin: 0.69 mg/dL (ref 0.20–1.20)
Total Protein: 6.6 g/dL (ref 6.4–8.3)

## 2015-11-06 LAB — CBC WITH DIFFERENTIAL/PLATELET
BASO%: 0.6 % (ref 0.0–2.0)
BASOS ABS: 0 10*3/uL (ref 0.0–0.1)
EOS%: 3.9 % (ref 0.0–7.0)
Eosinophils Absolute: 0.2 10*3/uL (ref 0.0–0.5)
HCT: 44 % (ref 38.4–49.9)
HEMOGLOBIN: 15.1 g/dL (ref 13.0–17.1)
LYMPH%: 28.7 % (ref 14.0–49.0)
MCH: 33.8 pg — AB (ref 27.2–33.4)
MCHC: 34.4 g/dL (ref 32.0–36.0)
MCV: 98.4 fL — ABNORMAL HIGH (ref 79.3–98.0)
MONO#: 0.5 10*3/uL (ref 0.1–0.9)
MONO%: 8.4 % (ref 0.0–14.0)
NEUT%: 58.4 % (ref 39.0–75.0)
NEUTROS ABS: 3.7 10*3/uL (ref 1.5–6.5)
Platelets: 162 10*3/uL (ref 140–400)
RBC: 4.47 10*6/uL (ref 4.20–5.82)
RDW: 16 % — AB (ref 11.0–14.6)
WBC: 6.3 10*3/uL (ref 4.0–10.3)
lymph#: 1.8 10*3/uL (ref 0.9–3.3)

## 2015-11-06 MED ORDER — HEPARIN SOD (PORK) LOCK FLUSH 100 UNIT/ML IV SOLN
500.0000 [IU] | Freq: Once | INTRAVENOUS | Status: AC
  Administered 2015-11-06: 500 [IU] via INTRAVENOUS
  Filled 2015-11-06: qty 5

## 2015-11-06 MED ORDER — SODIUM CHLORIDE 0.9% FLUSH
10.0000 mL | INTRAVENOUS | Status: DC | PRN
  Administered 2015-11-06: 10 mL via INTRAVENOUS
  Filled 2015-11-06: qty 10

## 2015-11-06 NOTE — Telephone Encounter (Signed)
appt made and avs printed °

## 2015-11-06 NOTE — Patient Instructions (Signed)

## 2015-11-06 NOTE — Progress Notes (Signed)
[place] OFFICE PROGRESS NOTE   Diagnosis: Rectal cancer  INTERVAL HISTORY:   Mr. Engelstad returns as scheduled. He continues every other week Xeloda. No mouth sores, diarrhea, or hand/foot pain. He continues to have numbness in the toes.  Objective:  Vital signs in last 24 hours:  Blood pressure 136/90, pulse 63, temperature 98.2 F (36.8 C), temperature source Oral, resp. rate 18, height 6' (1.829 m), weight 238 lb 11.2 oz (108.274 kg), SpO2 99 %.    HEENT: No thrush or ulcers Resp: Lungs clear bilaterally Cardio: Regular rate and rhythm GI: No hepatomegaly Vascular: No leg edema  Skin: Dryness at the palms and soles without skin breakdown   Portacath/PICC-without erythema  Lab Results:  Lab Results  Component Value Date   WBC 6.3 11/06/2015   HGB 15.1 11/06/2015   HCT 44.0 11/06/2015   MCV 98.4* 11/06/2015   PLT 162 11/06/2015   NEUTROABS 3.7 11/06/2015     Medications: I have reviewed the patient's current medications.  Assessment/Plan: 1. Clinical stage IV (D6U,Y4I,H4) adenocarcinoma of the rectum with tumor directly extending to the bladder/prostate and CT scan evidence of metastatic chest adenopathy Positive K-ras mutation.  Normal mismatch repair protein expression. Microsatellite stable  Status post low anterior resection and end colostomy with a cystoprostatectomy and colon conduit urinary diversion 11/01/2013.  Staging PET scan with hypermetabolic pelvic nodes, mediastinal nodes, left adrenal metastasis, and left upper lobe nodule.  Initiation of FOLFOX 12/25/2013.  Restaging CT 03/16/2014 after 6 cycles of FOLFOX confirmed improvement in chest/pelvic lymphadenopathy, a left upper lobe nodule, and resolution of a left adrenal nodule  Oxaliplatin deleted from cycle 8 FOLFOX 04/02/2014 secondary to neuropathy and thrombocytopenia.  Oxaliplatin held with cycle 9 FOLFOX 04/16/2014 due to neuropathy.  Cycle 10 FOLFOX 04/30/2014.  Cycle 11  FOLFOX 05/15/2014.  Cycle 12 FOLFOX 05/28/2014. Oxaliplatin held due to increased neuropathy.  Restaging CT evaluation 06/08/2014 with stable mediastinal and hilar adenopathy. Stable borderline left iliac lymph node. No new or progressive disease identified within the chest, abdomen or pelvis.  "Maintenance" 5-fluorouracil beginning 06/11/2014.  Restaging CT evaluation 10/10/2014 showed similar mild mediastinal and right hilar adenopathy. No visible recurrence of the prior left upper lobe metastatic lesion or the left adrenal metastatic lesion. No new lesions noted.  Maintenance Xeloda on a 7 day on/7 day off schedule beginning 10/25/2014  Restaging CTs 03/04/2015 with stable mediastinal lymph nodes and no evidence of metastatic disease  Xeloda continued  Xeloda dose reduced beginning 04/15/2015 due to progressive hand-foot syndrome.  Xeloda placed on hold 05/15/2015 due to continued hand/foot pain.  Xeloda resumed 06/10/2015.  Restaging CTs 08/09/2015 with no evidence of disease progression  Continuation of maintenance Xeloda 2. Microcytic anemia-likely iron deficiency, improved.  3. Gout. 4. Port-A-Cath placement 12/18/2013.  5. Anxiety. He takes Xanax as needed. 6. History of left knee pain and swelling. 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 Trial of Cymbalta initiated 07/09/2014. 8. History of Thrombocytopenia secondary to chemotherapy. 9. Skin rash 04/30/2014-resolved with doxycycline 10. Chronic edema left lower leg/ankle. Negative Doppler 09/17/2014 11. Hand-foot syndrome secondary to Xeloda. Improved. 12. Enlargement of the left testicle 08/13/2015. He has been evaluated by urology.      Disposition:  Austin Robbins appears stable. He will continue Xeloda on the current schedule. He will return for an office visit and Port-A-Cath flush in one month.  [name], MD  11/06/2015  10:32 AM

## 2015-11-07 LAB — CEA: CEA: 3.6 ng/mL (ref 0.0–4.7)

## 2015-11-07 LAB — CEA (PARALLEL TESTING): CEA: 2.5 ng/mL — ABNORMAL HIGH

## 2015-11-11 ENCOUNTER — Other Ambulatory Visit: Payer: Self-pay | Admitting: Nurse Practitioner

## 2015-11-11 ENCOUNTER — Other Ambulatory Visit: Payer: Self-pay | Admitting: *Deleted

## 2015-11-11 DIAGNOSIS — C20 Malignant neoplasm of rectum: Secondary | ICD-10-CM

## 2015-11-11 MED ORDER — ALPRAZOLAM 1 MG PO TABS: 1.0000 mg | ORAL_TABLET | Freq: Three times a day (TID) | ORAL | Status: DC | PRN

## 2015-11-15 ENCOUNTER — Other Ambulatory Visit: Payer: Self-pay | Admitting: *Deleted

## 2015-11-15 DIAGNOSIS — C20 Malignant neoplasm of rectum: Secondary | ICD-10-CM

## 2015-11-15 MED ORDER — CAPECITABINE 500 MG PO TABS: ORAL_TABLET | ORAL | Status: DC

## 2015-12-11 ENCOUNTER — Other Ambulatory Visit (HOSPITAL_BASED_OUTPATIENT_CLINIC_OR_DEPARTMENT_OTHER): Payer: BLUE CROSS/BLUE SHIELD

## 2015-12-11 ENCOUNTER — Telehealth: Payer: Self-pay | Admitting: Oncology

## 2015-12-11 ENCOUNTER — Encounter: Payer: Self-pay | Admitting: *Deleted

## 2015-12-11 ENCOUNTER — Other Ambulatory Visit: Payer: Self-pay | Admitting: Oncology

## 2015-12-11 ENCOUNTER — Ambulatory Visit (HOSPITAL_BASED_OUTPATIENT_CLINIC_OR_DEPARTMENT_OTHER): Payer: BLUE CROSS/BLUE SHIELD | Admitting: Nurse Practitioner

## 2015-12-11 ENCOUNTER — Ambulatory Visit: Payer: BLUE CROSS/BLUE SHIELD

## 2015-12-11 VITALS — BP 145/91 | HR 55 | Temp 97.8°F | Resp 20 | Ht 72.0 in | Wt 236.2 lb

## 2015-12-11 DIAGNOSIS — M79641 Pain in right hand: Secondary | ICD-10-CM

## 2015-12-11 DIAGNOSIS — D509 Iron deficiency anemia, unspecified: Secondary | ICD-10-CM | POA: Diagnosis not present

## 2015-12-11 DIAGNOSIS — M79642 Pain in left hand: Secondary | ICD-10-CM | POA: Diagnosis not present

## 2015-12-11 DIAGNOSIS — C20 Malignant neoplasm of rectum: Secondary | ICD-10-CM | POA: Diagnosis not present

## 2015-12-11 DIAGNOSIS — M79671 Pain in right foot: Secondary | ICD-10-CM

## 2015-12-11 DIAGNOSIS — M79672 Pain in left foot: Secondary | ICD-10-CM

## 2015-12-11 DIAGNOSIS — Z95828 Presence of other vascular implants and grafts: Secondary | ICD-10-CM

## 2015-12-11 LAB — CBC WITH DIFFERENTIAL/PLATELET
BASO%: 0.2 % (ref 0.0–2.0)
Basophils Absolute: 0 10*3/uL (ref 0.0–0.1)
EOS ABS: 0.2 10*3/uL (ref 0.0–0.5)
EOS%: 3.8 % (ref 0.0–7.0)
HCT: 43.9 % (ref 38.4–49.9)
HGB: 15.5 g/dL (ref 13.0–17.1)
LYMPH%: 32.9 % (ref 14.0–49.0)
MCH: 34.3 pg — ABNORMAL HIGH (ref 27.2–33.4)
MCHC: 35.3 g/dL (ref 32.0–36.0)
MCV: 97.1 fL (ref 79.3–98.0)
MONO#: 0.4 10*3/uL (ref 0.1–0.9)
MONO%: 6.7 % (ref 0.0–14.0)
NEUT%: 56.4 % (ref 39.0–75.0)
NEUTROS ABS: 3.4 10*3/uL (ref 1.5–6.5)
PLATELETS: 144 10*3/uL (ref 140–400)
RBC: 4.52 10*6/uL (ref 4.20–5.82)
RDW: 14.7 % — ABNORMAL HIGH (ref 11.0–14.6)
WBC: 6 10*3/uL (ref 4.0–10.3)
lymph#: 2 10*3/uL (ref 0.9–3.3)

## 2015-12-11 MED ORDER — ALPRAZOLAM 1 MG PO TABS: 1.0000 mg | ORAL_TABLET | Freq: Three times a day (TID) | ORAL | Status: DC | PRN

## 2015-12-11 MED ORDER — CAPECITABINE 500 MG PO TABS: ORAL_TABLET | ORAL | Status: DC

## 2015-12-11 MED ORDER — SODIUM CHLORIDE 0.9% FLUSH
10.0000 mL | INTRAVENOUS | Status: DC | PRN
  Administered 2015-12-11: 10 mL via INTRAVENOUS
  Filled 2015-12-11: qty 10

## 2015-12-11 MED ORDER — AMITRIPTYLINE HCL 25 MG PO TABS: 25.0000 mg | ORAL_TABLET | Freq: Every day | ORAL | Status: DC

## 2015-12-11 MED ORDER — HEPARIN SOD (PORK) LOCK FLUSH 100 UNIT/ML IV SOLN
500.0000 [IU] | Freq: Once | INTRAVENOUS | Status: AC
  Administered 2015-12-11: 500 [IU] via INTRAVENOUS
  Filled 2015-12-11: qty 5

## 2015-12-11 NOTE — Patient Instructions (Signed)
Amitriptyline tablets  What is this medicine?  AMITRIPTYLINE (a mee TRIP ti leen) is used to treat depression.  This medicine may be used for other purposes; ask your health care provider or pharmacist if you have questions.  What should I tell my health care provider before I take this medicine?  They need to know if you have any of these conditions:  -an alcohol problem  -asthma, difficulty breathing  -bipolar disorder or schizophrenia  -difficulty passing urine, prostate trouble  -glaucoma  -heart disease or previous heart attack  -liver disease  -over active thyroid  -seizures  -thoughts or plans of suicide, a previous suicide attempt, or family history of suicide attempt  -an unusual or allergic reaction to amitriptyline, other medicines, foods, dyes, or preservatives  -pregnant or trying to get pregnant  -breast-feeding  How should I use this medicine?  Take this medicine by mouth with a drink of water. Follow the directions on the prescription label. You can take the tablets with or without food. Take your medicine at regular intervals. Do not take it more often than directed. Do not stop taking this medicine suddenly except upon the advice of your doctor. Stopping this medicine too quickly may cause serious side effects or your condition may worsen.  A special MedGuide will be given to you by the pharmacist with each prescription and refill. Be sure to read this information carefully each time.  Talk to your pediatrician regarding the use of this medicine in children. Special care may be needed.  Overdosage: If you think you have taken too much of this medicine contact a poison control center or emergency room at once.  NOTE: This medicine is only for you. Do not share this medicine with others.  What if I miss a dose?  If you miss a dose, take it as soon as you can. If it is almost time for your next dose, take only that dose. Do not take double or extra doses.  What may interact with this medicine?  Do not  take this medicine with any of the following medications:  -arsenic trioxide  -certain medicines used to regulate abnormal heartbeat or to treat other heart conditions  -cisapride  -droperidol  -halofantrine  -linezolid  -MAOIs like Carbex, Eldepryl, Marplan, Nardil, and Parnate  -methylene blue  -other medicines for mental depression  -phenothiazines like perphenazine, thioridazine and chlorpromazine  -pimozide  -probucol  -procarbazine  -sparfloxacin  -St. John's Wort  -ziprasidone  This medicine may also interact with the following medications:  -atropine and related drugs like hyoscyamine, scopolamine, tolterodine and others  -barbiturate medicines for inducing sleep or treating seizures, like phenobarbital  -cimetidine  -disulfiram  -ethchlorvynol  -thyroid hormones such as levothyroxine  This list may not describe all possible interactions. Give your health care provider a list of all the medicines, herbs, non-prescription drugs, or dietary supplements you use. Also tell them if you smoke, drink alcohol, or use illegal drugs. Some items may interact with your medicine.  What should I watch for while using this medicine?  Tell your doctor if your symptoms do not get better or if they get worse. Visit your doctor or health care professional for regular checks on your progress. Because it may take several weeks to see the full effects of this medicine, it is important to continue your treatment as prescribed by your doctor.  Patients and their families should watch out for new or worsening thoughts of suicide or depression. Also watch out   for sudden changes in feelings such as feeling anxious, agitated, panicky, irritable, hostile, aggressive, impulsive, severely restless, overly excited and hyperactive, or not being able to sleep. If this happens, especially at the beginning of treatment or after a change in dose, call your health care professional.  You may get drowsy or dizzy. Do not drive, use machinery, or  do anything that needs mental alertness until you know how this medicine affects you. Do not stand or sit up quickly, especially if you are an older patient. This reduces the risk of dizzy or fainting spells. Alcohol may interfere with the effect of this medicine. Avoid alcoholic drinks.  Do not treat yourself for coughs, colds, or allergies without asking your doctor or health care professional for advice. Some ingredients can increase possible side effects.  Your mouth may get dry. Chewing sugarless gum or sucking hard candy, and drinking plenty of water will help. Contact your doctor if the problem does not go away or is severe.  This medicine may cause dry eyes and blurred vision. If you wear contact lenses you may feel some discomfort. Lubricating drops may help. See your eye doctor if the problem does not go away or is severe.  This medicine can cause constipation. Try to have a bowel movement at least every 2 to 3 days. If you do not have a bowel movement for 3 days, call your doctor or health care professional.  This medicine can make you more sensitive to the sun. Keep out of the sun. If you cannot avoid being in the sun, wear protective clothing and use sunscreen. Do not use sun lamps or tanning beds/booths.  What side effects may I notice from receiving this medicine?  Side effects that you should report to your doctor or health care professional as soon as possible:  -allergic reactions like skin rash, itching or hives, swelling of the face, lips, or tongue  -abnormal production of milk in females  -breast enlargement in both males and females  -breathing problems  -confusion, hallucinations  -fast, irregular heartbeat  -fever with increased sweating  -muscle stiffness, or spasms  -pain or difficulty passing urine, loss of bladder control  -seizures  -suicidal thoughts or other mood changes  -swelling of the testicles  -tingling, pain, or numbness in the feet or hands  -yellowing of the eyes or  skin  Side effects that usually do not require medical attention (report to your doctor or health care professional if they continue or are bothersome):  -change in sex drive or performance  -constipation or diarrhea  -nausea, vomiting  -weight gain or loss  This list may not describe all possible side effects. Call your doctor for medical advice about side effects. You may report side effects to FDA at 1-800-FDA-1088.  Where should I keep my medicine?  Keep out of the reach of children.  Store at room temperature between 20 and 25 degrees C (68 and 77 degrees F). Throw away any unused medicine after the expiration date.  NOTE: This sheet is a summary. It may not cover all possible information. If you have questions about this medicine, talk to your doctor, pharmacist, or health care provider.     © 2016, [employer]. (2012-01-11 13:50:32)

## 2015-12-11 NOTE — Progress Notes (Signed)
[place] OFFICE PROGRESS NOTE   Diagnosis:  Rectal cancer  INTERVAL HISTORY:   [place] Robbins returns as scheduled. He continues every other week Xeloda. He denies nausea/vomiting. No mouth sores. No diarrhea. He has bilateral foot and hand pain. The foot pain worsens as the day progresses.  Objective:  Vital signs in last 24 hours:  Blood pressure 145/91, pulse 55, temperature 97.8 F (36.6 C), temperature source Oral, resp. rate 20, height 6' (1.829 m), weight 236 lb 3.2 oz (107.14 kg), SpO2 100 %.    HEENT: No thrush or ulcers. Resp: Lungs clear bilaterally. Cardio: Regular rate and rhythm. GI: Abdomen soft and nontender. No hepatomegaly. Vascular: No leg edema. Skin: Palms and soles without erythema. No skin breakdown. Port-A-Cath without erythema.    Lab Results:  Lab Results  Component Value Date   WBC 6.0 12/11/2015   HGB 15.5 12/11/2015   HCT 43.9 12/11/2015   MCV 97.1 12/11/2015   PLT 144 12/11/2015   NEUTROABS 3.4 12/11/2015    Imaging:  No results found.  Medications: I have reviewed the patient's current medications.  Assessment/Plan: 1. Clinical stage IV (E5I,D7O,E4) adenocarcinoma of the rectum with tumor directly extending to the bladder/prostate and CT scan evidence of metastatic chest adenopathy  Positive K-ras mutation.   Normal mismatch repair protein expression. Microsatellite stable   Status post low anterior resection and end colostomy with a cystoprostatectomy and colon conduit urinary diversion 11/01/2013.   Staging PET scan with hypermetabolic pelvic nodes, mediastinal nodes, left adrenal metastasis, and left upper lobe nodule.   Initiation of FOLFOX 12/25/2013.   Restaging CT 03/16/2014 after 6 cycles of FOLFOX confirmed improvement in chest/pelvic lymphadenopathy, a left upper lobe nodule, and resolution of a left adrenal nodule   Oxaliplatin deleted from cycle 8 FOLFOX 04/02/2014 secondary to neuropathy and  thrombocytopenia.   Oxaliplatin held with cycle 9 FOLFOX 04/16/2014 due to neuropathy.   Cycle 10 FOLFOX 04/30/2014.   Cycle 11 FOLFOX 05/15/2014.   Cycle 12 FOLFOX 05/28/2014. Oxaliplatin held due to increased neuropathy.   Restaging CT evaluation 06/08/2014 with stable mediastinal and hilar adenopathy. Stable borderline left iliac lymph node. No new or progressive disease identified within the chest, abdomen or pelvis.   "Maintenance" 5-fluorouracil beginning 06/11/2014.   Restaging CT evaluation 10/10/2014 showed similar mild mediastinal and right hilar adenopathy. No visible recurrence of the prior left upper lobe metastatic lesion or the left adrenal metastatic lesion. No new lesions noted.   Maintenance Xeloda on a 7 day on/7 day off schedule beginning 10/25/2014   Restaging CTs 03/04/2015 with stable mediastinal lymph nodes and no evidence of metastatic disease   Xeloda continued   Xeloda dose reduced beginning 04/15/2015 due to progressive hand-foot syndrome.   Xeloda placed on hold 05/15/2015 due to continued hand/foot pain.   Xeloda resumed 06/10/2015.   Restaging CTs 08/09/2015 with no evidence of disease progression   Continuation of maintenance Xeloda 2. Microcytic anemia-likely iron deficiency, improved.  3. Gout. 4. Port-A-Cath placement 12/18/2013.  5. Anxiety. He takes Xanax as needed. 6. History of left knee pain and swelling. 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 Trial of Cymbalta initiated 07/09/2014. Trial of amitriptyline initiated 12/11/2015. 8. History of Thrombocytopenia secondary to chemotherapy. 9. Skin rash 04/30/2014-resolved with doxycycline 10. Chronic edema left lower leg/ankle. Negative Doppler 09/17/2014 11. Hand-foot syndrome secondary to Xeloda. Improved. 12. Enlargement of the left testicle 08/13/2015. He has been evaluated by  urology.      Disposition: [place] Robbins appears stable. Plan to continue Xeloda on the current schedule.  The hand and foot pain is likely related to neuropathy. He will begin a trial of amitriptyline 25 mg at bedtime.  We scheduled a return visit in 4 weeks. He will contact the office in the interim with any problems.  Plan reviewed with Dr. Benay Spice.    [name] ANP/GNP-BC   12/11/2015  9:04 AM

## 2015-12-11 NOTE — Patient Instructions (Signed)

## 2015-12-11 NOTE — Telephone Encounter (Signed)
per pof to sch pt appt-gave pt copy of avs °

## 2015-12-11 NOTE — Progress Notes (Signed)
Oncology Nurse Navigator Documentation  Oncology Nurse Navigator Flowsheets 12/11/2015  Navigator Location [facility]  Navigator Encounter Type Follow-up Appt  Patient Visit Type MedOnc  Treatment Phase Active Tx--Xeloda  Barriers/Navigation Needs No barriers at this time;No Questions;No Needs  Interventions None required  Acuity Level 1  Time Spent with Patient -  Reports getting his Xeloda without difficulty and has copay assistance for year 2017 though a foundation.

## 2015-12-16 ENCOUNTER — Other Ambulatory Visit: Payer: Self-pay | Admitting: *Deleted

## 2015-12-16 DIAGNOSIS — C20 Malignant neoplasm of rectum: Secondary | ICD-10-CM

## 2015-12-16 MED ORDER — ALPRAZOLAM 1 MG PO TABS: 1.0000 mg | ORAL_TABLET | Freq: Three times a day (TID) | ORAL | Status: DC | PRN

## 2016-01-07 ENCOUNTER — Other Ambulatory Visit: Payer: Self-pay | Admitting: *Deleted

## 2016-01-07 DIAGNOSIS — C20 Malignant neoplasm of rectum: Secondary | ICD-10-CM

## 2016-01-07 MED ORDER — CAPECITABINE 500 MG PO TABS: ORAL_TABLET | ORAL | Status: DC

## 2016-01-08 ENCOUNTER — Other Ambulatory Visit: Payer: Self-pay

## 2016-01-09 ENCOUNTER — Other Ambulatory Visit (HOSPITAL_BASED_OUTPATIENT_CLINIC_OR_DEPARTMENT_OTHER): Payer: BLUE CROSS/BLUE SHIELD

## 2016-01-09 ENCOUNTER — Telehealth: Payer: Self-pay | Admitting: Oncology

## 2016-01-09 ENCOUNTER — Ambulatory Visit: Payer: BLUE CROSS/BLUE SHIELD

## 2016-01-09 ENCOUNTER — Ambulatory Visit (HOSPITAL_BASED_OUTPATIENT_CLINIC_OR_DEPARTMENT_OTHER): Payer: BLUE CROSS/BLUE SHIELD | Admitting: Oncology

## 2016-01-09 VITALS — BP 149/92 | HR 64 | Temp 98.4°F | Resp 20 | Ht 72.0 in | Wt 245.8 lb

## 2016-01-09 DIAGNOSIS — I10 Essential (primary) hypertension: Secondary | ICD-10-CM | POA: Diagnosis not present

## 2016-01-09 DIAGNOSIS — C20 Malignant neoplasm of rectum: Secondary | ICD-10-CM | POA: Diagnosis not present

## 2016-01-09 DIAGNOSIS — M79673 Pain in unspecified foot: Secondary | ICD-10-CM

## 2016-01-09 DIAGNOSIS — D509 Iron deficiency anemia, unspecified: Secondary | ICD-10-CM | POA: Diagnosis not present

## 2016-01-09 LAB — CBC WITH DIFFERENTIAL/PLATELET
BASO%: 0.2 % (ref 0.0–2.0)
BASOS ABS: 0 10*3/uL (ref 0.0–0.1)
EOS%: 4.3 % (ref 0.0–7.0)
Eosinophils Absolute: 0.3 10*3/uL (ref 0.0–0.5)
HEMATOCRIT: 42.1 % (ref 38.4–49.9)
HGB: 15.2 g/dL (ref 13.0–17.1)
LYMPH#: 2.1 10*3/uL (ref 0.9–3.3)
LYMPH%: 33.2 % (ref 14.0–49.0)
MCH: 35.1 pg — AB (ref 27.2–33.4)
MCHC: 36.1 g/dL — AB (ref 32.0–36.0)
MCV: 97.2 fL (ref 79.3–98.0)
MONO#: 0.4 10*3/uL (ref 0.1–0.9)
MONO%: 6.4 % (ref 0.0–14.0)
NEUT#: 3.5 10*3/uL (ref 1.5–6.5)
NEUT%: 55.9 % (ref 39.0–75.0)
PLATELETS: 155 10*3/uL (ref 140–400)
RBC: 4.33 10*6/uL (ref 4.20–5.82)
RDW: 14.5 % (ref 11.0–14.6)
WBC: 6.3 10*3/uL (ref 4.0–10.3)

## 2016-01-09 LAB — COMPREHENSIVE METABOLIC PANEL
ALT: 17 U/L (ref 0–55)
AST: 16 U/L (ref 5–34)
Albumin: 3.3 g/dL — ABNORMAL LOW (ref 3.5–5.0)
Alkaline Phosphatase: 71 U/L (ref 40–150)
Anion Gap: 9 mEq/L (ref 3–11)
BUN: 11.3 mg/dL (ref 7.0–26.0)
CHLORIDE: 111 meq/L — AB (ref 98–109)
CO2: 20 meq/L — AB (ref 22–29)
CREATININE: 0.8 mg/dL (ref 0.7–1.3)
Calcium: 8.8 mg/dL (ref 8.4–10.4)
EGFR: >90 mL/min/{1.73_m2} (ref >90)
Glucose: 116 mg/dl (ref 70–140)
Potassium: 3.9 mEq/L (ref 3.5–5.1)
Sodium: 140 mEq/L (ref 136–145)
Total Bilirubin: 0.4 mg/dL (ref 0.20–1.20)
Total Protein: 6.4 g/dL (ref 6.4–8.3)

## 2016-01-09 MED ORDER — HEPARIN SOD (PORK) LOCK FLUSH 100 UNIT/ML IV SOLN
500.0000 [IU] | Freq: Once | INTRAVENOUS | Status: AC | PRN
  Administered 2016-01-09: 500 [IU] via INTRAVENOUS
  Filled 2016-01-09: qty 5

## 2016-01-09 MED ORDER — SODIUM CHLORIDE 0.9 % IJ SOLN
10.0000 mL | INTRAMUSCULAR | Status: DC | PRN
  Administered 2016-01-09: 10 mL via INTRAVENOUS
  Filled 2016-01-09: qty 10

## 2016-01-09 NOTE — Progress Notes (Signed)
[place] OFFICE PROGRESS NOTE   Diagnosis: Rectal cancer  INTERVAL HISTORY:   [name] returns as scheduled. He continues every other week Xeloda. No mouth sores or diarrhea. He reports improvement in foot discomfort since starting Elavil. No difficulty with the colostomy or urostomy. He is working.  Objective:  Vital signs in last 24 hours:  Blood pressure 149/92, pulse 64, temperature 98.4 F (36.9 C), temperature source Oral, resp. rate 20, height 6' (1.829 m), weight 245 lb 12.8 oz (111.494 kg), SpO2 100 %.    HEENT: No thrush or ulcers Resp: Lungs clear bilaterally Cardio: Regular rate and rhythm GI: No hepatomegaly, left lower quadrant colostomy, right lower quadrant urostomy Vascular: Trace ankle edema bilaterally  Skin: Confluence erythema over the face   Portacath/PICC-without erythema  Lab Results:  Lab Results  Component Value Date   WBC 6.3 01/09/2016   HGB 15.2 01/09/2016   HCT 42.1 01/09/2016   MCV 97.2 01/09/2016   PLT 155 01/09/2016   NEUTROABS 3.5 01/09/2016    Lab Results  Component Value Date   CEA1 3.6 11/06/2015   Medications: I have reviewed the patient's current medications.  Assessment/Plan: 1. Clinical stage IV (D9M,E2A,S3) adenocarcinoma of the rectum with tumor directly extending to the bladder/prostate and CT scan evidence of metastatic chest adenopathy  Positive K-ras mutation.   Normal mismatch repair protein expression. Microsatellite stable   Status post low anterior resection and end colostomy with a cystoprostatectomy and colon conduit urinary diversion 11/01/2013.   Staging PET scan with hypermetabolic pelvic nodes, mediastinal nodes, left adrenal metastasis, and left upper lobe nodule.   Initiation of FOLFOX 12/25/2013.   Restaging CT 03/16/2014 after 6 cycles of FOLFOX confirmed improvement in chest/pelvic lymphadenopathy, a left upper lobe nodule, and resolution of a left adrenal nodule    Oxaliplatin deleted from cycle 8 FOLFOX 04/02/2014 secondary to neuropathy and thrombocytopenia.   Oxaliplatin held with cycle 9 FOLFOX 04/16/2014 due to neuropathy.   Cycle 10 FOLFOX 04/30/2014.   Cycle 11 FOLFOX 05/15/2014.   Cycle 12 FOLFOX 05/28/2014. Oxaliplatin held due to increased neuropathy.   Restaging CT evaluation 06/08/2014 with stable mediastinal and hilar adenopathy. Stable borderline left iliac lymph node. No new or progressive disease identified within the chest, abdomen or pelvis.   "Maintenance" 5-fluorouracil beginning 06/11/2014.   Restaging CT evaluation 10/10/2014 showed similar mild mediastinal and right hilar adenopathy. No visible recurrence of the prior left upper lobe metastatic lesion or the left adrenal metastatic lesion. No new lesions noted.   Maintenance Xeloda on a 7 day on/7 day off schedule beginning 10/25/2014   Restaging CTs 03/04/2015 with stable mediastinal lymph nodes and no evidence of metastatic disease   Xeloda continued   Xeloda dose reduced beginning 04/15/2015 due to progressive hand-foot syndrome.   Xeloda placed on hold 05/15/2015 due to continued hand/foot pain.   Xeloda resumed 06/10/2015.   Restaging CTs 08/09/2015 with no evidence of disease progression   Continuation of maintenance Xeloda 2. Microcytic anemia-likely iron deficiency, improved.  3. Gout. 4. Port-A-Cath placement 12/18/2013.  5. Anxiety. He takes Xanax as needed. 6. History of left knee pain and swelling. 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 Trial of Cymbalta initiated 07/09/2014. Trial of amitriptyline initiated 12/11/2015. 8. History of Thrombocytopenia secondary to chemotherapy. 9. Skin rash 04/30/2014-resolved with doxycycline 10. Chronic edema left lower leg/ankle. Negative Doppler 09/17/2014 11. Hand-foot syndrome secondary to Xeloda. Improved. 12. Enlargement  of the left testicle 08/13/2015. He has been evaluated by urology. 13. Hypertension     Disposition:  There is no clinical evidence of disease progression. He will continue Xeloda. We will check this CEA when he returns in 6 weeks.  [place] Coder, MD  01/09/2016  9:31 AM

## 2016-01-09 NOTE — Telephone Encounter (Signed)
per pof to sch pt appt-gave pt copy of avs °

## 2016-01-13 ENCOUNTER — Other Ambulatory Visit: Payer: Self-pay | Admitting: Nurse Practitioner

## 2016-01-13 NOTE — Telephone Encounter (Signed)
Called pt, informed him Xanax Rx was called to pharmacy today. He requested refill on "medicine for neuropathy." Noted Elavil Rx had 1 refill on it, instructed him to call pharmacy for that one. He reports [facility] contacted him today, they will deliver Xeloda on 5/10 for his 5/12 start date.

## 2016-01-14 ENCOUNTER — Encounter: Payer: Self-pay | Admitting: Oncology

## 2016-01-14 NOTE — Progress Notes (Signed)
Per [facility] capecitabine was shipped via [facility] 01/13/16

## 2016-02-05 ENCOUNTER — Other Ambulatory Visit: Payer: Self-pay | Admitting: *Deleted

## 2016-02-05 DIAGNOSIS — C20 Malignant neoplasm of rectum: Secondary | ICD-10-CM

## 2016-02-05 MED ORDER — CAPECITABINE 500 MG PO TABS: ORAL_TABLET | ORAL | Status: DC

## 2016-02-19 ENCOUNTER — Ambulatory Visit: Payer: BLUE CROSS/BLUE SHIELD

## 2016-02-19 ENCOUNTER — Telehealth: Payer: Self-pay | Admitting: Oncology

## 2016-02-19 ENCOUNTER — Other Ambulatory Visit (HOSPITAL_BASED_OUTPATIENT_CLINIC_OR_DEPARTMENT_OTHER): Payer: BLUE CROSS/BLUE SHIELD

## 2016-02-19 ENCOUNTER — Ambulatory Visit (HOSPITAL_BASED_OUTPATIENT_CLINIC_OR_DEPARTMENT_OTHER): Payer: BLUE CROSS/BLUE SHIELD | Admitting: Nurse Practitioner

## 2016-02-19 VITALS — BP 158/89 | HR 56 | Temp 98.0°F | Resp 20 | Ht 72.0 in | Wt 240.8 lb

## 2016-02-19 DIAGNOSIS — C20 Malignant neoplasm of rectum: Secondary | ICD-10-CM

## 2016-02-19 DIAGNOSIS — I10 Essential (primary) hypertension: Secondary | ICD-10-CM

## 2016-02-19 DIAGNOSIS — D509 Iron deficiency anemia, unspecified: Secondary | ICD-10-CM | POA: Diagnosis not present

## 2016-02-19 LAB — CBC WITH DIFFERENTIAL/PLATELET
BASO%: 0.2 % (ref 0.0–2.0)
Basophils Absolute: 0 10*3/uL (ref 0.0–0.1)
EOS ABS: 0.3 10*3/uL (ref 0.0–0.5)
EOS%: 5.1 % (ref 0.0–7.0)
HEMATOCRIT: 42.3 % (ref 38.4–49.9)
HGB: 15.1 g/dL (ref 13.0–17.1)
LYMPH%: 34.5 % (ref 14.0–49.0)
MCH: 35.2 pg — ABNORMAL HIGH (ref 27.2–33.4)
MCHC: 35.7 g/dL (ref 32.0–36.0)
MCV: 98.6 fL — AB (ref 79.3–98.0)
MONO#: 0.4 10*3/uL (ref 0.1–0.9)
MONO%: 7.5 % (ref 0.0–14.0)
NEUT%: 52.7 % (ref 39.0–75.0)
NEUTROS ABS: 3.1 10*3/uL (ref 1.5–6.5)
PLATELETS: 137 10*3/uL — AB (ref 140–400)
RBC: 4.29 10*6/uL (ref 4.20–5.82)
RDW: 14.6 % (ref 11.0–14.6)
WBC: 5.9 10*3/uL (ref 4.0–10.3)
lymph#: 2 10*3/uL (ref 0.9–3.3)

## 2016-02-19 LAB — COMPREHENSIVE METABOLIC PANEL
ALK PHOS: 69 U/L (ref 40–150)
ALT: 20 U/L (ref 0–55)
ANION GAP: 9 meq/L (ref 3–11)
AST: 19 U/L (ref 5–34)
Albumin: 3.5 g/dL (ref 3.5–5.0)
BILIRUBIN TOTAL: 0.53 mg/dL (ref 0.20–1.20)
BUN: 11.6 mg/dL (ref 7.0–26.0)
CALCIUM: 8.5 mg/dL (ref 8.4–10.4)
CO2: 21 meq/L — AB (ref 22–29)
Chloride: 109 mEq/L (ref 98–109)
Creatinine: 0.9 mg/dL (ref 0.7–1.3)
Glucose: 102 mg/dl (ref 70–140)
Potassium: 3.8 mEq/L (ref 3.5–5.1)
Sodium: 139 mEq/L (ref 136–145)
TOTAL PROTEIN: 6.6 g/dL (ref 6.4–8.3)

## 2016-02-19 MED ORDER — SODIUM CHLORIDE 0.9 % IJ SOLN
10.0000 mL | INTRAMUSCULAR | Status: DC | PRN
  Administered 2016-02-19: 10 mL via INTRAVENOUS
  Filled 2016-02-19: qty 10

## 2016-02-19 MED ORDER — ALPRAZOLAM 1 MG PO TABS: ORAL_TABLET | ORAL | Status: DC

## 2016-02-19 MED ORDER — HEPARIN SOD (PORK) LOCK FLUSH 100 UNIT/ML IV SOLN
500.0000 [IU] | Freq: Once | INTRAVENOUS | Status: AC | PRN
  Administered 2016-02-19: 500 [IU] via INTRAVENOUS
  Filled 2016-02-19: qty 5

## 2016-02-19 MED ORDER — AMITRIPTYLINE HCL 25 MG PO TABS: 25.0000 mg | ORAL_TABLET | Freq: Every day | ORAL | Status: DC

## 2016-02-19 NOTE — Patient Instructions (Signed)

## 2016-02-19 NOTE — Progress Notes (Signed)
[place] OFFICE PROGRESS NOTE   Diagnosis:  Rectal cancer  INTERVAL HISTORY:   [place] Robbins returns as scheduled. He continues every other week Xeloda. He denies nausea/vomiting. No mouth sores. No diarrhea. No hand or foot redness. He thinks Elavil has helped the neuropathy symptoms in his feet.  Objective:  Vital signs in last 24 hours:  Blood pressure 158/89, pulse 56, temperature 98 F (36.7 C), temperature source Oral, resp. rate 20, height 6' (1.829 m), weight 240 lb 12.8 oz (109.226 kg), SpO2 100 %.    HEENT: No thrush or ulcers. Resp: Lungs clear bilaterally. Cardio: Regular rate and rhythm. GI: Abdomen soft and nontender. No hepatomegaly. Left lower quadrant colostomy. Right lower quadrant urostomy. Vascular: Trace lower leg/ankle edema bilaterally. Skin: Palms with mild erythema and dryness.  Port-A-Cath without erythema.  Lab Results:  Lab Results  Component Value Date   WBC 5.9 02/19/2016   HGB 15.1 02/19/2016   HCT 42.3 02/19/2016   MCV 98.6* 02/19/2016   PLT 137* 02/19/2016   NEUTROABS 3.1 02/19/2016    Imaging:  No results found.  Medications: I have reviewed the patient's current medications.  Assessment/Plan: 1. Clinical stage IV (V4U,J8J,X9) adenocarcinoma of the rectum with tumor directly extending to the bladder/prostate and CT scan evidence of metastatic chest adenopathy  Positive K-ras mutation.   Normal mismatch repair protein expression. Microsatellite stable   Status post low anterior resection and end colostomy with a cystoprostatectomy and colon conduit urinary diversion 11/01/2013.   Staging PET scan with hypermetabolic pelvic nodes, mediastinal nodes, left adrenal metastasis, and left upper lobe nodule.   Initiation of FOLFOX 12/25/2013.   Restaging CT 03/16/2014 after 6 cycles of FOLFOX confirmed improvement in chest/pelvic lymphadenopathy, a left upper lobe nodule, and resolution of a left adrenal nodule    Oxaliplatin deleted from cycle 8 FOLFOX 04/02/2014 secondary to neuropathy and thrombocytopenia.   Oxaliplatin held with cycle 9 FOLFOX 04/16/2014 due to neuropathy.   Cycle 10 FOLFOX 04/30/2014.   Cycle 11 FOLFOX 05/15/2014.   Cycle 12 FOLFOX 05/28/2014. Oxaliplatin held due to increased neuropathy.   Restaging CT evaluation 06/08/2014 with stable mediastinal and hilar adenopathy. Stable borderline left iliac lymph node. No new or progressive disease identified within the chest, abdomen or pelvis.   "Maintenance" 5-fluorouracil beginning 06/11/2014.   Restaging CT evaluation 10/10/2014 showed similar mild mediastinal and right hilar adenopathy. No visible recurrence of the prior left upper lobe metastatic lesion or the left adrenal metastatic lesion. No new lesions noted.   Maintenance Xeloda on a 7 day on/7 day off schedule beginning 10/25/2014   Restaging CTs 03/04/2015 with stable mediastinal lymph nodes and no evidence of metastatic disease   Xeloda continued   Xeloda dose reduced beginning 04/15/2015 due to progressive hand-foot syndrome.   Xeloda placed on hold 05/15/2015 due to continued hand/foot pain.   Xeloda resumed 06/10/2015.   Restaging CTs 08/09/2015 with no evidence of disease progression   Continuation of maintenance Xeloda 2. Microcytic anemia-likely iron deficiency, improved.  3. Gout. 4. Port-A-Cath placement 12/18/2013.  5. Anxiety. He takes Xanax as needed. 6. History of left knee pain and swelling. 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 Trial of Cymbalta initiated 07/09/2014. Trial of amitriptyline initiated 12/11/2015. 8. History of Thrombocytopenia secondary to chemotherapy. 9. Skin rash 04/30/2014-resolved with doxycycline 10. Chronic edema left lower leg/ankle. Negative Doppler 09/17/2014 11. Hand-foot syndrome secondary to Xeloda. Improved. 12. Enlargement  of  the left testicle 08/13/2015. He has been evaluated by urology. 13. Hypertension   Disposition: [place] Robbins appears well. There is no clinical evidence of disease progression. He will continue Xeloda. We will follow-up on the CEA from today. He will return for a follow-up visit, labs and Port-A-Cath flush in 6 weeks. He will contact the office in the interim with any problems. We refilled Xanax and Elavil at today's visit.    [name] ANP/GNP-BC   02/19/2016  8:45 AM

## 2016-02-19 NOTE — Progress Notes (Signed)
Called in Xanax in to [facility] pharmacy in [place].

## 2016-02-19 NOTE — Telephone Encounter (Signed)
per pof to sch pt appt-gave pt copy of avs °

## 2016-02-20 ENCOUNTER — Other Ambulatory Visit: Payer: BLUE CROSS/BLUE SHIELD

## 2016-02-20 ENCOUNTER — Ambulatory Visit: Payer: BLUE CROSS/BLUE SHIELD | Admitting: Nurse Practitioner

## 2016-02-20 LAB — CEA: CEA1: 4.2 ng/mL (ref 0.0–4.7)

## 2016-03-04 ENCOUNTER — Encounter: Payer: Self-pay | Admitting: *Deleted

## 2016-03-04 NOTE — Progress Notes (Signed)
Capecitabine refill signed by Dr. Benay Spice and faxed to [facility]. Fax transmission complete.

## 2016-04-01 ENCOUNTER — Other Ambulatory Visit (HOSPITAL_BASED_OUTPATIENT_CLINIC_OR_DEPARTMENT_OTHER): Payer: BLUE CROSS/BLUE SHIELD

## 2016-04-01 ENCOUNTER — Telehealth: Payer: Self-pay | Admitting: Oncology

## 2016-04-01 ENCOUNTER — Ambulatory Visit (HOSPITAL_BASED_OUTPATIENT_CLINIC_OR_DEPARTMENT_OTHER): Payer: BLUE CROSS/BLUE SHIELD | Admitting: Oncology

## 2016-04-01 ENCOUNTER — Ambulatory Visit: Payer: BLUE CROSS/BLUE SHIELD

## 2016-04-01 DIAGNOSIS — D509 Iron deficiency anemia, unspecified: Secondary | ICD-10-CM | POA: Diagnosis not present

## 2016-04-01 DIAGNOSIS — M25472 Effusion, left ankle: Secondary | ICD-10-CM | POA: Diagnosis not present

## 2016-04-01 DIAGNOSIS — C20 Malignant neoplasm of rectum: Secondary | ICD-10-CM

## 2016-04-01 DIAGNOSIS — I10 Essential (primary) hypertension: Secondary | ICD-10-CM

## 2016-04-01 LAB — COMPREHENSIVE METABOLIC PANEL
ALBUMIN: 3.4 g/dL — AB (ref 3.5–5.0)
ALK PHOS: 83 U/L (ref 40–150)
ALT: 21 U/L (ref 0–55)
ANION GAP: 9 meq/L (ref 3–11)
AST: 21 U/L (ref 5–34)
BILIRUBIN TOTAL: 0.47 mg/dL (ref 0.20–1.20)
BUN: 14 mg/dL (ref 7.0–26.0)
CALCIUM: 8.8 mg/dL (ref 8.4–10.4)
CO2: 22 meq/L (ref 22–29)
CREATININE: 0.8 mg/dL (ref 0.7–1.3)
Chloride: 110 mEq/L — ABNORMAL HIGH (ref 98–109)
Glucose: 111 mg/dl (ref 70–140)
Potassium: 4.3 mEq/L (ref 3.5–5.1)
Sodium: 141 mEq/L (ref 136–145)
TOTAL PROTEIN: 6.6 g/dL (ref 6.4–8.3)

## 2016-04-01 LAB — CBC WITH DIFFERENTIAL/PLATELET
BASO%: 0.2 % (ref 0.0–2.0)
Basophils Absolute: 0 10*3/uL (ref 0.0–0.1)
EOS ABS: 0.2 10*3/uL (ref 0.0–0.5)
EOS%: 3.4 % (ref 0.0–7.0)
HEMATOCRIT: 40.8 % (ref 38.4–49.9)
HGB: 14.7 g/dL (ref 13.0–17.1)
LYMPH#: 1.9 10*3/uL (ref 0.9–3.3)
LYMPH%: 34.7 % (ref 14.0–49.0)
MCH: 35.3 pg — ABNORMAL HIGH (ref 27.2–33.4)
MCHC: 36 g/dL (ref 32.0–36.0)
MCV: 98.1 fL — AB (ref 79.3–98.0)
MONO#: 0.5 10*3/uL (ref 0.1–0.9)
MONO%: 8.9 % (ref 0.0–14.0)
NEUT%: 52.8 % (ref 39.0–75.0)
NEUTROS ABS: 2.9 10*3/uL (ref 1.5–6.5)
PLATELETS: 148 10*3/uL (ref 140–400)
RBC: 4.16 10*6/uL — AB (ref 4.20–5.82)
RDW: 14.1 % (ref 11.0–14.6)
WBC: 5.5 10*3/uL (ref 4.0–10.3)

## 2016-04-01 MED ORDER — INDOMETHACIN 50 MG PO CAPS: 50.0000 mg | ORAL_CAPSULE | Freq: Three times a day (TID) | ORAL | 0 refills | Status: DC | PRN

## 2016-04-01 MED ORDER — HEPARIN SOD (PORK) LOCK FLUSH 100 UNIT/ML IV SOLN
500.0000 [IU] | Freq: Once | INTRAVENOUS | Status: AC | PRN
  Administered 2016-04-01: 500 [IU] via INTRAVENOUS
  Filled 2016-04-01: qty 5

## 2016-04-01 MED ORDER — SODIUM CHLORIDE 0.9 % IJ SOLN
10.0000 mL | INTRAMUSCULAR | Status: DC | PRN
  Administered 2016-04-01: 10 mL via INTRAVENOUS
  Filled 2016-04-01: qty 10

## 2016-04-01 MED ORDER — ALPRAZOLAM 1 MG PO TABS: ORAL_TABLET | ORAL | 0 refills | Status: DC

## 2016-04-01 MED ORDER — CAPECITABINE 500 MG PO TABS: ORAL_TABLET | ORAL | 1 refills | Status: DC

## 2016-04-01 NOTE — Progress Notes (Signed)
[facility] OFFICE PROGRESS NOTE   Diagnosis: Rectal cancer  INTERVAL HISTORY:   Austin Robbins returns as scheduled. He continues every other week Xeloda. No mouth sores, diarrhea, or hand/foot pain. He had a flare of gout in the left ankle last weekend. He reports acute onset of pain and swelling in the left ankle beginning 03/29/2016. His symptoms improved with Indocin. He continues to have irritation of the skin at the urostomy site.  He is working and Marketing executive.  Objective:  Vital signs in last 24 hours:  Blood pressure (!) 148/88, pulse 60, temperature 98 F (36.7 C), temperature source Oral, resp. rate 18, height 6' (1.829 m), weight 240 lb 9.6 oz (109.1 kg), SpO2 100 %.    HEENT: No thrush or ulcers Resp: Lungs clear bilaterally Cardio: Regular rate and rhythm GI: No hepatomegaly, right lower quadrant urostomy, left lower quadrant colostomy Vascular: Trace pretibial edema bilaterally  Musculoskeletal: Pitting edema at the left ankle. No erythema. No pain with motion at the left ankle.   Portacath/PICC-without erythema  Lab Results:  Lab Results  Component Value Date   WBC 5.5 04/01/2016   HGB 14.7 04/01/2016   HCT 40.8 04/01/2016   MCV 98.1 (H) 04/01/2016   PLT 148 04/01/2016   NEUTROABS 2.9 04/01/2016     Lab Results  Component Value Date   CEA1 4.2 02/19/2016    Imaging:  No results found.  Medications: I have reviewed the patient's current medications.  Assessment/Plan: 1. Clinical stage IV (O3J,K0X,F8) adenocarcinoma of the rectum with tumor directly extending to the bladder/prostate and CT scan evidence of metastatic chest adenopathy  Positive K-ras mutation.   Normal mismatch repair protein expression. Microsatellite stable   Status post low anterior resection and end colostomy with a cystoprostatectomy and colon conduit urinary diversion 11/01/2013.   Staging PET scan with hypermetabolic pelvic nodes, mediastinal nodes,  left adrenal metastasis, and left upper lobe nodule.   Initiation of FOLFOX 12/25/2013.   Restaging CT 03/16/2014 after 6 cycles of FOLFOX confirmed improvement in chest/pelvic lymphadenopathy, a left upper lobe nodule, and resolution of a left adrenal nodule   Oxaliplatin deleted from cycle 8 FOLFOX 04/02/2014 secondary to neuropathy and thrombocytopenia.   Oxaliplatin held with cycle 9 FOLFOX 04/16/2014 due to neuropathy.   Cycle 10 FOLFOX 04/30/2014.   Cycle 11 FOLFOX 05/15/2014.   Cycle 12 FOLFOX 05/28/2014. Oxaliplatin held due to increased neuropathy.   Restaging CT evaluation 06/08/2014 with stable mediastinal and hilar adenopathy. Stable borderline left iliac lymph node. No new or progressive disease identified within the chest, abdomen or pelvis.   "Maintenance" 5-fluorouracil beginning 06/11/2014.   Restaging CT evaluation 10/10/2014 showed similar mild mediastinal and right hilar adenopathy. No visible recurrence of the prior left upper lobe metastatic lesion or the left adrenal metastatic lesion. No new lesions noted.   Maintenance Xeloda on a 7 day on/7 day off schedule beginning 10/25/2014   Restaging CTs 03/04/2015 with stable mediastinal lymph nodes and no evidence of metastatic disease   Xeloda continued   Xeloda dose reduced beginning 04/15/2015 due to progressive hand-foot syndrome.   Xeloda placed on hold 05/15/2015 due to continued hand/foot pain.   Xeloda resumed 06/10/2015.   Restaging CTs 08/09/2015 with no evidence of disease progression   Continuation of maintenance Xeloda 2. Microcytic anemia-likely iron deficiency, improved.  3. Gout. 4. Port-A-Cath placement 12/18/2013.  5. Anxiety. He takes Xanax as needed. 6. History of left knee pain and swelling. 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 Trial of Cymbalta initiated 07/09/2014. Trial of amitriptyline  initiated 12/11/2015. 8. History of Thrombocytopenia secondary to chemotherapy. 9. Skin rash 04/30/2014-resolved with doxycycline 10. Chronic edema left lower leg/ankle. Negative Doppler 09/17/2014 11. Hand-foot syndrome secondary to Xeloda. Improved. 12. Enlargement of the left testicle 08/13/2015. He has been evaluated by urology. 13. Hypertension    Disposition:  [name] appears stable. He will continue Xeloda on the current schedule. We will check the CEA when he returns in 6 weeks.  We refilled prescriptions for Xanax, Indocin, and Xeloda today.  I have a low clinical suspicion for a left leg DVT. He most likely had a flare of gout at the left ankle. This has improved.  Austin Coder, MD  04/01/2016  9:37 AM

## 2016-04-01 NOTE — Telephone Encounter (Signed)
per pof to sch pt appt-gave pt copy of avs °

## 2016-04-01 NOTE — Patient Instructions (Signed)

## 2016-04-02 ENCOUNTER — Telehealth: Payer: Self-pay

## 2016-04-02 LAB — CEA: CEA1: 3.7 ng/mL (ref 0.0–4.7)

## 2016-04-02 NOTE — Telephone Encounter (Signed)
Called and informed pt of normal CEA results. Pt verbalized understanding and denies any questions or concerns at this time. 

## 2016-04-02 NOTE — Telephone Encounter (Signed)
-----   Message from Owens Shark, NP sent at 04/02/2016  8:56 AM EDT ----- Please let him know CEA is stable in normal range.

## 2016-05-13 ENCOUNTER — Other Ambulatory Visit (HOSPITAL_BASED_OUTPATIENT_CLINIC_OR_DEPARTMENT_OTHER): Payer: BLUE CROSS/BLUE SHIELD

## 2016-05-13 ENCOUNTER — Telehealth: Payer: Self-pay | Admitting: Oncology

## 2016-05-13 ENCOUNTER — Encounter: Payer: Self-pay | Admitting: Nurse Practitioner

## 2016-05-13 ENCOUNTER — Ambulatory Visit (HOSPITAL_BASED_OUTPATIENT_CLINIC_OR_DEPARTMENT_OTHER): Payer: BLUE CROSS/BLUE SHIELD

## 2016-05-13 ENCOUNTER — Ambulatory Visit (HOSPITAL_BASED_OUTPATIENT_CLINIC_OR_DEPARTMENT_OTHER): Payer: BLUE CROSS/BLUE SHIELD | Admitting: Nurse Practitioner

## 2016-05-13 VITALS — BP 168/106 | HR 63 | Temp 97.9°F | Resp 18 | Ht 72.0 in | Wt 245.9 lb

## 2016-05-13 DIAGNOSIS — M109 Gout, unspecified: Secondary | ICD-10-CM

## 2016-05-13 DIAGNOSIS — I10 Essential (primary) hypertension: Secondary | ICD-10-CM

## 2016-05-13 DIAGNOSIS — M10072 Idiopathic gout, left ankle and foot: Secondary | ICD-10-CM | POA: Diagnosis not present

## 2016-05-13 DIAGNOSIS — C20 Malignant neoplasm of rectum: Secondary | ICD-10-CM | POA: Diagnosis not present

## 2016-05-13 DIAGNOSIS — C7911 Secondary malignant neoplasm of bladder: Secondary | ICD-10-CM

## 2016-05-13 HISTORY — DX: Essential (primary) hypertension: I10

## 2016-05-13 LAB — COMPREHENSIVE METABOLIC PANEL
ALT: 24 U/L (ref 0–55)
ANION GAP: 11 meq/L (ref 3–11)
AST: 20 U/L (ref 5–34)
Albumin: 3.4 g/dL — ABNORMAL LOW (ref 3.5–5.0)
Alkaline Phosphatase: 72 U/L (ref 40–150)
BILIRUBIN TOTAL: 0.47 mg/dL (ref 0.20–1.20)
BUN: 13.6 mg/dL (ref 7.0–26.0)
CHLORIDE: 110 meq/L — AB (ref 98–109)
CO2: 18 meq/L — AB (ref 22–29)
Calcium: 8.6 mg/dL (ref 8.4–10.4)
Creatinine: 0.8 mg/dL (ref 0.7–1.3)
Glucose: 104 mg/dl (ref 70–140)
POTASSIUM: 4.2 meq/L (ref 3.5–5.1)
Sodium: 139 mEq/L (ref 136–145)
TOTAL PROTEIN: 6.6 g/dL (ref 6.4–8.3)

## 2016-05-13 LAB — CBC WITH DIFFERENTIAL/PLATELET
BASO%: 0.2 % (ref 0.0–2.0)
BASOS ABS: 0 10*3/uL (ref 0.0–0.1)
EOS ABS: 0.2 10*3/uL (ref 0.0–0.5)
EOS%: 2.4 % (ref 0.0–7.0)
HCT: 42 % (ref 38.4–49.9)
HGB: 15.2 g/dL (ref 13.0–17.1)
LYMPH%: 24.6 % (ref 14.0–49.0)
MCH: 34.9 pg — AB (ref 27.2–33.4)
MCHC: 36.2 g/dL — AB (ref 32.0–36.0)
MCV: 96.6 fL (ref 79.3–98.0)
MONO#: 0.6 10*3/uL (ref 0.1–0.9)
MONO%: 7.4 % (ref 0.0–14.0)
NEUT#: 5.5 10*3/uL (ref 1.5–6.5)
NEUT%: 65.4 % (ref 39.0–75.0)
PLATELETS: 167 10*3/uL (ref 140–400)
RBC: 4.35 10*6/uL (ref 4.20–5.82)
RDW: 13.9 % (ref 11.0–14.6)
WBC: 8.4 10*3/uL (ref 4.0–10.3)
lymph#: 2.1 10*3/uL (ref 0.9–3.3)
nRBC: 0 % (ref 0–0)

## 2016-05-13 LAB — CEA (IN HOUSE-CHCC): CEA (CHCC-In House): 2.68 ng/mL (ref 0.00–5.00)

## 2016-05-13 MED ORDER — AMLODIPINE BESYLATE 5 MG PO TABS: 5.0000 mg | ORAL_TABLET | Freq: Every day | ORAL | 0 refills | Status: DC

## 2016-05-13 MED ORDER — SODIUM CHLORIDE 0.9 % IJ SOLN
10.0000 mL | INTRAMUSCULAR | Status: DC | PRN
  Administered 2016-05-13: 10 mL via INTRAVENOUS
  Filled 2016-05-13: qty 10

## 2016-05-13 MED ORDER — INDOMETHACIN 50 MG PO CAPS: 50.0000 mg | ORAL_CAPSULE | Freq: Three times a day (TID) | ORAL | 0 refills | Status: DC | PRN

## 2016-05-13 MED ORDER — HEPARIN SOD (PORK) LOCK FLUSH 100 UNIT/ML IV SOLN
500.0000 [IU] | Freq: Once | INTRAVENOUS | Status: AC | PRN
  Administered 2016-05-13: 500 [IU] via INTRAVENOUS
  Filled 2016-05-13: qty 5

## 2016-05-13 NOTE — Progress Notes (Signed)
[place] OFFICE PROGRESS NOTE   Diagnosis:  Rectal cancer  INTERVAL HISTORY:   Austin Robbins returns as scheduled. He continues every other week Xeloda. He denies nausea/vomiting. No mouth sores. No diarrhea. No hand pain. He notes mild redness over the palms. He reports a recent gout flare involving the left great toe. He took Indocin for a few days and then discontinued when the symptoms improved. Symptoms subsequently recurred and he has resumed Indocin.  Objective:  Vital signs in last 24 hours:  Blood pressure (!) 168/104, pulse 63, temperature 97.9 F (36.6 C), temperature source Oral, resp. rate 18, height 6' (1.829 m), weight 245 lb 14.4 oz (111.5 kg), SpO2 99 %.    HEENT: No thrush or ulcers. Resp: Lungs clear bilaterally. Cardio: Regular rate and rhythm. GI: Abdomen soft and nontender. No hepatomegaly. Right lower quadrant urostomy. Left lower quadrant colostomy. Vascular: Trace to 1+ bilateral lower leg edema. Skin: Palms with mild erythema. No skin breakdown. Musculoskeletal: First left metatarsophalangeal joint erythematous, edematous, tender.   Lab Results:  Lab Results  Component Value Date   WBC 5.5 04/01/2016   HGB 14.7 04/01/2016   HCT 40.8 04/01/2016   MCV 98.1 (H) 04/01/2016   PLT 148 04/01/2016   NEUTROABS 2.9 04/01/2016    Imaging:  No results found.  Medications: I have reviewed the patient's current medications.  Assessment/Plan: 1. Clinical stage IV (Z6X,W9U,E4) adenocarcinoma of the rectum with tumor directly extending to the bladder/prostate and CT scan evidence of metastatic chest adenopathy  Positive K-ras mutation.   Normal mismatch repair protein expression. Microsatellite stable   Status post low anterior resection and end colostomy with a cystoprostatectomy and colon conduit urinary diversion 11/01/2013.   Staging PET scan with hypermetabolic pelvic nodes, mediastinal nodes, left adrenal metastasis, and left  upper lobe nodule.   Initiation of FOLFOX 12/25/2013.   Restaging CT 03/16/2014 after 6 cycles of FOLFOX confirmed improvement in chest/pelvic lymphadenopathy, a left upper lobe nodule, and resolution of a left adrenal nodule   Oxaliplatin deleted from cycle 8 FOLFOX 04/02/2014 secondary to neuropathy and thrombocytopenia.   Oxaliplatin held with cycle 9 FOLFOX 04/16/2014 due to neuropathy.   Cycle 10 FOLFOX 04/30/2014.   Cycle 11 FOLFOX 05/15/2014.   Cycle 12 FOLFOX 05/28/2014. Oxaliplatin held due to increased neuropathy.   Restaging CT evaluation 06/08/2014 with stable mediastinal and hilar adenopathy. Stable borderline left iliac lymph node. No new or progressive disease identified within the chest, abdomen or pelvis.   "Maintenance" 5-fluorouracil beginning 06/11/2014.   Restaging CT evaluation 10/10/2014 showed similar mild mediastinal and right hilar adenopathy. No visible recurrence of the prior left upper lobe metastatic lesion or the left adrenal metastatic lesion. No new lesions noted.   Maintenance Xeloda on a 7 day on/7 day off schedule beginning 10/25/2014   Restaging CTs 03/04/2015 with stable mediastinal lymph nodes and no evidence of metastatic disease   Xeloda continued   Xeloda dose reduced beginning 04/15/2015 due to progressive hand-foot syndrome.   Xeloda placed on hold 05/15/2015 due to continued hand/foot pain.   Xeloda resumed 06/10/2015.   Restaging CTs 08/09/2015 with no evidence of disease progression   Continuation of maintenance Xeloda 2. Microcytic anemia-likely iron deficiency, improved.  3. Gout. 4. Port-A-Cath placement 12/18/2013.  5. Anxiety. He takes Xanax as needed. 6. History of left knee pain and swelling. 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 Trial of Cymbalta initiated 07/09/2014. Trial  of amitriptyline initiated 12/11/2015. 8. History of  Thrombocytopenia secondary to chemotherapy. 9. Skin rash 04/30/2014-resolved with doxycycline 10. Chronic edema left lower leg/ankle. Negative Doppler 09/17/2014 11. Hand-foot syndrome secondary to Xeloda. Improved. 12. Enlargement of the left testicle 08/13/2015. He has been evaluated by urology. 13. Hypertension   Disposition: Austin Robbins appears stable. There is no clinical evidence of disease progression. He will continue Xeloda on the current schedule. We will follow-up on the CEA from today. The plan is for restaging CT scans at a one-year interval, sooner with clinical evidence of progression.  He appears to have an acute gout flare involving the left great toe. We reviewed the dosing instructions for Indocin. He will continue the Indocin for several days beyond improvement in symptoms. He will follow up with his PCP if symptoms do not resolve or recur again.  We decided to begin Norvasc 5 mg daily for management of hypertension.  He will return for a follow-up visit in 6 weeks. He will contact the office in the interim with any problems.  Plan reviewed with Dr. Benay Spice. 25 minutes were spent face-to-face at today's visit with the majority of that time involved in counseling/coordination of care.  [name] ANP/GNP-BC   05/13/2016  9:35 AM

## 2016-05-13 NOTE — Telephone Encounter (Signed)
Gave patient avs report and appointments for October  °

## 2016-05-14 LAB — CEA: CEA1: 4.1 ng/mL (ref 0.0–4.7)

## 2016-05-20 ENCOUNTER — Telehealth: Payer: Self-pay

## 2016-05-20 NOTE — Telephone Encounter (Signed)
Called to follow up with patients BP. Spoke with terri, patients wife who states they checked it the same day of his last appt when they got home and it came down to 145/95. States he took the Norvasc that day only. Following that day the BP's were 107/68, 107/65, 127/76, 120/74, 117/73. Karna Christmas states they will continue to check it but thinks he was just stressed the day of his appt and doesn't think he needs the BP pills at this time. Informed [name], NP. Informed Terri to call with any questions or concerns.

## 2016-05-26 ENCOUNTER — Other Ambulatory Visit: Payer: Self-pay | Admitting: *Deleted

## 2016-05-26 DIAGNOSIS — C20 Malignant neoplasm of rectum: Secondary | ICD-10-CM

## 2016-05-26 MED ORDER — CAPECITABINE 500 MG PO TABS: ORAL_TABLET | ORAL | 1 refills | Status: DC

## 2016-06-09 ENCOUNTER — Other Ambulatory Visit: Payer: Self-pay | Admitting: Nurse Practitioner

## 2016-06-09 DIAGNOSIS — I10 Essential (primary) hypertension: Secondary | ICD-10-CM

## 2016-06-09 DIAGNOSIS — C20 Malignant neoplasm of rectum: Secondary | ICD-10-CM

## 2016-06-14 ENCOUNTER — Other Ambulatory Visit: Payer: Self-pay | Admitting: Nurse Practitioner

## 2016-06-14 DIAGNOSIS — C20 Malignant neoplasm of rectum: Secondary | ICD-10-CM

## 2016-06-24 ENCOUNTER — Ambulatory Visit (HOSPITAL_BASED_OUTPATIENT_CLINIC_OR_DEPARTMENT_OTHER): Payer: BLUE CROSS/BLUE SHIELD

## 2016-06-24 ENCOUNTER — Ambulatory Visit (HOSPITAL_BASED_OUTPATIENT_CLINIC_OR_DEPARTMENT_OTHER): Payer: BLUE CROSS/BLUE SHIELD | Admitting: Nurse Practitioner

## 2016-06-24 ENCOUNTER — Other Ambulatory Visit (HOSPITAL_BASED_OUTPATIENT_CLINIC_OR_DEPARTMENT_OTHER): Payer: BLUE CROSS/BLUE SHIELD

## 2016-06-24 ENCOUNTER — Other Ambulatory Visit: Payer: Self-pay

## 2016-06-24 ENCOUNTER — Telehealth: Payer: Self-pay | Admitting: Oncology

## 2016-06-24 ENCOUNTER — Other Ambulatory Visit: Payer: Self-pay | Admitting: *Deleted

## 2016-06-24 VITALS — BP 152/85 | HR 55 | Temp 98.2°F | Resp 18 | Ht 72.0 in | Wt 241.8 lb

## 2016-06-24 DIAGNOSIS — D509 Iron deficiency anemia, unspecified: Secondary | ICD-10-CM | POA: Diagnosis not present

## 2016-06-24 DIAGNOSIS — M25541 Pain in joints of right hand: Secondary | ICD-10-CM

## 2016-06-24 DIAGNOSIS — I10 Essential (primary) hypertension: Secondary | ICD-10-CM | POA: Diagnosis not present

## 2016-06-24 DIAGNOSIS — C20 Malignant neoplasm of rectum: Secondary | ICD-10-CM

## 2016-06-24 DIAGNOSIS — Z452 Encounter for adjustment and management of vascular access device: Secondary | ICD-10-CM | POA: Diagnosis not present

## 2016-06-24 LAB — COMPREHENSIVE METABOLIC PANEL
ALT: 17 U/L (ref 0–55)
ANION GAP: 7 meq/L (ref 3–11)
AST: 20 U/L (ref 5–34)
Albumin: 3.6 g/dL (ref 3.5–5.0)
Alkaline Phosphatase: 77 U/L (ref 40–150)
BILIRUBIN TOTAL: 0.66 mg/dL (ref 0.20–1.20)
BUN: 12.4 mg/dL (ref 7.0–26.0)
CALCIUM: 8.8 mg/dL (ref 8.4–10.4)
CO2: 22 mEq/L (ref 22–29)
CREATININE: 0.8 mg/dL (ref 0.7–1.3)
Chloride: 111 mEq/L — ABNORMAL HIGH (ref 98–109)
EGFR: >90 mL/min/{1.73_m2} (ref >90)
Glucose: 91 mg/dl (ref 70–140)
Potassium: 4.3 mEq/L (ref 3.5–5.1)
Sodium: 140 mEq/L (ref 136–145)
TOTAL PROTEIN: 6.8 g/dL (ref 6.4–8.3)

## 2016-06-24 LAB — CBC WITH DIFFERENTIAL/PLATELET
BASO%: 0.2 % (ref 0.0–2.0)
Basophils Absolute: 0 10*3/uL (ref 0.0–0.1)
EOS%: 3.7 % (ref 0.0–7.0)
Eosinophils Absolute: 0.2 10*3/uL (ref 0.0–0.5)
HCT: 42.5 % (ref 38.4–49.9)
HEMOGLOBIN: 15.2 g/dL (ref 13.0–17.1)
LYMPH%: 33.4 % (ref 14.0–49.0)
MCH: 34.7 pg — AB (ref 27.2–33.4)
MCHC: 35.8 g/dL (ref 32.0–36.0)
MCV: 97 fL (ref 79.3–98.0)
MONO#: 0.5 10*3/uL (ref 0.1–0.9)
MONO%: 7.9 % (ref 0.0–14.0)
NEUT%: 54.8 % (ref 39.0–75.0)
NEUTROS ABS: 3.3 10*3/uL (ref 1.5–6.5)
Platelets: 147 10*3/uL (ref 140–400)
RBC: 4.38 10*6/uL (ref 4.20–5.82)
RDW: 14.2 % (ref 11.0–14.6)
WBC: 6 10*3/uL (ref 4.0–10.3)
lymph#: 2 10*3/uL (ref 0.9–3.3)

## 2016-06-24 LAB — CEA (IN HOUSE-CHCC): CEA (CHCC-IN HOUSE): 3.37 ng/mL (ref 0.00–5.00)

## 2016-06-24 MED ORDER — ALPRAZOLAM 1 MG PO TABS: ORAL_TABLET | ORAL | 0 refills | Status: DC

## 2016-06-24 MED ORDER — HEPARIN SOD (PORK) LOCK FLUSH 100 UNIT/ML IV SOLN
500.0000 [IU] | Freq: Once | INTRAVENOUS | Status: AC | PRN
  Administered 2016-06-24: 500 [IU] via INTRAVENOUS
  Filled 2016-06-24: qty 5

## 2016-06-24 MED ORDER — CAPECITABINE 500 MG PO TABS: ORAL_TABLET | ORAL | 1 refills | Status: DC

## 2016-06-24 MED ORDER — ALTEPLASE 2 MG IJ SOLR
2.0000 mg | Freq: Once | INTRAMUSCULAR | Status: AC | PRN
Start: 2016-06-24 — End: 2016-06-24
  Administered 2016-06-24: 2 mg
  Filled 2016-06-24: qty 2

## 2016-06-24 MED ORDER — SODIUM CHLORIDE 0.9 % IJ SOLN
10.0000 mL | INTRAMUSCULAR | Status: DC | PRN
  Administered 2016-06-24: 10 mL via INTRAVENOUS
  Filled 2016-06-24: qty 10

## 2016-06-24 NOTE — Patient Instructions (Signed)

## 2016-06-24 NOTE — Telephone Encounter (Signed)
[facility] rx sent [facility], called in xanax to [facility]

## 2016-06-24 NOTE — Telephone Encounter (Signed)
Gave patient avs report and appointments for November. [facility] will call re scan.  °

## 2016-06-24 NOTE — Progress Notes (Signed)
Port-Cath accessed, no blood return noted. No swelling or pain noted upon flushing.  Site flushes well with saline. Cath-Flo instilled at [id number] per policy.

## 2016-06-24 NOTE — Progress Notes (Addendum)
[facility] PROGRESS NOTE   Diagnosis:  Rectal cancer  INTERVAL HISTORY:   Mr. Ludvigsen returns as scheduled. He continues every other week Xeloda. He denies nausea/vomiting. No mouth sores. No diarrhea. Palms continue to be mildly dry. He notes soreness involving a right finger joint. Neuropathy symptoms continue to be improved with amitriptyline.  Objective:  Vital signs in last 24 hours:  Blood pressure (!) 152/85, pulse (!) 55, temperature 98.2 F (36.8 C), temperature source Oral, resp. rate 18, height 6' (1.829 m), weight 241 lb 12.8 oz (109.7 kg), SpO2 99 %.    HEENT: No thrush or ulcers. Resp: Lungs clear bilaterally. Cardio: Regular rate and rhythm. GI: Abdomen soft and nontender. No hepatomegaly. Right lower quadrant urostomy. Left lower quadrant colostomy. Vascular: Trace lower leg edema bilaterally. Skin: Palms with mild erythema, dryness. Musculoskeletal: Right middle finger MCP joint is edematous, tender, no erythema.  Port-A-Cath without erythema.  Lab Results:  Lab Results  Component Value Date   WBC 8.4 05/13/2016   HGB 15.2 05/13/2016   HCT 42.0 05/13/2016   MCV 96.6 05/13/2016   PLT 167 05/13/2016   NEUTROABS 5.5 05/13/2016    Imaging:  No results found.  Medications: I have reviewed the patient's current medications.  Assessment/Plan: 1. Clinical stage IV (O7H,Q1F,X5) adenocarcinoma of the rectum with tumor directly extending to the bladder/prostate and CT scan evidence of metastatic chest adenopathy  Positive K-ras mutation.   Normal mismatch repair protein expression. Microsatellite stable   Status post low anterior resection and end colostomy with a cystoprostatectomy and colon conduit urinary diversion 11/01/2013.   Staging PET scan with hypermetabolic pelvic nodes, mediastinal nodes, left adrenal metastasis, and left upper lobe nodule.   Initiation of FOLFOX 12/25/2013.   Restaging CT 03/16/2014 after 6  cycles of FOLFOX confirmed improvement in chest/pelvic lymphadenopathy, a left upper lobe nodule, and resolution of a left adrenal nodule   Oxaliplatin deleted from cycle 8 FOLFOX 04/02/2014 secondary to neuropathy and thrombocytopenia.   Oxaliplatin held with cycle 9 FOLFOX 04/16/2014 due to neuropathy.   Cycle 10 FOLFOX 04/30/2014.   Cycle 11 FOLFOX 05/15/2014.   Cycle 12 FOLFOX 05/28/2014. Oxaliplatin held due to increased neuropathy.   Restaging CT evaluation 06/08/2014 with stable mediastinal and hilar adenopathy. Stable borderline left iliac lymph node. No new or progressive disease identified within the chest, abdomen or pelvis.   "Maintenance" 5-fluorouracil beginning 06/11/2014.   Restaging CT evaluation 10/10/2014 showed similar mild mediastinal and right hilar adenopathy. No visible recurrence of the prior left upper lobe metastatic lesion or the left adrenal metastatic lesion. No new lesions noted.   Maintenance Xeloda on a 7 day on/7 day off schedule beginning 10/25/2014   Restaging CTs 03/04/2015 with stable mediastinal lymph nodes and no evidence of metastatic disease   Xeloda continued   Xeloda dose reduced beginning 04/15/2015 due to progressive hand-foot syndrome.   Xeloda placed on hold 05/15/2015 due to continued hand/foot pain.   Xeloda resumed 06/10/2015.   Restaging CTs 08/09/2015 with no evidence of disease progression   Continuation of maintenance Xeloda 2. Microcytic anemia-likely iron deficiency, improved.  3. Gout. 4. Port-A-Cath placement 12/18/2013.  5. Anxiety. He takes Xanax as needed. 6. History of left knee pain and swelling. 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 Trial of Cymbalta initiated 07/09/2014. Trial of amitriptyline initiated 12/11/2015. 8. History of Thrombocytopenia secondary to chemotherapy. 9. Skin rash 04/30/2014-resolved with  doxycycline 10.  Chronic edema left lower leg/ankle. Negative Doppler 09/17/2014 11. Hand-foot syndrome secondary to Xeloda. Improved. 12. Enlargement of the left testicle 08/13/2015. He has been evaluated by urology. 13. Hypertension. Norvasc prescribed 05/13/2016. Reports not taking 06/24/2016.   Disposition: Mr. Brannan appears stable. There is no clinical evidence of disease progression. Plan to continue Xeloda 1 week on/1 week off. We will follow-up on the labs from today. We are referring him for restaging CT scans a few days prior to his next visit in 6 weeks.  The pain and swelling at the right middle finger MCP joint appears more consistent with arthritis than a gout flare. He will try Tylenol. If symptoms persist and/or worsen he understands to contact the office.  He will return for a follow-up visit in 6 weeks. He will contact the office in the interim with any problems.  Patient seen with Dr. Benay Spice.  [name] ANP/GNP-BC   06/24/2016  9:49 AM This was a shared visit with [name]. He appears to have inflammatory arthritis at the right third MCP joint. He will continue Xeloda.  Julieanne Manson, M.D.

## 2016-06-24 NOTE — Progress Notes (Signed)
Cath-flo checked at 0950, adequate blood return. 10cc of waste drawn and discarded. Labs drawn from port and flushed with saline and heparin, de-accessed with no issues.

## 2016-06-25 LAB — CEA: CEA: 3.8 ng/mL (ref 0.0–4.7)

## 2016-07-02 ENCOUNTER — Telehealth: Payer: Self-pay | Admitting: *Deleted

## 2016-07-02 NOTE — Telephone Encounter (Signed)
Received call from wife stating that pt is due to restart Xeloda on Friday 07/03/16.  However, pt still has not received Xeloda meds.   Spoke with Psychologist, clinical, and clarifiedy dosage as per escript sent 06/24/16.    Per technician, script was marked as STAT for delivery to pt either today or tomorrow.  Pt will be contacted by [facility] to set up delivery. Spoke with wife Karna Christmas, and informed her of above info.  Terri voiced understanding. Terri's    Phone     [phone].

## 2016-07-15 ENCOUNTER — Telehealth: Payer: Self-pay | Admitting: *Deleted

## 2016-07-15 NOTE — Telephone Encounter (Signed)
Message received this morning from patient's wife stating they have not received appt time for CT scan planned for the end of this month.  Call placed back to patient to confirm time of CT and he stated that he was aware of the time and that he and his wife had a "miscommunication" regarding the scheduling of the CT.  Appointment times reviewed with patient and patient has no questions at this time.

## 2016-07-15 NOTE — Telephone Encounter (Signed)
"  We called, left a message that we've not heard from [facility] about scans.  Please disregard this message.  [facility] did not let me know or write it on the schedule."

## 2016-07-22 ENCOUNTER — Other Ambulatory Visit: Payer: Self-pay | Admitting: *Deleted

## 2016-07-22 DIAGNOSIS — C20 Malignant neoplasm of rectum: Secondary | ICD-10-CM

## 2016-07-22 MED ORDER — CAPECITABINE 500 MG PO TABS: ORAL_TABLET | ORAL | 0 refills | Status: DC

## 2016-07-29 ENCOUNTER — Other Ambulatory Visit: Payer: Self-pay | Admitting: *Deleted

## 2016-07-29 ENCOUNTER — Telehealth: Payer: Self-pay

## 2016-07-29 NOTE — Telephone Encounter (Signed)
Call placed to [facility] to check on patient's Xeloda prescription.  They are calling patient now to confirm address with patient and overnight Xeloda to patient.

## 2016-07-29 NOTE — Telephone Encounter (Signed)
Wife called to say they have not heard from [facility]. The pt is supposed to start his capecitabine on Sunday.

## 2016-08-03 ENCOUNTER — Ambulatory Visit: Payer: BLUE CROSS/BLUE SHIELD

## 2016-08-03 ENCOUNTER — Other Ambulatory Visit (HOSPITAL_BASED_OUTPATIENT_CLINIC_OR_DEPARTMENT_OTHER): Payer: BLUE CROSS/BLUE SHIELD

## 2016-08-03 ENCOUNTER — Ambulatory Visit (HOSPITAL_COMMUNITY)
Admission: RE | Admit: 2016-08-03 | Discharge: 2016-08-03 | Disposition: A | Discharge status: Home / Self Care | Payer: BLUE CROSS/BLUE SHIELD | Source: Ambulatory Visit | Attending: Nurse Practitioner | Admitting: Nurse Practitioner

## 2016-08-03 ENCOUNTER — Encounter (HOSPITAL_COMMUNITY): Payer: Self-pay

## 2016-08-03 DIAGNOSIS — I700 Atherosclerosis of aorta: Secondary | ICD-10-CM | POA: Insufficient documentation

## 2016-08-03 DIAGNOSIS — C20 Malignant neoplasm of rectum: Secondary | ICD-10-CM

## 2016-08-03 DIAGNOSIS — K760 Fatty (change of) liver, not elsewhere classified: Secondary | ICD-10-CM | POA: Insufficient documentation

## 2016-08-03 DIAGNOSIS — I2510 Atherosclerotic heart disease of native coronary artery without angina pectoris: Secondary | ICD-10-CM | POA: Diagnosis not present

## 2016-08-03 DIAGNOSIS — Z933 Colostomy status: Secondary | ICD-10-CM | POA: Insufficient documentation

## 2016-08-03 LAB — CBC WITH DIFFERENTIAL/PLATELET
BASO%: 0.8 % (ref 0.0–2.0)
BASOS ABS: 0.1 10*3/uL (ref 0.0–0.1)
EOS%: 4.5 % (ref 0.0–7.0)
Eosinophils Absolute: 0.3 10*3/uL (ref 0.0–0.5)
HEMATOCRIT: 46.2 % (ref 38.4–49.9)
HEMOGLOBIN: 16.2 g/dL (ref 13.0–17.1)
LYMPH#: 2 10*3/uL (ref 0.9–3.3)
LYMPH%: 29 % (ref 14.0–49.0)
MCH: 35.1 pg — AB (ref 27.2–33.4)
MCHC: 35.1 g/dL (ref 32.0–36.0)
MCV: 100.1 fL — ABNORMAL HIGH (ref 79.3–98.0)
MONO#: 0.6 10*3/uL (ref 0.1–0.9)
MONO%: 8.4 % (ref 0.0–14.0)
NEUT#: 4 10*3/uL (ref 1.5–6.5)
NEUT%: 57.3 % (ref 39.0–75.0)
PLATELETS: 168 10*3/uL (ref 140–400)
RBC: 4.62 10*6/uL (ref 4.20–5.82)
RDW: 15.4 % — AB (ref 11.0–14.6)
WBC: 7 10*3/uL (ref 4.0–10.3)

## 2016-08-03 LAB — COMPREHENSIVE METABOLIC PANEL
ALBUMIN: 3.5 g/dL (ref 3.5–5.0)
ALK PHOS: 82 U/L (ref 40–150)
ALT: 22 U/L (ref 0–55)
ANION GAP: 12 meq/L — AB (ref 3–11)
AST: 20 U/L (ref 5–34)
BUN: 10.5 mg/dL (ref 7.0–26.0)
CALCIUM: 9.1 mg/dL (ref 8.4–10.4)
CHLORIDE: 109 meq/L (ref 98–109)
CO2: 18 mEq/L — ABNORMAL LOW (ref 22–29)
CREATININE: 0.8 mg/dL (ref 0.7–1.3)
EGFR: >90 mL/min/{1.73_m2} (ref >90)
Glucose: 106 mg/dl (ref 70–140)
POTASSIUM: 4.3 meq/L (ref 3.5–5.1)
Sodium: 140 mEq/L (ref 136–145)
Total Bilirubin: 0.44 mg/dL (ref 0.20–1.20)
Total Protein: 7.2 g/dL (ref 6.4–8.3)

## 2016-08-03 LAB — CEA (IN HOUSE-CHCC): CEA (CHCC-IN HOUSE): 3.4 ng/mL (ref 0.00–5.00)

## 2016-08-03 MED ORDER — SODIUM CHLORIDE 0.9 % IJ SOLN
INTRAMUSCULAR | Status: AC
  Filled 2016-08-03: qty 50

## 2016-08-03 MED ORDER — HEPARIN SOD (PORK) LOCK FLUSH 100 UNIT/ML IV SOLN
INTRAVENOUS | Status: AC
  Filled 2016-08-03: qty 5

## 2016-08-03 MED ORDER — IOPAMIDOL (ISOVUE-300) INJECTION 61%
100.0000 mL | Freq: Once | INTRAVENOUS | Status: AC | PRN
  Administered 2016-08-03: 100 mL via INTRAVENOUS

## 2016-08-03 MED ORDER — IOPAMIDOL (ISOVUE-300) INJECTION 61%
INTRAVENOUS | Status: AC
  Filled 2016-08-03: qty 100

## 2016-08-05 ENCOUNTER — Ambulatory Visit (HOSPITAL_BASED_OUTPATIENT_CLINIC_OR_DEPARTMENT_OTHER): Payer: BLUE CROSS/BLUE SHIELD | Admitting: Oncology

## 2016-08-05 ENCOUNTER — Telehealth: Payer: Self-pay | Admitting: Oncology

## 2016-08-05 VITALS — BP 159/92 | HR 62 | Temp 98.3°F | Resp 17 | Ht 72.0 in | Wt 246.6 lb

## 2016-08-05 DIAGNOSIS — I10 Essential (primary) hypertension: Secondary | ICD-10-CM | POA: Diagnosis not present

## 2016-08-05 DIAGNOSIS — C20 Malignant neoplasm of rectum: Secondary | ICD-10-CM

## 2016-08-05 DIAGNOSIS — D509 Iron deficiency anemia, unspecified: Secondary | ICD-10-CM

## 2016-08-05 DIAGNOSIS — Z23 Encounter for immunization: Secondary | ICD-10-CM | POA: Diagnosis not present

## 2016-08-05 MED ORDER — CAPECITABINE 500 MG PO TABS: ORAL_TABLET | ORAL | 0 refills | Status: DC

## 2016-08-05 MED ORDER — ALPRAZOLAM 1 MG PO TABS: ORAL_TABLET | ORAL | 0 refills | Status: DC

## 2016-08-05 MED ORDER — INFLUENZA VAC SPLIT QUAD 0.5 ML IM SUSY
0.5000 mL | PREFILLED_SYRINGE | Freq: Once | INTRAMUSCULAR | Status: AC
  Administered 2016-08-05: 0.5 mL via INTRAMUSCULAR
  Filled 2016-08-05: qty 0.5

## 2016-08-05 NOTE — Telephone Encounter (Signed)
Appointments scheduled per 08/05/16 los. A copy of the AVS report and appointment schedule was given to the patient, per 08/05/16 los. °

## 2016-08-05 NOTE — Addendum Note (Signed)
Addended by: Brien Few on: 08/05/2016 05:16 PM   Modules accepted: Orders

## 2016-08-05 NOTE — Progress Notes (Signed)
[facility] OFFICE PROGRESS NOTE   Diagnosis: Colon cancer  INTERVAL HISTORY:   Austin Robbins returns as scheduled. He continues every other week Xeloda. He will complete the current week of Xeloda on 08/08/2016. No mouth sores, diarrhea, or hand/foot pain. He is working.   Objective:  Vital signs in last 24 hours:  Blood pressure (!) 159/92, pulse 62, temperature 98.3 F (36.8 C), temperature source Oral, resp. rate 17, height 6' (1.829 m), weight 246 lb 9.6 oz (111.9 kg), SpO2 99 %.    HEENT: No thrush or ulcers Lymphatics: No cervical, supra-clavicular, axillary, or inguinal nodes. Prominent left axillary fat pad. Resp: Lungs clear bilaterally Cardio: Regular rate and rhythm GI: No hepatomegaly, no mass, right lower quadrant urostomy, left lower quadrant colostomy Vascular: No leg edema  Skin: Dry skin of the hands, mild paronychia at the right hand   Portacath/PICC-without erythema  Lab Results:  Lab Results  Component Value Date   WBC 7.0 08/03/2016   HGB 16.2 08/03/2016   HCT 46.2 08/03/2016   MCV 100.1 (H) 08/03/2016   PLT 168 08/03/2016   NEUTROABS 4.0 08/03/2016    CEA-3.4  Imaging:  Ct Chest W Contrast  Result Date: 08/03/2016 CLINICAL DATA:  Restaging rectal adenocarcinoma, on chemotherapy. EXAM: CT CHEST, ABDOMEN, AND PELVIS WITH CONTRAST TECHNIQUE: Multidetector CT imaging of the chest, abdomen and pelvis was performed following the standard protocol during bolus administration of intravenous contrast. CONTRAST:  165m ISOVUE-300 IOPAMIDOL (ISOVUE-300) INJECTION 61% COMPARISON:  08/09/2015. FINDINGS: CT CHEST FINDINGS Cardiovascular: Left IJ Port-A-Cath terminates in the SVC. Atherosclerotic calcification of the arterial vasculature, including coronary arteries. Heart size normal. No pericardial effusion. Mediastinum/Nodes: No pathologically enlarged mediastinal, hilar or axillary lymph nodes. Esophagus is grossly unremarkable. Lungs/Pleura:  Minimal biapical pleural parenchymal scarring. Lungs are otherwise clear. No pleural fluid. Airway is unremarkable. Musculoskeletal: No worrisome lytic or sclerotic lesions. CT ABDOMEN PELVIS FINDINGS Hepatobiliary: Liver appears slightly decreased in attenuation diffusely. Liver measures 21.1 cm. Gallbladder is unremarkable. No biliary ductal dilatation. Pancreas: Negative. Spleen: Negative. Adrenals/Urinary Tract: Adrenal glands and kidneys are unremarkable. Ureters are decompressed. Cystoprostatectomy with a right lower quadrant urostomy. Stomach/Bowel: Stomach, small bowel, appendix and proximal colon are unremarkable. Left lower quadrant colostomy and low anterior resection. Vascular/Lymphatic: Atherosclerotic calcification of the arterial vasculature. No aneurysm. No pathologically enlarged lymph nodes. Reproductive: Cystoprostatectomy. Other: No free fluid.  Mesenteries and peritoneum are unremarkable. Musculoskeletal: No worrisome lytic or sclerotic lesions. IMPRESSION: 1. Left lower quadrant colostomy and low anterior resection without evidence of metastatic disease. 2. Cystoprostatectomy with right lower quadrant urostomy. 3. Fatty enlarged liver. 4. Aortic atherosclerosis (ICD10-170.0). Coronary artery calcification. Electronically Signed   By: MLorin PicketM.D.   On: 08/03/2016 11:24   Ct Abdomen Pelvis W Contrast  Result Date: 08/03/2016 CLINICAL DATA:  Restaging rectal adenocarcinoma, on chemotherapy. EXAM: CT CHEST, ABDOMEN, AND PELVIS WITH CONTRAST TECHNIQUE: Multidetector CT imaging of the chest, abdomen and pelvis was performed following the standard protocol during bolus administration of intravenous contrast. CONTRAST:  1032mISOVUE-300 IOPAMIDOL (ISOVUE-300) INJECTION 61% COMPARISON:  08/09/2015. FINDINGS: CT CHEST FINDINGS Cardiovascular: Left IJ Port-A-Cath terminates in the SVC. Atherosclerotic calcification of the arterial vasculature, including coronary arteries. Heart size normal.  No pericardial effusion. Mediastinum/Nodes: No pathologically enlarged mediastinal, hilar or axillary lymph nodes. Esophagus is grossly unremarkable. Lungs/Pleura: Minimal biapical pleural parenchymal scarring. Lungs are otherwise clear. No pleural fluid. Airway is unremarkable. Musculoskeletal: No worrisome lytic or sclerotic lesions. CT ABDOMEN PELVIS FINDINGS Hepatobiliary: Liver appears slightly  decreased in attenuation diffusely. Liver measures 21.1 cm. Gallbladder is unremarkable. No biliary ductal dilatation. Pancreas: Negative. Spleen: Negative. Adrenals/Urinary Tract: Adrenal glands and kidneys are unremarkable. Ureters are decompressed. Cystoprostatectomy with a right lower quadrant urostomy. Stomach/Bowel: Stomach, small bowel, appendix and proximal colon are unremarkable. Left lower quadrant colostomy and low anterior resection. Vascular/Lymphatic: Atherosclerotic calcification of the arterial vasculature. No aneurysm. No pathologically enlarged lymph nodes. Reproductive: Cystoprostatectomy. Other: No free fluid.  Mesenteries and peritoneum are unremarkable. Musculoskeletal: No worrisome lytic or sclerotic lesions. IMPRESSION: 1. Left lower quadrant colostomy and low anterior resection without evidence of metastatic disease. 2. Cystoprostatectomy with right lower quadrant urostomy. 3. Fatty enlarged liver. 4. Aortic atherosclerosis (ICD10-170.0). Coronary artery calcification. Electronically Signed   By: Lorin Picket M.D.   On: 08/03/2016 11:24    Medications: I have reviewed the patient's current medications.  Assessment/Plan: 1. Clinical stage IV (P3X,T0W,I0) adenocarcinoma of the rectum with tumor directly extending to the bladder/prostate and CT scan evidence of metastatic chest adenopathy  Positive K-ras mutation.   Normal mismatch repair protein expression. Microsatellite stable   Status post low anterior resection and end colostomy with a cystoprostatectomy and colon conduit  urinary diversion 11/01/2013.   Staging PET scan with hypermetabolic pelvic nodes, mediastinal nodes, left adrenal metastasis, and left upper lobe nodule.   Initiation of FOLFOX 12/25/2013.   Restaging CT 03/16/2014 after 6 cycles of FOLFOX confirmed improvement in chest/pelvic lymphadenopathy, a left upper lobe nodule, and resolution of a left adrenal nodule   Oxaliplatin deleted from cycle 8 FOLFOX 04/02/2014 secondary to neuropathy and thrombocytopenia.   Oxaliplatin held with cycle 9 FOLFOX 04/16/2014 due to neuropathy.   Cycle 10 FOLFOX 04/30/2014.   Cycle 11 FOLFOX 05/15/2014.   Cycle 12 FOLFOX 05/28/2014. Oxaliplatin held due to increased neuropathy.   Restaging CT evaluation 06/08/2014 with stable mediastinal and hilar adenopathy. Stable borderline left iliac lymph node. No new or progressive disease identified within the chest, abdomen or pelvis.   "Maintenance" 5-fluorouracil beginning 06/11/2014.   Restaging CT evaluation 10/10/2014 showed similar mild mediastinal and right hilar adenopathy. No visible recurrence of the prior left upper lobe metastatic lesion or the left adrenal metastatic lesion. No new lesions noted.   Maintenance Xeloda on a 7 day on/7 day off schedule beginning 10/25/2014   Restaging CTs 03/04/2015 with stable mediastinal lymph nodes and no evidence of metastatic disease   Xeloda continued   Xeloda dose reduced beginning 04/15/2015 due to progressive hand-foot syndrome.   Xeloda placed on hold 05/15/2015 due to continued hand/foot pain.   Xeloda resumed 06/10/2015.   Restaging CTs 08/09/2015 with no evidence of disease progression   Continuation of maintenance Xeloda  CTs 08/03/2016-no evidence of metastatic disease, no pathologic chest lymphadenopathy 2. Microcytic anemia-likely iron deficiency, improved.  3. Gout. 4. Port-A-Cath placement 12/18/2013.  5. Anxiety. He takes Xanax as needed. 6. History of left knee pain  and swelling. 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 Trial of Cymbalta initiated 07/09/2014. Trial of amitriptyline initiated 12/11/2015. 8. History of Thrombocytopenia secondary to chemotherapy. 9. Skin rash 04/30/2014-resolved with doxycycline 10. Chronic edema left lower leg/ankle. Negative Doppler 09/17/2014 11. Hand-foot syndrome secondary to Xeloda. Improved. 12. Enlargement of the left testicle 08/13/2015. He has been evaluated by urology. 13. Hypertension. Norvasc prescribed 05/13/2016. Reports not taking 06/24/2016.    Disposition:  Austin Robbins remains in clinical remission from colon cancer. We discussed the risk/benefit of continuing Xeloda with Mr.  Robbins and his wife. The plan is to continue Xeloda on the current schedule. He will return for an office and lab visit in 6 weeks.  He received an influenza vaccine today.  SHERRILL, GARY, MD  08/05/2016  9:34 AM   

## 2016-08-06 ENCOUNTER — Other Ambulatory Visit: Payer: Self-pay | Admitting: Nurse Practitioner

## 2016-08-06 DIAGNOSIS — C20 Malignant neoplasm of rectum: Secondary | ICD-10-CM

## 2016-08-06 DIAGNOSIS — M109 Gout, unspecified: Secondary | ICD-10-CM

## 2016-09-16 ENCOUNTER — Ambulatory Visit (HOSPITAL_BASED_OUTPATIENT_CLINIC_OR_DEPARTMENT_OTHER): Payer: BLUE CROSS/BLUE SHIELD

## 2016-09-16 ENCOUNTER — Telehealth: Payer: Self-pay | Admitting: Nurse Practitioner

## 2016-09-16 ENCOUNTER — Ambulatory Visit (HOSPITAL_BASED_OUTPATIENT_CLINIC_OR_DEPARTMENT_OTHER): Payer: BLUE CROSS/BLUE SHIELD | Admitting: Nurse Practitioner

## 2016-09-16 ENCOUNTER — Other Ambulatory Visit: Payer: Self-pay

## 2016-09-16 ENCOUNTER — Telehealth: Payer: Self-pay

## 2016-09-16 ENCOUNTER — Other Ambulatory Visit (HOSPITAL_BASED_OUTPATIENT_CLINIC_OR_DEPARTMENT_OTHER): Payer: BLUE CROSS/BLUE SHIELD

## 2016-09-16 VITALS — BP 158/90 | HR 74 | Temp 98.5°F | Resp 18 | Ht 72.0 in | Wt 242.3 lb

## 2016-09-16 DIAGNOSIS — C20 Malignant neoplasm of rectum: Secondary | ICD-10-CM | POA: Diagnosis not present

## 2016-09-16 DIAGNOSIS — I10 Essential (primary) hypertension: Secondary | ICD-10-CM

## 2016-09-16 DIAGNOSIS — Z452 Encounter for adjustment and management of vascular access device: Secondary | ICD-10-CM

## 2016-09-16 DIAGNOSIS — M109 Gout, unspecified: Secondary | ICD-10-CM | POA: Diagnosis not present

## 2016-09-16 DIAGNOSIS — F419 Anxiety disorder, unspecified: Secondary | ICD-10-CM

## 2016-09-16 DIAGNOSIS — D509 Iron deficiency anemia, unspecified: Secondary | ICD-10-CM | POA: Diagnosis not present

## 2016-09-16 LAB — COMPREHENSIVE METABOLIC PANEL
ALBUMIN: 3.7 g/dL (ref 3.5–5.0)
ALK PHOS: 84 U/L (ref 40–150)
ALT: 29 U/L (ref 0–55)
ANION GAP: 9 meq/L (ref 3–11)
AST: 26 U/L (ref 5–34)
BILIRUBIN TOTAL: 0.79 mg/dL (ref 0.20–1.20)
BUN: 9.9 mg/dL (ref 7.0–26.0)
CALCIUM: 9.1 mg/dL (ref 8.4–10.4)
CHLORIDE: 105 meq/L (ref 98–109)
CO2: 23 mEq/L (ref 22–29)
CREATININE: 0.9 mg/dL (ref 0.7–1.3)
EGFR: >90 mL/min/{1.73_m2} (ref >90)
Glucose: 106 mg/dl (ref 70–140)
Potassium: 4 mEq/L (ref 3.5–5.1)
Sodium: 138 mEq/L (ref 136–145)
TOTAL PROTEIN: 7 g/dL (ref 6.4–8.3)

## 2016-09-16 LAB — CBC WITH DIFFERENTIAL/PLATELET
BASO%: 0.3 % (ref 0.0–2.0)
Basophils Absolute: 0 10*3/uL (ref 0.0–0.1)
EOS ABS: 0.2 10*3/uL (ref 0.0–0.5)
EOS%: 3 % (ref 0.0–7.0)
HEMATOCRIT: 45 % (ref 38.4–49.9)
HEMOGLOBIN: 16.1 g/dL (ref 13.0–17.1)
LYMPH%: 19 % (ref 14.0–49.0)
MCH: 34.7 pg — ABNORMAL HIGH (ref 27.2–33.4)
MCHC: 35.8 g/dL (ref 32.0–36.0)
MCV: 97 fL (ref 79.3–98.0)
MONO#: 0.7 10*3/uL (ref 0.1–0.9)
MONO%: 12.2 % (ref 0.0–14.0)
NEUT#: 3.8 10*3/uL (ref 1.5–6.5)
NEUT%: 65.5 % (ref 39.0–75.0)
PLATELETS: 143 10*3/uL (ref 140–400)
RBC: 4.64 10*6/uL (ref 4.20–5.82)
RDW: 14.5 % (ref 11.0–14.6)
WBC: 5.8 10*3/uL (ref 4.0–10.3)
lymph#: 1.1 10*3/uL (ref 0.9–3.3)
nRBC: 0 % (ref 0–0)

## 2016-09-16 MED ORDER — SODIUM CHLORIDE 0.9 % IJ SOLN
10.0000 mL | INTRAMUSCULAR | Status: DC | PRN
Start: 2016-09-16 — End: 2016-09-16
  Administered 2016-09-16: 10 mL via INTRAVENOUS
  Filled 2016-09-16: qty 10

## 2016-09-16 MED ORDER — HEPARIN SOD (PORK) LOCK FLUSH 100 UNIT/ML IV SOLN
500.0000 [IU] | Freq: Once | INTRAVENOUS | Status: DC | PRN
  Administered 2016-09-16: 500 [IU] via INTRAVENOUS
  Filled 2016-09-16: qty 5

## 2016-09-16 MED ORDER — CAPECITABINE 500 MG PO TABS: ORAL_TABLET | ORAL | 0 refills | Status: DC

## 2016-09-16 MED ORDER — INDOMETHACIN 50 MG PO CAPS: 50.0000 mg | ORAL_CAPSULE | Freq: Three times a day (TID) | ORAL | 0 refills | Status: DC | PRN

## 2016-09-16 MED ORDER — ALTEPLASE 2 MG IJ SOLR
2.0000 mg | Freq: Once | INTRAMUSCULAR | Status: AC | PRN
  Administered 2016-09-16: 2 mg
  Filled 2016-09-16: qty 2

## 2016-09-16 NOTE — Telephone Encounter (Signed)
Called patients dentist to confirm pt no on bisphosphonates, pt on chemotherapy but is ok for tooth extraction per Dr. Hillery Aldo. Faxed med list per dentist request to [phone]

## 2016-09-16 NOTE — Progress Notes (Signed)
[place] OFFICE PROGRESS NOTE   Diagnosis:  Colon cancer  INTERVAL HISTORY:   Mr. Laurich returns as scheduled. He continues every other week Xeloda. He denies nausea/vomiting. No mouth sores. No diarrhea. He notes palms are dry. No pain. He reports a tooth extraction is planned for a "deep cavity". He had a gout flare last week. He needs a refill on Indocin.  Objective:  Vital signs in last 24 hours:  Blood pressure (!) 158/90, pulse 74, temperature 98.5 F (36.9 C), temperature source Oral, resp. rate 18, height 6' (1.829 m), weight 242 lb 4.8 oz (109.9 kg), SpO2 99 %.    HEENT: No thrush or ulcers. Resp: Lungs clear bilaterally. Cardio: Regular rate and rhythm. GI: Abdomen soft and nontender. No hepatomegaly. Right lower quadrant urostomy. Left lower quadrant colostomy. Vascular: No leg edema. Skin: Palms with dry skin, mild erythema. Port-A-Cath without erythema.    Lab Results:  Lab Results  Component Value Date   WBC 7.0 08/03/2016   HGB 16.2 08/03/2016   HCT 46.2 08/03/2016   MCV 100.1 (H) 08/03/2016   PLT 168 08/03/2016   NEUTROABS 4.0 08/03/2016    Imaging:  No results found.  Medications: I have reviewed the patient's current medications.  Assessment/Plan: 1. Clinical stage IV (Z6X,W9U,E4) adenocarcinoma of the rectum with tumor directly extending to the bladder/prostate and CT scan evidence of metastatic chest adenopathy  Positive K-ras mutation.   Normal mismatch repair protein expression. Microsatellite stable   Status post low anterior resection and end colostomy with a cystoprostatectomy and colon conduit urinary diversion 11/01/2013.   Staging PET scan with hypermetabolic pelvic nodes, mediastinal nodes, left adrenal metastasis, and left upper lobe nodule.   Initiation of FOLFOX 12/25/2013.   Restaging CT 03/16/2014 after 6 cycles of FOLFOX confirmed improvement in chest/pelvic lymphadenopathy, a left upper lobe nodule,  and resolution of a left adrenal nodule   Oxaliplatin deleted from cycle 8 FOLFOX 04/02/2014 secondary to neuropathy and thrombocytopenia.   Oxaliplatin held with cycle 9 FOLFOX 04/16/2014 due to neuropathy.   Cycle 10 FOLFOX 04/30/2014.   Cycle 11 FOLFOX 05/15/2014.   Cycle 12 FOLFOX 05/28/2014. Oxaliplatin held due to increased neuropathy.   Restaging CT evaluation 06/08/2014 with stable mediastinal and hilar adenopathy. Stable borderline left iliac lymph node. No new or progressive disease identified within the chest, abdomen or pelvis.   "Maintenance" 5-fluorouracil beginning 06/11/2014.   Restaging CT evaluation 10/10/2014 showed similar mild mediastinal and right hilar adenopathy. No visible recurrence of the prior left upper lobe metastatic lesion or the left adrenal metastatic lesion. No new lesions noted.   Maintenance Xeloda on a 7 day on/7 day off schedule beginning 10/25/2014   Restaging CTs 03/04/2015 with stable mediastinal lymph nodes and no evidence of metastatic disease   Xeloda continued   Xeloda dose reduced beginning 04/15/2015 due to progressive hand-foot syndrome.   Xeloda placed on hold 05/15/2015 due to continued hand/foot pain.   Xeloda resumed 06/10/2015.   Restaging CTs 08/09/2015 with no evidence of disease progression   Continuation of maintenance Xeloda  CTs 08/03/2016-no evidence of metastatic disease, no pathologic chest lymphadenopathy  Continuation of maintenance Xeloda 2. Microcytic anemia-likely iron deficiency, improved.  3. Gout. 4. Port-A-Cath placement 12/18/2013.  5. Anxiety. He takes Xanax as needed. 6. History of left knee pain and swelling. 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 Trial of Cymbalta initiated 07/09/2014. Trial of amitriptyline initiated 12/11/2015. 8.  History of Thrombocytopenia secondary to chemotherapy. 9. Skin rash  04/30/2014-resolved with doxycycline 10. Chronic edema left lower leg/ankle. Negative Doppler 09/17/2014 11. Hand-foot syndrome secondary to Xeloda. Improved. 12. Enlargement of the left testicle 08/13/2015. He has been evaluated by urology. 13. Hypertension.Norvasc prescribed 05/13/2016. Reports not taking 06/24/2016.   Disposition: Mr. Spradley appears stable. There is no clinical evidence of disease progression. Plan to continue Xeloda 7 days on/7 days off. He will return for labs and a follow-up visit in 6 weeks.  Plan reviewed with Dr. Benay Spice.    [name] ANP/GNP-BC   09/16/2016  9:57 AM

## 2016-09-16 NOTE — Telephone Encounter (Signed)
Appointments scheduled per 1/10 LOS. Patient given AVS report and calendars with future scheduled appointments. °

## 2016-09-16 NOTE — Patient Instructions (Signed)

## 2016-09-18 ENCOUNTER — Other Ambulatory Visit: Payer: Self-pay | Admitting: Nurse Practitioner

## 2016-10-19 ENCOUNTER — Other Ambulatory Visit: Payer: Self-pay | Admitting: *Deleted

## 2016-10-19 DIAGNOSIS — C20 Malignant neoplasm of rectum: Secondary | ICD-10-CM

## 2016-10-19 MED ORDER — AMITRIPTYLINE HCL 25 MG PO TABS: 25.0000 mg | ORAL_TABLET | Freq: Every day | ORAL | 3 refills | Status: DC

## 2016-10-28 ENCOUNTER — Ambulatory Visit: Payer: BLUE CROSS/BLUE SHIELD

## 2016-10-28 ENCOUNTER — Other Ambulatory Visit (HOSPITAL_BASED_OUTPATIENT_CLINIC_OR_DEPARTMENT_OTHER): Payer: BLUE CROSS/BLUE SHIELD

## 2016-10-28 ENCOUNTER — Telehealth: Payer: Self-pay | Admitting: Oncology

## 2016-10-28 ENCOUNTER — Ambulatory Visit (HOSPITAL_BASED_OUTPATIENT_CLINIC_OR_DEPARTMENT_OTHER): Payer: BLUE CROSS/BLUE SHIELD | Admitting: Oncology

## 2016-10-28 DIAGNOSIS — C7972 Secondary malignant neoplasm of left adrenal gland: Secondary | ICD-10-CM

## 2016-10-28 DIAGNOSIS — C20 Malignant neoplasm of rectum: Secondary | ICD-10-CM

## 2016-10-28 DIAGNOSIS — D509 Iron deficiency anemia, unspecified: Secondary | ICD-10-CM | POA: Diagnosis not present

## 2016-10-28 DIAGNOSIS — F419 Anxiety disorder, unspecified: Secondary | ICD-10-CM | POA: Diagnosis not present

## 2016-10-28 DIAGNOSIS — I10 Essential (primary) hypertension: Secondary | ICD-10-CM

## 2016-10-28 LAB — COMPREHENSIVE METABOLIC PANEL
ALBUMIN: 3.7 g/dL (ref 3.5–5.0)
ALK PHOS: 73 U/L (ref 40–150)
ALT: 20 U/L (ref 0–55)
AST: 19 U/L (ref 5–34)
Anion Gap: 7 mEq/L (ref 3–11)
BUN: 12.2 mg/dL (ref 7.0–26.0)
CO2: 22 mEq/L (ref 22–29)
Calcium: 8.8 mg/dL (ref 8.4–10.4)
Chloride: 109 mEq/L (ref 98–109)
Creatinine: 0.9 mg/dL (ref 0.7–1.3)
EGFR: >90 mL/min/{1.73_m2} (ref >90)
GLUCOSE: 100 mg/dL (ref 70–140)
POTASSIUM: 4.3 meq/L (ref 3.5–5.1)
SODIUM: 138 meq/L (ref 136–145)
TOTAL PROTEIN: 6.8 g/dL (ref 6.4–8.3)
Total Bilirubin: 0.7 mg/dL (ref 0.20–1.20)

## 2016-10-28 LAB — CBC WITH DIFFERENTIAL/PLATELET
BASO%: 0.9 % (ref 0.0–2.0)
Basophils Absolute: 0.1 10*3/uL (ref 0.0–0.1)
EOS%: 3.4 % (ref 0.0–7.0)
Eosinophils Absolute: 0.2 10*3/uL (ref 0.0–0.5)
HCT: 43.9 % (ref 38.4–49.9)
HEMOGLOBIN: 15.3 g/dL (ref 13.0–17.1)
LYMPH%: 34 % (ref 14.0–49.0)
MCH: 34.5 pg — ABNORMAL HIGH (ref 27.2–33.4)
MCHC: 34.9 g/dL (ref 32.0–36.0)
MCV: 98.7 fL — ABNORMAL HIGH (ref 79.3–98.0)
MONO#: 0.5 10*3/uL (ref 0.1–0.9)
MONO%: 8.2 % (ref 0.0–14.0)
NEUT%: 53.5 % (ref 39.0–75.0)
NEUTROS ABS: 3.6 10*3/uL (ref 1.5–6.5)
Platelets: 173 10*3/uL (ref 140–400)
RBC: 4.44 10*6/uL (ref 4.20–5.82)
RDW: 14.7 % — AB (ref 11.0–14.6)
WBC: 6.6 10*3/uL (ref 4.0–10.3)
lymph#: 2.3 10*3/uL (ref 0.9–3.3)

## 2016-10-28 LAB — CEA (IN HOUSE-CHCC): CEA (CHCC-In House): 3.17 ng/mL (ref 0.00–5.00)

## 2016-10-28 MED ORDER — ALPRAZOLAM 1 MG PO TABS: ORAL_TABLET | ORAL | 0 refills | Status: DC

## 2016-10-28 MED ORDER — SODIUM CHLORIDE 0.9 % IJ SOLN
10.0000 mL | INTRAMUSCULAR | Status: DC | PRN
  Administered 2016-10-28: 10 mL via INTRAVENOUS
  Filled 2016-10-28: qty 10

## 2016-10-28 MED ORDER — HEPARIN SOD (PORK) LOCK FLUSH 100 UNIT/ML IV SOLN
500.0000 [IU] | Freq: Once | INTRAVENOUS | Status: AC | PRN
  Administered 2016-10-28: 500 [IU] via INTRAVENOUS
  Filled 2016-10-28: qty 5

## 2016-10-28 MED ORDER — AMLODIPINE BESYLATE 5 MG PO TABS: 5.0000 mg | ORAL_TABLET | Freq: Every day | ORAL | 0 refills | Status: DC

## 2016-10-28 MED ORDER — CAPECITABINE 500 MG PO TABS: ORAL_TABLET | ORAL | 0 refills | Status: DC

## 2016-10-28 NOTE — Patient Instructions (Signed)

## 2016-10-28 NOTE — Telephone Encounter (Signed)
Appointments scheduled per 2/21 LOS. Patient given AVS report and calendars with future scheduled appointments. °

## 2016-10-28 NOTE — Progress Notes (Signed)
[facility] OFFICE PROGRESS NOTE   Diagnosis: Colon cancer  INTERVAL HISTORY:   Austin Robbins returns as scheduled. He continues every other week Xeloda. No new complaint. No mouth sores or diarrhea. No hand or foot pain. He is working.   Objective:  Vital signs in last 24 hours:  Blood pressure (!) 160/98, pulse 60, temperature 97.5 F (36.4 C), temperature source Oral, resp. rate 18, height 6' (1.829 m), weight 243 lb 8 oz (110.5 kg), SpO2 99 %.    HEENT: No thrush or ulcers Resp: Lungs clear bilaterally Lymph nodes: No cervical, supraclavicular, axillary, or inguinal nodes Cardio: Regular rate and rhythm GI: No hepatosplenomegaly, no mass, right lower quadrant urostomy, left lower quadrant colostomy Vascular: No leg edema  Skin: Palms without erythema   Portacath/PICC-without erythema  Lab Results:  Lab Results  Component Value Date   WBC 6.6 10/28/2016   HGB 15.3 10/28/2016   HCT 43.9 10/28/2016   MCV 98.7 (H) 10/28/2016   PLT 173 10/28/2016   NEUTROABS 3.6 10/28/2016    Medications: I have reviewed the patient's current medications.  Assessment/Plan: 1. Clinical stage IV (P3I,R5J,O8) adenocarcinoma of the rectum with tumor directly extending to the bladder/prostate and CT scan evidence of metastatic chest adenopathy  Positive K-ras mutation.   Normal mismatch repair protein expression. Microsatellite stable   Status post low anterior resection and end colostomy with a cystoprostatectomy and colon conduit urinary diversion 11/01/2013.   Staging PET scan with hypermetabolic pelvic nodes, mediastinal nodes, left adrenal metastasis, and left upper lobe nodule.   Initiation of FOLFOX 12/25/2013.   Restaging CT 03/16/2014 after 6 cycles of FOLFOX confirmed improvement in chest/pelvic lymphadenopathy, a left upper lobe nodule, and resolution of a left adrenal nodule   Oxaliplatin deleted from cycle 8 FOLFOX 04/02/2014 secondary to neuropathy  and thrombocytopenia.   Oxaliplatin held with cycle 9 FOLFOX 04/16/2014 due to neuropathy.   Cycle 10 FOLFOX 04/30/2014.   Cycle 11 FOLFOX 05/15/2014.   Cycle 12 FOLFOX 05/28/2014. Oxaliplatin held due to increased neuropathy.   Restaging CT evaluation 06/08/2014 with stable mediastinal and hilar adenopathy. Stable borderline left iliac lymph node. No new or progressive disease identified within the chest, abdomen or pelvis.   "Maintenance" 5-fluorouracil beginning 06/11/2014.   Restaging CT evaluation 10/10/2014 showed similar mild mediastinal and right hilar adenopathy. No visible recurrence of the prior left upper lobe metastatic lesion or the left adrenal metastatic lesion. No new lesions noted.   Maintenance Xeloda on a 7 day on/7 day off schedule beginning 10/25/2014   Restaging CTs 03/04/2015 with stable mediastinal lymph nodes and no evidence of metastatic disease   Xeloda continued   Xeloda dose reduced beginning 04/15/2015 due to progressive hand-foot syndrome.   Xeloda placed on hold 05/15/2015 due to continued hand/foot pain.   Xeloda resumed 06/10/2015.   Restaging CTs 08/09/2015 with no evidence of disease progression   Continuation of maintenance Xeloda  CTs 08/03/2016-no evidence of metastatic disease, no pathologic chest lymphadenopathy  Continuation of maintenance Xeloda 2. Microcytic anemia-likely iron deficiency, improved.  3. Gout. 4. Port-A-Cath placement 12/18/2013.  5. Anxiety. He takes Xanax as needed. 6. History of left knee pain and swelling. 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 Trial of Cymbalta initiated 07/09/2014. Trial of amitriptyline initiated 12/11/2015. 8. History of Thrombocytopenia secondary to chemotherapy. 9. Skin rash 04/30/2014-resolved with doxycycline 10. Chronic edema left lower leg/ankle. Negative Doppler 09/17/2014 11. Hand-foot syndrome  secondary to Xeloda. Improved. 12. Enlargement of the left testicle 08/13/2015. He has been evaluated by urology. 13. Hypertension.Norvasc prescribed 05/13/2016. Resumed 10/28/2016    Disposition:  Mr. Luczak appears stable. The plan is to continue every other week Xeloda. He has a large co-pay for the Xeloda. We will check into alternate insurance coverage. He will resume Norvasc for hypertension.  Mr. Delmundo will return for an office visit and Port-A-Cath flush in 5 weeks.    [name], MD  10/28/2016  9:54 AM

## 2016-10-29 ENCOUNTER — Telehealth: Payer: Self-pay | Admitting: *Deleted

## 2016-10-29 NOTE — Telephone Encounter (Signed)
[facility], they've received script. It is under review. Requested they expedite Xeloda script.

## 2016-11-03 ENCOUNTER — Telehealth: Payer: Self-pay | Admitting: *Deleted

## 2016-11-03 NOTE — Telephone Encounter (Signed)
Left message for pt to call office. Has he heard from [place]?

## 2016-11-04 ENCOUNTER — Telehealth: Payer: Self-pay | Admitting: *Deleted

## 2016-11-05 ENCOUNTER — Encounter: Payer: Self-pay | Admitting: *Deleted

## 2016-11-05 NOTE — Telephone Encounter (Signed)
Spoke with pt, he requested I call his wife with [facility] information. Left message on voicemail with [facility] information.

## 2016-11-05 NOTE — Progress Notes (Signed)
Opened in error

## 2016-11-25 ENCOUNTER — Other Ambulatory Visit: Payer: Self-pay | Admitting: *Deleted

## 2016-11-25 NOTE — Telephone Encounter (Signed)
Spoke with pt's wife, she they received Capecitabine from [facility] at a better cost. Refills to be sent there going forward.

## 2016-11-26 ENCOUNTER — Other Ambulatory Visit: Payer: Self-pay | Admitting: *Deleted

## 2016-11-26 DIAGNOSIS — C20 Malignant neoplasm of rectum: Secondary | ICD-10-CM

## 2016-11-26 MED ORDER — CAPECITABINE 500 MG PO TABS: ORAL_TABLET | ORAL | 0 refills | Status: DC

## 2016-12-02 ENCOUNTER — Ambulatory Visit (HOSPITAL_BASED_OUTPATIENT_CLINIC_OR_DEPARTMENT_OTHER): Payer: BLUE CROSS/BLUE SHIELD | Admitting: Nurse Practitioner

## 2016-12-02 ENCOUNTER — Ambulatory Visit: Payer: BLUE CROSS/BLUE SHIELD

## 2016-12-02 ENCOUNTER — Telehealth: Payer: Self-pay | Admitting: Oncology

## 2016-12-02 ENCOUNTER — Other Ambulatory Visit (HOSPITAL_BASED_OUTPATIENT_CLINIC_OR_DEPARTMENT_OTHER): Payer: BLUE CROSS/BLUE SHIELD

## 2016-12-02 ENCOUNTER — Other Ambulatory Visit: Payer: Self-pay | Admitting: *Deleted

## 2016-12-02 VITALS — BP 158/92 | HR 64 | Temp 98.0°F | Resp 18 | Ht 72.0 in | Wt 239.1 lb

## 2016-12-02 DIAGNOSIS — C20 Malignant neoplasm of rectum: Secondary | ICD-10-CM

## 2016-12-02 DIAGNOSIS — D509 Iron deficiency anemia, unspecified: Secondary | ICD-10-CM

## 2016-12-02 DIAGNOSIS — R070 Pain in throat: Secondary | ICD-10-CM | POA: Diagnosis not present

## 2016-12-02 DIAGNOSIS — I10 Essential (primary) hypertension: Secondary | ICD-10-CM

## 2016-12-02 DIAGNOSIS — F419 Anxiety disorder, unspecified: Secondary | ICD-10-CM | POA: Diagnosis not present

## 2016-12-02 DIAGNOSIS — R05 Cough: Secondary | ICD-10-CM

## 2016-12-02 DIAGNOSIS — C7972 Secondary malignant neoplasm of left adrenal gland: Secondary | ICD-10-CM

## 2016-12-02 LAB — CBC WITH DIFFERENTIAL/PLATELET
BASO%: 0.2 % (ref 0.0–2.0)
BASOS ABS: 0 10*3/uL (ref 0.0–0.1)
EOS%: 3.4 % (ref 0.0–7.0)
Eosinophils Absolute: 0.2 10*3/uL (ref 0.0–0.5)
HCT: 44 % (ref 38.4–49.9)
HEMOGLOBIN: 15.6 g/dL (ref 13.0–17.1)
LYMPH#: 2.1 10*3/uL (ref 0.9–3.3)
LYMPH%: 35 % (ref 14.0–49.0)
MCH: 34.4 pg — AB (ref 27.2–33.4)
MCHC: 35.5 g/dL (ref 32.0–36.0)
MCV: 96.9 fL (ref 79.3–98.0)
MONO#: 0.6 10*3/uL (ref 0.1–0.9)
MONO%: 9.8 % (ref 0.0–14.0)
NEUT#: 3.1 10*3/uL (ref 1.5–6.5)
NEUT%: 51.6 % (ref 39.0–75.0)
Platelets: 204 10*3/uL (ref 140–400)
RBC: 4.54 10*6/uL (ref 4.20–5.82)
RDW: 14 % (ref 11.0–14.6)
WBC: 5.9 10*3/uL (ref 4.0–10.3)
nRBC: 0 % (ref 0–0)

## 2016-12-02 LAB — COMPREHENSIVE METABOLIC PANEL
ALBUMIN: 3.7 g/dL (ref 3.5–5.0)
ALK PHOS: 80 U/L (ref 40–150)
ALT: 18 U/L (ref 0–55)
AST: 18 U/L (ref 5–34)
Anion Gap: 9 mEq/L (ref 3–11)
BUN: 11 mg/dL (ref 7.0–26.0)
CO2: 23 mEq/L (ref 22–29)
Calcium: 9.1 mg/dL (ref 8.4–10.4)
Chloride: 107 mEq/L (ref 98–109)
Creatinine: 0.9 mg/dL (ref 0.7–1.3)
GLUCOSE: 124 mg/dL (ref 70–140)
POTASSIUM: 4.3 meq/L (ref 3.5–5.1)
SODIUM: 139 meq/L (ref 136–145)
Total Bilirubin: 0.61 mg/dL (ref 0.20–1.20)
Total Protein: 7 g/dL (ref 6.4–8.3)

## 2016-12-02 LAB — CEA (IN HOUSE-CHCC): CEA (CHCC-IN HOUSE): 2.76 ng/mL (ref 0.00–5.00)

## 2016-12-02 MED ORDER — HEPARIN SOD (PORK) LOCK FLUSH 100 UNIT/ML IV SOLN
500.0000 [IU] | Freq: Once | INTRAVENOUS | Status: AC
  Administered 2016-12-02: 500 [IU] via INTRAVENOUS
  Filled 2016-12-02: qty 5

## 2016-12-02 MED ORDER — SODIUM CHLORIDE 0.9% FLUSH
10.0000 mL | INTRAVENOUS | Status: DC | PRN
  Administered 2016-12-02: 10 mL via INTRAVENOUS
  Filled 2016-12-02: qty 10

## 2016-12-02 MED ORDER — CAPECITABINE 500 MG PO TABS: ORAL_TABLET | ORAL | 0 refills | Status: DC

## 2016-12-02 NOTE — Telephone Encounter (Signed)
Per Lattie Haw, f/u with Dr Benay Spice on 05/09 @ 9:30, per lab in meeting until 8:30. Appointments scheduled per 12/02/16 los. Patient was given a copy of the AVS report and appointment schedule per 12/02/16 los.

## 2016-12-02 NOTE — Progress Notes (Signed)
[facility] OFFICE PROGRESS NOTE   Diagnosis:  Colon cancer  INTERVAL HISTORY:   Austin Robbins returns as scheduled. He continues every other week Xeloda. He denies nausea/vomiting. No mouth sores. No diarrhea. No hand or foot pain or redness. He has had a cough and sore throat for the past few weeks. Symptoms are improving. No fever. No shortness of breath.  He reports he is not taking Norvasc because blood pressure is normal at home.  Objective:  Vital signs in last 24 hours:  Blood pressure (!) 158/92, pulse 64, temperature 98 F (36.7 C), temperature source Oral, resp. rate 18, height 6' (1.829 m), weight 239 lb 1.6 oz (108.5 kg), SpO2 98 %.    HEENT: Posterior palate is erythematous. No ulceration. Resp: Lungs clear bilaterally. Cardio: Regular rate and rhythm. GI: Abdomen soft and nontender. No hepatomegaly. Right lower quadrant urostomy. Left lower quadrant colostomy. Vascular: No leg edema. Skin: Palms without erythema. Port-A-Cath without erythema.    Lab Results:  Lab Results  Component Value Date   WBC 6.6 10/28/2016   HGB 15.3 10/28/2016   HCT 43.9 10/28/2016   MCV 98.7 (H) 10/28/2016   PLT 173 10/28/2016   NEUTROABS 3.6 10/28/2016    Imaging:  No results found.  Medications: I have reviewed the patient's current medications.  Assessment/Plan: 1. Clinical stage IV (U7O,Z3G,U4) adenocarcinoma of the rectum with tumor directly extending to the bladder/prostate and CT scan evidence of metastatic chest adenopathy  Positive K-ras mutation.   Normal mismatch repair protein expression. Microsatellite stable   Status post low anterior resection and end colostomy with a cystoprostatectomy and colon conduit urinary diversion 11/01/2013.   Staging PET scan with hypermetabolic pelvic nodes, mediastinal nodes, left adrenal metastasis, and left upper lobe nodule.   Initiation of FOLFOX 12/25/2013.   Restaging CT 03/16/2014 after 6 cycles of  FOLFOX confirmed improvement in chest/pelvic lymphadenopathy, a left upper lobe nodule, and resolution of a left adrenal nodule   Oxaliplatin deleted from cycle 8 FOLFOX 04/02/2014 secondary to neuropathy and thrombocytopenia.   Oxaliplatin held with cycle 9 FOLFOX 04/16/2014 due to neuropathy.   Cycle 10 FOLFOX 04/30/2014.   Cycle 11 FOLFOX 05/15/2014.   Cycle 12 FOLFOX 05/28/2014. Oxaliplatin held due to increased neuropathy.   Restaging CT evaluation 06/08/2014 with stable mediastinal and hilar adenopathy. Stable borderline left iliac lymph node. No new or progressive disease identified within the chest, abdomen or pelvis.   "Maintenance" 5-fluorouracil beginning 06/11/2014.   Restaging CT evaluation 10/10/2014 showed similar mild mediastinal and right hilar adenopathy. No visible recurrence of the prior left upper lobe metastatic lesion or the left adrenal metastatic lesion. No new lesions noted.   Maintenance Xeloda on a 7 day on/7 day off schedule beginning 10/25/2014   Restaging CTs 03/04/2015 with stable mediastinal lymph nodes and no evidence of metastatic disease   Xeloda continued   Xeloda dose reduced beginning 04/15/2015 due to progressive hand-foot syndrome.   Xeloda placed on hold 05/15/2015 due to continued hand/foot pain.   Xeloda resumed 06/10/2015.   Restaging CTs 08/09/2015 with no evidence of disease progression   Continuation of maintenance Xeloda  CTs 08/03/2016-no evidence of metastatic disease, no pathologic chest lymphadenopathy  Continuation of maintenance Xeloda 2. Microcytic anemia-likely iron deficiency, improved.  3. Gout. 4. Port-A-Cath placement 12/18/2013.  5. Anxiety. He takes Xanax as needed. 6. History of left knee pain and swelling. He was treated with a Medrol Dosepak 03/05/2014 7. Oxaliplatin neuropathy. Persistent with pain in the  toes. Trial of Cymbalta initiated 07/09/2014. Trial of amitriptyline initiated  12/11/2015. 8. History of Thrombocytopenia secondary to chemotherapy. 9. Skin rash 04/30/2014-resolved with doxycycline 10. Chronic edema left lower leg/ankle. Negative Doppler 09/17/2014 11. Hand-foot syndrome secondary to Xeloda. Improved. 12. Enlargement of the left testicle 08/13/2015. He has been evaluated by urology. 13. Hypertension.Norvasc prescribed 05/13/2016. Resumed 10/28/2016. Reports not taking 12/02/2016 due to normal blood pressure outside of the office.   Disposition: Austin Robbins appears well. There is no clinical evidence of disease progression. Plan to continue Xeloda 7 days on/7 days off.  He has had a recent cough and sore throat. Symptoms are improving. He understands to contact the office with worsening of current symptoms or fever, shortness of breath.  He will return for a follow-up visit and Port-A-Cath flush in 6 weeks.  Plan reviewed with Dr. Benay Spice.    [name] ANP/GNP-BC   12/02/2016  8:51 AM

## 2016-12-22 ENCOUNTER — Other Ambulatory Visit: Payer: Self-pay | Admitting: *Deleted

## 2016-12-22 DIAGNOSIS — C20 Malignant neoplasm of rectum: Secondary | ICD-10-CM

## 2016-12-22 MED ORDER — CAPECITABINE 500 MG PO TABS: ORAL_TABLET | ORAL | 0 refills | Status: DC

## 2016-12-23 ENCOUNTER — Other Ambulatory Visit: Payer: Self-pay | Admitting: *Deleted

## 2016-12-23 MED ORDER — AMLODIPINE BESYLATE 5 MG PO TABS: 5.0000 mg | ORAL_TABLET | Freq: Every day | ORAL | 0 refills | Status: DC

## 2016-12-29 ENCOUNTER — Telehealth: Payer: Self-pay | Admitting: *Deleted

## 2016-12-29 NOTE — Telephone Encounter (Signed)
Message from [place] with [employer] requesting verbal order to refill pt's ostomy supplies. Spoke with pt's wife she reports they recently switched suppliers to [employer]. Pt requires urostomy and colostomy bags. He uses [employer] brand. Left message on voicemail for [facility] #[id number] with verbal order, per Dr. Benay Spice.

## 2016-12-30 ENCOUNTER — Other Ambulatory Visit: Payer: Self-pay | Admitting: *Deleted

## 2016-12-30 DIAGNOSIS — C20 Malignant neoplasm of rectum: Secondary | ICD-10-CM

## 2016-12-30 MED ORDER — INDOMETHACIN 50 MG PO CAPS: 50.0000 mg | ORAL_CAPSULE | Freq: Three times a day (TID) | ORAL | 0 refills | Status: DC | PRN

## 2016-12-30 NOTE — Telephone Encounter (Signed)
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  Received message with verbal order for supplies.  Will fax orders for [employer] bags, handles and flanges to [phone] for lifetime order.  Provider needs to sign, print name and date.  We also will need a copy of the last office note sent with these orders."

## 2016-12-31 ENCOUNTER — Telehealth: Payer: Self-pay | Admitting: Medical Oncology

## 2016-12-31 NOTE — Telephone Encounter (Signed)
Colostomy Drainage bag, drainable pouches and flanges order faxed

## 2017-01-13 ENCOUNTER — Telehealth: Payer: Self-pay | Admitting: Oncology

## 2017-01-13 ENCOUNTER — Other Ambulatory Visit (HOSPITAL_BASED_OUTPATIENT_CLINIC_OR_DEPARTMENT_OTHER): Payer: BLUE CROSS/BLUE SHIELD

## 2017-01-13 ENCOUNTER — Ambulatory Visit: Payer: BLUE CROSS/BLUE SHIELD

## 2017-01-13 ENCOUNTER — Ambulatory Visit (HOSPITAL_BASED_OUTPATIENT_CLINIC_OR_DEPARTMENT_OTHER): Payer: BLUE CROSS/BLUE SHIELD | Admitting: Oncology

## 2017-01-13 VITALS — BP 146/93 | HR 56 | Temp 97.8°F | Resp 18 | Ht 73.0 in | Wt 236.5 lb

## 2017-01-13 VITALS — BP 145/95 | HR 62 | Temp 97.8°F | Resp 18

## 2017-01-13 DIAGNOSIS — D509 Iron deficiency anemia, unspecified: Secondary | ICD-10-CM

## 2017-01-13 DIAGNOSIS — F419 Anxiety disorder, unspecified: Secondary | ICD-10-CM | POA: Diagnosis not present

## 2017-01-13 DIAGNOSIS — R234 Changes in skin texture: Secondary | ICD-10-CM | POA: Diagnosis not present

## 2017-01-13 DIAGNOSIS — I10 Essential (primary) hypertension: Secondary | ICD-10-CM

## 2017-01-13 DIAGNOSIS — C7972 Secondary malignant neoplasm of left adrenal gland: Secondary | ICD-10-CM

## 2017-01-13 DIAGNOSIS — C20 Malignant neoplasm of rectum: Secondary | ICD-10-CM | POA: Diagnosis not present

## 2017-01-13 LAB — CBC WITH DIFFERENTIAL/PLATELET
BASO%: 0.8 % (ref 0.0–2.0)
Basophils Absolute: 0.1 10*3/uL (ref 0.0–0.1)
EOS ABS: 0.2 10*3/uL (ref 0.0–0.5)
EOS%: 2.4 % (ref 0.0–7.0)
HEMATOCRIT: 46.6 % (ref 38.4–49.9)
HGB: 16.1 g/dL (ref 13.0–17.1)
LYMPH%: 33.3 % (ref 14.0–49.0)
MCH: 34.1 pg — ABNORMAL HIGH (ref 27.2–33.4)
MCHC: 34.6 g/dL (ref 32.0–36.0)
MCV: 98.6 fL — AB (ref 79.3–98.0)
MONO#: 0.7 10*3/uL (ref 0.1–0.9)
MONO%: 9.2 % (ref 0.0–14.0)
NEUT%: 54.3 % (ref 39.0–75.0)
NEUTROS ABS: 4.1 10*3/uL (ref 1.5–6.5)
Platelets: 180 10*3/uL (ref 140–400)
RBC: 4.72 10*6/uL (ref 4.20–5.82)
RDW: 14.9 % — ABNORMAL HIGH (ref 11.0–14.6)
WBC: 7.5 10*3/uL (ref 4.0–10.3)
lymph#: 2.5 10*3/uL (ref 0.9–3.3)

## 2017-01-13 LAB — COMPREHENSIVE METABOLIC PANEL
ALK PHOS: 74 U/L (ref 40–150)
ALT: 18 U/L (ref 0–55)
ANION GAP: 8 meq/L (ref 3–11)
AST: 18 U/L (ref 5–34)
Albumin: 3.9 g/dL (ref 3.5–5.0)
BILIRUBIN TOTAL: 0.83 mg/dL (ref 0.20–1.20)
BUN: 10.8 mg/dL (ref 7.0–26.0)
CALCIUM: 9.2 mg/dL (ref 8.4–10.4)
CO2: 25 meq/L (ref 22–29)
Chloride: 107 mEq/L (ref 98–109)
Creatinine: 0.8 mg/dL (ref 0.7–1.3)
Glucose: 97 mg/dl (ref 70–140)
Potassium: 4 mEq/L (ref 3.5–5.1)
Sodium: 140 mEq/L (ref 136–145)
TOTAL PROTEIN: 7.1 g/dL (ref 6.4–8.3)

## 2017-01-13 LAB — CEA (IN HOUSE-CHCC): CEA (CHCC-IN HOUSE): 3.07 ng/mL (ref 0.00–5.00)

## 2017-01-13 MED ORDER — SODIUM CHLORIDE 0.9 % IJ SOLN
10.0000 mL | INTRAMUSCULAR | Status: DC | PRN
  Administered 2017-01-13: 10 mL via INTRAVENOUS
  Filled 2017-01-13: qty 10

## 2017-01-13 MED ORDER — HEPARIN SOD (PORK) LOCK FLUSH 100 UNIT/ML IV SOLN
500.0000 [IU] | Freq: Once | INTRAVENOUS | Status: AC | PRN
  Administered 2017-01-13: 500 [IU] via INTRAVENOUS
  Filled 2017-01-13: qty 5

## 2017-01-13 NOTE — Progress Notes (Signed)
[facility] OFFICE PROGRESS NOTE   Diagnosis: Colon cancer  INTERVAL HISTORY:   [facility] returns as scheduled. He continues Xeloda. No mouth sores or diarrhea. He has skin thickening at the hands. He has persistent neuropathy symptoms in the feet. He had a flare of gout in the left great toe last week. Good appetite. He reports intentional weight loss. He is working.  Objective:  Vital signs in last 24 hours:  Blood pressure (!) 146/93, pulse (!) 56, temperature 97.8 F (36.6 C), temperature source Oral, resp. rate 18, height 6' 1" (1.854 m), weight 236 lb 8 oz (107.3 kg), SpO2 100 %.    HEENT: Erythema at the palate without discrete ulcers, no thrush Resp: Lungs clear bilaterally Cardio: Regular rate and rhythm GI: No hepatosplenomegaly, right lower quadrant urostomy, left lower quadrant colostomy Vascular: No leg edema  Skin: Skin thickening of the hands without erythema or skin breakdown.   Portacath/PICC-without erythema  Lab Results:  Lab Results  Component Value Date   WBC 7.5 01/13/2017   HGB 16.1 01/13/2017   HCT 46.6 01/13/2017   MCV 98.6 (H) 01/13/2017   PLT 180 01/13/2017   NEUTROABS 4.1 01/13/2017    CMP     Component Value Date/Time   NA 140 01/13/2017 0922   K 4.0 01/13/2017 0922   CL 107 04/16/2014 0920   CO2 25 01/13/2017 0922   GLUCOSE 97 01/13/2017 0922   BUN 10.8 01/13/2017 0922   CREATININE 0.8 01/13/2017 0922   CALCIUM 9.2 01/13/2017 0922   PROT 7.1 01/13/2017 0922   ALBUMIN 3.9 01/13/2017 0922   AST 18 01/13/2017 0922   ALT 18 01/13/2017 0922   ALKPHOS 74 01/13/2017 0922   BILITOT 0.83 01/13/2017 0922   GFRNONAA >90 11/09/2013 0445   GFRAA >90 11/09/2013 0445    Lab Results  Component Value Date   CEA1 2.76 12/02/2016     Medications: I have reviewed the patient's current medications.  Assessment/Plan: : 1. Clinical stage IV (T4b,N2b,M1) adenocarcinoma of the rectum with tumor directly extending to the  bladder/prostate and CT scan evidence of metastatic chest adenopathy  Positive K-ras mutation.   Normal mismatch repair protein expression. Microsatellite stable   Status post low anterior resection and end colostomy with a cystoprostatectomy and colon conduit urinary diversion 11/01/2013.   Staging PET scan with hypermetabolic pelvic nodes, mediastinal nodes, left adrenal metastasis, and left upper lobe nodule.   Initiation of FOLFOX 12/25/2013.   Restaging CT 03/16/2014 after 6 cycles of FOLFOX confirmed improvement in chest/pelvic lymphadenopathy, a left upper lobe nodule, and resolution of a left adrenal nodule   Oxaliplatin deleted from cycle 8 FOLFOX 04/02/2014 secondary to neuropathy and thrombocytopenia.   Oxaliplatin held with cycle 9 FOLFOX 04/16/2014 due to neuropathy.   Cycle 10 FOLFOX 04/30/2014.   Cycle 11 FOLFOX 05/15/2014.   Cycle 12 FOLFOX 05/28/2014. Oxaliplatin held due to increased neuropathy.   Restaging CT evaluation 06/08/2014 with stable mediastinal and hilar adenopathy. Stable borderline left iliac lymph node. No new or progressive disease identified within the chest, abdomen or pelvis.   "Maintenance" 5-fluorouracil beginning 06/11/2014.   Restaging CT evaluation 10/10/2014 showed similar mild mediastinal and right hilar adenopathy. No visible recurrence of the prior left upper lobe metastatic lesion or the left adrenal metastatic lesion. No new lesions noted.   Maintenance Xeloda on a 7 day on/7 day off schedule beginning 10/25/2014   Restaging CTs 03/04/2015 with stable mediastinal lymph nodes and no evidence of metastatic   disease   Xeloda continued   Xeloda dose reduced beginning 04/15/2015 due to progressive hand-foot syndrome.   Xeloda placed on hold 05/15/2015 due to continued hand/foot pain.   Xeloda resumed 06/10/2015.   Restaging CTs 08/09/2015 with no evidence of disease progression   Continuation of maintenance  Xeloda  CTs 08/03/2016-no evidence of metastatic disease, no pathologic chest lymphadenopathy  Continuation of maintenance Xeloda 2. Microcytic anemia-likely iron deficiency, improved.  3. Gout. 4. Port-A-Cath placement 12/18/2013.  5. Anxiety. He takes Xanax as needed. 6. History of left knee pain and swelling. 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 Trial of Cymbalta initiated 07/09/2014. Trial of amitriptyline initiated 12/11/2015. 8. History of Thrombocytopenia secondary to chemotherapy. 9. Skin rash 04/30/2014-resolved with doxycycline 10. Chronic edema left lower leg/ankle. Negative Doppler 09/17/2014 11. Hand-foot syndrome secondary to Xeloda. Improved. 12. Enlargement of the left testicle 08/13/2015. He has been evaluated by urology. 13. Hypertension.Norvasc prescribed 05/13/2016. Resumed 10/28/2016. Reports not taking 12/02/2016 due to normal blood pressure outside of the office.   Disposition:  [facility] Robbins appears stable. He continues Xeloda. He is tolerating the Xeloda well. We discussed the risk/benefit of continuing Xeloda. We also discussed the indication for removing the Port-A-Cath. He agrees to continuing Xeloda and will leave the Port-A-Cath in place for now.  [facility] Robbins will return for an office visit and Port-A-Cath flush in 6 weeks.  15 minutes were spent with the patient today. The majority of the time was used for counseling and core discussion of care.  [name], MD  01/13/2017  11:08 AM

## 2017-01-13 NOTE — Telephone Encounter (Signed)
Gave patient AVS and calender per 5/9 los. f/u , lab and flush 6/20

## 2017-01-15 ENCOUNTER — Telehealth: Payer: Self-pay

## 2017-01-15 DIAGNOSIS — C20 Malignant neoplasm of rectum: Secondary | ICD-10-CM

## 2017-01-15 MED ORDER — ALPRAZOLAM 1 MG PO TABS: ORAL_TABLET | ORAL | 0 refills | Status: DC

## 2017-01-15 NOTE — Telephone Encounter (Signed)
Wife called for xanax refill. Done per protocol.

## 2017-01-21 ENCOUNTER — Other Ambulatory Visit: Payer: Self-pay | Admitting: *Deleted

## 2017-01-21 DIAGNOSIS — C20 Malignant neoplasm of rectum: Secondary | ICD-10-CM

## 2017-01-21 MED ORDER — AMLODIPINE BESYLATE 5 MG PO TABS: 5.0000 mg | ORAL_TABLET | Freq: Every day | ORAL | 0 refills | Status: DC

## 2017-01-21 MED ORDER — CAPECITABINE 500 MG PO TABS: ORAL_TABLET | ORAL | 0 refills | Status: DC

## 2017-02-05 ENCOUNTER — Other Ambulatory Visit: Payer: Self-pay | Admitting: *Deleted

## 2017-02-05 MED ORDER — AMLODIPINE BESYLATE 5 MG PO TABS: 5.0000 mg | ORAL_TABLET | Freq: Every day | ORAL | 1 refills | Status: DC

## 2017-02-24 ENCOUNTER — Other Ambulatory Visit (HOSPITAL_BASED_OUTPATIENT_CLINIC_OR_DEPARTMENT_OTHER): Payer: BLUE CROSS/BLUE SHIELD

## 2017-02-24 ENCOUNTER — Ambulatory Visit (HOSPITAL_BASED_OUTPATIENT_CLINIC_OR_DEPARTMENT_OTHER): Payer: BLUE CROSS/BLUE SHIELD | Admitting: Nurse Practitioner

## 2017-02-24 ENCOUNTER — Telehealth: Payer: Self-pay | Admitting: Nurse Practitioner

## 2017-02-24 ENCOUNTER — Ambulatory Visit: Payer: BLUE CROSS/BLUE SHIELD

## 2017-02-24 DIAGNOSIS — C20 Malignant neoplasm of rectum: Secondary | ICD-10-CM

## 2017-02-24 DIAGNOSIS — I10 Essential (primary) hypertension: Secondary | ICD-10-CM

## 2017-02-24 DIAGNOSIS — C7972 Secondary malignant neoplasm of left adrenal gland: Secondary | ICD-10-CM

## 2017-02-24 DIAGNOSIS — D509 Iron deficiency anemia, unspecified: Secondary | ICD-10-CM

## 2017-02-24 LAB — CBC WITH DIFFERENTIAL/PLATELET
BASO%: 0.8 % (ref 0.0–2.0)
BASOS ABS: 0.1 10*3/uL (ref 0.0–0.1)
EOS ABS: 0.2 10*3/uL (ref 0.0–0.5)
EOS%: 3.5 % (ref 0.0–7.0)
HEMATOCRIT: 44.6 % (ref 38.4–49.9)
HEMOGLOBIN: 15.8 g/dL (ref 13.0–17.1)
LYMPH%: 30.4 % (ref 14.0–49.0)
MCH: 35 pg — ABNORMAL HIGH (ref 27.2–33.4)
MCHC: 35.4 g/dL (ref 32.0–36.0)
MCV: 99 fL — AB (ref 79.3–98.0)
MONO#: 0.6 10*3/uL (ref 0.1–0.9)
MONO%: 8.5 % (ref 0.0–14.0)
NEUT#: 3.8 10*3/uL (ref 1.5–6.5)
NEUT%: 56.8 % (ref 39.0–75.0)
Platelets: 175 10*3/uL (ref 140–400)
RBC: 4.51 10*6/uL (ref 4.20–5.82)
RDW: 15.4 % — AB (ref 11.0–14.6)
WBC: 6.7 10*3/uL (ref 4.0–10.3)
lymph#: 2 10*3/uL (ref 0.9–3.3)

## 2017-02-24 LAB — COMPREHENSIVE METABOLIC PANEL
ALBUMIN: 3.6 g/dL (ref 3.5–5.0)
ALK PHOS: 78 U/L (ref 40–150)
ALT: 20 U/L (ref 0–55)
AST: 20 U/L (ref 5–34)
Anion Gap: 14 mEq/L — ABNORMAL HIGH (ref 3–11)
BUN: 15.7 mg/dL (ref 7.0–26.0)
CALCIUM: 9.2 mg/dL (ref 8.4–10.4)
CO2: 20 mEq/L — ABNORMAL LOW (ref 22–29)
Chloride: 107 mEq/L (ref 98–109)
Creatinine: 0.8 mg/dL (ref 0.7–1.3)
Glucose: 105 mg/dl (ref 70–140)
Potassium: 3.9 mEq/L (ref 3.5–5.1)
Sodium: 141 mEq/L (ref 136–145)
Total Bilirubin: 0.61 mg/dL (ref 0.20–1.20)
Total Protein: 7.1 g/dL (ref 6.4–8.3)

## 2017-02-24 MED ORDER — HEPARIN SOD (PORK) LOCK FLUSH 100 UNIT/ML IV SOLN
500.0000 [IU] | Freq: Once | INTRAVENOUS | Status: AC | PRN
  Administered 2017-02-24: 500 [IU] via INTRAVENOUS
  Filled 2017-02-24: qty 5

## 2017-02-24 MED ORDER — SODIUM CHLORIDE 0.9 % IJ SOLN
10.0000 mL | INTRAMUSCULAR | Status: DC | PRN
  Administered 2017-02-24: 10 mL via INTRAVENOUS
  Filled 2017-02-24: qty 10

## 2017-02-24 MED ORDER — CAPECITABINE 500 MG PO TABS: ORAL_TABLET | ORAL | 0 refills | Status: DC

## 2017-02-24 MED ORDER — AMLODIPINE BESYLATE 5 MG PO TABS: 5.0000 mg | ORAL_TABLET | Freq: Every day | ORAL | 3 refills | Status: DC

## 2017-02-24 MED ORDER — ALPRAZOLAM 1 MG PO TABS: ORAL_TABLET | ORAL | 1 refills | Status: DC

## 2017-02-24 MED ORDER — AMITRIPTYLINE HCL 25 MG PO TABS: 25.0000 mg | ORAL_TABLET | Freq: Every day | ORAL | 3 refills | Status: DC

## 2017-02-24 NOTE — Addendum Note (Signed)
Addended by: Jethro Bolus A on: 02/24/2017 04:49 PM   Modules accepted: Orders

## 2017-02-24 NOTE — Telephone Encounter (Signed)
Scheduled appt per 6/20 los - Gave patient AVS and calender per LOS.  

## 2017-02-24 NOTE — Patient Instructions (Signed)

## 2017-02-24 NOTE — Progress Notes (Signed)
[facility] OFFICE PROGRESS NOTE   Diagnosis:  Colon cancer  INTERVAL HISTORY:   [facility] Robbins returns as scheduled. He continues Xeloda. He feels well. No nausea or vomiting. No mouth sores. No diarrhea. No hand or foot pain or redness. He denies abdominal pain. He has a good appetite. He remains very active.  Objective:  Vital signs in last 24 hours:  Blood pressure 128/88, pulse 65, temperature 98.5 F (36.9 C), temperature source Oral, resp. rate 17, height 6' 1" (1.854 m), weight 237 lb 6.4 oz (107.7 kg), SpO2 98 %.    HEENT: No thrush or ulcers. Resp: Lungs clear bilaterally. Cardio: Regular rate and rhythm. GI: Abdomen soft and nontender. No hepatomegaly. Right lower quadrant urostomy. Left lower quadrant colostomy. Vascular: Trace lower leg/ankle edema bilaterally. Skin: Palms with very mild erythema. No skin breakdown. Port-A-Cath without erythema.    Lab Results:  Lab Results  Component Value Date   WBC 6.7 02/24/2017   HGB 15.8 02/24/2017   HCT 44.6 02/24/2017   MCV 99.0 (H) 02/24/2017   PLT 175 02/24/2017   NEUTROABS 3.8 02/24/2017    Imaging:  No results found.  Medications: I have reviewed the patient's current medications.  Assessment/Plan: 1. Clinical stage IV (G2B,W3S,L3) adenocarcinoma of the rectum with tumor directly extending to the bladder/prostate and CT scan evidence of metastatic chest adenopathy  Positive K-ras mutation.   Normal mismatch repair protein expression. Microsatellite stable   Status post low anterior resection and end colostomy with a cystoprostatectomy and colon conduit urinary diversion 11/01/2013.   Staging PET scan with hypermetabolic pelvic nodes, mediastinal nodes, left adrenal metastasis, and left upper lobe nodule.   Initiation of FOLFOX 12/25/2013.   Restaging CT 03/16/2014 after 6 cycles of FOLFOX confirmed improvement in chest/pelvic lymphadenopathy, a left upper lobe nodule, and resolution  of a left adrenal nodule   Oxaliplatin deleted from cycle 8 FOLFOX 04/02/2014 secondary to neuropathy and thrombocytopenia.   Oxaliplatin held with cycle 9 FOLFOX 04/16/2014 due to neuropathy.   Cycle 10 FOLFOX 04/30/2014.   Cycle 11 FOLFOX 05/15/2014.   Cycle 12 FOLFOX 05/28/2014. Oxaliplatin held due to increased neuropathy.   Restaging CT evaluation 06/08/2014 with stable mediastinal and hilar adenopathy. Stable borderline left iliac lymph node. No new or progressive disease identified within the chest, abdomen or pelvis.   "Maintenance" 5-fluorouracil beginning 06/11/2014.   Restaging CT evaluation 10/10/2014 showed similar mild mediastinal and right hilar adenopathy. No visible recurrence of the prior left upper lobe metastatic lesion or the left adrenal metastatic lesion. No new lesions noted.   Maintenance Xeloda on a 7 day on/7 day off schedule beginning 10/25/2014   Restaging CTs 03/04/2015 with stable mediastinal lymph nodes and no evidence of metastatic disease   Xeloda continued   Xeloda dose reduced beginning 04/15/2015 due to progressive hand-foot syndrome.   Xeloda placed on hold 05/15/2015 due to continued hand/foot pain.   Xeloda resumed 06/10/2015.   Restaging CTs 08/09/2015 with no evidence of disease progression   Continuation of maintenance Xeloda  CTs 08/03/2016-no evidence of metastatic disease, no pathologic chest lymphadenopathy  Continuation of maintenance Xeloda 2. Microcytic anemia-likely iron deficiency, improved.  3. Gout. 4. Port-A-Cath placement 12/18/2013.  5. Anxiety. He takes Xanax as needed. 6. History of left knee pain and swelling. 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 Trial of Cymbalta initiated 07/09/2014. Trial of amitriptyline initiated 12/11/2015. 8. History of Thrombocytopenia secondary to chemotherapy.  9. Skin rash 04/30/2014-resolved with  doxycycline 10. Chronic edema left lower leg/ankle. Negative Doppler 09/17/2014 11. Hand-foot syndrome secondary to Xeloda. Improved. 12. Enlargement of the left testicle 08/13/2015. He has been evaluated by urology. 13. Hypertension.Norvasc prescribed 05/13/2016. Resumed 10/28/2016. Reports not taking 12/02/2016 due to normal blood pressure outside of the office.Reports taking consistently 02/24/2017.    Disposition: [name] appears well. There is no clinical evidence of disease progression. Plan to continue Xeloda 7 days on/7 days off. He will return for a follow-up visit and Port-A-Cath flush in 6 weeks.  The plan is for restaging CT scans at a one-year interval from the previous.    [name] ANP/GNP-BC   02/24/2017  8:48 AM

## 2017-03-05 ENCOUNTER — Other Ambulatory Visit: Payer: Self-pay | Admitting: *Deleted

## 2017-03-05 DIAGNOSIS — C20 Malignant neoplasm of rectum: Secondary | ICD-10-CM

## 2017-03-05 MED ORDER — AMLODIPINE BESYLATE 5 MG PO TABS: 5.0000 mg | ORAL_TABLET | Freq: Every day | ORAL | 0 refills | Status: DC

## 2017-03-08 ENCOUNTER — Other Ambulatory Visit: Payer: Self-pay | Admitting: *Deleted

## 2017-03-08 DIAGNOSIS — C20 Malignant neoplasm of rectum: Secondary | ICD-10-CM

## 2017-03-08 MED ORDER — AMITRIPTYLINE HCL 25 MG PO TABS: 25.0000 mg | ORAL_TABLET | Freq: Every day | ORAL | 0 refills | Status: DC

## 2017-03-24 ENCOUNTER — Other Ambulatory Visit: Payer: Self-pay | Admitting: *Deleted

## 2017-03-24 DIAGNOSIS — C20 Malignant neoplasm of rectum: Secondary | ICD-10-CM

## 2017-03-24 MED ORDER — CAPECITABINE 500 MG PO TABS: ORAL_TABLET | ORAL | 0 refills | Status: DC

## 2017-04-06 ENCOUNTER — Telehealth: Payer: Self-pay

## 2017-04-06 NOTE — Telephone Encounter (Signed)
Called to confirm verbal order for ostomy supplies.

## 2017-04-07 ENCOUNTER — Ambulatory Visit (HOSPITAL_COMMUNITY)
Admission: RE | Admit: 2017-04-07 | Discharge: 2017-04-07 | Disposition: A | Discharge status: Home / Self Care | Payer: BLUE CROSS/BLUE SHIELD | Source: Ambulatory Visit | Attending: Oncology | Admitting: Oncology

## 2017-04-07 ENCOUNTER — Ambulatory Visit: Payer: BLUE CROSS/BLUE SHIELD

## 2017-04-07 ENCOUNTER — Other Ambulatory Visit (HOSPITAL_BASED_OUTPATIENT_CLINIC_OR_DEPARTMENT_OTHER): Payer: BLUE CROSS/BLUE SHIELD

## 2017-04-07 ENCOUNTER — Ambulatory Visit (HOSPITAL_BASED_OUTPATIENT_CLINIC_OR_DEPARTMENT_OTHER): Payer: BLUE CROSS/BLUE SHIELD | Admitting: Oncology

## 2017-04-07 ENCOUNTER — Telehealth: Payer: Self-pay | Admitting: Oncology

## 2017-04-07 VITALS — BP 154/93 | HR 55 | Temp 98.4°F | Resp 18 | Ht 73.0 in | Wt 234.0 lb

## 2017-04-07 DIAGNOSIS — I10 Essential (primary) hypertension: Secondary | ICD-10-CM

## 2017-04-07 DIAGNOSIS — C20 Malignant neoplasm of rectum: Secondary | ICD-10-CM

## 2017-04-07 DIAGNOSIS — D509 Iron deficiency anemia, unspecified: Secondary | ICD-10-CM

## 2017-04-07 DIAGNOSIS — M7989 Other specified soft tissue disorders: Secondary | ICD-10-CM | POA: Diagnosis not present

## 2017-04-07 LAB — CEA (IN HOUSE-CHCC): CEA (CHCC-In House): 3.09 ng/mL (ref 0.00–5.00)

## 2017-04-07 LAB — CBC WITH DIFFERENTIAL/PLATELET
BASO%: 0.2 % (ref 0.0–2.0)
Basophils Absolute: 0 10*3/uL (ref 0.0–0.1)
EOS%: 3.5 % (ref 0.0–7.0)
Eosinophils Absolute: 0.2 10*3/uL (ref 0.0–0.5)
HCT: 42.1 % (ref 38.4–49.9)
HEMOGLOBIN: 14.9 g/dL (ref 13.0–17.1)
LYMPH%: 33.7 % (ref 14.0–49.0)
MCH: 34.7 pg — ABNORMAL HIGH (ref 27.2–33.4)
MCHC: 35.4 g/dL (ref 32.0–36.0)
MCV: 98.1 fL — ABNORMAL HIGH (ref 79.3–98.0)
MONO#: 0.5 10*3/uL (ref 0.1–0.9)
MONO%: 8.4 % (ref 0.0–14.0)
NEUT%: 54.2 % (ref 39.0–75.0)
NEUTROS ABS: 3 10*3/uL (ref 1.5–6.5)
Platelets: 167 10*3/uL (ref 140–400)
RBC: 4.29 10*6/uL (ref 4.20–5.82)
RDW: 15 % — AB (ref 11.0–14.6)
WBC: 5.5 10*3/uL (ref 4.0–10.3)
lymph#: 1.8 10*3/uL (ref 0.9–3.3)

## 2017-04-07 LAB — COMPREHENSIVE METABOLIC PANEL
ALT: 18 U/L (ref 0–55)
ANION GAP: 7 meq/L (ref 3–11)
AST: 16 U/L (ref 5–34)
Albumin: 3.6 g/dL (ref 3.5–5.0)
Alkaline Phosphatase: 76 U/L (ref 40–150)
BUN: 12.1 mg/dL (ref 7.0–26.0)
CHLORIDE: 108 meq/L (ref 98–109)
CO2: 25 meq/L (ref 22–29)
Calcium: 8.8 mg/dL (ref 8.4–10.4)
Creatinine: 0.8 mg/dL (ref 0.7–1.3)
Glucose: 106 mg/dl (ref 70–140)
Potassium: 3.8 mEq/L (ref 3.5–5.1)
SODIUM: 140 meq/L (ref 136–145)
TOTAL PROTEIN: 6.4 g/dL (ref 6.4–8.3)
Total Bilirubin: 0.65 mg/dL (ref 0.20–1.20)

## 2017-04-07 MED ORDER — SODIUM CHLORIDE 0.9 % IJ SOLN
10.0000 mL | INTRAMUSCULAR | Status: DC | PRN
  Administered 2017-04-07: 10 mL via INTRAVENOUS
  Filled 2017-04-07: qty 10

## 2017-04-07 MED ORDER — HEPARIN SOD (PORK) LOCK FLUSH 100 UNIT/ML IV SOLN
500.0000 [IU] | Freq: Once | INTRAVENOUS | Status: AC | PRN
  Administered 2017-04-07: 500 [IU] via INTRAVENOUS
  Filled 2017-04-07: qty 5

## 2017-04-07 NOTE — Patient Instructions (Signed)

## 2017-04-07 NOTE — Progress Notes (Signed)
[facility] OFFICE PROGRESS NOTE   Diagnosis: Rectal cancer  INTERVAL HISTORY:   [facility] Robbins returns as scheduled. He continues Xeloda. He feels "tightness "in the feet but they are not swollen. No skin changes at the feet. He has noted swelling at the right leg for the past several days. Good appetite. He is working.  Objective:  Vital signs in last 24 hours:  Blood pressure (!) 154/93, pulse (!) 55, temperature 98.4 F (36.9 C), temperature source Oral, resp. rate 18, height '6\' 1"'$  (1.854 m), weight 234 lb (106.1 kg), SpO2 100 %.    HEENT: No thrush or ulcers Lymphatics: No cervical, supraclavicular, axillary, or inguinal nodes Resp: Lungs clear bilaterally Cardio: Regular rate and rhythm GI: No hepatomegaly, left lower quadrant colostomy, right lower quadrant urostomy Vascular: Trace edema at the right leg below the knee. No erythema or tenderness  Skin: Palms without erythema   Portacath/PICC-without erythema  Lab Results:  Lab Results  Component Value Date   WBC 5.5 04/07/2017   HGB 14.9 04/07/2017   HCT 42.1 04/07/2017   MCV 98.1 (H) 04/07/2017   PLT 167 04/07/2017   NEUTROABS 3.0 04/07/2017    CMP     Component Value Date/Time   NA 140 04/07/2017 0809   K 3.8 04/07/2017 0809   CL 107 04/16/2014 0920   CO2 25 04/07/2017 0809   GLUCOSE 106 04/07/2017 0809   BUN 12.1 04/07/2017 0809   CREATININE 0.8 04/07/2017 0809   CALCIUM 8.8 04/07/2017 0809   PROT 6.4 04/07/2017 0809   ALBUMIN 3.6 04/07/2017 0809   AST 16 04/07/2017 0809   ALT 18 04/07/2017 0809   ALKPHOS 76 04/07/2017 0809   BILITOT 0.65 04/07/2017 0809   GFRNONAA >90 11/09/2013 0445   GFRAA >90 11/09/2013 0445    Lab Results  Component Value Date   CEA1 3.07 01/13/2017    Medications: I have reviewed the patient's current medications.  1. AClinical stage IV (G2E,Z6O,Q9) adenocarcinoma of the rectum with tumor directly extending to the bladder/prostate and CT scan evidence  of metastatic chest adenopathy  Positive K-ras mutation.   Normal mismatch repair protein expression. Microsatellite stable   Status post low anterior resection and end colostomy with a cystoprostatectomy and colon conduit urinary diversion 11/01/2013.   Staging PET scan with hypermetabolic pelvic nodes, mediastinal nodes, left adrenal metastasis, and left upper lobe nodule.   Initiation of FOLFOX 12/25/2013.   Restaging CT 03/16/2014 after 6 cycles of FOLFOX confirmed improvement in chest/pelvic lymphadenopathy, a left upper lobe nodule, and resolution of a left adrenal nodule   Oxaliplatin deleted from cycle 8 FOLFOX 04/02/2014 secondary to neuropathy and thrombocytopenia.   Oxaliplatin held with cycle 9 FOLFOX 04/16/2014 due to neuropathy.   Cycle 10 FOLFOX 04/30/2014.   Cycle 11 FOLFOX 05/15/2014.   Cycle 12 FOLFOX 05/28/2014. Oxaliplatin held due to increased neuropathy.   Restaging CT evaluation 06/08/2014 with stable mediastinal and hilar adenopathy. Stable borderline left iliac lymph node. No new or progressive disease identified within the chest, abdomen or pelvis.   "Maintenance" 5-fluorouracil beginning 06/11/2014.   Restaging CT evaluation 10/10/2014 showed similar mild mediastinal and right hilar adenopathy. No visible recurrence of the prior left upper lobe metastatic lesion or the left adrenal metastatic lesion. No new lesions noted.   Maintenance Xeloda on a 7 day on/7 day off schedule beginning 10/25/2014   Restaging CTs 03/04/2015 with stable mediastinal lymph nodes and no evidence of metastatic disease   Xeloda continued  Xeloda dose reduced beginning 04/15/2015 due to progressive hand-foot syndrome.   Xeloda placed on hold 05/15/2015 due to continued hand/foot pain.   Xeloda resumed 06/10/2015.   Restaging CTs 08/09/2015 with no evidence of disease progression   Continuation of maintenance Xeloda  CTs 08/03/2016-no evidence of  metastatic disease, no pathologic chest lymphadenopathy  Continuation of maintenance Xeloda 2. Microcytic anemia-likely iron deficiency, improved.  3. Gout. 4. Port-A-Cath placement 12/18/2013.  5. Anxiety. He takes Xanax as needed. 6. History of left knee pain and swelling. 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 Trial of Cymbalta initiated 07/09/2014. Trial of amitriptyline initiated 12/11/2015. 8. History of Thrombocytopenia secondary to chemotherapy. 9. Skin rash 04/30/2014-resolved with doxycycline 10. Chronic edema left lower leg/ankle. Negative Doppler 09/17/2014 11. Hand-foot syndrome secondary to Xeloda. Improved. 12. Enlargement of the left testicle 08/13/2015. He has been evaluated by urology. 13. Hypertension.Norvasc prescribed 05/13/2016. Resumed 10/28/2016. Reports not taking 12/02/2016 due to normal blood pressure outside of the office.Reports taking consistently 02/24/2017.    Disposition:  [facility] Robbins continue Xeloda. There is no clinical evidence of progressive rectal cancer. He will continue the current treatment. We will ask the Cancer center pharmacist to meet with him regarding payment assistance for the Xeloda.  We will plan for a restaging CT evaluation at a one-year interval.  He has swelling of the right leg today. I will refer him for a Doppler to rule out a deep vein thrombosis.  [facility] Robbins will return for an office visit in 6 weeks.  Donneta Romberg, MD  04/07/2017  9:32 AM

## 2017-04-07 NOTE — Telephone Encounter (Signed)
Scheduled appt per 8/1 los - Gave patient AVS and calender per los. - Doppler scheduled with Butch Penny

## 2017-04-07 NOTE — Progress Notes (Signed)
*  PRELIMINARY RESULTS* Vascular Ultrasound Right lower extremity venous duplex has been completed.  Preliminary findings: No evidence of deep vein thrombosis in the visualized veins of the right lower extremity.  Negative for baker's cyst on the right.  Multiple attempts made to reach Dr. Benay Spice, no answer.  Patient left but will follow up if he has any questions.   [facility] Robbins 04/07/2017, 1:41 PM

## 2017-05-12 ENCOUNTER — Other Ambulatory Visit: Payer: Self-pay | Admitting: *Deleted

## 2017-05-12 DIAGNOSIS — C20 Malignant neoplasm of rectum: Secondary | ICD-10-CM

## 2017-05-12 MED ORDER — AMLODIPINE BESYLATE 5 MG PO TABS
5.0000 mg | ORAL_TABLET | Freq: Every day | ORAL | 1 refills | Status: DC
Start: 2017-05-12 — End: 2017-08-11

## 2017-05-13 ENCOUNTER — Other Ambulatory Visit: Payer: Self-pay | Admitting: *Deleted

## 2017-05-19 ENCOUNTER — Ambulatory Visit: Payer: BLUE CROSS/BLUE SHIELD

## 2017-05-19 ENCOUNTER — Telehealth: Payer: Self-pay | Admitting: Oncology

## 2017-05-19 ENCOUNTER — Ambulatory Visit (HOSPITAL_BASED_OUTPATIENT_CLINIC_OR_DEPARTMENT_OTHER): Payer: BLUE CROSS/BLUE SHIELD | Admitting: Nurse Practitioner

## 2017-05-19 ENCOUNTER — Other Ambulatory Visit (HOSPITAL_BASED_OUTPATIENT_CLINIC_OR_DEPARTMENT_OTHER): Payer: BLUE CROSS/BLUE SHIELD

## 2017-05-19 VITALS — BP 157/96 | HR 61 | Temp 98.5°F | Resp 20 | Ht 73.0 in | Wt 233.9 lb

## 2017-05-19 DIAGNOSIS — D509 Iron deficiency anemia, unspecified: Secondary | ICD-10-CM

## 2017-05-19 DIAGNOSIS — I10 Essential (primary) hypertension: Secondary | ICD-10-CM

## 2017-05-19 DIAGNOSIS — C20 Malignant neoplasm of rectum: Secondary | ICD-10-CM

## 2017-05-19 LAB — CBC WITH DIFFERENTIAL/PLATELET
BASO%: 0.8 % (ref 0.0–2.0)
BASOS ABS: 0.1 10*3/uL (ref 0.0–0.1)
EOS ABS: 0.2 10*3/uL (ref 0.0–0.5)
EOS%: 2.5 % (ref 0.0–7.0)
HCT: 46.1 % (ref 38.4–49.9)
HGB: 16 g/dL (ref 13.0–17.1)
LYMPH%: 28.1 % (ref 14.0–49.0)
MCH: 34.8 pg — AB (ref 27.2–33.4)
MCHC: 34.8 g/dL (ref 32.0–36.0)
MCV: 100.1 fL — AB (ref 79.3–98.0)
MONO#: 0.5 10*3/uL (ref 0.1–0.9)
MONO%: 7.4 % (ref 0.0–14.0)
NEUT#: 4 10*3/uL (ref 1.5–6.5)
NEUT%: 61.2 % (ref 39.0–75.0)
PLATELETS: 181 10*3/uL (ref 140–400)
RBC: 4.6 10*6/uL (ref 4.20–5.82)
RDW: 15.7 % — ABNORMAL HIGH (ref 11.0–14.6)
WBC: 6.5 10*3/uL (ref 4.0–10.3)
lymph#: 1.8 10*3/uL (ref 0.9–3.3)

## 2017-05-19 MED ORDER — SODIUM CHLORIDE 0.9 % IJ SOLN
10.0000 mL | INTRAMUSCULAR | Status: DC | PRN
  Administered 2017-05-19: 10 mL via INTRAVENOUS
  Filled 2017-05-19: qty 10

## 2017-05-19 MED ORDER — ALPRAZOLAM 1 MG PO TABS: ORAL_TABLET | ORAL | 1 refills | Status: DC

## 2017-05-19 MED ORDER — HEPARIN SOD (PORK) LOCK FLUSH 100 UNIT/ML IV SOLN
500.0000 [IU] | Freq: Once | INTRAVENOUS | Status: AC | PRN
  Administered 2017-05-19: 500 [IU] via INTRAVENOUS
  Filled 2017-05-19: qty 5

## 2017-05-19 MED ORDER — INDOMETHACIN 50 MG PO CAPS: 50.0000 mg | ORAL_CAPSULE | Freq: Three times a day (TID) | ORAL | 0 refills | Status: DC | PRN

## 2017-05-19 MED ORDER — CAPECITABINE 500 MG PO TABS: ORAL_TABLET | ORAL | 0 refills | Status: DC

## 2017-05-19 NOTE — Telephone Encounter (Signed)
Gave patient avs report and appointments for October  °

## 2017-05-19 NOTE — Progress Notes (Signed)
[facility] OFFICE PROGRESS NOTE   Diagnosis:  Rectal cancer  INTERVAL HISTORY:   [facility] Rob[facility] returns as scheduled. He continues Xeloda. He denies nausea/vomiting. No mouth sores. No diarrhea. No hand or foot pain or redness. He periodically notes small dry areas on his palms. He remains very active.  Objective:  Vital signs in last 24 hours:  Blood pressure (!) 157/96, pulse 61, temperature 98.5 F (36.9 C), temperature source Oral, resp. rate 20, height _0  (1.854 m), weight 233 lb 14.4 oz (106.1 kg), SpO2 99 %.    HEENT: No thrush or ulcers. Lymphatics: No palpable cervical, supra clavicular or axillary lymph nodes. Resp: Lungs clear bilaterally. Cardio: Regular rate and rhythm. GI: Abdomen soft and nontender. No hepatomegaly. Left lower quadrant colostomy. Right lower quadrant urostomy. Vascular: Trace edema at the lower legs bilaterally. Skin: Palms without erythema.  Port-A-Cath without erythema.  Lab Results:  Lab Results  Component Value Date   WBC 6.5 05/19/2017   HGB 16.0 05/19/2017   HCT 46.1 05/19/2017   MCV 100.1 (H) 05/19/2017   PLT 181 05/19/2017   NEUTROABS 4.0 05/19/2017    Imaging:  No results found.  Medications: I have reviewed the patient's current medications.  Assessment/Plan: 1. Clinical stage IV (Y0V,P7T,G6) adenocarcinoma of the rectum with tumor directly extending to the bladder/prostate and CT scan evidence of metastatic chest adenopathy  Positive K-ras mutation.   Normal mismatch repair protein expression. Microsatellite stable   Status post low anterior resection and end colostomy with a cystoprostatectomy and colon conduit urinary diversion 11/01/2013.   Staging PET scan with hypermetabolic pelvic nodes, mediastinal nodes, left adrenal metastasis, and left upper lobe nodule.   Initiation of FOLFOX 12/25/2013.   Restaging CT 03/16/2014 after 6 cycles of FOLFOX confirmed improvement in chest/pelvic  lymphadenopathy, a left upper lobe nodule, and resolution of a left adrenal nodule   Oxaliplatin deleted from cycle 8 FOLFOX 04/02/2014 secondary to neuropathy and thrombocytopenia.   Oxaliplatin held with cycle 9 FOLFOX 04/16/2014 due to neuropathy.   Cycle 10 FOLFOX 04/30/2014.   Cycle 11 FOLFOX 05/15/2014.   Cycle 12 FOLFOX 05/28/2014. Oxaliplatin held due to increased neuropathy.   Restaging CT evaluation 06/08/2014 with stable mediastinal and hilar adenopathy. Stable borderline left iliac lymph node. No new or progressive disease identified within the chest, abdomen or pelvis.   "Maintenance" 5-fluorouracil beginning 06/11/2014.   Restaging CT evaluation 10/10/2014 showed similar mild mediastinal and right hilar adenopathy. No visible recurrence of the prior left upper lobe metastatic lesion or the left adrenal metastatic lesion. No new lesions noted.   Maintenance Xeloda on a 7 day on/7 day off schedule beginning 10/25/2014   Restaging CTs 03/04/2015 with stable mediastinal lymph nodes and no evidence of metastatic disease   Xeloda continued   Xeloda dose reduced beginning 04/15/2015 due to progressive hand-foot syndrome.   Xeloda placed on hold 05/15/2015 due to continued hand/foot pain.   Xeloda resumed 06/10/2015.   Restaging CTs 08/09/2015 with no evidence of disease progression   Continuation of maintenance Xeloda  CTs 08/03/2016-no evidence of metastatic disease, no pathologic chest lymphadenopathy  Continuation of maintenance Xeloda 2. Microcytic anemia-likely iron deficiency, improved.  3. Gout. 4. Port-A-Cath placement 12/18/2013.  5. Anxiety. He takes Xanax as needed. 6. History of left knee pain and swelling. 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 Trial of Cymbalta initiated 07/09/2014. Trial of amitriptyline initiated 12/11/2015. 8. History of Thrombocytopenia  secondary  to chemotherapy. 9. Skin rash 04/30/2014-resolved with doxycycline 10. Chronic edema left lower leg/ankle. Negative Doppler 09/17/2014 11. Hand-foot syndrome secondary to Xeloda. Improved. 12. Enlargement of the left testicle 08/13/2015. He has been evaluated by urology. 13. Hypertension.Norvasc prescribed 05/13/2016. Resumed 10/28/2016. Reports not taking 12/02/2016 due to normal blood pressure outside of the office.Reports taking consistently 02/24/2017. 14. Right leg edema 04/07/2017. Doppler negative for DVT.   Disposition: [facility] Rob[facility] appears well. There is no clinical evidence of disease progression. He will continue Xeloda. The plan is to obtain restaging CT scans at a one-year interval.  He will return for a follow-up visit in 6 weeks. He will contact the office in the interim with any problems.    [facility] Robbins ANP/GNP-BC   05/19/2017  10:25 AM

## 2017-05-21 ENCOUNTER — Other Ambulatory Visit: Payer: Self-pay | Admitting: *Deleted

## 2017-05-21 DIAGNOSIS — C20 Malignant neoplasm of rectum: Secondary | ICD-10-CM

## 2017-05-21 MED ORDER — CAPECITABINE 500 MG PO TABS: ORAL_TABLET | ORAL | 2 refills | Status: DC

## 2017-06-07 ENCOUNTER — Other Ambulatory Visit: Payer: Self-pay | Admitting: *Deleted

## 2017-06-07 DIAGNOSIS — C20 Malignant neoplasm of rectum: Secondary | ICD-10-CM

## 2017-06-07 MED ORDER — AMITRIPTYLINE HCL 25 MG PO TABS: 25.0000 mg | ORAL_TABLET | Freq: Every day | ORAL | 0 refills | Status: DC

## 2017-06-30 ENCOUNTER — Ambulatory Visit (HOSPITAL_BASED_OUTPATIENT_CLINIC_OR_DEPARTMENT_OTHER): Payer: BLUE CROSS/BLUE SHIELD | Admitting: Oncology

## 2017-06-30 ENCOUNTER — Telehealth: Payer: Self-pay | Admitting: Oncology

## 2017-06-30 ENCOUNTER — Other Ambulatory Visit (HOSPITAL_BASED_OUTPATIENT_CLINIC_OR_DEPARTMENT_OTHER): Payer: BLUE CROSS/BLUE SHIELD

## 2017-06-30 ENCOUNTER — Ambulatory Visit: Payer: BLUE CROSS/BLUE SHIELD

## 2017-06-30 ENCOUNTER — Other Ambulatory Visit: Payer: Self-pay

## 2017-06-30 VITALS — BP 144/89 | HR 59 | Temp 98.4°F | Resp 18 | Ht 73.0 in | Wt 231.3 lb

## 2017-06-30 DIAGNOSIS — C20 Malignant neoplasm of rectum: Secondary | ICD-10-CM

## 2017-06-30 DIAGNOSIS — Z23 Encounter for immunization: Secondary | ICD-10-CM

## 2017-06-30 DIAGNOSIS — I10 Essential (primary) hypertension: Secondary | ICD-10-CM | POA: Diagnosis not present

## 2017-06-30 DIAGNOSIS — C7972 Secondary malignant neoplasm of left adrenal gland: Secondary | ICD-10-CM | POA: Diagnosis not present

## 2017-06-30 DIAGNOSIS — D509 Iron deficiency anemia, unspecified: Secondary | ICD-10-CM | POA: Diagnosis not present

## 2017-06-30 DIAGNOSIS — E291 Testicular hypofunction: Secondary | ICD-10-CM | POA: Diagnosis not present

## 2017-06-30 LAB — CBC WITH DIFFERENTIAL/PLATELET
BASO%: 0.6 % (ref 0.0–2.0)
Basophils Absolute: 0 10*3/uL (ref 0.0–0.1)
EOS%: 3.1 % (ref 0.0–7.0)
Eosinophils Absolute: 0.2 10*3/uL (ref 0.0–0.5)
HCT: 44.2 % (ref 38.4–49.9)
HGB: 15.3 g/dL (ref 13.0–17.1)
LYMPH%: 31.3 % (ref 14.0–49.0)
MCH: 34.5 pg — ABNORMAL HIGH (ref 27.2–33.4)
MCHC: 34.6 g/dL (ref 32.0–36.0)
MCV: 99.7 fL — ABNORMAL HIGH (ref 79.3–98.0)
MONO#: 0.5 10*3/uL (ref 0.1–0.9)
MONO%: 8.1 % (ref 0.0–14.0)
NEUT#: 3.6 10*3/uL (ref 1.5–6.5)
NEUT%: 56.9 % (ref 39.0–75.0)
Platelets: 160 10*3/uL (ref 140–400)
RBC: 4.43 10*6/uL (ref 4.20–5.82)
RDW: 15.7 % — ABNORMAL HIGH (ref 11.0–14.6)
WBC: 6.3 10*3/uL (ref 4.0–10.3)
lymph#: 2 10*3/uL (ref 0.9–3.3)

## 2017-06-30 LAB — COMPREHENSIVE METABOLIC PANEL
ALBUMIN: 3.7 g/dL (ref 3.5–5.0)
ALT: 15 U/L (ref 0–55)
AST: 17 U/L (ref 5–34)
Alkaline Phosphatase: 75 U/L (ref 40–150)
Anion Gap: 8 mEq/L (ref 3–11)
BUN: 11.3 mg/dL (ref 7.0–26.0)
CHLORIDE: 108 meq/L (ref 98–109)
CO2: 24 meq/L (ref 22–29)
Calcium: 8.9 mg/dL (ref 8.4–10.4)
Creatinine: 0.8 mg/dL (ref 0.7–1.3)
EGFR: >60 mL/min/{1.73_m2} (ref >60)
GLUCOSE: 117 mg/dL (ref 70–140)
POTASSIUM: 4 meq/L (ref 3.5–5.1)
SODIUM: 140 meq/L (ref 136–145)
Total Bilirubin: 0.84 mg/dL (ref 0.20–1.20)
Total Protein: 6.7 g/dL (ref 6.4–8.3)

## 2017-06-30 LAB — CEA (IN HOUSE-CHCC): CEA (CHCC-In House): 3.03 ng/mL (ref 0.00–5.00)

## 2017-06-30 MED ORDER — HEPARIN SOD (PORK) LOCK FLUSH 100 UNIT/ML IV SOLN
500.0000 [IU] | Freq: Once | INTRAVENOUS | Status: AC | PRN
  Administered 2017-06-30: 500 [IU] via INTRAVENOUS
  Filled 2017-06-30: qty 5

## 2017-06-30 MED ORDER — SODIUM CHLORIDE 0.9 % IJ SOLN
10.0000 mL | INTRAMUSCULAR | Status: DC | PRN
  Administered 2017-06-30: 10 mL via INTRAVENOUS
  Filled 2017-06-30: qty 10

## 2017-06-30 MED ORDER — SILDENAFIL CITRATE 50 MG PO TABS: 50.0000 mg | ORAL_TABLET | ORAL | 0 refills | Status: DC | PRN

## 2017-06-30 MED ORDER — INFLUENZA VAC SPLIT QUAD 0.5 ML IM SUSY
0.5000 mL | PREFILLED_SYRINGE | Freq: Once | INTRAMUSCULAR | Status: AC
  Administered 2017-06-30: 0.5 mL via INTRAMUSCULAR
  Filled 2017-06-30: qty 0.5

## 2017-06-30 NOTE — Patient Instructions (Signed)

## 2017-06-30 NOTE — Progress Notes (Signed)
[place] OFFICE PROGRESS NOTE   Diagnosis: Rectal cancer  INTERVAL HISTORY:   [name] returns as scheduled. He continues Xeloda. No mouth sores or diarrhea. He continues to have tingling in the feet. He is working. Good appetite. He reports erectile dysfunction since he underwent prostate surgery. He request a trial of sildenafil.  Objective:  Vital signs in last 24 hours:  Blood pressure (!) 144/89, pulse (!) 59, temperature 98.4 F (36.9 C), temperature source Oral, resp. rate 18, height 6' 1"  (1.854 m), weight 231 lb 4.8 oz (104.9 kg), SpO2 99 %.    HEENT: No thrush or ulcers, mild erythema at the palate Lymphatics: No  axillary, or inguinal nodes Resp: Lungs clear bilaterally Cardio: Regular rate and rhythm GI: No hepatomegaly, right lower quadrant urostomy, left lower quadrant colostomy, midline scar without evidence of recurrent tumor Vascular: No leg edema  Skin: Palms without erythema or skin breakdown   Portacath/PICC-without erythema  Lab Results:  Lab Results  Component Value Date   WBC 6.3 06/30/2017   HGB 15.3 06/30/2017   HCT 44.2 06/30/2017   MCV 99.7 (H) 06/30/2017   PLT 160 06/30/2017   NEUTROABS 3.6 06/30/2017    CMP     Component Value Date/Time   NA 140 06/30/2017 0755   K 4.0 06/30/2017 0755   CL 107 04/16/2014 0920   CO2 24 06/30/2017 0755   GLUCOSE 117 06/30/2017 0755   BUN 11.3 06/30/2017 0755   CREATININE 0.8 06/30/2017 0755   CALCIUM 8.9 06/30/2017 0755   PROT 6.7 06/30/2017 0755   ALBUMIN 3.7 06/30/2017 0755   AST 17 06/30/2017 0755   ALT 15 06/30/2017 0755   ALKPHOS 75 06/30/2017 0755   BILITOT 0.84 06/30/2017 0755   GFRNONAA >90 11/09/2013 0445   GFRAA >90 11/09/2013 0445    Lab Results  Component Value Date   CEA1 3.09 04/07/2017    Medications: I have reviewed the patient's current medications.  Assessment/Plan: 1. Clinical stage IV (N4M,H6K,G8) adenocarcinoma of the rectum with tumor directly  extending to the bladder/prostate and CT scan evidence of metastatic chest adenopathy  Positive K-ras mutation.   Normal mismatch repair protein expression. Microsatellite stable   Status post low anterior resection and end colostomy with a cystoprostatectomy and colon conduit urinary diversion 11/01/2013.   Staging PET scan with hypermetabolic pelvic nodes, mediastinal nodes, left adrenal metastasis, and left upper lobe nodule.   Initiation of FOLFOX 12/25/2013.   Restaging CT 03/16/2014 after 6 cycles of FOLFOX confirmed improvement in chest/pelvic lymphadenopathy, a left upper lobe nodule, and resolution of a left adrenal nodule   Oxaliplatin deleted from cycle 8 FOLFOX 04/02/2014 secondary to neuropathy and thrombocytopenia.   Oxaliplatin held with cycle 9 FOLFOX 04/16/2014 due to neuropathy.   Cycle 10 FOLFOX 04/30/2014.   Cycle 11 FOLFOX 05/15/2014.   Cycle 12 FOLFOX 05/28/2014. Oxaliplatin held due to increased neuropathy.   Restaging CT evaluation 06/08/2014 with stable mediastinal and hilar adenopathy. Stable borderline left iliac lymph node. No new or progressive disease identified within the chest, abdomen or pelvis.   "Maintenance" 5-fluorouracil beginning 06/11/2014.   Restaging CT evaluation 10/10/2014 showed similar mild mediastinal and right hilar adenopathy. No visible recurrence of the prior left upper lobe metastatic lesion or the left adrenal metastatic lesion. No new lesions noted.   Maintenance Xeloda on a 7 day on/7 day off schedule beginning 10/25/2014   Restaging CTs 03/04/2015 with stable mediastinal lymph nodes and no evidence of metastatic disease  Xeloda continued   Xeloda dose reduced beginning 04/15/2015 due to progressive hand-foot syndrome.   Xeloda placed on hold 05/15/2015 due to continued hand/foot pain.   Xeloda resumed 06/10/2015.   Restaging CTs 08/09/2015 with no evidence of disease progression   Continuation of  maintenance Xeloda  CTs 08/03/2016-no evidence of metastatic disease, no pathologic chest lymphadenopathy  Continuation of maintenance Xeloda 2. Microcytic anemia-likely iron deficiency, improved.  3. Gout. 4. Port-A-Cath placement 12/18/2013.  5. Anxiety. He takes Xanax as needed. 6. History of left knee pain and swelling. 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 Trial of Cymbalta initiated 07/09/2014. Trial of amitriptyline initiated 12/11/2015. 8. History of Thrombocytopenia secondary to chemotherapy. 9. Skin rash 04/30/2014-resolved with doxycycline 10. Chronic edema left lower leg/ankle. Negative Doppler 09/17/2014 11. Hand-foot syndrome secondary to Xeloda. Improved. 12. Enlargement of the left testicle 08/13/2015. He has been evaluated by urology. 13. Hypertension.Norvasc prescribed 05/13/2016. Resumed 10/28/2016. Reports not taking 12/02/2016 due to normal blood pressure outside of the office.Reports taking consistently 02/24/2017. 14. Right leg edema 04/07/2017. Doppler negative for DVT.   Disposition:  Austin Robbins appears unchanged. The plan is to continue Xeloda on the current schedule. He will undergo restaging CTs prior to an office visit in 6 weeks.  He received an influenza vaccine today.  He was prescribed sildenafil. He will schedule an appointment with Dr. Tresa Moore if this does not help the erectile dysfunction.  Donneta Romberg, MD  06/30/2017  9:05 AM

## 2017-06-30 NOTE — Telephone Encounter (Signed)
Scheduled appt oer 10/24 los - Gave patient AVS and calender per los. - Central radiology to contact patient with ct schedule.

## 2017-07-07 ENCOUNTER — Other Ambulatory Visit: Payer: Self-pay | Admitting: *Deleted

## 2017-07-07 DIAGNOSIS — C20 Malignant neoplasm of rectum: Secondary | ICD-10-CM

## 2017-07-07 MED ORDER — CAPECITABINE 500 MG PO TABS: ORAL_TABLET | ORAL | 0 refills | Status: DC

## 2017-07-14 DIAGNOSIS — Z933 Colostomy status: Secondary | ICD-10-CM | POA: Diagnosis not present

## 2017-07-14 DIAGNOSIS — Z436 Encounter for attention to other artificial openings of urinary tract: Secondary | ICD-10-CM | POA: Diagnosis not present

## 2017-07-14 DIAGNOSIS — Z936 Other artificial openings of urinary tract status: Secondary | ICD-10-CM | POA: Diagnosis not present

## 2017-07-14 DIAGNOSIS — Z433 Encounter for attention to colostomy: Secondary | ICD-10-CM | POA: Diagnosis not present

## 2017-08-09 ENCOUNTER — Telehealth: Payer: Self-pay | Admitting: *Deleted

## 2017-08-09 ENCOUNTER — Ambulatory Visit (HOSPITAL_COMMUNITY)
Admission: RE | Admit: 2017-08-09 | Discharge: 2017-08-09 | Disposition: A | Discharge status: Home / Self Care | Payer: BLUE CROSS/BLUE SHIELD | Source: Ambulatory Visit | Attending: Oncology | Admitting: Oncology

## 2017-08-09 ENCOUNTER — Other Ambulatory Visit (HOSPITAL_BASED_OUTPATIENT_CLINIC_OR_DEPARTMENT_OTHER): Payer: BLUE CROSS/BLUE SHIELD

## 2017-08-09 ENCOUNTER — Ambulatory Visit: Payer: BLUE CROSS/BLUE SHIELD

## 2017-08-09 DIAGNOSIS — C218 Malignant neoplasm of overlapping sites of rectum, anus and anal canal: Secondary | ICD-10-CM | POA: Diagnosis not present

## 2017-08-09 DIAGNOSIS — I700 Atherosclerosis of aorta: Secondary | ICD-10-CM | POA: Insufficient documentation

## 2017-08-09 DIAGNOSIS — C7972 Secondary malignant neoplasm of left adrenal gland: Secondary | ICD-10-CM

## 2017-08-09 DIAGNOSIS — C20 Malignant neoplasm of rectum: Secondary | ICD-10-CM

## 2017-08-09 DIAGNOSIS — Z9889 Other specified postprocedural states: Secondary | ICD-10-CM | POA: Diagnosis not present

## 2017-08-09 LAB — COMPREHENSIVE METABOLIC PANEL
ALT: 12 U/L (ref 0–55)
AST: 13 U/L (ref 5–34)
Albumin: 3.6 g/dL (ref 3.5–5.0)
Alkaline Phosphatase: 72 U/L (ref 40–150)
Anion Gap: 8 mEq/L (ref 3–11)
BILIRUBIN TOTAL: 0.42 mg/dL (ref 0.20–1.20)
BUN: 11.5 mg/dL (ref 7.0–26.0)
CHLORIDE: 110 meq/L — AB (ref 98–109)
CO2: 21 meq/L — AB (ref 22–29)
Calcium: 8.9 mg/dL (ref 8.4–10.4)
Creatinine: 0.8 mg/dL (ref 0.7–1.3)
GLUCOSE: 97 mg/dL (ref 70–140)
Potassium: 3.8 mEq/L (ref 3.5–5.1)
SODIUM: 139 meq/L (ref 136–145)
TOTAL PROTEIN: 6.7 g/dL (ref 6.4–8.3)

## 2017-08-09 LAB — CBC WITH DIFFERENTIAL/PLATELET
BASO%: 0.2 % (ref 0.0–2.0)
BASOS ABS: 0 10*3/uL (ref 0.0–0.1)
EOS ABS: 0.2 10*3/uL (ref 0.0–0.5)
EOS%: 2.6 % (ref 0.0–7.0)
HEMATOCRIT: 42 % (ref 38.4–49.9)
HGB: 14.7 g/dL (ref 13.0–17.1)
LYMPH#: 1.7 10*3/uL (ref 0.9–3.3)
LYMPH%: 27.7 % (ref 14.0–49.0)
MCH: 34.8 pg — AB (ref 27.2–33.4)
MCHC: 35 g/dL (ref 32.0–36.0)
MCV: 99.3 fL — AB (ref 79.3–98.0)
MONO#: 0.5 10*3/uL (ref 0.1–0.9)
MONO%: 8.7 % (ref 0.0–14.0)
NEUT#: 3.8 10*3/uL (ref 1.5–6.5)
NEUT%: 60.8 % (ref 39.0–75.0)
PLATELETS: 161 10*3/uL (ref 140–400)
RBC: 4.23 10*6/uL (ref 4.20–5.82)
RDW: 14.6 % (ref 11.0–14.6)
WBC: 6.2 10*3/uL (ref 4.0–10.3)

## 2017-08-09 LAB — CEA (IN HOUSE-CHCC): CEA (CHCC-In House): 2.83 ng/mL (ref 0.00–5.00)

## 2017-08-09 MED ORDER — HEPARIN SOD (PORK) LOCK FLUSH 100 UNIT/ML IV SOLN
500.0000 [IU] | Freq: Once | INTRAVENOUS | Status: AC
  Administered 2017-08-09: 500 [IU] via INTRAVENOUS

## 2017-08-09 MED ORDER — IOPAMIDOL (ISOVUE-300) INJECTION 61%
INTRAVENOUS | Status: AC
  Filled 2017-08-09: qty 100

## 2017-08-09 MED ORDER — HEPARIN SOD (PORK) LOCK FLUSH 100 UNIT/ML IV SOLN
INTRAVENOUS | Status: AC
  Administered 2017-08-09: 500 [IU] via INTRAVENOUS
  Filled 2017-08-09: qty 5

## 2017-08-09 MED ORDER — IOPAMIDOL (ISOVUE-300) INJECTION 61%
100.0000 mL | Freq: Once | INTRAVENOUS | Status: AC | PRN
  Administered 2017-08-09: 100 mL via INTRAVENOUS

## 2017-08-09 MED ORDER — SODIUM CHLORIDE 0.9 % IJ SOLN
10.0000 mL | INTRAMUSCULAR | Status: DC | PRN
  Administered 2017-08-09: 10 mL via INTRAVENOUS
  Filled 2017-08-09: qty 10

## 2017-08-09 NOTE — Patient Instructions (Signed)

## 2017-08-09 NOTE — Telephone Encounter (Signed)
Message from [facility]: Pt has questions about when to drink contrast. Returned call to pt, instructions given, appointments clarified. He voiced understanding.

## 2017-08-10 ENCOUNTER — Other Ambulatory Visit: Payer: Self-pay | Admitting: *Deleted

## 2017-08-10 DIAGNOSIS — C20 Malignant neoplasm of rectum: Secondary | ICD-10-CM

## 2017-08-10 MED ORDER — CAPECITABINE 500 MG PO TABS: ORAL_TABLET | ORAL | 5 refills | Status: DC

## 2017-08-11 ENCOUNTER — Ambulatory Visit (HOSPITAL_BASED_OUTPATIENT_CLINIC_OR_DEPARTMENT_OTHER): Payer: BLUE CROSS/BLUE SHIELD | Admitting: Nurse Practitioner

## 2017-08-11 ENCOUNTER — Encounter: Payer: Self-pay | Admitting: Nurse Practitioner

## 2017-08-11 ENCOUNTER — Telehealth: Payer: Self-pay | Admitting: Oncology

## 2017-08-11 VITALS — BP 140/81 | HR 58 | Temp 98.6°F | Resp 18 | Wt 239.4 lb

## 2017-08-11 DIAGNOSIS — C20 Malignant neoplasm of rectum: Secondary | ICD-10-CM | POA: Diagnosis not present

## 2017-08-11 DIAGNOSIS — C7972 Secondary malignant neoplasm of left adrenal gland: Secondary | ICD-10-CM | POA: Diagnosis not present

## 2017-08-11 DIAGNOSIS — I10 Essential (primary) hypertension: Secondary | ICD-10-CM | POA: Diagnosis not present

## 2017-08-11 MED ORDER — AMLODIPINE BESYLATE 5 MG PO TABS: 5.0000 mg | ORAL_TABLET | Freq: Every day | ORAL | 1 refills | Status: DC

## 2017-08-11 MED ORDER — ALPRAZOLAM 1 MG PO TABS: ORAL_TABLET | ORAL | 1 refills | Status: DC

## 2017-08-11 NOTE — Progress Notes (Addendum)
[place] OFFICE PROGRESS NOTE   Diagnosis: Rectal cancer  INTERVAL HISTORY:   Austin Robbins returns as scheduled.  He continues Xeloda.  He denies nausea/vomiting.  No mouth sores.  No diarrhea.  No hand or foot pain or redness.  Several days ago he noted pain at the upper gumline, then at the lower gumline.  The pain has resolved.  He has a good appetite.  Objective:  Vital signs in last 24 hours:  Blood pressure 140/81, pulse (!) 58, temperature 98.6 F (37 C), temperature source Oral, resp. rate 18, weight 239 lb 6.4 oz (108.6 kg), SpO2 97 %.    HEENT: No thrush or ulcers. Lymphatics: No palpable cervical or supraclavicular lymph nodes. Resp: Lungs clear bilaterally. Cardio: Regular rate and rhythm. GI: Abdomen soft and nontender.  No hepatomegaly.  Right lower quadrant urostomy.  Left lower quadrant colostomy. Vascular: Trace bilateral pretibial/ankle edema. Skin: Palms with a mild dry appearance. Port-A-Cath without erythema.   Lab Results:  Lab Results  Component Value Date   WBC 6.2 08/09/2017   HGB 14.7 08/09/2017   HCT 42.0 08/09/2017   MCV 99.3 (H) 08/09/2017   PLT 161 08/09/2017   NEUTROABS 3.8 08/09/2017    Imaging:  Ct Chest W Contrast  Result Date: 08/09/2017 CLINICAL DATA:  Clinical stage IV (N4B,S9G,G8) adenocarcinoma of the rectum with tumor directly extending to the bladder/prostate and CT scan evidence of metastatic chest adenopathy. Status post low anterior resection and end colostomy with a cystoprostatectomy and colon conduit urinary diversion 11/01/2013. EXAM: CT CHEST, ABDOMEN, AND PELVIS WITH CONTRAST TECHNIQUE: Multidetector CT imaging of the chest, abdomen and pelvis was performed following the standard protocol during bolus administration of intravenous contrast. CONTRAST:  14m ISOVUE-300 IOPAMIDOL (ISOVUE-300) INJECTION 61% COMPARISON:  08/03/2016. FINDINGS: CT CHEST FINDINGS Cardiovascular: The heart size is normal. No  pericardial effusion. Coronary artery calcification is evident. Left Port-A-Cath tip is positioned in the mid SVC. Mediastinum/Nodes: Scattered small mediastinal and hilar lymph nodes are seen as before, unchanged in the interval and normal to upper normal for size. The esophagus has normal imaging features. There is no axillary lymphadenopathy. Lungs/Pleura: No focal airspace consolidation. No suspicious pulmonary nodule or mass. No pleural effusion. Musculoskeletal: Bone windows reveal no worrisome lytic or sclerotic osseous lesions. CT ABDOMEN PELVIS FINDINGS Hepatobiliary: No focal abnormality within the liver parenchyma. Gallbladder decompressed. No intrahepatic or extrahepatic biliary dilation. Pancreas: No focal mass lesion. No dilatation of the main duct. No intraparenchymal cyst. No peripancreatic edema. Spleen: No splenomegaly. No focal mass lesion. Adrenals/Urinary Tract: No adrenal nodule or mass. Kidneys are unremarkable. No hydroureteronephrosis. Urinary conduit to the right lower quadrant is stable. Stomach/Bowel: Stomach is nondistended. No gastric wall thickening. No evidence of outlet obstruction. Duodenum is normally positioned as is the ligament of Treitz. No small bowel wall thickening. No small bowel dilatation. The terminal ileum is normal. The appendix is normal. Diverticuli are seen scattered along the entire length of the colon without CT findings of diverticulitis. Left abdominal sigmoid end colostomy noted. Vascular/Lymphatic: There is abdominal aortic atherosclerosis without aneurysm. There is no gastrohepatic or hepatoduodenal ligament lymphadenopathy. No intraperitoneal or retroperitoneal lymphadenopathy. No pelvic sidewall lymphadenopathy. Prominent lymph nodes in each groin region are stable in the interval. Reproductive: Status post prostatectomy. Other: No intraperitoneal free fluid. Musculoskeletal: Bone windows reveal no worrisome lytic or sclerotic osseous lesions. IMPRESSION:  1. Stable exam. No evidence for metastatic disease in the chest, abdomen, or pelvis. No evidence for local recurrence in  the pelvis in this patient status post low anterior resection with cystoprostatectomy. 2.  Aortic Atherosclerois (ICD10-170.0) Electronically Signed   By: Misty Stanley M.D.   On: 08/09/2017 16:06   Ct Abdomen Pelvis W Contrast  Result Date: 08/09/2017 CLINICAL DATA:  Clinical stage IV (Q5Z,D6L,O7) adenocarcinoma of the rectum with tumor directly extending to the bladder/prostate and CT scan evidence of metastatic chest adenopathy. Status post low anterior resection and end colostomy with a cystoprostatectomy and colon conduit urinary diversion 11/01/2013. EXAM: CT CHEST, ABDOMEN, AND PELVIS WITH CONTRAST TECHNIQUE: Multidetector CT imaging of the chest, abdomen and pelvis was performed following the standard protocol during bolus administration of intravenous contrast. CONTRAST:  138m ISOVUE-300 IOPAMIDOL (ISOVUE-300) INJECTION 61% COMPARISON:  08/03/2016. FINDINGS: CT CHEST FINDINGS Cardiovascular: The heart size is normal. No pericardial effusion. Coronary artery calcification is evident. Left Port-A-Cath tip is positioned in the mid SVC. Mediastinum/Nodes: Scattered small mediastinal and hilar lymph nodes are seen as before, unchanged in the interval and normal to upper normal for size. The esophagus has normal imaging features. There is no axillary lymphadenopathy. Lungs/Pleura: No focal airspace consolidation. No suspicious pulmonary nodule or mass. No pleural effusion. Musculoskeletal: Bone windows reveal no worrisome lytic or sclerotic osseous lesions. CT ABDOMEN PELVIS FINDINGS Hepatobiliary: No focal abnormality within the liver parenchyma. Gallbladder decompressed. No intrahepatic or extrahepatic biliary dilation. Pancreas: No focal mass lesion. No dilatation of the main duct. No intraparenchymal cyst. No peripancreatic edema. Spleen: No splenomegaly. No focal mass lesion.  Adrenals/Urinary Tract: No adrenal nodule or mass. Kidneys are unremarkable. No hydroureteronephrosis. Urinary conduit to the right lower quadrant is stable. Stomach/Bowel: Stomach is nondistended. No gastric wall thickening. No evidence of outlet obstruction. Duodenum is normally positioned as is the ligament of Treitz. No small bowel wall thickening. No small bowel dilatation. The terminal ileum is normal. The appendix is normal. Diverticuli are seen scattered along the entire length of the colon without CT findings of diverticulitis. Left abdominal sigmoid end colostomy noted. Vascular/Lymphatic: There is abdominal aortic atherosclerosis without aneurysm. There is no gastrohepatic or hepatoduodenal ligament lymphadenopathy. No intraperitoneal or retroperitoneal lymphadenopathy. No pelvic sidewall lymphadenopathy. Prominent lymph nodes in each groin region are stable in the interval. Reproductive: Status post prostatectomy. Other: No intraperitoneal free fluid. Musculoskeletal: Bone windows reveal no worrisome lytic or sclerotic osseous lesions. IMPRESSION: 1. Stable exam. No evidence for metastatic disease in the chest, abdomen, or pelvis. No evidence for local recurrence in the pelvis in this patient status post low anterior resection with cystoprostatectomy. 2.  Aortic Atherosclerois (ICD10-170.0) Electronically Signed   By: EMisty StanleyM.D.   On: 08/09/2017 16:06    Medications: I have reviewed the patient's current medications.  Assessment/Plan: 1. Clinical stage IV ((F6E,P3I,R5 adenocarcinoma of the rectum with tumor directly extending to the bladder/prostate and CT scan evidence of metastatic chest adenopathy  Positive K-ras mutation.   Normal mismatch repair protein expression. Microsatellite stable   Status post low anterior resection and end colostomy with a cystoprostatectomy and colon conduit urinary diversion 11/01/2013.   Staging PET scan with hypermetabolic pelvic nodes,  mediastinal nodes, left adrenal metastasis, and left upper lobe nodule.   Initiation of FOLFOX 12/25/2013.   Restaging CT 03/16/2014 after 6 cycles of FOLFOX confirmed improvement in chest/pelvic lymphadenopathy, a left upper lobe nodule, and resolution of a left adrenal nodule   Oxaliplatin deleted from cycle 8 FOLFOX 04/02/2014 secondary to neuropathy and thrombocytopenia.   Oxaliplatin held with cycle 9 FOLFOX 04/16/2014 due to neuropathy.  Cycle 10 FOLFOX 04/30/2014.   Cycle 11 FOLFOX 05/15/2014.   Cycle 12 FOLFOX 05/28/2014. Oxaliplatin held due to increased neuropathy.   Restaging CT evaluation 06/08/2014 with stable mediastinal and hilar adenopathy. Stable borderline left iliac lymph node. No new or progressive disease identified within the chest, abdomen or pelvis.   "Maintenance" 5-fluorouracil beginning 06/11/2014.   Restaging CT evaluation 10/10/2014 showed similar mild mediastinal and right hilar adenopathy. No visible recurrence of the prior left upper lobe metastatic lesion or the left adrenal metastatic lesion. No new lesions noted.   Maintenance Xeloda on a 7 day on/7 day off schedule beginning 10/25/2014   Restaging CTs 03/04/2015 with stable mediastinal lymph nodes and no evidence of metastatic disease   Xeloda continued   Xeloda dose reduced beginning 04/15/2015 due to progressive hand-foot syndrome.   Xeloda placed on hold 05/15/2015 due to continued hand/foot pain.   Xeloda resumed 06/10/2015.   Restaging CTs 08/09/2015 with no evidence of disease progression   Continuation of maintenance Xeloda  CTs 08/03/2016-no evidence of metastatic disease, no pathologic chest lymphadenopathy  Continuation of maintenance Xeloda  CTs 08/09/2017-no evidence for metastatic disease in the chest, abdomen or pelvis.  No evidence for local recurrence in the pelvis. 2. Microcytic anemia-likely iron deficiency, improved.  3. Gout. 4. Port-A-Cath  placement 12/18/2013.  5. Anxiety. He takes Xanax as needed. 6. History of left knee pain and swelling. 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 Trial of Cymbalta initiated 07/09/2014. Trial of amitriptyline initiated 12/11/2015. 8. History of Thrombocytopenia secondary to chemotherapy. 9. Skin rash 04/30/2014-resolved with doxycycline 10. Chronic edema left lower leg/ankle. Negative Doppler 09/17/2014 11. Hand-foot syndrome secondary to Xeloda. Improved. 12. Enlargement of the left testicle 08/13/2015. He has been evaluated by urology. 13. Hypertension.Norvasc prescribed 05/13/2016. Resumed 10/28/2016. Reports not taking 12/02/2016 due to normal blood pressure outside of the office.Reports taking consistently 02/24/2017. 14. Right leg edema 04/07/2017. Doppler negative for DVT.   Disposition: Austin Robbins appears well.  He has been on maintenance therapy since 06/11/2014.  He remains in clinical and radiographic remission.  Mr. Bochenek and his wife understand there is no clear answer as to whether or not to continue maintenance therapy.  The decision was made to discontinue Xeloda at this time and follow clinically with routine labs and office visits.  Mr. Gintz is in agreement with this plan.  We are referring him for removal of the Port-A-Cath.  He will return for labs and a follow-up visit in 3 months.  He will contact the office in the interim with any problems.  Patient seen with Dr. Benay Spice.   [name] ANP/GNP-BC   08/11/2017  9:44 AM This was a shared visit with [name].  Austin Robbins remains in clinical remission from rectal cancer.  He has been without evidence of disease while on Xeloda for the past several years.  We discussed the indication for continuing Xeloda with Mr. Potvin and his wife.  It is not clear whether he is receiving continued benefit from the Xeloda.  We decided to stop Xeloda today.  We  will follow his clinical status and CEA.  Julieanne Manson, MD

## 2017-08-11 NOTE — Telephone Encounter (Signed)
Gave avs and calendar for march 2019 patient request

## 2017-09-10 DIAGNOSIS — Z933 Colostomy status: Secondary | ICD-10-CM | POA: Diagnosis not present

## 2017-09-10 DIAGNOSIS — Z436 Encounter for attention to other artificial openings of urinary tract: Secondary | ICD-10-CM | POA: Diagnosis not present

## 2017-09-10 DIAGNOSIS — Z433 Encounter for attention to colostomy: Secondary | ICD-10-CM | POA: Diagnosis not present

## 2017-09-10 DIAGNOSIS — Z936 Other artificial openings of urinary tract status: Secondary | ICD-10-CM | POA: Diagnosis not present

## 2017-09-13 ENCOUNTER — Telehealth: Payer: Self-pay

## 2017-09-13 NOTE — Telephone Encounter (Signed)
Returned call to patient wife regarding port removal. Informed wife that I spoke with [facility] and they received the referral for the port removal and will schedule appointment. Wife voiced understanding.

## 2017-09-20 ENCOUNTER — Ambulatory Visit: Payer: Self-pay | Admitting: General Surgery

## 2017-09-20 DIAGNOSIS — C20 Malignant neoplasm of rectum: Secondary | ICD-10-CM | POA: Diagnosis not present

## 2017-09-20 NOTE — H&P (Signed)
62 y.o. M who has completed IV chemo for stage 4 rectal cancer.  He is here for port removal.  Past Medical History:  Diagnosis Date  . Adenocarcinoma of rectum [facility]) 10/25/2013   62/18/2015 colonoscopy  . Allergy   . Anemia   . Colon cancer ([place]) 62/27/15  . Gastritis   . H. pylori infection 62/09/2012  . Hypertension 05/13/2016  . Panic attacks   . Personal history of colonic polyps - adenoma 10/25/2013  . S/P colostomy [facility])    Past Surgical History:  Procedure Laterality Date  . APPLICATION OF WOUND VAC N/A 62/27/2015   Procedure: APPLICATION OF WOUND VAC;  Surgeon: Leighton Ruff, MD;  Location: [facility];  Service: General;  Laterality: N/A;  . BOWEL RESECTION N/A 62/27/2015   Procedure: OPEN Viviann Spare Greig Castilla RESECTION/COLOSTOMY;  Surgeon: Leighton Ruff, MD;  Location: [facility];  Service: General;  Laterality: N/A;  . CYSTECTOMY W/ URETEROSIGMOIDOSTOMY  62/28/2015  . CYSTOSCOPY WITH URETEROSCOPY AND STENT PLACEMENT Bilateral 62/27/2015   Procedure: CYSTOSCOPY WITH RIGHT RETROGRADE STENT PLACEMENT , bladder biopsy,TOTAL PROSTATE WITH ENBLOCK CYSTECTOMY AND PROSTATECTOMY/ COLON CONDUIT URINARY DIVERSION/ INSERTION BILATERAL STENTS/ ILEOSTOMY;  Surgeon: Alexis Frock, MD;  Location: [facility];  Service: Urology;  Laterality: Bilateral;  bladder biopsy  . LOW ANTERIOR BOWEL RESECTION  11/04/2013  . PORTACATH PLACEMENT Left 62/13/2015   Procedure: INSERTION PORT-A-CATH;  Surgeon: Leighton Ruff, MD;  Location: [place];  Service: General;  Laterality: Left;  . TONSILLECTOMY     Social History   Socioeconomic History  . Marital status: Married    Spouse name: Not on file  . Number of children: 2  . Years of education: Not on file  . Highest education level: Not on file  Social Needs  . Financial resource strain: Not on file  . Food insecurity - worry: Not on file  . Food insecurity - inability: Not on file  . Transportation needs - medical: Not on file  . Transportation needs  - non-medical: Not on file  Occupational History  . Occupation: Architect  Tobacco Use  . Smoking status: Never Smoker  . Smokeless tobacco: Never Used  Substance and Sexual Activity  . Alcohol use: No    Alcohol/week: 0.0 oz    Comment: no Alcohol since September  . Drug use: No  . Sexual activity: Not on file  Other Topics Concern  . 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 + children  Never smoker. 2 caffeine drinks per day. Prior regular fairly heavy alcohol until recently as of 10/06/2013.      Family History  Problem Relation Age of Onset  . Healthy Mother   . COPD Mother   . Lung cancer Father        smoker   Allergies  Allergen Reactions  . Codeine Other (See Comments)    Chest pain   No outpatient medications have been marked as taking for the 09/20/17 encounter (Pre-op/Pre-procedure Orders) with Leighton Ruff, MD.   Review of Systems - General ROS: negative for - chills or fever Respiratory ROS: no cough, shortness of breath, or wheezing Cardiovascular ROS: no chest pain or dyspnea on exertion Gastrointestinal ROS: no abdominal pain, change in bowel habits, or black or bloody stools Genito-Urinary ROS: no dysuria, trouble voiding, or hematuria  Physical Exam  Constitutional: He appears well-developed and well-nourished.  HENT:  Head: Normocephalic and atraumatic.  Eyes: Conjunctivae and EOM are normal. Pupils are  equal, round, and reactive to light.  Neck: Normal range of motion.  Cardiovascular: Normal rate and regular rhythm.  Pulmonary/Chest: Effort normal and breath sounds normal.  Abdominal: Soft. He exhibits no distension. There is no tenderness.  Musculoskeletal: Normal range of motion.    62 y.o. M with stage 4 rectal cancer and NED.  It has been recommended by his oncologist that he have his port removed.  He has been on maintenance Xeloda which he has now stopped as well.  Risks or port removal  including bleeding and infection were discussed.  All questions were answered.

## 2017-09-20 NOTE — H&P (View-Only) (Signed)
62 y.o. M who has completed IV chemo for stage 4 rectal cancer.  He is here for port removal.  Past Medical History:  Diagnosis Date  . Adenocarcinoma of rectum [facility]) 10/25/2013   10/25/2013 colonoscopy  . Allergy   . Anemia   . Colon cancer ([place]) 11/03/13  . Gastritis   . H. pylori infection 08/07/2013  . Hypertension 05/13/2016  . Panic attacks   . Personal history of colonic polyps - adenoma 10/25/2013  . S/P colostomy [facility])    Past Surgical History:  Procedure Laterality Date  . APPLICATION OF WOUND VAC N/A 11/03/2013   Procedure: APPLICATION OF WOUND VAC;  Surgeon: Leighton Ruff, MD;  Location: [facility];  Service: General;  Laterality: N/A;  . BOWEL RESECTION N/A 11/03/2013   Procedure: OPEN Viviann Spare Greig Castilla RESECTION/COLOSTOMY;  Surgeon: Leighton Ruff, MD;  Location: [facility];  Service: General;  Laterality: N/A;  . CYSTECTOMY W/ URETEROSIGMOIDOSTOMY  11/04/2013  . CYSTOSCOPY WITH URETEROSCOPY AND STENT PLACEMENT Bilateral 11/03/2013   Procedure: CYSTOSCOPY WITH RIGHT RETROGRADE STENT PLACEMENT , bladder biopsy,TOTAL PROSTATE WITH ENBLOCK CYSTECTOMY AND PROSTATECTOMY/ COLON CONDUIT URINARY DIVERSION/ INSERTION BILATERAL STENTS/ ILEOSTOMY;  Surgeon: Alexis Frock, MD;  Location: [facility];  Service: Urology;  Laterality: Bilateral;  bladder biopsy  . LOW ANTERIOR BOWEL RESECTION  11/04/2013  . PORTACATH PLACEMENT Left 12/18/2013   Procedure: INSERTION PORT-A-CATH;  Surgeon: Leighton Ruff, MD;  Location: [place];  Service: General;  Laterality: Left;  . TONSILLECTOMY     Social History   Socioeconomic History  . Marital status: Married    Spouse name: Not on file  . Number of children: 2  . Years of education: Not on file  . Highest education level: Not on file  Social Needs  . Financial resource strain: Not on file  . Food insecurity - worry: Not on file  . Food insecurity - inability: Not on file  . Transportation needs - medical: Not on file  . Transportation needs  - non-medical: Not on file  Occupational History  . Occupation: Architect  Tobacco Use  . Smoking status: Never Smoker  . Smokeless tobacco: Never Used  Substance and Sexual Activity  . Alcohol use: No    Alcohol/week: 0.0 oz    Comment: no Alcohol since September  . Drug use: No  . Sexual activity: Not on file  Other Topics Concern  . 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 + children  Never smoker. 2 caffeine drinks per day. Prior regular fairly heavy alcohol until recently as of 10/06/2013.      Family History  Problem Relation Age of Onset  . Healthy Mother   . COPD Mother   . Lung cancer Father        smoker   Allergies  Allergen Reactions  . Codeine Other (See Comments)    Chest pain   No outpatient medications have been marked as taking for the 09/20/17 encounter (Pre-op/Pre-procedure Orders) with Leighton Ruff, MD.   Review of Systems - General ROS: negative for - chills or fever Respiratory ROS: no cough, shortness of breath, or wheezing Cardiovascular ROS: no chest pain or dyspnea on exertion Gastrointestinal ROS: no abdominal pain, change in bowel habits, or black or bloody stools Genito-Urinary ROS: no dysuria, trouble voiding, or hematuria  Physical Exam  Constitutional: He appears well-developed and well-nourished.  HENT:  Head: Normocephalic and atraumatic.  Eyes: Conjunctivae and EOM are normal. Pupils are  equal, round, and reactive to light.  Neck: Normal range of motion.  Cardiovascular: Normal rate and regular rhythm.  Pulmonary/Chest: Effort normal and breath sounds normal.  Abdominal: Soft. He exhibits no distension. There is no tenderness.  Musculoskeletal: Normal range of motion.    62 y.o. M with stage 4 rectal cancer and NED.  It has been recommended by his oncologist that he have his port removed.  He has been on maintenance Xeloda which he has now stopped as well.  Risks or port removal  including bleeding and infection were discussed.  All questions were answered.

## 2017-10-01 NOTE — Patient Instructions (Signed)
Austin Robbins  10/01/2017   Your procedure is scheduled on: 10-07-17   Report to [facility] Main  Entrance Report to admitting at 9:30 AM   Call this number if you have problems the morning of surgery [phone]   Remember: Do not eat food or drink liquids :After Midnight.     Take these medicines the morning of surgery with A SIP OF WATER: Amlodipine (Norvasc)                                You may not have any metal on your body including hair pins and              piercings  Do not wear jewelry, make-up, lotions, powders or perfumes, deodorant             Men may shave face and neck.   Do not bring valuables to the hospital. [place].  Contacts, dentures or bridgework may not be worn into surgery.     Patients discharged the day of surgery will not be allowed to drive home.  Name and phone number of your driver:  Special Instructions: N/A              Please read over the following fact sheets you were given: _____________________________________________________________________          [facility] - Preparing for Surgery Before surgery, you can play an important role.  Because skin is not sterile, your skin needs to be as free of germs as possible.  You can reduce the number of germs on your skin by washing with CHG (chlorahexidine gluconate) soap before surgery.  CHG is an antiseptic cleaner which kills germs and bonds with the skin to continue killing germs even after washing. Please DO NOT use if you have an allergy to CHG or antibacterial soaps.  If your skin becomes reddened/irritated stop using the CHG and inform your nurse when you arrive at Short Stay. Do not shave (including legs and underarms) for at least 48 hours prior to the first CHG shower.  You may shave your face/neck. Please follow these instructions carefully:  1.  Shower with CHG Soap the night before surgery and the  morning of  Surgery.  2.  If you choose to wash your hair, wash your hair first as usual with your  normal  shampoo.  3.  After you shampoo, rinse your hair and body thoroughly to remove the  shampoo.                           4.  Use CHG as you would any other liquid soap.  You can apply chg directly  to the skin and wash                       Gently with a scrungie or clean washcloth.  5.  Apply the CHG Soap to your body ONLY FROM THE NECK DOWN.   Do not use on face/ open                           Wound or open sores.  Avoid contact with eyes, ears mouth and genitals (private parts).                       Wash face,  Genitals (private parts) with your normal soap.             6.  Wash thoroughly, paying special attention to the area where your surgery  will be performed.  7.  Thoroughly rinse your body with warm water from the neck down.  8.  DO NOT shower/wash with your normal soap after using and rinsing off  the CHG Soap.                9.  Pat yourself dry with a clean towel.            10.  Wear clean pajamas.            11.  Place clean sheets on your bed the night of your first shower and do not  sleep with pets. Day of Surgery : Do not apply any lotions/deodorants the morning of surgery.  Please wear clean clothes to the hospital/surgery center.  FAILURE TO FOLLOW THESE INSTRUCTIONS MAY RESULT IN THE CANCELLATION OF YOUR SURGERY PATIENT SIGNATURE_________________________________  NURSE SIGNATURE__________________________________  ________________________________________________________________________

## 2017-10-01 NOTE — Progress Notes (Signed)
08-09-17 (Epic) CT chest and CT Abdominal Pelvis

## 2017-10-04 ENCOUNTER — Other Ambulatory Visit: Payer: Self-pay

## 2017-10-04 ENCOUNTER — Encounter (HOSPITAL_COMMUNITY)
Admission: RE | Admit: 2017-10-04 | Discharge: 2017-10-04 | Disposition: A | Discharge status: Home / Self Care | Payer: BLUE CROSS/BLUE SHIELD | Source: Ambulatory Visit | Attending: General Surgery | Admitting: General Surgery

## 2017-10-04 ENCOUNTER — Encounter (HOSPITAL_COMMUNITY): Payer: Self-pay

## 2017-10-04 DIAGNOSIS — Z885 Allergy status to narcotic agent status: Secondary | ICD-10-CM | POA: Diagnosis not present

## 2017-10-04 DIAGNOSIS — Z86010 Personal history of colonic polyps: Secondary | ICD-10-CM | POA: Diagnosis not present

## 2017-10-04 DIAGNOSIS — I10 Essential (primary) hypertension: Secondary | ICD-10-CM | POA: Diagnosis not present

## 2017-10-04 DIAGNOSIS — Z85038 Personal history of other malignant neoplasm of large intestine: Secondary | ICD-10-CM | POA: Diagnosis not present

## 2017-10-04 DIAGNOSIS — C20 Malignant neoplasm of rectum: Secondary | ICD-10-CM | POA: Diagnosis not present

## 2017-10-04 DIAGNOSIS — Z933 Colostomy status: Secondary | ICD-10-CM | POA: Diagnosis not present

## 2017-10-04 DIAGNOSIS — Z452 Encounter for adjustment and management of vascular access device: Secondary | ICD-10-CM | POA: Diagnosis not present

## 2017-10-04 LAB — CBC
HCT: 44.8 % (ref 39.0–52.0)
HEMOGLOBIN: 15.8 g/dL (ref 13.0–17.0)
MCH: 33.9 pg (ref 26.0–34.0)
MCHC: 35.3 g/dL (ref 30.0–36.0)
MCV: 96.1 fL (ref 78.0–100.0)
PLATELETS: 188 10*3/uL (ref 150–400)
RBC: 4.66 MIL/uL (ref 4.22–5.81)
RDW: 13 % (ref 11.5–15.5)
WBC: 7.6 10*3/uL (ref 4.0–10.5)

## 2017-10-04 LAB — BASIC METABOLIC PANEL
ANION GAP: 6 (ref 5–15)
BUN: 12 mg/dL (ref 6–20)
CALCIUM: 8.5 mg/dL — AB (ref 8.9–10.3)
CO2: 24 mmol/L (ref 22–32)
CREATININE: 1.02 mg/dL (ref 0.61–1.24)
Chloride: 107 mmol/L (ref 101–111)
GFR calc Af Amer: >60 mL/min (ref >60)
GLUCOSE: 78 mg/dL (ref 65–99)
Potassium: 4.2 mmol/L (ref 3.5–5.1)
Sodium: 137 mmol/L (ref 135–145)

## 2017-10-04 NOTE — Patient Instructions (Signed)
Austin Robbins  10/04/2017   Your procedure is scheduled on: 10-07-17   Report to [facility] Main  Entrance Report to Admitting at 9:30 AM   Call this number if you have problems the morning of surgery [phone]   Remember: Do not eat food or drink liquids :After Midnight.     Take these medicines the morning of surgery with A SIP OF WATER: Amlodipine (Norvasc)                                You may not have any metal on your body including hair pins and              piercings  Do not wear jewelry, lotions, powders or deodorant             Men may shave face and neck.   Do not bring valuables to the hospital. [place].  Contacts, dentures or bridgework may not be worn into surgery.      Patients discharged the day of surgery will not be allowed to drive home.  Name and phone number of your driver: Jamesrobert Ohanesian [phone]                Please read over the following fact sheets you were given: _____________________________________________________________________             [facility] - Preparing for Surgery Before surgery, you can play an important role.  Because skin is not sterile, your skin needs to be as free of germs as possible.  You can reduce the number of germs on your skin by washing with CHG (chlorahexidine gluconate) soap before surgery.  CHG is an antiseptic cleaner which kills germs and bonds with the skin to continue killing germs even after washing. Please DO NOT use if you have an allergy to CHG or antibacterial soaps.  If your skin becomes reddened/irritated stop using the CHG and inform your nurse when you arrive at Short Stay. Do not shave (including legs and underarms) for at least 48 hours prior to the first CHG shower.  You may shave your face/neck. Please follow these instructions carefully:  1.  Shower with CHG Soap the night before surgery and the  morning of  Surgery.  2.  If you choose to wash your hair, wash your hair first as usual with your  normal  shampoo.  3.  After you shampoo, rinse your hair and body thoroughly to remove the  shampoo.                           4.  Use CHG as you would any other liquid soap.  You can apply chg directly  to the skin and wash                       Gently with a scrungie or clean washcloth.  5.  Apply the CHG Soap to your body ONLY FROM THE NECK DOWN.   Do not use on face/ open                           Wound  or open sores. Avoid contact with eyes, ears mouth and genitals (private parts).                       Wash face,  Genitals (private parts) with your normal soap.             6.  Wash thoroughly, paying special attention to the area where your surgery  will be performed.  7.  Thoroughly rinse your body with warm water from the neck down.  8.  DO NOT shower/wash with your normal soap after using and rinsing off  the CHG Soap.                9.  Pat yourself dry with a clean towel.            10.  Wear clean pajamas.            11.  Place clean sheets on your bed the night of your first shower and do not  sleep with pets. Day of Surgery : Do not apply any lotions/deodorants the morning of surgery.  Please wear clean clothes to the hospital/surgery center.  FAILURE TO FOLLOW THESE INSTRUCTIONS MAY RESULT IN THE CANCELLATION OF YOUR SURGERY PATIENT SIGNATURE_________________________________  NURSE SIGNATURE__________________________________  ________________________________________________________________________

## 2017-10-05 ENCOUNTER — Other Ambulatory Visit (HOSPITAL_COMMUNITY): Payer: BLUE CROSS/BLUE SHIELD

## 2017-10-07 ENCOUNTER — Ambulatory Visit (HOSPITAL_COMMUNITY)
Admission: RE | Admit: 2017-10-07 | Discharge: 2017-10-07 | Disposition: A | Discharge status: Home / Self Care | Payer: BLUE CROSS/BLUE SHIELD | Source: Ambulatory Visit | Attending: General Surgery | Admitting: General Surgery

## 2017-10-07 ENCOUNTER — Other Ambulatory Visit: Payer: Self-pay

## 2017-10-07 ENCOUNTER — Ambulatory Visit (HOSPITAL_COMMUNITY): Payer: BLUE CROSS/BLUE SHIELD | Admitting: Anesthesiology

## 2017-10-07 ENCOUNTER — Encounter (HOSPITAL_COMMUNITY): Payer: Self-pay | Admitting: *Deleted

## 2017-10-07 ENCOUNTER — Encounter (HOSPITAL_COMMUNITY)
Admission: RE | Disposition: A | Discharge status: Home / Self Care | Payer: Self-pay | Source: Ambulatory Visit | Attending: General Surgery

## 2017-10-07 DIAGNOSIS — Z452 Encounter for adjustment and management of vascular access device: Secondary | ICD-10-CM | POA: Insufficient documentation

## 2017-10-07 DIAGNOSIS — Z885 Allergy status to narcotic agent status: Secondary | ICD-10-CM | POA: Insufficient documentation

## 2017-10-07 DIAGNOSIS — Z933 Colostomy status: Secondary | ICD-10-CM | POA: Diagnosis not present

## 2017-10-07 DIAGNOSIS — Z86010 Personal history of colonic polyps: Secondary | ICD-10-CM | POA: Diagnosis not present

## 2017-10-07 DIAGNOSIS — I10 Essential (primary) hypertension: Secondary | ICD-10-CM | POA: Insufficient documentation

## 2017-10-07 DIAGNOSIS — F419 Anxiety disorder, unspecified: Secondary | ICD-10-CM | POA: Diagnosis not present

## 2017-10-07 DIAGNOSIS — C20 Malignant neoplasm of rectum: Secondary | ICD-10-CM | POA: Diagnosis not present

## 2017-10-07 DIAGNOSIS — Z85038 Personal history of other malignant neoplasm of large intestine: Secondary | ICD-10-CM | POA: Diagnosis not present

## 2017-10-07 HISTORY — PX: PORT-A-CATH REMOVAL: SHX5289

## 2017-10-07 SURGERY — REMOVAL PORT-A-CATH
Anesthesia: Monitor Anesthesia Care

## 2017-10-07 MED ORDER — ONDANSETRON HCL 4 MG/2ML IJ SOLN
INTRAMUSCULAR | Status: DC | PRN
  Administered 2017-10-07: 4 mg via INTRAVENOUS

## 2017-10-07 MED ORDER — FENTANYL CITRATE (PF) 100 MCG/2ML IJ SOLN
INTRAMUSCULAR | Status: AC
  Filled 2017-10-07: qty 2

## 2017-10-07 MED ORDER — MIDAZOLAM HCL 5 MG/5ML IJ SOLN
INTRAMUSCULAR | Status: DC | PRN
  Administered 2017-10-07: 2 mg via INTRAVENOUS

## 2017-10-07 MED ORDER — PROPOFOL 10 MG/ML IV BOLUS
INTRAVENOUS | Status: AC
  Filled 2017-10-07: qty 40

## 2017-10-07 MED ORDER — CEFAZOLIN SODIUM-DEXTROSE 2-4 GM/100ML-% IV SOLN
2.0000 g | INTRAVENOUS | Status: AC
  Administered 2017-10-07: 2 g via INTRAVENOUS
  Filled 2017-10-07: qty 100

## 2017-10-07 MED ORDER — 0.9 % SODIUM CHLORIDE (POUR BTL) OPTIME
TOPICAL | Status: DC | PRN
  Administered 2017-10-07: 1000 mL

## 2017-10-07 MED ORDER — BUPIVACAINE-EPINEPHRINE 0.25% -1:200000 IJ SOLN
INTRAMUSCULAR | Status: AC
  Filled 2017-10-07: qty 1

## 2017-10-07 MED ORDER — ACETAMINOPHEN 500 MG PO TABS
1000.0000 mg | ORAL_TABLET | ORAL | Status: AC
  Administered 2017-10-07: 1000 mg via ORAL
  Filled 2017-10-07: qty 2

## 2017-10-07 MED ORDER — LACTATED RINGERS IV SOLN
INTRAVENOUS | Status: DC
  Administered 2017-10-07 (×2): via INTRAVENOUS

## 2017-10-07 MED ORDER — LIDOCAINE 2% (20 MG/ML) 5 ML SYRINGE
INTRAMUSCULAR | Status: DC | PRN
  Administered 2017-10-07: 100 mg via INTRAVENOUS

## 2017-10-07 MED ORDER — LIDOCAINE 2% (20 MG/ML) 5 ML SYRINGE
INTRAMUSCULAR | Status: AC
  Filled 2017-10-07: qty 5

## 2017-10-07 MED ORDER — BUPIVACAINE-EPINEPHRINE 0.25% -1:200000 IJ SOLN
INTRAMUSCULAR | Status: DC | PRN
  Administered 2017-10-07: 20 mL

## 2017-10-07 MED ORDER — PROPOFOL 500 MG/50ML IV EMUL
INTRAVENOUS | Status: DC | PRN
  Administered 2017-10-07: 65 ug/kg/min via INTRAVENOUS

## 2017-10-07 MED ORDER — FENTANYL CITRATE (PF) 100 MCG/2ML IJ SOLN
INTRAMUSCULAR | Status: DC | PRN
  Administered 2017-10-07: 50 ug via INTRAVENOUS

## 2017-10-07 MED ORDER — MIDAZOLAM HCL 2 MG/2ML IJ SOLN
INTRAMUSCULAR | Status: AC
  Filled 2017-10-07: qty 2

## 2017-10-07 SURGICAL SUPPLY — 26 items
BLADE SURG 15 STRL LF DISP TIS (BLADE) ×1 IMPLANT
BLADE SURG 15 STRL SS (BLADE) ×2
CHLORAPREP W/TINT 26ML (MISCELLANEOUS) ×3 IMPLANT
DECANTER SPIKE VIAL GLASS SM (MISCELLANEOUS) ×3 IMPLANT
DERMABOND ADVANCED (GAUZE/BANDAGES/DRESSINGS) ×4
DERMABOND ADVANCED .7 DNX12 (GAUZE/BANDAGES/DRESSINGS) ×2 IMPLANT
DRAPE LAPAROTOMY TRNSV 102X78 (DRAPE) ×3 IMPLANT
ELECT PENCIL ROCKER SW 15FT (MISCELLANEOUS) ×3 IMPLANT
ELECT REM PT RETURN 15FT ADLT (MISCELLANEOUS) ×3 IMPLANT
GAUZE SPONGE 4X4 12PLY STRL (GAUZE/BANDAGES/DRESSINGS) IMPLANT
GAUZE SPONGE 4X4 16PLY XRAY LF (GAUZE/BANDAGES/DRESSINGS) ×3 IMPLANT
GLOVE BIO SURGEON STRL SZ 6.5 (GLOVE) ×2 IMPLANT
GLOVE BIO SURGEONS STRL SZ 6.5 (GLOVE) ×1
GLOVE BIOGEL PI IND STRL 7.0 (GLOVE) ×1 IMPLANT
GLOVE BIOGEL PI INDICATOR 7.0 (GLOVE) ×2
GOWN STRL REUS W/TWL 2XL LVL3 (GOWN DISPOSABLE) ×3 IMPLANT
GOWN STRL REUS W/TWL XL LVL3 (GOWN DISPOSABLE) ×3 IMPLANT
KIT BASIN OR (CUSTOM PROCEDURE TRAY) ×3 IMPLANT
NEEDLE HYPO 25X1 1.5 SAFETY (NEEDLE) ×3 IMPLANT
PACK BASIC VI WITH GOWN DISP (CUSTOM PROCEDURE TRAY) ×3 IMPLANT
SUT VIC AB 3-0 SH 18 (SUTURE) ×3 IMPLANT
SUT VIC AB 4-0 PS2 18 (SUTURE) ×3 IMPLANT
SYR CONTROL 10ML LL (SYRINGE) ×3 IMPLANT
TOWEL OR 17X26 10 PK STRL BLUE (TOWEL DISPOSABLE) ×3 IMPLANT
TOWEL OR NON WOVEN STRL DISP B (DISPOSABLE) ×3 IMPLANT
YANKAUER SUCT BULB TIP 10FT TU (MISCELLANEOUS) IMPLANT

## 2017-10-07 NOTE — Interval H&P Note (Signed)
History and Physical Interval Note:  10/07/2017 10:54 AM  Austin Robbins  has presented today for surgery, with the diagnosis of RECTAL CANCER  The various methods of treatment have been discussed with the patient and family. After consideration of risks, benefits and other options for treatment, the patient has consented to  Procedure(s): REMOVAL PORT-A-CATH (N/A) as a surgical intervention .  The patient's history has been reviewed, patient examined, no change in status, stable for surgery.  I have reviewed the patient's chart and labs.  Questions were answered to the patient's satisfaction.     Rosario Adie, MD  [facility]

## 2017-10-07 NOTE — Anesthesia Postprocedure Evaluation (Signed)
Anesthesia Post Note  Patient: Austin Robbins  Procedure(s) Performed: REMOVAL PORT-A-CATH (N/A )     Patient location during evaluation: PACU Anesthesia Type: MAC Level of consciousness: awake and alert Pain management: pain level controlled Vital Signs Assessment: post-procedure vital signs reviewed and stable Respiratory status: spontaneous breathing, nonlabored ventilation, respiratory function stable and patient connected to nasal cannula oxygen Cardiovascular status: stable and blood pressure returned to baseline Postop Assessment: no apparent nausea or vomiting Anesthetic complications: no    Last Vitals:  Vitals:   10/07/17 1247 10/07/17 1319  BP: (!) 113/95 (!) 157/98  Pulse: (!) 57 (!) 53  Resp: 14 16  Temp: 36.4 C (!) 36.3 C  SpO2: 100% 99%    Last Pain:  Vitals:   10/07/17 0917  TempSrc: Oral                 Barnet Glasgow

## 2017-10-07 NOTE — Anesthesia Preprocedure Evaluation (Addendum)
Anesthesia Evaluation  Patient identified by MRN, date of birth, ID band Patient awake    Reviewed: Allergy & Precautions, NPO status , Patient's Chart, lab work & pertinent test results  Airway Mallampati: II  TM Distance: >3 FB Neck ROM: Full    Dental no notable dental hx.    Pulmonary neg pulmonary ROS,    Pulmonary exam normal breath sounds clear to auscultation       Cardiovascular hypertension, negative cardio ROS Normal cardiovascular exam Rhythm:Regular Rate:Normal     Neuro/Psych Anxiety negative neurological ROS  negative psych ROS   GI/Hepatic negative GI ROS, Neg liver ROS, Colon ca hx   Endo/Other  negative endocrine ROS  Renal/GU negative Renal ROS  negative genitourinary   Musculoskeletal negative musculoskeletal ROS (+)   Abdominal   Peds  Hematology negative hematology ROS (+) anemia ,   Anesthesia Other Findings   Reproductive/Obstetrics                            Anesthesia Physical Anesthesia Plan  ASA: III  Anesthesia Plan: MAC   Post-op Pain Management:    Induction: Intravenous  PONV Risk Score and Plan:   Airway Management Planned: Mask, Natural Airway and Nasal Cannula  Additional Equipment:   Intra-op Plan:   Post-operative Plan:   Informed Consent: I have reviewed the patients History and Physical, chart, labs and discussed the procedure including the risks, benefits and alternatives for the proposed anesthesia with the patient or authorized representative who has indicated his/her understanding and acceptance.   Dental advisory given  Plan Discussed with: CRNA  Anesthesia Plan Comments:         Anesthesia Quick Evaluation

## 2017-10-07 NOTE — Discharge Instructions (Signed)
GENERAL SURGERY: POST OP INSTRUCTIONS  1. DIET: Follow a light bland diet the first 24 hours after arrival home, such as soup, liquids, crackers, etc.  Be sure to include lots of fluids daily.  Avoid fast food or heavy meals as your are more likely to get nauseated.   2. Take your usually prescribed home medications unless otherwise directed. 3. PAIN CONTROL: a. Pain is best controlled by a usual combination of three different methods TOGETHER: i. Ice/Heat ii. Over the counter pain medication b. Most patients will experience some swelling and bruising around the incisions.  Ice packs or heating pads (30-60 minutes up to 6 times a day) will help. Use ice for the first few days to help decrease swelling and bruising, then switch to heat to help relax tight/sore spots and speed recovery.  Some people prefer to use ice alone, heat alone, alternating between ice & heat.  Experiment to what works for you.  Swelling and bruising can take several weeks to resolve.   c. It is helpful to take an over-the-counter pain medication regularly for the first few weeks.  Choose one of the following that works best for you: i. Naproxen (Aleve, etc)  Two 220mg  tabs twice a day ii. Ibuprofen (Advil, etc) Three 200mg  tabs four times a day (every meal & bedtime) iii. Tylenol 650mg  four times a day  4. Avoid getting constipated.  Between the surgery and the pain medications, it is common to experience some constipation.  Increasing fluid intake and taking a fiber supplement (such as Metamucil, Citrucel, FiberCon, MiraLax, etc) 1-2 times a day regularly will usually help prevent this problem from occurring.  A mild laxative (prune juice, Milk of Magnesia, MiraLax, etc) should be taken according to package directions if there are no bowel movements after 48 hours.   5. Wash / shower every day.  You may shower over the dressings as they are waterproof.  Continue to shower over incision(s) after the dressing is off. 6. Your  skin glue will wear off within 7-10 days.  If the edges begin to peal up, just trim off with scissors.= 7. ACTIVITIES as tolerated:   a. You may resume regular (light) daily activities beginning the next day--such as daily self-care, walking, climbing stairs--gradually increasing activities as tolerated.  If you can walk 30 minutes without difficulty, it is safe to try more intense activity such as jogging, treadmill, bicycling, low-impact aerobics, swimming, etc. b. Save the most intensive and strenuous activity for last such as sit-ups, heavy lifting, contact sports, etc  Refrain from any heavy lifting or straining for 48hrs after surgery.   c. DO NOT PUSH THROUGH PAIN.  Let pain be your guide: If it hurts to do something, don't do it.  Pain is your body warning you to avoid that activity for another week until the pain goes down. d. You may drive when you are no longer taking prescription pain medication, you can comfortably wear a seatbelt, and you can safely maneuver your car and apply brakes. e. Dennis Bast may have sexual intercourse when it is comfortable.  8. FOLLOW UP in our office a. Please call [facility] at [phone] to set up an appointment to see your surgeon in the office for a follow-up appointment approximately 2-3 weeks after your surgery. b. Make sure that you call for this appointment the day you arrive home to insure a convenient appointment time. 9. IF YOU HAVE DISABILITY OR FAMILY LEAVE FORMS, BRING THEM TO THE OFFICE FOR  PROCESSING.  DO NOT GIVE THEM TO YOUR DOCTOR.   WHEN TO CALL us [phone]: 1. Poor pain control 2. Reactions / problems with new medications (rash/itching, nausea, etc)  3. Fever over 101.5 F (38.5 C) 4. Worsening swelling or bruising 5. Continued bleeding from incision. 6. Increased pain, redness, or drainage from the incision   The clinic staff is available to answer your questions during regular business hours (8:30am-5pm).  Please dont hesitate to call  and ask to speak to one of our nurses for clinical concerns.   If you have a medical emergency, go to the nearest emergency room or call 911.  A surgeon from [facility] is always on call at the [facility], [place], [place], [place], [place]  [address] ? MAIN: [phone] ? TOLL FREE: [phone] ?  FAX [phone] [email or web address]    Ok to return to work in 5 days.

## 2017-10-07 NOTE — Op Note (Signed)
10/07/2017  12:18 PM  PATIENT:  Austin Robbins  62 y.o. male  Patient Care Team: Timmothy Euler, MD as PCP - General (Family Medicine) Alexis Frock, MD as Consulting Physician (Urology) Leighton Ruff, MD as Consulting Physician (General Surgery) Gatha Mayer, MD as Consulting Physician (Gastroenterology) Carola Frost, RN as Registered Nurse  PRE-OPERATIVE DIAGNOSIS:  RECTAL CANCER, PORT IN PLACE  POST-OPERATIVE DIAGNOSIS:  RECTAL CANCER, PORT IN PLACE  PROCEDURE:   REMOVAL PORT-A-CATH   Surgeon(s): Leighton Ruff, MD  ASSISTANT: none   ANESTHESIA:   local and MAC  EBL:  Total I/O In: 600 [I.V.:600] Out: -   DRAINS: none   SPECIMEN:  Source of Specimen:  PORT  DISPOSITION OF SPECIMEN:  PATHOLOGY  COUNTS:  YES  PLAN OF CARE: Discharge to home after PACU  PATIENT DISPOSITION:  PACU - hemodynamically stable.  INDICATION: 62 y.o. male who presents to the office after completed IV chemotherapy for stage IV rectal cancer.  The patient and his oncologist have no plans to do any further IV chemotherapy and would like the port removed.   OR FINDINGS: Intact port left subclavian vein  DESCRIPTION: the patient was identified in the preoperative holding area and taken to the OR where they were laid supine on the operating room table.  MAC anesthesia was induced without difficulty. SCDs were also noted to be in place prior to the initiation of anesthesia.  The patient was then prepped and draped in the usual sterile fashion.   A surgical timeout was performed indicating the correct patient, procedure, positioning and need for preoperative antibiotics.   I began by placing a field block with Marcaine with epinephrine.  Once this was completed, an incision was made through the patient's previous scar using a 15 blade scalpel.  Dissection was carried down through subcutaneous tissues using electrocautery.  The port was dissected free from the subcutaneous capsule.  2  Prolene sutures were cut and removed from the wound.  The port was then delivered out of the wound and a 3-0 Vicryl pursestring suture was placed upon the entrance of the port catheter.  The port catheter was then completely removed and the pursestring was tied tightly around this to prevent backflow.  The capsule was then reapproximated using interrupted 3-0 Vicryl sutures.  The skin was closed with a running 4-0 Vicryl subcuticular suture and Dermabond.  The patient tolerated this well and was awakened from anesthesia and sent to the postanesthesia care unit in stable condition.  All counts were correct per operating room staff.

## 2017-10-07 NOTE — Transfer of Care (Signed)
Immediate Anesthesia Transfer of Care Note  Patient: Austin Robbins  Procedure(s) Performed: REMOVAL PORT-A-CATH (N/A )  Patient Location: PACU  Anesthesia Type:MAC  Level of Consciousness: awake, alert  and oriented  Airway & Oxygen Therapy: Patient Spontanous Breathing and Patient connected to face mask oxygen  Post-op Assessment: Report given to RN  Post vital signs: Reviewed and stable  Last Vitals:  Vitals:   10/07/17 0917  BP: (!) 145/91  Pulse: (!) 58  Resp: 16  Temp: 36.8 C  SpO2: 97%    Last Pain:  Vitals:   10/07/17 0917  TempSrc: Oral      Patients Stated Pain Goal: 4 (32/95/18 8416)  Complications: No apparent anesthesia complications

## 2017-10-08 ENCOUNTER — Encounter (HOSPITAL_COMMUNITY): Payer: Self-pay | Admitting: General Surgery

## 2017-11-03 DIAGNOSIS — Z433 Encounter for attention to colostomy: Secondary | ICD-10-CM | POA: Diagnosis not present

## 2017-11-03 DIAGNOSIS — Z936 Other artificial openings of urinary tract status: Secondary | ICD-10-CM | POA: Diagnosis not present

## 2017-11-03 DIAGNOSIS — Z933 Colostomy status: Secondary | ICD-10-CM | POA: Diagnosis not present

## 2017-11-03 DIAGNOSIS — Z436 Encounter for attention to other artificial openings of urinary tract: Secondary | ICD-10-CM | POA: Diagnosis not present

## 2017-11-05 ENCOUNTER — Inpatient Hospital Stay: Payer: BLUE CROSS/BLUE SHIELD

## 2017-11-05 ENCOUNTER — Inpatient Hospital Stay: Payer: BLUE CROSS/BLUE SHIELD | Attending: Oncology | Admitting: Oncology

## 2017-11-05 ENCOUNTER — Telehealth: Payer: Self-pay

## 2017-11-05 DIAGNOSIS — I10 Essential (primary) hypertension: Secondary | ICD-10-CM | POA: Diagnosis not present

## 2017-11-05 DIAGNOSIS — C20 Malignant neoplasm of rectum: Secondary | ICD-10-CM | POA: Insufficient documentation

## 2017-11-05 DIAGNOSIS — C7972 Secondary malignant neoplasm of left adrenal gland: Secondary | ICD-10-CM

## 2017-11-05 DIAGNOSIS — D509 Iron deficiency anemia, unspecified: Secondary | ICD-10-CM | POA: Diagnosis not present

## 2017-11-05 LAB — COMPREHENSIVE METABOLIC PANEL
ALBUMIN: 3.7 g/dL (ref 3.5–5.0)
ALT: 16 U/L (ref 0–55)
AST: 16 U/L (ref 5–34)
Alkaline Phosphatase: 80 U/L (ref 40–150)
Anion gap: 10 (ref 3–11)
BUN: 12 mg/dL (ref 7–26)
CHLORIDE: 106 mmol/L (ref 98–109)
CO2: 21 mmol/L — AB (ref 22–29)
CREATININE: 0.92 mg/dL (ref 0.70–1.30)
Calcium: 9.2 mg/dL (ref 8.4–10.4)
GFR calc Af Amer: >60 mL/min (ref >60)
GFR calc non Af Amer: >60 mL/min (ref >60)
GLUCOSE: 129 mg/dL (ref 70–140)
Potassium: 4.3 mmol/L (ref 3.5–5.1)
SODIUM: 137 mmol/L (ref 136–145)
Total Bilirubin: 0.6 mg/dL (ref 0.2–1.2)
Total Protein: 7.1 g/dL (ref 6.4–8.3)

## 2017-11-05 LAB — CBC WITH DIFFERENTIAL/PLATELET
Basophils Absolute: 0 10*3/uL (ref 0.0–0.1)
Basophils Relative: 0 %
EOS ABS: 0.3 10*3/uL (ref 0.0–0.5)
Eosinophils Relative: 4 %
HCT: 45.6 % (ref 38.4–49.9)
Hemoglobin: 16.1 g/dL (ref 13.0–17.1)
LYMPHS ABS: 2.3 10*3/uL (ref 0.9–3.3)
Lymphocytes Relative: 31 %
MCH: 33.3 pg (ref 27.2–33.4)
MCHC: 35.3 g/dL (ref 32.0–36.0)
MCV: 94.4 fL (ref 79.3–98.0)
MONO ABS: 0.5 10*3/uL (ref 0.1–0.9)
MONOS PCT: 7 %
NEUTROS PCT: 58 %
Neutro Abs: 4.2 10*3/uL (ref 1.5–6.5)
Platelets: 170 10*3/uL (ref 140–400)
RBC: 4.83 MIL/uL (ref 4.20–5.82)
RDW: 12.8 % (ref 11.0–14.6)
WBC: 7.2 10*3/uL (ref 4.0–10.3)

## 2017-11-05 MED ORDER — ALPRAZOLAM 1 MG PO TABS: ORAL_TABLET | ORAL | 1 refills | Status: DC

## 2017-11-05 NOTE — Progress Notes (Signed)
[place] OFFICE PROGRESS NOTE   Diagnosis: Rectal cancer  INTERVAL HISTORY:   Austin Robbins returns as scheduled.  He continues to have neuropathy symptoms in the feet.  Viagra did not help erectile dysfunction.  No other complaint. Dr. Marcello Moores remove the Port-A-Cath 10/07/2017  Objective:  Vital signs in last 24 hours:  Blood pressure (!) 148/91, pulse 63, temperature 98.4 F (36.9 C), temperature source Oral, resp. rate 18, height _0  (1.854 m), weight 233 lb 14.4 oz (106.1 kg), SpO2 97 %.    HEENT: Neck without mass Lymphatics: No cervical, supraclavicular, axillary, or inguinal nodes Resp: Lungs clear bilaterally Cardio: Regular rate and rhythm GI: No hepatomegaly, no mass, nontender, right lower quadrant urostomy, left lower quadrant colostomy Vascular: No leg edema   Lab Results:  Lab Results  Component Value Date   WBC 7.2 11/05/2017   HGB 16.1 11/05/2017   HCT 45.6 11/05/2017   MCV 94.4 11/05/2017   PLT 170 11/05/2017   NEUTROABS 4.2 11/05/2017    CMP     Component Value Date/Time   NA 137 11/05/2017 0751   NA 139 08/09/2017 1027   K 4.3 11/05/2017 0751   K 3.8 08/09/2017 1027   CL 106 11/05/2017 0751   CO2 21 (L) 11/05/2017 0751   CO2 21 (L) 08/09/2017 1027   GLUCOSE 129 11/05/2017 0751   GLUCOSE 97 08/09/2017 1027   BUN 12 11/05/2017 0751   BUN 11.5 08/09/2017 1027   CREATININE 0.92 11/05/2017 0751   CREATININE 0.8 08/09/2017 1027   CALCIUM 9.2 11/05/2017 0751   CALCIUM 8.9 08/09/2017 1027   PROT 7.1 11/05/2017 0751   PROT 6.7 08/09/2017 1027   ALBUMIN 3.7 11/05/2017 0751   ALBUMIN 3.6 08/09/2017 1027   AST 16 11/05/2017 0751   AST 13 08/09/2017 1027   ALT 16 11/05/2017 0751   ALT 12 08/09/2017 1027   ALKPHOS 80 11/05/2017 0751   ALKPHOS 72 08/09/2017 1027   BILITOT 0.6 11/05/2017 0751   BILITOT 0.42 08/09/2017 1027   GFRNONAA >60 11/05/2017 0751   GFRAA >60 11/05/2017 0751    Lab Results  Component Value Date   CEA1 2.83 08/09/2017     Medications: I have reviewed the patient's current medications.   Assessment/Plan: 1. Clinical stage IV (A2N,K5L,Z7) adenocarcinoma of the rectum with tumor directly extending to the bladder/prostate and CT scan evidence of metastatic chest adenopathy  Positive K-ras mutation.   Normal mismatch repair protein expression. Microsatellite stable   Status post low anterior resection and end colostomy with a cystoprostatectomy and colon conduit urinary diversion 11/01/2013.   Staging PET scan with hypermetabolic pelvic nodes, mediastinal nodes, left adrenal metastasis, and left upper lobe nodule.   Initiation of FOLFOX 12/25/2013.   Restaging CT 03/16/2014 after 6 cycles of FOLFOX confirmed improvement in chest/pelvic lymphadenopathy, a left upper lobe nodule, and resolution of a left adrenal nodule   Oxaliplatin deleted from cycle 8 FOLFOX 04/02/2014 secondary to neuropathy and thrombocytopenia.   Oxaliplatin held with cycle 9 FOLFOX 04/16/2014 due to neuropathy.   Cycle 10 FOLFOX 04/30/2014.   Cycle 11 FOLFOX 05/15/2014.   Cycle 12 FOLFOX 05/28/2014. Oxaliplatin held due to increased neuropathy.   Restaging CT evaluation 06/08/2014 with stable mediastinal and hilar adenopathy. Stable borderline left iliac lymph node. No new or progressive disease identified within the chest, abdomen or pelvis.   "Maintenance" 5-fluorouracil beginning 06/11/2014.   Restaging CT evaluation 10/10/2014 showed similar mild mediastinal and right hilar adenopathy. No visible  recurrence of the prior left upper lobe metastatic lesion or the left adrenal metastatic lesion. No new lesions noted.   Maintenance Xeloda on a 7 day on/7 day off schedule beginning 10/25/2014   Restaging CTs 03/04/2015 with stable mediastinal lymph nodes and no evidence of metastatic disease   Xeloda continued   Xeloda dose reduced beginning 04/15/2015 due to progressive hand-foot  syndrome.   Xeloda placed on hold 05/15/2015 due to continued hand/foot pain.   Xeloda resumed 06/10/2015.   Restaging CTs 08/09/2015 with no evidence of disease progression   Continuation of maintenance Xeloda  CTs 08/03/2016-no evidence of metastatic disease, no pathologic chest lymphadenopathy  Continuation of maintenance Xeloda  CTs 08/09/2017-no evidence for metastatic disease in the chest, abdomen or pelvis.  No evidence for local recurrence in the pelvis.  Xeloda discontinued 2. Microcytic anemia-likely iron deficiency, improved.  3. Gout. 4. Port-A-Cath placement 12/18/2013.  5. Anxiety. He takes Xanax as needed. 6. History of left knee pain and swelling. 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 Trial of Cymbalta initiated 07/09/2014. Trial of amitriptyline initiated 12/11/2015. 8. History of Thrombocytopenia secondary to chemotherapy. 9. Skin rash 04/30/2014-resolved with doxycycline 10. Chronic edema left lower leg/ankle. Negative Doppler 09/17/2014 11. Hand-foot syndrome secondary to Xeloda. Improved. 12. Enlargement of the left testicle 08/13/2015. He has been evaluated by urology. 13. Hypertension.Norvasc prescribed 05/13/2016. Resumed 10/28/2016. Reports not taking 12/02/2016 due to normal blood pressure outside of the office.Reports taking consistently 02/24/2017. 14. Right leg edema 04/07/2017. Doppler negative for DVT.    Disposition: Austin Robbins is in clinical remission from rectal cancer.  He will return for an office visit in 3 months.  He will contact us in the interim as needed.  15 minutes were spent with the patient today.  The majority of the time was used for counseling and coordination of care.  [name], MD  11/05/2017  3:36 PM

## 2017-11-05 NOTE — Telephone Encounter (Signed)
Printed avs and calender of upcoming. Per 3/1 los

## 2017-11-08 LAB — CEA (IN HOUSE-CHCC): CEA (CHCC-IN HOUSE): 2.46 ng/mL (ref 0.00–5.00)

## 2017-12-30 ENCOUNTER — Other Ambulatory Visit: Payer: Self-pay | Admitting: *Deleted

## 2017-12-30 DIAGNOSIS — C20 Malignant neoplasm of rectum: Secondary | ICD-10-CM

## 2017-12-30 MED ORDER — INDOMETHACIN 50 MG PO CAPS: 50.0000 mg | ORAL_CAPSULE | Freq: Three times a day (TID) | ORAL | 1 refills | Status: DC | PRN

## 2018-01-07 DIAGNOSIS — Z436 Encounter for attention to other artificial openings of urinary tract: Secondary | ICD-10-CM | POA: Diagnosis not present

## 2018-01-07 DIAGNOSIS — Z936 Other artificial openings of urinary tract status: Secondary | ICD-10-CM | POA: Diagnosis not present

## 2018-01-07 DIAGNOSIS — Z433 Encounter for attention to colostomy: Secondary | ICD-10-CM | POA: Diagnosis not present

## 2018-01-07 DIAGNOSIS — Z933 Colostomy status: Secondary | ICD-10-CM | POA: Diagnosis not present

## 2018-02-04 ENCOUNTER — Telehealth: Payer: Self-pay | Admitting: Oncology

## 2018-02-04 ENCOUNTER — Encounter: Payer: Self-pay | Admitting: Nurse Practitioner

## 2018-02-04 ENCOUNTER — Inpatient Hospital Stay: Payer: BLUE CROSS/BLUE SHIELD | Attending: Oncology | Admitting: Nurse Practitioner

## 2018-02-04 VITALS — BP 145/97 | HR 60 | Temp 98.6°F | Resp 17 | Ht 73.0 in | Wt 234.1 lb

## 2018-02-04 DIAGNOSIS — I10 Essential (primary) hypertension: Secondary | ICD-10-CM

## 2018-02-04 DIAGNOSIS — C20 Malignant neoplasm of rectum: Secondary | ICD-10-CM | POA: Insufficient documentation

## 2018-02-04 DIAGNOSIS — D509 Iron deficiency anemia, unspecified: Secondary | ICD-10-CM | POA: Insufficient documentation

## 2018-02-04 DIAGNOSIS — Z938 Other artificial opening status: Secondary | ICD-10-CM

## 2018-02-04 DIAGNOSIS — C7972 Secondary malignant neoplasm of left adrenal gland: Secondary | ICD-10-CM | POA: Diagnosis not present

## 2018-02-04 DIAGNOSIS — Z933 Colostomy status: Secondary | ICD-10-CM | POA: Insufficient documentation

## 2018-02-04 MED ORDER — INDOMETHACIN 50 MG PO CAPS: 50.0000 mg | ORAL_CAPSULE | Freq: Three times a day (TID) | ORAL | 1 refills | Status: DC | PRN

## 2018-02-04 MED ORDER — ALPRAZOLAM 1 MG PO TABS: ORAL_TABLET | ORAL | 1 refills | Status: DC

## 2018-02-04 NOTE — Telephone Encounter (Signed)
Appointments scheduled AVS/Calendar printed per 5/31 los °

## 2018-02-04 NOTE — Progress Notes (Signed)
[name]   Diagnosis: Rectal cancer  INTERVAL HISTORY:   Austin Robbins returns as scheduled.  He overall feels well.  He has a good appetite.  Energy level varies.  He is working full-time.  He denies pain.  No nausea or vomiting.  No shortness of breath.  Urostomy and colostomy functioning normally.  Objective:  Vital signs in last 24 hours:  Blood pressure (!) 145/97, pulse 60, temperature 98.6 F (37 C), temperature source Oral, resp. rate 17, height 6' 1"  (1.854 m), weight 234 lb 1.6 oz (106.2 kg), SpO2 98 %.    HEENT: Neck without mass. Lymphatics: No palpable cervical, supraclavicular, axillary or inguinal lymph nodes. Resp: Lungs clear bilaterally. Cardio: Regular rate and rhythm. GI: Abdomen soft and nontender.  No hepatomegaly.  No mass.  Right lower quadrant urostomy.  Left lower quadrant colostomy. Vascular: No leg edema. Skin: Erythema/acne-like lesions involving the malar regions bilaterally.   Lab Results:  Lab Results  Component Value Date   WBC 7.2 11/05/2017   HGB 16.1 11/05/2017   HCT 45.6 11/05/2017   MCV 94.4 11/05/2017   PLT 170 11/05/2017   NEUTROABS 4.2 11/05/2017    Imaging:  No results found.  Medications: I have reviewed the patient's current medications.  Assessment/Plan: 1. Clinical stage IV (J6R,C7E,L3) adenocarcinoma of the rectum with tumor directly extending to the bladder/prostate and CT scan evidence of metastatic chest adenopathy  Positive K-ras mutation.   Normal mismatch repair protein expression. Microsatellite stable   Status post low anterior resection and end colostomy with a cystoprostatectomy and colon conduit urinary diversion 11/01/2013.   Staging PET scan with hypermetabolic pelvic nodes, mediastinal nodes, left adrenal metastasis, and left upper lobe nodule.   Initiation of FOLFOX 12/25/2013.   Restaging CT 03/16/2014 after 6 cycles of FOLFOX confirmed improvement in  chest/pelvic lymphadenopathy, a left upper lobe nodule, and resolution of a left adrenal nodule   Oxaliplatin deleted from cycle 8 FOLFOX 04/02/2014 secondary to neuropathy and thrombocytopenia.   Oxaliplatin held with cycle 9 FOLFOX 04/16/2014 due to neuropathy.   Cycle 10 FOLFOX 04/30/2014.   Cycle 11 FOLFOX 05/15/2014.   Cycle 12 FOLFOX 05/28/2014. Oxaliplatin held due to increased neuropathy.   Restaging CT evaluation 06/08/2014 with stable mediastinal and hilar adenopathy. Stable borderline left iliac lymph node. No new or progressive disease identified within the chest, abdomen or pelvis.   "Maintenance" 5-fluorouracil beginning 06/11/2014.   Restaging CT evaluation 10/10/2014 showed similar mild mediastinal and right hilar adenopathy. No visible recurrence of the prior left upper lobe metastatic lesion or the left adrenal metastatic lesion. No new lesions noted.   Maintenance Xeloda on a 7 day on/7 day off schedule beginning 10/25/2014   Restaging CTs 03/04/2015 with stable mediastinal lymph nodes and no evidence of metastatic disease   Xeloda continued   Xeloda dose reduced beginning 04/15/2015 due to progressive hand-foot syndrome.   Xeloda placed on hold 05/15/2015 due to continued hand/foot pain.   Xeloda resumed 06/10/2015.   Restaging CTs 08/09/2015 with no evidence of disease progression   Continuation of maintenance Xeloda  CTs 08/03/2016-no evidence of metastatic disease, no pathologic chest lymphadenopathy  Continuation of maintenance Xeloda  CTs 08/09/2017-no evidence for metastatic disease in the chest, abdomen or pelvis. No evidence for local recurrence in the pelvis.  Xeloda discontinued 2. Microcytic anemia-likely iron deficiency, improved.  3. Gout. 4. Port-A-Cath placement 12/18/2013.  5. Anxiety. He takes Xanax as needed. 6. History of left knee pain  and swelling. 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 Trial of Cymbalta initiated 07/09/2014. Trial of amitriptyline initiated 12/11/2015. 8. History of Thrombocytopenia secondary to chemotherapy. 9. Skin rash 04/30/2014-resolved with doxycycline 10. Chronic edema left lower leg/ankle. Negative Doppler 09/17/2014 11. Hand-foot syndrome secondary to Xeloda. Improved. 12. Enlargement of the left testicle 08/13/2015. He has been evaluated by urology. 13. Hypertension.Norvasc prescribed 05/13/2016. Resumed 10/28/2016. Reports not taking 12/02/2016 due to normal blood pressure outside of the office.Reports taking consistently 02/24/2017. 14. Right leg edema 04/07/2017. Doppler negative for DVT.      Disposition: Austin Robbins appears well.  There is no clinical evidence of disease progression.  He will return for a follow-up visit and CEA in 3 months.  He will contact the office in the interim with any problems.    [name] ANP/GNP-BC   02/04/2018  10:06 AM

## 2018-02-11 DIAGNOSIS — Z933 Colostomy status: Secondary | ICD-10-CM | POA: Diagnosis not present

## 2018-02-11 DIAGNOSIS — Z436 Encounter for attention to other artificial openings of urinary tract: Secondary | ICD-10-CM | POA: Diagnosis not present

## 2018-02-11 DIAGNOSIS — Z433 Encounter for attention to colostomy: Secondary | ICD-10-CM | POA: Diagnosis not present

## 2018-02-11 DIAGNOSIS — Z936 Other artificial openings of urinary tract status: Secondary | ICD-10-CM | POA: Diagnosis not present

## 2018-05-04 ENCOUNTER — Inpatient Hospital Stay (HOSPITAL_BASED_OUTPATIENT_CLINIC_OR_DEPARTMENT_OTHER): Payer: BLUE CROSS/BLUE SHIELD | Admitting: Oncology

## 2018-05-04 ENCOUNTER — Telehealth: Payer: Self-pay

## 2018-05-04 ENCOUNTER — Inpatient Hospital Stay: Payer: BLUE CROSS/BLUE SHIELD | Attending: Oncology

## 2018-05-04 ENCOUNTER — Encounter: Payer: Self-pay | Admitting: Oncology

## 2018-05-04 VITALS — BP 137/83 | HR 58 | Temp 98.2°F | Resp 17 | Ht 73.0 in | Wt 237.5 lb

## 2018-05-04 DIAGNOSIS — Z933 Colostomy status: Secondary | ICD-10-CM | POA: Diagnosis not present

## 2018-05-04 DIAGNOSIS — D509 Iron deficiency anemia, unspecified: Secondary | ICD-10-CM | POA: Insufficient documentation

## 2018-05-04 DIAGNOSIS — C20 Malignant neoplasm of rectum: Secondary | ICD-10-CM | POA: Diagnosis not present

## 2018-05-04 DIAGNOSIS — I10 Essential (primary) hypertension: Secondary | ICD-10-CM | POA: Diagnosis not present

## 2018-05-04 DIAGNOSIS — C7972 Secondary malignant neoplasm of left adrenal gland: Secondary | ICD-10-CM

## 2018-05-04 LAB — CEA (IN HOUSE-CHCC): CEA (CHCC-IN HOUSE): 2.76 ng/mL (ref 0.00–5.00)

## 2018-05-04 MED ORDER — AMLODIPINE BESYLATE 5 MG PO TABS: 5.0000 mg | ORAL_TABLET | Freq: Every day | ORAL | 1 refills | Status: DC

## 2018-05-04 MED ORDER — ALPRAZOLAM 1 MG PO TABS: ORAL_TABLET | ORAL | 1 refills | Status: DC

## 2018-05-04 NOTE — Telephone Encounter (Signed)
Printed avs and calender of upcoming appointment.,pet 17/28 los

## 2018-05-04 NOTE — Progress Notes (Signed)
[place] OFFICE PROGRESS NOTE   Diagnosis: Rectal cancer  INTERVAL HISTORY:   Austin Robbins returns as scheduled.  He feels well.  He is working.  No new complaint.  Objective:  Vital signs in last 24 hours:  Blood pressure 137/83, pulse (!) 58, temperature 98.2 F (36.8 C), temperature source Oral, resp. rate 17, height _0  (1.854 m), weight 237 lb 8 oz (107.7 kg), SpO2 98 %.    HEENT: Neck without mass Lymphatics: No cervical, supraclavicular, axillary, or inguinal nodes Resp: Lungs clear bilaterally Cardio: Regular rate and rhythm GI: No hepatomegaly, no mass, nontender, right lower quadrant urostomy, left lower quadrant colostomy Vascular: No leg edema   Lab Results:   Lab Results  Component Value Date   CEA1 2.46 11/05/2017     Medications: I have reviewed the patient's current medications.   Assessment/Plan: 1. Clinical stage IV (K2H,C6C,B7) adenocarcinoma of the rectum with tumor directly extending to the bladder/prostate and CT scan evidence of metastatic chest adenopathy  Positive K-ras mutation.   Normal mismatch repair protein expression. Microsatellite stable   Status post low anterior resection and end colostomy with a cystoprostatectomy and colon conduit urinary diversion 11/01/2013.   Staging PET scan with hypermetabolic pelvic nodes, mediastinal nodes, left adrenal metastasis, and left upper lobe nodule.   Initiation of FOLFOX 12/25/2013.   Restaging CT 03/16/2014 after 6 cycles of FOLFOX confirmed improvement in chest/pelvic lymphadenopathy, a left upper lobe nodule, and resolution of a left adrenal nodule   Oxaliplatin deleted from cycle 8 FOLFOX 04/02/2014 secondary to neuropathy and thrombocytopenia.   Oxaliplatin held with cycle 9 FOLFOX 04/16/2014 due to neuropathy.   Cycle 10 FOLFOX 04/30/2014.   Cycle 11 FOLFOX 05/15/2014.   Cycle 12 FOLFOX 05/28/2014. Oxaliplatin held due to increased neuropathy.    Restaging CT evaluation 06/08/2014 with stable mediastinal and hilar adenopathy. Stable borderline left iliac lymph node. No new or progressive disease identified within the chest, abdomen or pelvis.   "Maintenance" 5-fluorouracil beginning 06/11/2014.   Restaging CT evaluation 10/10/2014 showed similar mild mediastinal and right hilar adenopathy. No visible recurrence of the prior left upper lobe metastatic lesion or the left adrenal metastatic lesion. No new lesions noted.   Maintenance Xeloda on a 7 day on/7 day off schedule beginning 10/25/2014   Restaging CTs 03/04/2015 with stable mediastinal lymph nodes and no evidence of metastatic disease   Xeloda continued   Xeloda dose reduced beginning 04/15/2015 due to progressive hand-foot syndrome.   Xeloda placed on hold 05/15/2015 due to continued hand/foot pain.   Xeloda resumed 06/10/2015.   Restaging CTs 08/09/2015 with no evidence of disease progression   Continuation of maintenance Xeloda  CTs 08/03/2016-no evidence of metastatic disease, no pathologic chest lymphadenopathy  Continuation of maintenance Xeloda  CTs 08/09/2017-no evidence for metastatic disease in the chest, abdomen or pelvis. No evidence for local recurrence in the pelvis.  Xeloda discontinued 2. Microcytic anemia-likely iron deficiency, improved.  3. Gout. 4. Port-A-Cath placement 12/18/2013.  5. Anxiety. He takes Xanax as needed. 6. History of left knee pain and swelling. 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 Trial of Cymbalta initiated 07/09/2014. Trial of amitriptyline initiated 12/11/2015. 8. History of Thrombocytopenia secondary to chemotherapy. 9. Skin rash 04/30/2014-resolved with doxycycline 10. Chronic edema left lower leg/ankle. Negative Doppler 09/17/2014 11. Hand-foot syndrome secondary to Xeloda. Improved. 12. Enlargement of the left testicle 08/13/2015. He  has been evaluated by urology. 13.  Hypertension.Norvasc prescribed 05/13/2016. Resumed 10/28/2016. Reports not taking 12/02/2016 due to normal blood pressure outside of the office.Reports taking consistently 02/24/2017. 14. Right leg edema 04/07/2017. Doppler negative for DVT.    Disposition: [name] remains in clinical remission from rectal cancer.  He will return for an office visit and CEA in 4 months.  15 minutes were spent with the patient today.  The majority of the time was used for counseling and coordination of care.  Austin Coder, MD  05/04/2018  9:16 AM

## 2018-05-05 ENCOUNTER — Telehealth: Payer: Self-pay

## 2018-05-05 DIAGNOSIS — Z936 Other artificial openings of urinary tract status: Secondary | ICD-10-CM | POA: Diagnosis not present

## 2018-05-05 DIAGNOSIS — Z933 Colostomy status: Secondary | ICD-10-CM | POA: Diagnosis not present

## 2018-05-05 DIAGNOSIS — Z436 Encounter for attention to other artificial openings of urinary tract: Secondary | ICD-10-CM | POA: Diagnosis not present

## 2018-05-05 DIAGNOSIS — Z433 Encounter for attention to colostomy: Secondary | ICD-10-CM | POA: Diagnosis not present

## 2018-05-05 NOTE — Telephone Encounter (Addendum)
Pt voiced understanding of message below ----- Message from Ladell Pier, MD sent at 05/04/2018  1:09 PM EDT ----- Please call patient, cea is normal, f/u as scheduled

## 2018-07-05 DIAGNOSIS — Z933 Colostomy status: Secondary | ICD-10-CM | POA: Diagnosis not present

## 2018-07-05 DIAGNOSIS — Z433 Encounter for attention to colostomy: Secondary | ICD-10-CM | POA: Diagnosis not present

## 2018-07-05 DIAGNOSIS — Z936 Other artificial openings of urinary tract status: Secondary | ICD-10-CM | POA: Diagnosis not present

## 2018-07-05 DIAGNOSIS — Z436 Encounter for attention to other artificial openings of urinary tract: Secondary | ICD-10-CM | POA: Diagnosis not present

## 2018-08-01 DIAGNOSIS — Z933 Colostomy status: Secondary | ICD-10-CM | POA: Diagnosis not present

## 2018-08-01 DIAGNOSIS — Z436 Encounter for attention to other artificial openings of urinary tract: Secondary | ICD-10-CM | POA: Diagnosis not present

## 2018-08-01 DIAGNOSIS — Z936 Other artificial openings of urinary tract status: Secondary | ICD-10-CM | POA: Diagnosis not present

## 2018-08-01 DIAGNOSIS — Z433 Encounter for attention to colostomy: Secondary | ICD-10-CM | POA: Diagnosis not present

## 2018-08-29 ENCOUNTER — Telehealth: Payer: Self-pay

## 2018-08-29 ENCOUNTER — Inpatient Hospital Stay: Payer: BLUE CROSS/BLUE SHIELD | Attending: Oncology | Admitting: Oncology

## 2018-08-29 VITALS — BP 143/96 | HR 59 | Temp 98.7°F | Resp 18 | Ht 73.0 in | Wt 239.6 lb

## 2018-08-29 DIAGNOSIS — Z23 Encounter for immunization: Secondary | ICD-10-CM | POA: Diagnosis not present

## 2018-08-29 DIAGNOSIS — I10 Essential (primary) hypertension: Secondary | ICD-10-CM | POA: Diagnosis not present

## 2018-08-29 DIAGNOSIS — C7972 Secondary malignant neoplasm of left adrenal gland: Secondary | ICD-10-CM | POA: Diagnosis not present

## 2018-08-29 DIAGNOSIS — C20 Malignant neoplasm of rectum: Secondary | ICD-10-CM | POA: Diagnosis not present

## 2018-08-29 MED ORDER — INFLUENZA VAC SPLIT QUAD 0.5 ML IM SUSY
PREFILLED_SYRINGE | INTRAMUSCULAR | Status: AC
  Filled 2018-08-29: qty 0.5

## 2018-08-29 MED ORDER — INFLUENZA VAC SPLIT QUAD 0.5 ML IM SUSY
0.5000 mL | PREFILLED_SYRINGE | Freq: Once | INTRAMUSCULAR | Status: AC
  Administered 2018-08-29: 0.5 mL via INTRAMUSCULAR

## 2018-08-29 NOTE — Telephone Encounter (Signed)
Printed avs and calender of upcoming appointment. Per 12/23 los 

## 2018-08-29 NOTE — Progress Notes (Signed)
[facility] OFFICE PROGRESS NOTE   Diagnosis: Rectal cancer  INTERVAL HISTORY:   Austin Robbins is as scheduled.  He feels well.  The ostomies are functioning well.  Good appetite.  He is working.  No new complaint.  Objective:  Vital signs in last 24 hours:  Blood pressure (!) 143/96, pulse (!) 59, temperature 98.7 F (37.1 C), temperature source Oral, resp. rate 18, height 6' 1"  (1.854 m), weight 239 lb 9.6 oz (108.7 kg), SpO2 99 %.    HEENT: Neck without mass Lymphatics: No cervical, supraclavicular, axillary, or inguinal nodes Resp: Lungs clear bilaterally Cardio: Regular rate and rhythm GI: No hepatosplenomegaly, right lower quadrant urostomy, left lower quadrant colostomy, no mass Vascular: No leg edema   Lab Results:  Lab Results  Component Value Date   WBC 7.2 11/05/2017   HGB 16.1 11/05/2017   HCT 45.6 11/05/2017   MCV 94.4 11/05/2017   PLT 170 11/05/2017   NEUTROABS 4.2 11/05/2017    CMP  Lab Results  Component Value Date   NA 137 11/05/2017   K 4.3 11/05/2017   CL 106 11/05/2017   CO2 21 (L) 11/05/2017   GLUCOSE 129 11/05/2017   BUN 12 11/05/2017   CREATININE 0.92 11/05/2017   CALCIUM 9.2 11/05/2017   PROT 7.1 11/05/2017   ALBUMIN 3.7 11/05/2017   AST 16 11/05/2017   ALT 16 11/05/2017   ALKPHOS 80 11/05/2017   BILITOT 0.6 11/05/2017   GFRNONAA >60 11/05/2017   GFRAA >60 11/05/2017    Lab Results  Component Value Date   CEA1 2.76 05/04/2018     Medications: I have reviewed the patient's current medications.   Assessment/Plan:  1. Clinical stage IV (R1M,Y1R,Z7) adenocarcinoma of the rectum with tumor directly extending to the bladder/prostate and CT scan evidence of metastatic chest adenopathy  Positive K-ras mutation.   Normal mismatch repair protein expression. Microsatellite stable   Status post low anterior resection and end colostomy with a cystoprostatectomy and colon conduit urinary diversion 11/01/2013.    Staging PET scan with hypermetabolic pelvic nodes, mediastinal nodes, left adrenal metastasis, and left upper lobe nodule.   Initiation of FOLFOX 12/25/2013.   Restaging CT 03/16/2014 after 6 cycles of FOLFOX confirmed improvement in chest/pelvic lymphadenopathy, a left upper lobe nodule, and resolution of a left adrenal nodule   Oxaliplatin deleted from cycle 8 FOLFOX 04/02/2014 secondary to neuropathy and thrombocytopenia.   Oxaliplatin held with cycle 9 FOLFOX 04/16/2014 due to neuropathy.   Cycle 10 FOLFOX 04/30/2014.   Cycle 11 FOLFOX 05/15/2014.   Cycle 12 FOLFOX 05/28/2014. Oxaliplatin held due to increased neuropathy.   Restaging CT evaluation 06/08/2014 with stable mediastinal and hilar adenopathy. Stable borderline left iliac lymph node. No new or progressive disease identified within the chest, abdomen or pelvis.   "Maintenance" 5-fluorouracil beginning 06/11/2014.   Restaging CT evaluation 10/10/2014 showed similar mild mediastinal and right hilar adenopathy. No visible recurrence of the prior left upper lobe metastatic lesion or the left adrenal metastatic lesion. No new lesions noted.   Maintenance Xeloda on a 7 day on/7 day off schedule beginning 10/25/2014   Restaging CTs 03/04/2015 with stable mediastinal lymph nodes and no evidence of metastatic disease   Xeloda continued   Xeloda dose reduced beginning 04/15/2015 due to progressive hand-foot syndrome.   Xeloda placed on hold 05/15/2015 due to continued hand/foot pain.   Xeloda resumed 06/10/2015.   Restaging CTs 08/09/2015 with no evidence of disease progression   Continuation of maintenance Xeloda  CTs 08/03/2016-no evidence of metastatic disease, no pathologic chest lymphadenopathy  Continuation of maintenance Xeloda  CTs 08/09/2017-no evidence for metastatic disease in the chest, abdomen or pelvis. No evidence for local recurrence in the pelvis.  Xeloda  discontinued 2. Microcytic anemia-likely iron deficiency, improved.  3. Gout. 4. Port-A-Cath placement 12/18/2013.  5. Anxiety. He takes Xanax as needed. 6. History of left knee pain and swelling. 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 Trial of Cymbalta initiated 07/09/2014. Trial of amitriptyline initiated 12/11/2015. 8. History of Thrombocytopenia secondary to chemotherapy. 9. Skin rash 04/30/2014-resolved with doxycycline 10. Chronic edema left lower leg/ankle. Negative Doppler 09/17/2014 11. Hand-foot syndrome secondary to Xeloda. Improved. 12. Enlargement of the left testicle 08/13/2015. He has been evaluated by urology. 13. Hypertension.Norvasc prescribed 05/13/2016. Resumed 10/28/2016. Reports not taking 12/02/2016 due to normal blood pressure outside of the office.Reports taking consistently 02/24/2017. 14. Right leg edema 04/07/2017. Doppler negative for DVT.   Disposition: Austin Robbins is in clinical remission from rectal cancer.  He will return for an office visit and CEA in 4 months.  He received an influenza vaccine today.  15 minutes were spent with the patient today.  The majority of the time was used for counseling and coordination of care.  [name], MD  08/29/2018  8:33 AM

## 2018-09-06 DIAGNOSIS — Z936 Other artificial openings of urinary tract status: Secondary | ICD-10-CM | POA: Diagnosis not present

## 2018-09-06 DIAGNOSIS — Z433 Encounter for attention to colostomy: Secondary | ICD-10-CM | POA: Diagnosis not present

## 2018-09-06 DIAGNOSIS — Z436 Encounter for attention to other artificial openings of urinary tract: Secondary | ICD-10-CM | POA: Diagnosis not present

## 2018-09-06 DIAGNOSIS — Z933 Colostomy status: Secondary | ICD-10-CM | POA: Diagnosis not present

## 2018-09-15 ENCOUNTER — Other Ambulatory Visit: Payer: Self-pay | Admitting: *Deleted

## 2018-09-15 DIAGNOSIS — C20 Malignant neoplasm of rectum: Secondary | ICD-10-CM

## 2018-09-15 MED ORDER — ALPRAZOLAM 1 MG PO TABS: ORAL_TABLET | ORAL | 0 refills | Status: DC

## 2018-09-15 MED ORDER — INDOMETHACIN 50 MG PO CAPS: 50.0000 mg | ORAL_CAPSULE | Freq: Three times a day (TID) | ORAL | 1 refills | Status: DC | PRN

## 2018-09-15 MED ORDER — AMLODIPINE BESYLATE 5 MG PO TABS: 5.0000 mg | ORAL_TABLET | Freq: Every day | ORAL | 1 refills | Status: DC

## 2018-11-17 ENCOUNTER — Other Ambulatory Visit: Payer: Self-pay | Admitting: *Deleted

## 2018-11-17 DIAGNOSIS — C20 Malignant neoplasm of rectum: Secondary | ICD-10-CM

## 2018-11-17 MED ORDER — ALPRAZOLAM 1 MG PO TABS: ORAL_TABLET | ORAL | 0 refills | Status: DC

## 2018-11-24 DIAGNOSIS — Z936 Other artificial openings of urinary tract status: Secondary | ICD-10-CM | POA: Diagnosis not present

## 2018-11-24 DIAGNOSIS — Z436 Encounter for attention to other artificial openings of urinary tract: Secondary | ICD-10-CM | POA: Diagnosis not present

## 2018-11-24 DIAGNOSIS — Z433 Encounter for attention to colostomy: Secondary | ICD-10-CM | POA: Diagnosis not present

## 2018-11-24 DIAGNOSIS — Z933 Colostomy status: Secondary | ICD-10-CM | POA: Diagnosis not present

## 2018-12-20 ENCOUNTER — Telehealth: Payer: Self-pay | Admitting: *Deleted

## 2018-12-20 ENCOUNTER — Encounter: Payer: Self-pay | Admitting: *Deleted

## 2018-12-20 NOTE — Telephone Encounter (Signed)
Called and left the patient a message that we were mailing his appointment calendar out to him.

## 2018-12-26 ENCOUNTER — Ambulatory Visit: Payer: BLUE CROSS/BLUE SHIELD | Admitting: Oncology

## 2018-12-26 ENCOUNTER — Other Ambulatory Visit: Payer: BLUE CROSS/BLUE SHIELD

## 2018-12-29 DIAGNOSIS — Z436 Encounter for attention to other artificial openings of urinary tract: Secondary | ICD-10-CM | POA: Diagnosis not present

## 2018-12-29 DIAGNOSIS — Z933 Colostomy status: Secondary | ICD-10-CM | POA: Diagnosis not present

## 2018-12-29 DIAGNOSIS — Z433 Encounter for attention to colostomy: Secondary | ICD-10-CM | POA: Diagnosis not present

## 2018-12-29 DIAGNOSIS — Z936 Other artificial openings of urinary tract status: Secondary | ICD-10-CM | POA: Diagnosis not present

## 2019-01-23 ENCOUNTER — Inpatient Hospital Stay: Payer: BLUE CROSS/BLUE SHIELD

## 2019-01-23 ENCOUNTER — Other Ambulatory Visit: Payer: Self-pay

## 2019-01-23 ENCOUNTER — Telehealth: Payer: Self-pay | Admitting: Nurse Practitioner

## 2019-01-23 ENCOUNTER — Inpatient Hospital Stay: Payer: BLUE CROSS/BLUE SHIELD | Attending: Nurse Practitioner | Admitting: Nurse Practitioner

## 2019-01-23 ENCOUNTER — Encounter: Payer: Self-pay | Admitting: Nurse Practitioner

## 2019-01-23 VITALS — BP 158/88 | HR 62 | Temp 98.5°F | Resp 18 | Ht 73.0 in | Wt 242.8 lb

## 2019-01-23 DIAGNOSIS — C20 Malignant neoplasm of rectum: Secondary | ICD-10-CM | POA: Diagnosis not present

## 2019-01-23 DIAGNOSIS — I10 Essential (primary) hypertension: Secondary | ICD-10-CM | POA: Diagnosis not present

## 2019-01-23 DIAGNOSIS — C7972 Secondary malignant neoplasm of left adrenal gland: Secondary | ICD-10-CM | POA: Diagnosis not present

## 2019-01-23 LAB — CEA (IN HOUSE-CHCC): CEA (CHCC-In House): 3.28 ng/mL (ref 0.00–5.00)

## 2019-01-23 NOTE — Telephone Encounter (Signed)
Scheduled appt per 5/18 los. ° °A calendar will be mailed out. °

## 2019-01-23 NOTE — Progress Notes (Signed)
Austin Robbins OFFICE PROGRESS NOTE   Diagnosis: Rectal cancer  INTERVAL HISTORY:   Austin Robbins returns as scheduled.  He overall feels well.  He has a good appetite.  He denies abdominal pain.  No nausea or vomiting.  Ostomies are functioning normally.  He remains very busy with work.  No fever, cough, shortness of breath.  Objective:  Vital signs in last 24 hours:  Blood pressure (!) 158/88, pulse 62, temperature 98.5 F (36.9 C), temperature source Oral, resp. rate 18, height 6' 1"  (1.854 m), weight 242 lb 12.8 oz (110.1 kg), SpO2 100 %.    HEENT: No thrush or ulcers.  Neck without mass. Lymphatics: No palpable cervical, supraclavicular, axillary or inguinal lymph nodes. GI: Abdomen soft and nontender.  No hepatosplenomegaly.  Right lower quadrant urostomy.  Left lower quadrant colostomy.  No mass. Vascular: Trace edema at the lower legs bilaterally.   Lab Results:  Lab Results  Component Value Date   WBC 7.2 11/05/2017   HGB 16.1 11/05/2017   HCT 45.6 11/05/2017   MCV 94.4 11/05/2017   PLT 170 11/05/2017   NEUTROABS 4.2 11/05/2017    Imaging:  No results found.  Medications: I have reviewed the patient's current medications.  Assessment/Plan: 1. Clinical stage IV (N6E,X5M,W4) adenocarcinoma of the rectum with tumor directly extending to the bladder/prostate and CT scan evidence of metastatic chest adenopathy  Positive K-ras mutation.   Normal mismatch repair protein expression. Microsatellite stable   Status post low anterior resection and end colostomy with a cystoprostatectomy and colon conduit urinary diversion 11/01/2013.   Staging PET scan with hypermetabolic pelvic nodes, mediastinal nodes, left adrenal metastasis, and left upper lobe nodule.   Initiation of FOLFOX 12/25/2013.   Restaging CT 03/16/2014 after 6 cycles of FOLFOX confirmed improvement in chest/pelvic lymphadenopathy, a left upper lobe nodule, and resolution of a left  adrenal nodule   Oxaliplatin deleted from cycle 8 FOLFOX 04/02/2014 secondary to neuropathy and thrombocytopenia.   Oxaliplatin held with cycle 9 FOLFOX 04/16/2014 due to neuropathy.   Cycle 10 FOLFOX 04/30/2014.   Cycle 11 FOLFOX 05/15/2014.   Cycle 12 FOLFOX 05/28/2014. Oxaliplatin held due to increased neuropathy.   Restaging CT evaluation 06/08/2014 with stable mediastinal and hilar adenopathy. Stable borderline left iliac lymph node. No new or progressive disease identified within the chest, abdomen or pelvis.   "Maintenance" 5-fluorouracil beginning 06/11/2014.   Restaging CT evaluation 10/10/2014 showed similar mild mediastinal and right hilar adenopathy. No visible recurrence of the prior left upper lobe metastatic lesion or the left adrenal metastatic lesion. No new lesions noted.   Maintenance Xeloda on a 7 day on/7 day off schedule beginning 10/25/2014   Restaging CTs 03/04/2015 with stable mediastinal lymph nodes and no evidence of metastatic disease   Xeloda continued   Xeloda dose reduced beginning 04/15/2015 due to progressive hand-foot syndrome.   Xeloda placed on hold 05/15/2015 due to continued hand/foot pain.   Xeloda resumed 06/10/2015.   Restaging CTs 08/09/2015 with no evidence of disease progression   Continuation of maintenance Xeloda  CTs 08/03/2016-no evidence of metastatic disease, no pathologic chest lymphadenopathy  Continuation of maintenance Xeloda  CTs 08/09/2017-no evidence for metastatic disease in the chest, abdomen or pelvis. No evidence for local recurrence in the pelvis.  Xeloda discontinued 2. Microcytic anemia-likely iron deficiency, improved.  3. Gout. 4. Port-A-Cath placement 12/18/2013.  5. Anxiety. He takes Xanax as needed. 6. History of left knee pain and swelling. 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 Trial of Cymbalta initiated  07/09/2014. Trial of amitriptyline initiated 12/11/2015. 8. History of Thrombocytopenia secondary to chemotherapy. 9. Skin rash 04/30/2014-resolved with doxycycline 10. Chronic edema left lower leg/ankle. Negative Doppler 09/17/2014 11. Hand-foot syndrome secondary to Xeloda. Improved. 12. Enlargement of the left testicle 08/13/2015. He has been evaluated by urology. 13. Hypertension.Norvasc prescribed 05/13/2016. Resumed 10/28/2016. Reports not taking 12/02/2016 due to normal blood pressure outside of the office.Reports taking consistently 02/24/2017. 14. Right leg edema 04/07/2017. Doppler negative for DVT.  Disposition: Austin Robbins remains in clinical remission from rectal cancer.  We will follow-up on the CEA from today.  He will return for a follow-up visit and CEA in 4 months.  He will contact the office in the interim with any problems.    [name] ANP/GNP-BC   01/23/2019  10:41 AM

## 2019-01-26 ENCOUNTER — Other Ambulatory Visit: Payer: Self-pay | Admitting: *Deleted

## 2019-01-26 DIAGNOSIS — C20 Malignant neoplasm of rectum: Secondary | ICD-10-CM

## 2019-01-26 MED ORDER — AMLODIPINE BESYLATE 5 MG PO TABS: 5.0000 mg | ORAL_TABLET | Freq: Every day | ORAL | 0 refills | Status: DC

## 2019-01-26 MED ORDER — INDOMETHACIN 50 MG PO CAPS: 50.0000 mg | ORAL_CAPSULE | Freq: Three times a day (TID) | ORAL | 0 refills | Status: DC | PRN

## 2019-01-26 MED ORDER — ALPRAZOLAM 1 MG PO TABS: ORAL_TABLET | ORAL | 0 refills | Status: DC

## 2019-01-26 NOTE — Progress Notes (Signed)
Refill request received for Xanax, Norvasc and Indocin. Per Dr. Benay Spice: OK to refill, but future fills need to come from PCP

## 2019-02-09 DIAGNOSIS — Z933 Colostomy status: Secondary | ICD-10-CM | POA: Diagnosis not present

## 2019-02-09 DIAGNOSIS — Z436 Encounter for attention to other artificial openings of urinary tract: Secondary | ICD-10-CM | POA: Diagnosis not present

## 2019-02-09 DIAGNOSIS — Z936 Other artificial openings of urinary tract status: Secondary | ICD-10-CM | POA: Diagnosis not present

## 2019-02-09 DIAGNOSIS — Z433 Encounter for attention to colostomy: Secondary | ICD-10-CM | POA: Diagnosis not present

## 2019-03-27 DIAGNOSIS — Z936 Other artificial openings of urinary tract status: Secondary | ICD-10-CM | POA: Diagnosis not present

## 2019-03-27 DIAGNOSIS — Z436 Encounter for attention to other artificial openings of urinary tract: Secondary | ICD-10-CM | POA: Diagnosis not present

## 2019-03-27 DIAGNOSIS — Z933 Colostomy status: Secondary | ICD-10-CM | POA: Diagnosis not present

## 2019-03-27 DIAGNOSIS — Z433 Encounter for attention to colostomy: Secondary | ICD-10-CM | POA: Diagnosis not present

## 2019-05-09 ENCOUNTER — Telehealth: Payer: Self-pay | Admitting: Oncology

## 2019-05-09 NOTE — Telephone Encounter (Signed)
[facility] moved 9/18 appointments to 9/28. Left message. Schedule mailed.

## 2019-05-17 ENCOUNTER — Other Ambulatory Visit: Payer: Self-pay | Admitting: Oncology

## 2019-05-17 DIAGNOSIS — C20 Malignant neoplasm of rectum: Secondary | ICD-10-CM

## 2019-05-17 NOTE — Telephone Encounter (Signed)
Refill request

## 2019-05-19 ENCOUNTER — Telehealth: Payer: Self-pay | Admitting: *Deleted

## 2019-05-19 DIAGNOSIS — C20 Malignant neoplasm of rectum: Secondary | ICD-10-CM

## 2019-05-19 MED ORDER — ALPRAZOLAM 1 MG PO TABS: ORAL_TABLET | ORAL | 0 refills | Status: DC

## 2019-05-19 NOTE — Telephone Encounter (Signed)
Wife called to request Dr. Benay Spice refill Xanax. He has appointment on 10/22 with Dr. Claretta Fraise as new patient to establish with new PCP. He has not had a PCP in 5 years. Per Dr. Benay Spice: OK to refill x 1.

## 2019-05-26 ENCOUNTER — Other Ambulatory Visit: Payer: BLUE CROSS/BLUE SHIELD

## 2019-05-26 ENCOUNTER — Ambulatory Visit: Payer: BLUE CROSS/BLUE SHIELD | Admitting: Oncology

## 2019-06-05 ENCOUNTER — Inpatient Hospital Stay: Payer: BC Managed Care – PPO | Attending: Oncology

## 2019-06-05 ENCOUNTER — Telehealth: Payer: Self-pay

## 2019-06-05 ENCOUNTER — Other Ambulatory Visit: Payer: Self-pay

## 2019-06-05 ENCOUNTER — Inpatient Hospital Stay (HOSPITAL_BASED_OUTPATIENT_CLINIC_OR_DEPARTMENT_OTHER): Payer: BC Managed Care – PPO | Admitting: Oncology

## 2019-06-05 VITALS — BP 146/90 | HR 58 | Temp 99.6°F | Resp 18 | Ht 73.0 in | Wt 238.0 lb

## 2019-06-05 DIAGNOSIS — C20 Malignant neoplasm of rectum: Secondary | ICD-10-CM

## 2019-06-05 DIAGNOSIS — Z23 Encounter for immunization: Secondary | ICD-10-CM | POA: Diagnosis not present

## 2019-06-05 DIAGNOSIS — M109 Gout, unspecified: Secondary | ICD-10-CM | POA: Diagnosis not present

## 2019-06-05 DIAGNOSIS — I10 Essential (primary) hypertension: Secondary | ICD-10-CM | POA: Insufficient documentation

## 2019-06-05 DIAGNOSIS — C7972 Secondary malignant neoplasm of left adrenal gland: Secondary | ICD-10-CM | POA: Insufficient documentation

## 2019-06-05 LAB — CEA (IN HOUSE-CHCC): CEA (CHCC-In House): 2.22 ng/mL (ref 0.00–5.00)

## 2019-06-05 MED ORDER — SILDENAFIL CITRATE 50 MG PO TABS: 50.0000 mg | ORAL_TABLET | ORAL | 0 refills | Status: DC

## 2019-06-05 MED ORDER — INFLUENZA VAC SPLIT QUAD 0.5 ML IM SUSY
0.5000 mL | PREFILLED_SYRINGE | Freq: Once | INTRAMUSCULAR | Status: AC
  Administered 2019-06-05: 0.5 mL via INTRAMUSCULAR

## 2019-06-05 MED ORDER — INFLUENZA VAC SPLIT QUAD 0.5 ML IM SUSY
PREFILLED_SYRINGE | INTRAMUSCULAR | Status: AC
  Filled 2019-06-05: qty 0.5

## 2019-06-05 NOTE — Progress Notes (Signed)
[facility] OFFICE PROGRESS NOTE   Diagnosis: Rectal cancer  INTERVAL HISTORY:   Austin Robbins returns as scheduled.  He generally feels well.  Good appetite.  He is working.  He has malaise at times.  He had a recent gout flare in the left knee, improved with indomethacin.  He has noted partial erections in the morning and would like to try Viagra again.  Objective:  Vital signs in last 24 hours:  Blood pressure (!) 146/90, pulse (!) 58, temperature 99.6 F (37.6 C), temperature source Oral, resp. rate 18, height _0  (1.854 m), weight 238 lb (108 kg), SpO2 99 %.     HEENT: Neck without mass Lymphatics: No cervical, supraclavicular, axillary, or inguinal nodes GI: No hepatomegaly, no mass, nontender, left lower quadrant colostomy, right lower quadrant urostomy Vascular: No leg edema, the right lower leg is slightly larger than the left side   Lab Results:  Lab Results  Component Value Date   WBC 7.2 11/05/2017   HGB 16.1 11/05/2017   HCT 45.6 11/05/2017   MCV 94.4 11/05/2017   PLT 170 11/05/2017   NEUTROABS 4.2 11/05/2017    CMP  Lab Results  Component Value Date   NA 137 11/05/2017   K 4.3 11/05/2017   CL 106 11/05/2017   CO2 21 (L) 11/05/2017   GLUCOSE 129 11/05/2017   BUN 12 11/05/2017   CREATININE 0.92 11/05/2017   CALCIUM 9.2 11/05/2017   PROT 7.1 11/05/2017   ALBUMIN 3.7 11/05/2017   AST 16 11/05/2017   ALT 16 11/05/2017   ALKPHOS 80 11/05/2017   BILITOT 0.6 11/05/2017   GFRNONAA >60 11/05/2017   GFRAA >60 11/05/2017    Lab Results  Component Value Date   CEA1 3.28 01/23/2019    Lab Results  Component Value Date   INR 1.24 11/01/2013    Imaging:  No results found.  Medications: I have reviewed the patient's current medications.   Assessment/Plan: 1. Clinical stage IV (K0X,F8H,W2) adenocarcinoma of the rectum with tumor directly extending to the bladder/prostate and CT scan evidence of metastatic chest adenopathy   Positive K-ras mutation.   Normal mismatch repair protein expression. Microsatellite stable   Status post low anterior resection and end colostomy with a cystoprostatectomy and colon conduit urinary diversion 11/01/2013.   Staging PET scan with hypermetabolic pelvic nodes, mediastinal nodes, left adrenal metastasis, and left upper lobe nodule.   Initiation of FOLFOX 12/25/2013.   Restaging CT 03/16/2014 after 6 cycles of FOLFOX confirmed improvement in chest/pelvic lymphadenopathy, a left upper lobe nodule, and resolution of a left adrenal nodule   Oxaliplatin deleted from cycle 8 FOLFOX 04/02/2014 secondary to neuropathy and thrombocytopenia.   Oxaliplatin held with cycle 9 FOLFOX 04/16/2014 due to neuropathy.   Cycle 10 FOLFOX 04/30/2014.   Cycle 11 FOLFOX 05/15/2014.   Cycle 12 FOLFOX 05/28/2014. Oxaliplatin held due to increased neuropathy.   Restaging CT evaluation 06/08/2014 with stable mediastinal and hilar adenopathy. Stable borderline left iliac lymph node. No new or progressive disease identified within the chest, abdomen or pelvis.   "Maintenance" 5-fluorouracil beginning 06/11/2014.   Restaging CT evaluation 10/10/2014 showed similar mild mediastinal and right hilar adenopathy. No visible recurrence of the prior left upper lobe metastatic lesion or the left adrenal metastatic lesion. No new lesions noted.   Maintenance Xeloda on a 7 day on/7 day off schedule beginning 10/25/2014   Restaging CTs 03/04/2015 with stable mediastinal lymph nodes and no evidence of metastatic disease   Xeloda  continued   Xeloda dose reduced beginning 04/15/2015 due to progressive hand-foot syndrome.   Xeloda placed on hold 05/15/2015 due to continued hand/foot pain.   Xeloda resumed 06/10/2015.   Restaging CTs 08/09/2015 with no evidence of disease progression   Continuation of maintenance Xeloda  CTs 08/03/2016-no evidence of metastatic disease, no pathologic  chest lymphadenopathy  Continuation of maintenance Xeloda  CTs 08/09/2017-no evidence for metastatic disease in the chest, abdomen or pelvis. No evidence for local recurrence in the pelvis.  Xeloda discontinued 08/11/2017 2. Microcytic anemia-likely iron deficiency, improved.  3. Gout. 4. Port-A-Cath placement 12/18/2013.  5. Anxiety. He takes Xanax as needed. 6. History of left knee pain and swelling. 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 Trial of Cymbalta initiated 07/09/2014. Trial of amitriptyline initiated 12/11/2015. 8. History of Thrombocytopenia secondary to chemotherapy. 9. Skin rash 04/30/2014-resolved with doxycycline 10. Chronic edema left lower leg/ankle. Negative Doppler 09/17/2014 11. Hand-foot syndrome secondary to Xeloda. Improved. 12. Enlargement of the left testicle 08/13/2015. He has been evaluated by urology. 13. Hypertension.Norvasc prescribed 05/13/2016. Resumed 10/28/2016. Reports not taking 12/02/2016 due to normal blood pressure outside of the office.Reports taking consistently 02/24/2017. 14. Right leg edema 04/07/2017. Doppler negative for DVT.    Disposition: Austin Robbins is in remission from rectal cancer.  We will follow-up on the CEA from today.  He will return for an office visit and CEA in 4 months.  We refilled his prescription for Viagra.  I recommended he follow-up with urology if the Viagra does not help.  Austin Robbins received an influenza vaccine today.  We will consider referring him for a surveillance colonoscopy if he remains in remission at the next office visit.  Austin Coder, MD  06/05/2019  12:27 PM

## 2019-06-05 NOTE — Telephone Encounter (Signed)
TC to pt per [name] NP to let him know  CEA is normal, follow-up as scheduled. No further problems or concerns at this time.

## 2019-06-06 ENCOUNTER — Telehealth: Payer: Self-pay | Admitting: Oncology

## 2019-06-06 NOTE — Telephone Encounter (Signed)
I left a message regarding schedule  

## 2019-06-12 ENCOUNTER — Other Ambulatory Visit: Payer: Self-pay

## 2019-06-13 ENCOUNTER — Encounter (INDEPENDENT_AMBULATORY_CARE_PROVIDER_SITE_OTHER): Payer: Self-pay

## 2019-06-13 ENCOUNTER — Encounter: Payer: Self-pay | Admitting: Family Medicine

## 2019-06-13 ENCOUNTER — Ambulatory Visit (INDEPENDENT_AMBULATORY_CARE_PROVIDER_SITE_OTHER): Payer: BC Managed Care – PPO | Admitting: Family Medicine

## 2019-06-13 VITALS — BP 137/83 | HR 61 | Temp 97.7°F | Resp 20 | Ht 73.0 in | Wt 238.0 lb

## 2019-06-13 DIAGNOSIS — Z125 Encounter for screening for malignant neoplasm of prostate: Secondary | ICD-10-CM

## 2019-06-13 DIAGNOSIS — C20 Malignant neoplasm of rectum: Secondary | ICD-10-CM

## 2019-06-13 DIAGNOSIS — Z79899 Other long term (current) drug therapy: Secondary | ICD-10-CM

## 2019-06-13 DIAGNOSIS — I10 Essential (primary) hypertension: Secondary | ICD-10-CM | POA: Diagnosis not present

## 2019-06-13 MED ORDER — AMLODIPINE BESYLATE 5 MG PO TABS: 5.0000 mg | ORAL_TABLET | Freq: Every day | ORAL | 0 refills | Status: DC

## 2019-06-13 MED ORDER — INDOMETHACIN 50 MG PO CAPS: 50.0000 mg | ORAL_CAPSULE | Freq: Three times a day (TID) | ORAL | 0 refills | Status: DC | PRN

## 2019-06-13 MED ORDER — ALPRAZOLAM 1 MG PO TABS: ORAL_TABLET | ORAL | 0 refills | Status: DC

## 2019-06-13 MED ORDER — SILDENAFIL CITRATE 50 MG PO TABS: 50.0000 mg | ORAL_TABLET | ORAL | 0 refills | Status: DC

## 2019-06-13 NOTE — Progress Notes (Signed)
Subjective:  Patient ID: Austin Robbins, male    DOB: [birth year]  Age: 63 y.o. MRN: [id number]  CC: Establish Care (new )   HPI [name] presents for new patient physical. He also suffers from goout attacks monthly. He describes himself as a Research officer, trade [employer]. He takes xanax in the evening and at bedtime to allow for relief of worry and better sleep.   Depression screen PHQ 2/9 06/13/2019  Decreased Interest 0  Down, Depressed, Hopeless 0  PHQ - 2 Score 0  Some recent data might be hidden    History Matson has a past medical history of Adenocarcinoma of rectum ([place]) (10/25/2013), Allergy, Anemia, Colon cancer ([place]) (11/03/13), Gastritis, H. pylori infection (08/07/2013), Hypertension (05/13/2016), Panic attacks, Personal history of colonic polyps - adenoma (10/25/2013), and S/P colostomy ([place]).   He has a past surgical history that includes Tonsillectomy; Low anterior bowel resection (11/04/2013); Cystectomy w/ ureterosigmoidostomy (11/04/2013); Bowel resection (N/A, 11/03/2013); Application if wound vac (N/A, 11/03/2013); Cystoscopy with ureteroscopy and stent placement (Bilateral, 11/03/2013); Portacath placement (Left, 12/18/2013); and Port-a-cath removal (N/A, 10/07/2017).   His family history includes COPD in his mother; Healthy in his mother; Lung cancer in his father.He reports that he has never smoked. He has never used smokeless tobacco. He reports current alcohol use of about 1.0 standard drinks of alcohol per week. He reports that he does not use drugs.    ROS Review of Systems  Constitutional: Negative.   HENT: Negative.   Eyes: Negative for visual disturbance.  Respiratory: Negative for cough and shortness of breath.   Cardiovascular: Negative for chest pain and leg swelling.  Gastrointestinal: Negative for abdominal pain, diarrhea, nausea and vomiting.  Genitourinary: Negative for difficulty urinating.  Musculoskeletal: Negative for arthralgias and myalgias.  Skin: Negative for  rash.  Neurological: Negative for headaches.  Psychiatric/Behavioral: Positive for agitation. Negative for sleep disturbance. The patient is nervous/anxious.     Objective:  BP 137/83   Pulse 61   Temp 97.7 F (36.5 C)   Resp 20   Ht 6' 1"  (1.854 m)   Wt 238 lb (108 kg)   SpO2 97%   BMI 31.40 kg/m   BP Readings from Last 3 Encounters:  06/13/19 137/83  06/05/19 (!) 146/90  01/23/19 (!) 158/88    Wt Readings from Last 3 Encounters:  06/13/19 238 lb (108 kg)  06/05/19 238 lb (108 kg)  01/23/19 242 lb 12.8 oz (110.1 kg)     Physical Exam    Assessment & Plan:   Danuel was seen today for establish care.  Diagnoses and all orders for this visit:  Essential hypertension -     CBC with Differential/Platelet -     CMP14+EGFR -     Lipid panel  Rectal adenocarcinoma with perforation & invasion into bladder s/p LAR/cystectomy/colon conduit 11/04/2013 -     amLODipine (NORVASC) 5 MG tablet; Take 1 tablet (5 mg total) by mouth daily. Future refills per PCP -     indomethacin (INDOCIN) 50 MG capsule; Take 1 capsule (50 mg total) by mouth 3 (three) times daily as needed (for gout flare-ups). -     Discontinue: ALPRAZolam (XANAX) 1 MG tablet; TAKE 1 TABLET EVERY 8 HOURS AS NEEDED FOR ANXIETY. Future refills per PCP -     ALPRAZolam (XANAX) 1 MG tablet; TAKE 1 TABLET EVERY 8 HOURS AS NEEDED FOR ANXIETY. Future refills per PCP  Screening for prostate cancer -     PSA Total (Reflex To  Free)  Controlled substance agreement signed -     ToxASSURE Select 13 (MW), Urine  Other orders -     sildenafil (VIAGRA) 50 MG tablet; Take 1 tablet (50 mg total) by mouth as directed. Take 1 hour prior to sexual activity       I am having Isa P. Binstock maintain his amLODipine, indomethacin, sildenafil, and ALPRAZolam.  Allergies as of 06/13/2019      Reactions   Codeine Other (See Comments)   Chest pain      Medication List       Accurate as of June 13, 2019 11:59 PM. If  you have any questions, ask your nurse or doctor.        ALPRAZolam 1 MG tablet Commonly known as: XANAX TAKE 1 TABLET EVERY 8 HOURS AS NEEDED FOR ANXIETY. Future refills per PCP   amLODipine 5 MG tablet Commonly known as: Norvasc Take 1 tablet (5 mg total) by mouth daily. Future refills per PCP   indomethacin 50 MG capsule Commonly known as: INDOCIN Take 1 capsule (50 mg total) by mouth 3 (three) times daily as needed (for gout flare-ups).   sildenafil 50 MG tablet Commonly known as: Viagra Take 1 tablet (50 mg total) by mouth as directed. Take 1 hour prior to sexual activity        Follow-up: No follow-ups on file.  Claretta Fraise, M.D.

## 2019-06-14 ENCOUNTER — Other Ambulatory Visit: Payer: Self-pay | Admitting: Family Medicine

## 2019-06-14 DIAGNOSIS — Z79899 Other long term (current) drug therapy: Secondary | ICD-10-CM | POA: Diagnosis not present

## 2019-06-14 LAB — CBC WITH DIFFERENTIAL/PLATELET
Basophils Absolute: 0 10*3/uL (ref 0.0–0.2)
Basos: 0 %
EOS (ABSOLUTE): 0.2 10*3/uL (ref 0.0–0.4)
Eos: 2 %
Hematocrit: 44.9 % (ref 37.5–51.0)
Hemoglobin: 15.5 g/dL (ref 13.0–17.7)
Immature Grans (Abs): 0 10*3/uL (ref 0.0–0.1)
Immature Granulocytes: 1 %
Lymphocytes Absolute: 2.2 10*3/uL (ref 0.7–3.1)
Lymphs: 28 %
MCH: 32.1 pg (ref 26.6–33.0)
MCHC: 34.5 g/dL (ref 31.5–35.7)
MCV: 93 fL (ref 79–97)
Monocytes Absolute: 0.7 10*3/uL (ref 0.1–0.9)
Monocytes: 9 %
Neutrophils Absolute: 4.8 10*3/uL (ref 1.4–7.0)
Neutrophils: 60 %
Platelets: 198 10*3/uL (ref 150–450)
RBC: 4.83 x10E6/uL (ref 4.14–5.80)
RDW: 13.1 % (ref 11.6–15.4)
WBC: 8 10*3/uL (ref 3.4–10.8)

## 2019-06-14 LAB — CMP14+EGFR
ALT: 17 IU/L (ref 0–44)
AST: 19 IU/L (ref 0–40)
Albumin/Globulin Ratio: 1.5 (ref 1.2–2.2)
Albumin: 4.1 g/dL (ref 3.8–4.8)
Alkaline Phosphatase: 84 IU/L (ref 39–117)
BUN/Creatinine Ratio: 11 (ref 10–24)
BUN: 11 mg/dL (ref 8–27)
Bilirubin Total: 0.7 mg/dL (ref 0.0–1.2)
CO2: 23 mmol/L (ref 20–29)
Calcium: 9.2 mg/dL (ref 8.6–10.2)
Chloride: 104 mmol/L (ref 96–106)
Creatinine, Ser: 1.02 mg/dL (ref 0.76–1.27)
GFR calc Af Amer: 90 mL/min/{1.73_m2} (ref >59)
GFR calc non Af Amer: 78 mL/min/{1.73_m2} (ref >59)
Globulin, Total: 2.7 g/dL (ref 1.5–4.5)
Glucose: 86 mg/dL (ref 65–99)
Potassium: 3.9 mmol/L (ref 3.5–5.2)
Sodium: 141 mmol/L (ref 134–144)
Total Protein: 6.8 g/dL (ref 6.0–8.5)

## 2019-06-14 LAB — LIPID PANEL
Chol/HDL Ratio: 5.6 ratio — ABNORMAL HIGH (ref 0.0–5.0)
Cholesterol, Total: 195 mg/dL (ref 100–199)
HDL: 35 mg/dL — ABNORMAL LOW (ref >39)
LDL Chol Calc (NIH): 85 mg/dL (ref 0–99)
Triglycerides: 464 mg/dL — ABNORMAL HIGH (ref 0–149)
VLDL Cholesterol Cal: 75 mg/dL — ABNORMAL HIGH (ref 5–40)

## 2019-06-14 LAB — PSA TOTAL (REFLEX TO FREE): Prostate Specific Ag, Serum: <0.1 ng/mL (ref 0.0–4.0)

## 2019-06-14 MED ORDER — ATORVASTATIN CALCIUM 40 MG PO TABS: 40.0000 mg | ORAL_TABLET | Freq: Every day | ORAL | 3 refills | Status: DC

## 2019-06-18 LAB — TOXASSURE SELECT 13 (MW), URINE

## 2019-06-28 ENCOUNTER — Encounter: Payer: Self-pay | Admitting: *Deleted

## 2019-06-29 ENCOUNTER — Ambulatory Visit: Payer: 59 | Admitting: Family Medicine

## 2019-09-04 ENCOUNTER — Other Ambulatory Visit: Payer: Self-pay | Admitting: Family Medicine

## 2019-09-04 DIAGNOSIS — C20 Malignant neoplasm of rectum: Secondary | ICD-10-CM

## 2019-09-08 HISTORY — PX: COLONOSCOPY: SHX174

## 2019-10-05 ENCOUNTER — Encounter: Payer: Self-pay | Admitting: Nurse Practitioner

## 2019-10-05 ENCOUNTER — Telehealth: Payer: Self-pay | Admitting: Internal Medicine

## 2019-10-05 ENCOUNTER — Inpatient Hospital Stay: Payer: 59

## 2019-10-05 ENCOUNTER — Other Ambulatory Visit: Payer: Self-pay

## 2019-10-05 ENCOUNTER — Inpatient Hospital Stay: Payer: 59 | Attending: Nurse Practitioner | Admitting: Nurse Practitioner

## 2019-10-05 VITALS — BP 145/88 | HR 62 | Temp 98.7°F | Resp 18 | Ht 73.0 in | Wt 240.7 lb

## 2019-10-05 DIAGNOSIS — C20 Malignant neoplasm of rectum: Secondary | ICD-10-CM | POA: Insufficient documentation

## 2019-10-05 DIAGNOSIS — I10 Essential (primary) hypertension: Secondary | ICD-10-CM | POA: Insufficient documentation

## 2019-10-05 LAB — CEA (IN HOUSE-CHCC): CEA (CHCC-In House): 2.44 ng/mL (ref 0.00–5.00)

## 2019-10-05 NOTE — Telephone Encounter (Signed)
Dr. Carlean Purl, there is a referral for hx of rectal cancer.  Pt had a colonoscopy with you in 2015.  2016 letter mailed to pt stated that repeat colon was not recommended at that time, but no recall was placed.  Please review and advise scheduling.

## 2019-10-05 NOTE — Progress Notes (Addendum)
[facility] OFFICE PROGRESS NOTE   Diagnosis: Rectal cancer  INTERVAL HISTORY:   Austin Robbins returns as scheduled.  He feels well.  He has a good appetite.  Colostomy functioning normally.  No nausea or vomiting.  No abdominal pain.  Objective:  Vital signs in last 24 hours:  Blood pressure (!) 145/88, pulse 62, temperature 98.7 F (37.1 C), temperature source Temporal, resp. rate 18, height 6' 1" (1.854 m), weight 240 lb 11.2 oz (109.2 kg), SpO2 99 %.    HEENT: Neck without mass. Lymphatics: No palpable cervical, supraclavicular, axillary or inguinal lymph nodes. GI: Abdomen soft and nontender.  No hepatomegaly.  Left lower quadrant colostomy.  Right lower quadrant urostomy.  Small, soft, reducible hernia right mid abdomen. Vascular: No leg edema.  Lab Results:  Lab Results  Component Value Date   WBC 8.0 06/13/2019   HGB 15.5 06/13/2019   HCT 44.9 06/13/2019   MCV 93 06/13/2019   PLT 198 06/13/2019   NEUTROABS 4.8 06/13/2019    Imaging:  No results found.  Medications: I have reviewed the patient's current medications.  Assessment/Plan: 1. Clinical stage IV (N0N,L9J,Q7) adenocarcinoma of the rectum with tumor directly extending to the bladder/prostate and CT scan evidence of metastatic chest adenopathy  Positive K-ras mutation.   Normal mismatch repair protein expression. Microsatellite stable   Status post low anterior resection and end colostomy with a cystoprostatectomy and colon conduit urinary diversion 11/01/2013.   Staging PET scan with hypermetabolic pelvic nodes, mediastinal nodes, left adrenal metastasis, and left upper lobe nodule.   Initiation of FOLFOX 12/25/2013.   Restaging CT 03/16/2014 after 6 cycles of FOLFOX confirmed improvement in chest/pelvic lymphadenopathy, a left upper lobe nodule, and resolution of a left adrenal nodule   Oxaliplatin deleted from cycle 8 FOLFOX 04/02/2014 secondary to neuropathy and  thrombocytopenia.   Oxaliplatin held with cycle 9 FOLFOX 04/16/2014 due to neuropathy.   Cycle 10 FOLFOX 04/30/2014.   Cycle 11 FOLFOX 05/15/2014.   Cycle 12 FOLFOX 05/28/2014. Oxaliplatin held due to increased neuropathy.   Restaging CT evaluation 06/08/2014 with stable mediastinal and hilar adenopathy. Stable borderline left iliac lymph node. No new or progressive disease identified within the chest, abdomen or pelvis.   "Maintenance" 5-fluorouracil beginning 06/11/2014.   Restaging CT evaluation 10/10/2014 showed similar mild mediastinal and right hilar adenopathy. No visible recurrence of the prior left upper lobe metastatic lesion or the left adrenal metastatic lesion. No new lesions noted.   Maintenance Xeloda on a 7 day on/7 day off schedule beginning 10/25/2014   Restaging CTs 03/04/2015 with stable mediastinal lymph nodes and no evidence of metastatic disease   Xeloda continued   Xeloda dose reduced beginning 04/15/2015 due to progressive hand-foot syndrome.   Xeloda placed on hold 05/15/2015 due to continued hand/foot pain.   Xeloda resumed 06/10/2015.   Restaging CTs 08/09/2015 with no evidence of disease progression   Continuation of maintenance Xeloda  CTs 08/03/2016-no evidence of metastatic disease, no pathologic chest lymphadenopathy  Continuation of maintenance Xeloda  CTs 08/09/2017-no evidence for metastatic disease in the chest, abdomen or pelvis. No evidence for local recurrence in the pelvis.  Xeloda discontinued 08/11/2017 2. Microcytic anemia-likely iron deficiency, improved.  3. Gout. 4. Port-A-Cath placement 12/18/2013.  5. Anxiety. He takes Xanax as needed. 6. History of left knee pain and swelling. 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 Trial of Cymbalta initiated 07/09/2014. Trial of amitriptyline initiated 12/11/2015.  8. History of Thrombocytopenia secondary  to chemotherapy. 9. Skin rash 04/30/2014-resolved with doxycycline 10. Chronic edema left lower leg/ankle. Negative Doppler 09/17/2014 11. Hand-foot syndrome secondary to Xeloda. Improved. 12. Enlargement of the left testicle 08/13/2015. He has been evaluated by urology. 13. Hypertension.Norvasc prescribed 05/13/2016. Resumed 10/28/2016. Reports not taking 12/02/2016 due to normal blood pressure outside of the office.Reports taking consistently 02/24/2017. 14. Right leg edema 04/07/2017. Doppler negative for DVT.    Disposition: Austin Robbins remains in clinical remission from rectal cancer.  We will follow-up on the CEA from today.  We referred him to Dr. Carlean Purl for a surveillance colonoscopy.  He will return for a CEA and follow-up visit in 4 months.  Patient seen with Dr. Benay Spice.    [name] ANP/GNP-BC   10/05/2019  12:38 PM  This was a shared visit with [name].  Austin Robbins remains in remission from rectal cancer, now 6 years out from diagnosis.  We will refer him to Dr. Carlean Purl for a surveillance colonoscopy.  Julieanne Manson, MD

## 2019-10-06 ENCOUNTER — Encounter: Payer: Self-pay | Admitting: Internal Medicine

## 2019-10-06 NOTE — Telephone Encounter (Signed)
Please arrange a direct colonoscopy for hx rectal cancer

## 2019-10-10 ENCOUNTER — Telehealth: Payer: Self-pay | Admitting: Oncology

## 2019-10-10 NOTE — Telephone Encounter (Signed)
Scheduled per los. Called and left msg. Mailed printout  °

## 2019-10-12 ENCOUNTER — Other Ambulatory Visit: Payer: Self-pay

## 2019-10-12 ENCOUNTER — Ambulatory Visit (AMBULATORY_SURGERY_CENTER): Payer: Self-pay | Admitting: *Deleted

## 2019-10-12 VITALS — Temp 97.8°F | Ht 73.0 in | Wt 242.0 lb

## 2019-10-12 DIAGNOSIS — Z85048 Personal history of other malignant neoplasm of rectum, rectosigmoid junction, and anus: Secondary | ICD-10-CM

## 2019-10-12 DIAGNOSIS — Z01818 Encounter for other preprocedural examination: Secondary | ICD-10-CM

## 2019-10-12 MED ORDER — NA SULFATE-K SULFATE-MG SULF 17.5-3.13-1.6 GM/177ML PO SOLN: ORAL | 0 refills | Status: DC

## 2019-10-12 NOTE — Progress Notes (Signed)
Patient is here in-person for PV. Patient denies any allergies to eggs or soy. Patient denies any problems with anesthesia/sedation. Patient denies any oxygen use at home. Patient denies taking any diet/weight loss medications or blood thinners. Patient is not being treated for MRSA or C-diff. EMMI education assisgned to the patient for the procedure, this was explained and instructions given to patient. COVID-19 screening test is on 2/15, the pt is aware. Pt is aware that care partner will wait in the car during procedure; if they feel like they will be too hot or cold to wait in the car; they may wait in the 4 th floor lobby. Patient is aware to bring only one care partner. We want them to wear a mask (we do not have any that we can provide them), practice social distancing, and we will check their temperatures when they get here.  I did remind the patient that their care partner needs to stay in the parking lot the entire time and have a cell phone available, we will call them when the pt is ready for discharge. Patient will wear mask into building.    Suprep $15 off coupon given to the patient.

## 2019-10-23 ENCOUNTER — Other Ambulatory Visit (HOSPITAL_COMMUNITY)
Admission: RE | Admit: 2019-10-23 | Discharge: 2019-10-23 | Disposition: A | Discharge status: Home / Self Care | Payer: 59 | Source: Ambulatory Visit | Attending: Internal Medicine | Admitting: Internal Medicine

## 2019-10-23 ENCOUNTER — Other Ambulatory Visit: Payer: Self-pay

## 2019-10-23 DIAGNOSIS — Z01812 Encounter for preprocedural laboratory examination: Secondary | ICD-10-CM | POA: Diagnosis present

## 2019-10-23 DIAGNOSIS — Z20822 Contact with and (suspected) exposure to covid-19: Secondary | ICD-10-CM | POA: Diagnosis not present

## 2019-10-23 LAB — SARS CORONAVIRUS 2 (TAT 6-24 HRS): SARS Coronavirus 2: NEGATIVE

## 2019-10-24 ENCOUNTER — Telehealth: Payer: Self-pay | Admitting: Family Medicine

## 2019-10-24 NOTE — Telephone Encounter (Signed)
What is the name of the medication? Austin Robbins  Have you contacted your pharmacy to request a refill? Yes  Which pharmacy would you like this sent to? [facility]   Patient notified that their request is being sent to the clinical staff for review and that they should receive a call once it is complete. If they do not receive a call within 24 hours they can check with their pharmacy or our office.   [name]' pt.  Please call wife bc they change Insurance & now they won't pay.

## 2019-10-24 NOTE — Telephone Encounter (Signed)
Spoke with wife and advised we have never ordered any of his colostomy supplies and she states she thought it was his oncologist or GI so she will contact them. She also requested f/u appt with Dr Livia Snellen for refills on pt xanax. Pt scheduled for 11/02/19 at 9:55.

## 2019-10-25 ENCOUNTER — Telehealth: Payer: Self-pay | Admitting: *Deleted

## 2019-10-25 NOTE — Telephone Encounter (Signed)
Wife left VM reporting he is in need of ostomy supplies. Called back and left VM that supply company needs to fax the order form completed with what supplies he needs to [phone] and MD will sign and return.

## 2019-10-26 ENCOUNTER — Encounter: Payer: 59 | Admitting: Internal Medicine

## 2019-11-01 ENCOUNTER — Encounter: Payer: Self-pay | Admitting: *Deleted

## 2019-11-01 NOTE — Progress Notes (Signed)
Signed ostomy supply orders faxed to [employer] at [phone]. Copy to HIM for scanning.

## 2019-11-02 ENCOUNTER — Other Ambulatory Visit: Payer: Self-pay

## 2019-11-02 ENCOUNTER — Ambulatory Visit (INDEPENDENT_AMBULATORY_CARE_PROVIDER_SITE_OTHER): Payer: 59 | Admitting: Family Medicine

## 2019-11-02 ENCOUNTER — Encounter: Payer: Self-pay | Admitting: Family Medicine

## 2019-11-02 VITALS — BP 122/81 | HR 57 | Temp 98.4°F | Ht 73.0 in | Wt 240.8 lb

## 2019-11-02 DIAGNOSIS — E782 Mixed hyperlipidemia: Secondary | ICD-10-CM

## 2019-11-02 DIAGNOSIS — I10 Essential (primary) hypertension: Secondary | ICD-10-CM | POA: Diagnosis not present

## 2019-11-02 DIAGNOSIS — F410 Panic disorder [episodic paroxysmal anxiety] without agoraphobia: Secondary | ICD-10-CM

## 2019-11-02 DIAGNOSIS — M1A00X0 Idiopathic chronic gout, unspecified site, without tophus (tophi): Secondary | ICD-10-CM

## 2019-11-02 DIAGNOSIS — C20 Malignant neoplasm of rectum: Secondary | ICD-10-CM | POA: Diagnosis not present

## 2019-11-02 DIAGNOSIS — N529 Male erectile dysfunction, unspecified: Secondary | ICD-10-CM

## 2019-11-02 MED ORDER — INDOMETHACIN 50 MG PO CAPS: 50.0000 mg | ORAL_CAPSULE | Freq: Three times a day (TID) | ORAL | 1 refills | Status: DC | PRN

## 2019-11-02 MED ORDER — SILDENAFIL CITRATE 50 MG PO TABS: 50.0000 mg | ORAL_TABLET | ORAL | 0 refills | Status: DC

## 2019-11-02 MED ORDER — ALPRAZOLAM 1 MG PO TABS: ORAL_TABLET | ORAL | 1 refills | Status: DC

## 2019-11-02 MED ORDER — AMLODIPINE BESYLATE 5 MG PO TABS: 5.0000 mg | ORAL_TABLET | Freq: Every day | ORAL | 1 refills | Status: DC

## 2019-11-02 MED ORDER — ATORVASTATIN CALCIUM 40 MG PO TABS: 40.0000 mg | ORAL_TABLET | Freq: Every day | ORAL | 3 refills | Status: DC

## 2019-11-02 NOTE — Progress Notes (Signed)
Subjective:  Patient ID: Austin Robbins, male    DOB: [birth year]  Age: 64 y.o. MRN: [id number]  CC: Follow-up   HPI REN ASPINALL presents for  follow-up of hypertension. Patient has no history of headache chest pain or shortness of breath or recent cough. Patient also denies symptoms of TIA such as focal numbness or weakness. Patient denies side effects from medication. States taking it regularly.  Patient in for follow-up of elevated cholesterol. Doing well without complaints on current medication. Denies side effects of statin including myalgia and arthralgia and nausea. Also in today for liver function testing. Currently no chest pain, shortness of breath or other cardiovascular related symptoms noted.  Patient has a history of panic disorder.  He is taking alprazolam 3 times a day for that.  Symptoms are well controlled with the use of the medication.  Patient also has a history of gout.  No flares recently.  However he does keep indomethacin available for use for attacks.  Patient is interested in taking sildenafil for erectile dysfunction.  He can perform sexually but not as adequately has in the past as a younger man.   History [name] has a past medical history of Adenocarcinoma of rectum ([place]) (10/25/2013), Allergy, Anemia, Colon cancer ([place]) (11/03/13), Gastritis, H. pylori infection (08/07/2013), Hyperlipidemia, Hypertension (05/13/2016), Panic attacks, Personal history of colonic polyps - adenoma (10/25/2013), and S/P colostomy ([place]).   He has a past surgical history that includes Tonsillectomy; Low anterior bowel resection (11/04/2013); Cystectomy w/ ureterosigmoidostomy (11/04/2013); Bowel resection (N/A, 11/03/2013); Application if wound vac (N/A, 11/03/2013); Cystoscopy with ureteroscopy and stent placement (Bilateral, 11/03/2013); Portacath placement (Left, 12/18/2013); Port-a-cath removal (N/A, 10/07/2017); and Colonoscopy (10/25/2013).   His family history includes COPD in his mother;  Healthy in his mother; Lung cancer in his father and mother.He reports that he has never smoked. He has never used smokeless tobacco. He reports current alcohol use of about 12.0 standard drinks of alcohol per week. He reports that he does not use drugs.  No current outpatient medications on file prior to visit.   Current Facility-Administered Medications on File Prior to Visit  Medication Dose Route Frequency Provider Last Rate Last Admin  . sodium chloride 0.9 % injection 10 mL  10 mL Intravenous PRN Ladell Pier, MD   10 mL at 11/14/14 1030    ROS Review of Systems  Constitutional: Negative.   HENT: Negative.   Eyes: Negative for visual disturbance.  Respiratory: Negative for cough and shortness of breath.   Cardiovascular: Negative for chest pain and leg swelling.  Gastrointestinal: Negative for abdominal pain, diarrhea, nausea and vomiting.  Genitourinary: Negative for difficulty urinating.  Musculoskeletal: Negative for arthralgias and myalgias.  Skin: Negative for rash.  Neurological: Negative for headaches.  Psychiatric/Behavioral: Negative for sleep disturbance.    Objective:  BP 122/81   Pulse (!) 57   Temp 98.4 F (36.9 C) (Temporal)   Ht 6' 1"  (1.854 m)   Wt 240 lb 12.8 oz (109.2 kg)   BMI 31.77 kg/m   BP Readings from Last 3 Encounters:  11/02/19 122/81  10/05/19 (!) 145/88  06/13/19 137/83    Wt Readings from Last 3 Encounters:  11/02/19 240 lb 12.8 oz (109.2 kg)  10/12/19 242 lb (109.8 kg)  10/05/19 240 lb 11.2 oz (109.2 kg)     Physical Exam Constitutional:      General: He is not in acute distress.    Appearance: He is well-developed.  HENT:  Head: Normocephalic and atraumatic.     Right Ear: External ear normal.     Left Ear: External ear normal.     Nose: Nose normal.  Eyes:     Conjunctiva/sclera: Conjunctivae normal.     Pupils: Pupils are equal, round, and reactive to light.  Cardiovascular:     Rate and Rhythm: Normal rate and  regular rhythm.     Heart sounds: Normal heart sounds. No murmur.  Pulmonary:     Effort: Pulmonary effort is normal. No respiratory distress.     Breath sounds: Normal breath sounds. No wheezing or rales.  Abdominal:     Palpations: Abdomen is soft.     Tenderness: There is abdominal tenderness (  Tenderness is mild and diffuse but primarily around the 2 stomata for his colostomy and ileostomy.).  Musculoskeletal:        General: Normal range of motion.     Cervical back: Normal range of motion and neck supple.  Skin:    General: Skin is warm and dry.  Neurological:     Mental Status: He is alert and oriented to person, place, and time.     Deep Tendon Reflexes: Reflexes are normal and symmetric.  Psychiatric:        Behavior: Behavior normal.        Thought Content: Thought content normal.        Judgment: Judgment normal.       Assessment & Plan:   [place]ingo was seen today for follow-up.  Diagnoses and all orders for this visit:  Essential hypertension -     amLODipine (NORVASC) 5 MG tablet; Take 1 tablet (5 mg total) by mouth daily. Future refills per PCP -     CBC with Differential/Platelet -     CMP14+EGFR  Rectal adenocarcinoma with perforation & invasion into bladder s/p LAR/cystectomy/colon conduit 11/04/2013 -     indomethacin (INDOCIN) 50 MG capsule; Take 1 capsule (50 mg total) by mouth 3 (three) times daily as needed (for gout flare-ups). -     amLODipine (NORVASC) 5 MG tablet; Take 1 tablet (5 mg total) by mouth daily. Future refills per PCP -     CBC with Differential/Platelet -     CMP14+EGFR  Mixed hyperlipidemia -     atorvastatin (LIPITOR) 40 MG tablet; Take 1 tablet (40 mg total) by mouth daily. For cholesterol -     CBC with Differential/Platelet -     CMP14+EGFR -     Lipid panel  Erectile dysfunction, unspecified erectile dysfunction type -     sildenafil (VIAGRA) 50 MG tablet; Take 1 tablet (50 mg total) by mouth as directed. Take 1 hour prior to  sexual activity  Panic attacks -     ALPRAZolam (XANAX) 1 MG tablet; TAKE 1 TABLET EVERY 8 HOURS AS NEEDED FOR ANXIETY. Future refills per PCP  Idiopathic chronic gout without tophus, unspecified site -     Uric acid   Allergies as of 11/02/2019      Reactions   Codeine Other (See Comments)   Chest pain      Medication List       Accurate as of November 02, 2019  9:36 PM. If you have any questions, ask your nurse or doctor.        STOP taking these medications   Na Sulfate-K Sulfate-Mg Sulf 17.5-3.13-1.6 GM/177ML Soln Stopped by: Claretta Fraise, MD     TAKE these medications   ALPRAZolam 1 MG tablet Commonly  known as: XANAX TAKE 1 TABLET EVERY 8 HOURS AS NEEDED FOR ANXIETY. Future refills per PCP   amLODipine 5 MG tablet Commonly known as: Norvasc Take 1 tablet (5 mg total) by mouth daily. Future refills per PCP   atorvastatin 40 MG tablet Commonly known as: LIPITOR Take 1 tablet (40 mg total) by mouth daily. For cholesterol   indomethacin 50 MG capsule Commonly known as: INDOCIN Take 1 capsule (50 mg total) by mouth 3 (three) times daily as needed (for gout flare-ups).   sildenafil 50 MG tablet Commonly known as: Viagra Take 1 tablet (50 mg total) by mouth as directed. Take 1 hour prior to sexual activity       Meds ordered this encounter  Medications  . indomethacin (INDOCIN) 50 MG capsule    Sig: Take 1 capsule (50 mg total) by mouth 3 (three) times daily as needed (for gout flare-ups).    Dispense:  45 capsule    Refill:  1  . amLODipine (NORVASC) 5 MG tablet    Sig: Take 1 tablet (5 mg total) by mouth daily. Future refills per PCP    Dispense:  90 tablet    Refill:  1  . ALPRAZolam (XANAX) 1 MG tablet    Sig: TAKE 1 TABLET EVERY 8 HOURS AS NEEDED FOR ANXIETY. Future refills per PCP    Dispense:  90 tablet    Refill:  1  . atorvastatin (LIPITOR) 40 MG tablet    Sig: Take 1 tablet (40 mg total) by mouth daily. For cholesterol    Dispense:  90 tablet     Refill:  3  . sildenafil (VIAGRA) 50 MG tablet    Sig: Take 1 tablet (50 mg total) by mouth as directed. Take 1 hour prior to sexual activity    Dispense:  3 tablet    Refill:  0    Patient has a history of panic disorder for which he has seen a psychiatrist Dr. [name] in [place] in the past.  By agreement between [place] he is going to have those filled by me in the foreseeable future as long as he is stable.  The saves him going to [facility] and seeing a specialist for problem that is stable.  Patient also has a history of rectal adenocarcinoma invasive into the bladder and continues to use permanent ureteral  conduit and colostomy.  He is due for blood work today that is outlined above.  Of note is that he is not taking any type of medication for his uric acid elevation.  Will review uric acid level and make recommendation.  Gout gout.  Meanwhile his hypertension and cholesterol appear to be stable.  Blood work is pending.  For now medications have been renewed and no changes will be made unless his lab work shows the need. Follow-up: No follow-ups on file.  Claretta Fraise, M.D.

## 2019-11-03 LAB — LIPID PANEL
Chol/HDL Ratio: 3.7 ratio (ref 0.0–5.0)
Cholesterol, Total: 130 mg/dL (ref 100–199)
HDL: 35 mg/dL — ABNORMAL LOW (ref >39)
LDL Chol Calc (NIH): 58 mg/dL (ref 0–99)
Triglycerides: 229 mg/dL — ABNORMAL HIGH (ref 0–149)
VLDL Cholesterol Cal: 37 mg/dL (ref 5–40)

## 2019-11-03 LAB — CBC WITH DIFFERENTIAL/PLATELET
Basophils Absolute: 0 10*3/uL (ref 0.0–0.2)
Basos: 1 %
EOS (ABSOLUTE): 0.2 10*3/uL (ref 0.0–0.4)
Eos: 3 %
Hematocrit: 45.2 % (ref 37.5–51.0)
Hemoglobin: 15.6 g/dL (ref 13.0–17.7)
Immature Grans (Abs): 0 10*3/uL (ref 0.0–0.1)
Immature Granulocytes: 1 %
Lymphocytes Absolute: 2.2 10*3/uL (ref 0.7–3.1)
Lymphs: 34 %
MCH: 31.8 pg (ref 26.6–33.0)
MCHC: 34.5 g/dL (ref 31.5–35.7)
MCV: 92 fL (ref 79–97)
Monocytes Absolute: 0.5 10*3/uL (ref 0.1–0.9)
Monocytes: 8 %
Neutrophils Absolute: 3.4 10*3/uL (ref 1.4–7.0)
Neutrophils: 53 %
Platelets: 209 10*3/uL (ref 150–450)
RBC: 4.9 x10E6/uL (ref 4.14–5.80)
RDW: 12.7 % (ref 11.6–15.4)
WBC: 6.5 10*3/uL (ref 3.4–10.8)

## 2019-11-03 LAB — CMP14+EGFR
ALT: 16 IU/L (ref 0–44)
AST: 17 IU/L (ref 0–40)
Albumin/Globulin Ratio: 1.7 (ref 1.2–2.2)
Albumin: 4.1 g/dL (ref 3.8–4.8)
Alkaline Phosphatase: 92 IU/L (ref 39–117)
BUN/Creatinine Ratio: 11 (ref 10–24)
BUN: 10 mg/dL (ref 8–27)
Bilirubin Total: 0.9 mg/dL (ref 0.0–1.2)
CO2: 21 mmol/L (ref 20–29)
Calcium: 8.7 mg/dL (ref 8.6–10.2)
Chloride: 107 mmol/L — ABNORMAL HIGH (ref 96–106)
Creatinine, Ser: 0.87 mg/dL (ref 0.76–1.27)
GFR calc Af Amer: 106 mL/min/{1.73_m2} (ref >59)
GFR calc non Af Amer: 92 mL/min/{1.73_m2} (ref >59)
Globulin, Total: 2.4 g/dL (ref 1.5–4.5)
Glucose: 107 mg/dL — ABNORMAL HIGH (ref 65–99)
Potassium: 3.9 mmol/L (ref 3.5–5.2)
Sodium: 142 mmol/L (ref 134–144)
Total Protein: 6.5 g/dL (ref 6.0–8.5)

## 2019-11-07 NOTE — Progress Notes (Signed)
Hello Silvino,  Your lab result is normal and/or stable.Some minor variations that are not significant are commonly marked abnormal, but do not represent any medical problem for you.  Best regards, Lenorris Karger, M.D.

## 2019-11-10 ENCOUNTER — Other Ambulatory Visit: Payer: Self-pay | Admitting: *Deleted

## 2019-11-10 NOTE — Progress Notes (Signed)
Received fax from [facility] that there are delays in his ostomy supplies.

## 2019-11-28 ENCOUNTER — Other Ambulatory Visit (HOSPITAL_COMMUNITY)
Admission: RE | Admit: 2019-11-28 | Discharge: 2019-11-28 | Disposition: A | Discharge status: Home / Self Care | Payer: 59 | Source: Ambulatory Visit | Attending: Internal Medicine | Admitting: Internal Medicine

## 2019-11-28 ENCOUNTER — Other Ambulatory Visit: Payer: Self-pay

## 2019-11-28 DIAGNOSIS — Z01812 Encounter for preprocedural laboratory examination: Secondary | ICD-10-CM | POA: Insufficient documentation

## 2019-11-28 DIAGNOSIS — Z20822 Contact with and (suspected) exposure to covid-19: Secondary | ICD-10-CM | POA: Insufficient documentation

## 2019-11-28 LAB — SARS CORONAVIRUS 2 (TAT 6-24 HRS): SARS Coronavirus 2: NEGATIVE

## 2019-11-30 ENCOUNTER — Other Ambulatory Visit: Payer: Self-pay

## 2019-11-30 ENCOUNTER — Ambulatory Visit (AMBULATORY_SURGERY_CENTER): Payer: 59 | Admitting: Internal Medicine

## 2019-11-30 VITALS — BP 144/91 | HR 56 | Temp 97.3°F | Resp 13 | Ht 73.0 in | Wt 242.0 lb

## 2019-11-30 DIAGNOSIS — D122 Benign neoplasm of ascending colon: Secondary | ICD-10-CM | POA: Diagnosis not present

## 2019-11-30 DIAGNOSIS — D120 Benign neoplasm of cecum: Secondary | ICD-10-CM | POA: Diagnosis not present

## 2019-11-30 DIAGNOSIS — D124 Benign neoplasm of descending colon: Secondary | ICD-10-CM | POA: Diagnosis not present

## 2019-11-30 DIAGNOSIS — Z85048 Personal history of other malignant neoplasm of rectum, rectosigmoid junction, and anus: Secondary | ICD-10-CM

## 2019-11-30 DIAGNOSIS — D123 Benign neoplasm of transverse colon: Secondary | ICD-10-CM | POA: Diagnosis not present

## 2019-11-30 DIAGNOSIS — D125 Benign neoplasm of sigmoid colon: Secondary | ICD-10-CM

## 2019-11-30 MED ORDER — SODIUM CHLORIDE 0.9 % IV SOLN: 500.0000 mL | Freq: Once | INTRAVENOUS | Status: DC

## 2019-11-30 NOTE — Progress Notes (Signed)
Temp by LC and vitals by CW 

## 2019-11-30 NOTE — Patient Instructions (Addendum)
I found and removed 9 tiny polyps today. All look benign.  You also have a condition called diverticulosis - common and not usually a problem. Please read the handout provided.  I will let you know pathology results and when to have another routine colonoscopy by mail and/or My Chart.  I appreciate the opportunity to care for you. Gatha Mayer, MD, FACG  YOU HAD AN ENDOSCOPIC PROCEDURE TODAY AT [facility]:   Refer to the procedure report that was given to you for any specific questions about what was found during the examination.  If the procedure report does not answer your questions, please call your gastroenterologist to clarify.  If you requested that your care partner not be given the details of your procedure findings, then the procedure report has been included in a sealed envelope for you to review at your convenience later.  YOU SHOULD EXPECT: Some feelings of bloating in the abdomen. Passage of more gas than usual.  Walking can help get rid of the air that was put into your GI tract during the procedure and reduce the bloating. If you had a lower endoscopy (such as a colonoscopy or flexible sigmoidoscopy) you may notice spotting of blood in your stool or on the toilet paper. If you underwent a bowel prep for your procedure, you may not have a normal bowel movement for a few days.  Please Note:  You might notice some irritation and congestion in your nose or some drainage.  This is from the oxygen used during your procedure.  There is no need for concern and it should clear up in a day or so.  SYMPTOMS TO REPORT IMMEDIATELY:   Following lower endoscopy (colonoscopy or flexible sigmoidoscopy):  Excessive amounts of blood in the stool  Significant tenderness or worsening of abdominal pains  Swelling of the abdomen that is new, acute  Fever of 100F or higher     For urgent or emergent issues, a gastroenterologist can be reached at any hour by calling [phone]  [phone]. Do not use MyChart messaging for urgent concerns.    DIET:  We do recommend a small meal at first, but then you may proceed to your regular diet.  Drink plenty of fluids but you should avoid alcoholic beverages for 24 hours.  ACTIVITY:  You should plan to take it easy for the rest of today and you should NOT DRIVE or use heavy machinery until tomorrow (because of the sedation medicines used during the test).    FOLLOW UP: Our staff will call the number listed on your records 48-72 hours following your procedure to check on you and address any questions or concerns that you may have regarding the information given to you following your procedure. If we do not reach you, we will leave a message.  We will attempt to reach you two times.  During this call, we will ask if you have developed any symptoms of COVID 19. If you develop any symptoms (ie: fever, flu-like symptoms, shortness of breath, cough etc.) before then, please call [phone].  If you test positive for Covid 19 in the 2 weeks post procedure, please call and report this information to [place].    If any biopsies were taken you will be contacted by phone or by letter within the next 1-3 weeks.  Please call us at [phone] if you have not heard about the biopsies in 3 weeks.    SIGNATURES/CONFIDENTIALITY: You and/or your care partner have  signed paperwork which will be entered into your electronic medical record.  These signatures attest to the fact that that the information above on your After Visit Summary has been reviewed and is understood.  Full responsibility of the confidentiality of this discharge information lies with you and/or your care-partner.

## 2019-11-30 NOTE — Progress Notes (Signed)
Report to PACU, RN, vss, BBS= Clear.  

## 2019-11-30 NOTE — Progress Notes (Signed)
Called to room to assist during endoscopic procedure.  Patient ID and intended procedure confirmed with present staff. Received instructions for my participation in the procedure from the performing physician.  

## 2019-11-30 NOTE — Op Note (Signed)
[place] Patient Name: Austin Robbins Procedure Date: 11/30/2019 11:03 AM MRN: [id number] Endoscopist: Gatha Mayer , MD Age: 64 Referring MD:  Date of Birth: 1956-01-02 Gender: Male Account #: [id number] Procedure:                Colonoscopy Indications:              High risk colon cancer surveillance: Personal                            history of rectal cancer, Last colonoscopy: 2015 Medicines:                Propofol per Anesthesia, Monitored Anesthesia Care Procedure:                Pre-Anesthesia Assessment:                           - Prior to the procedure, a History and Physical                            was performed, and patient medications and                            allergies were reviewed. The patient's tolerance of                            previous anesthesia was also reviewed. The risks                            and benefits of the procedure and the sedation                            options and risks were discussed with the patient.                            All questions were answered, and informed consent                            was obtained. Prior Anticoagulants: The patient has                            taken no previous anticoagulant or antiplatelet                            agents. ASA Grade Assessment: II - A patient with                            mild systemic disease. After reviewing the risks                            and benefits, the patient was deemed in                            satisfactory condition to undergo the procedure.  After obtaining informed consent, the colonoscope                            was passed under direct vision. Throughout the                            procedure, the patient's blood pressure, pulse, and                            oxygen saturations were monitored continuously. The                            Colonoscope was introduced through the sigmoid        colostomy and advanced to the the cecum, identified                            by appendiceal orifice and ileocecal valve. The                            colonoscopy was performed without difficulty. The                            patient tolerated the procedure well. The quality                            of the bowel preparation was good. The bowel                            preparation used was Miralax via split dose                            instruction. The ileocecal valve and the                            appendiceal orifice were photographed. Scope In: 11:09:27 AM Scope Out: 11:30:08 AM Scope Withdrawal Time: 0 hours 19 minutes 48 seconds  Total Procedure Duration: 0 hours 20 minutes 41 seconds  Findings:                 Nine sessile polyps were found in the sigmoid                            colon, descending colon, transverse colon,                            ascending colon and cecum. The polyps were                            diminutive in size. These polyps were removed with                            a cold snare. Resection and retrieval were  complete. Verification of patient identification                            for the specimen was done. Estimated blood loss was                            minimal.                           Multiple diverticula were found in the sigmoid                            colon, descending colon, transverse colon,                            ascending colon and cecum.                           The exam was otherwise without abnormality. Complications:            No immediate complications. Estimated Blood Loss:     Estimated blood loss was minimal. Impression:               - Nine diminutive polyps in the sigmoid colon, in                            the descending colon, in the transverse colon, in                            the ascending colon and in the cecum, removed with                            a cold  snare. Resected and retrieved.                           - Diverticulosis in the sigmoid colon, in the                            descending colon, in the transverse colon, in the                            ascending colon and in the cecum.                           - The examination was otherwise normal.                           - Personal history of malignant rectal neoplasm. Dx                            2015 Recommendation:           - Patient has a contact number available for                            emergencies. The signs and symptoms  of potential                            delayed complications were discussed with the                            patient. Return to normal activities tomorrow.                            Written discharge instructions were provided to the                            patient.                           - Resume previous diet.                           - Continue present medications.                           - Repeat colonoscopy is recommended for                            surveillance. The colonoscopy date will be                            determined after pathology results from today's                            exam become available for review. Gatha Mayer, MD 11/30/2019 11:46:43 AM This report has been signed electronically.

## 2019-12-04 ENCOUNTER — Telehealth: Payer: Self-pay

## 2019-12-04 NOTE — Telephone Encounter (Signed)
  Follow up Call-  Call back number 11/30/2019  Post procedure Call Back phone  # [phone] or [phone]  Permission to leave phone message Yes  Some recent data might be hidden     Patient questions:  Do you have a fever, pain , or abdominal swelling? No. Pain Score  0 *  Have you tolerated food without any problems? Yes.    Have you been able to return to your normal activities? Yes.    Do you have any questions about your discharge instructions: Diet   No. Medications  No. Follow up visit  No.  Do you have questions or concerns about your Care? No.  Actions: * If pain score is 4 or above: No action needed, pain <4.  1. Have you developed a fever since your procedure? no  2.   Have you had an respiratory symptoms (SOB or cough) since your procedure? no  3.   Have you tested positive for COVID 19 since your procedure no  4.   Have you had any family members/close contacts diagnosed with the COVID 19 since your procedure?  no   If yes to any of these questions please route to Joylene John, RN and Erenest Rasher, RN

## 2019-12-05 ENCOUNTER — Encounter: Payer: Self-pay | Admitting: *Deleted

## 2019-12-06 ENCOUNTER — Encounter: Payer: Self-pay | Admitting: Internal Medicine

## 2019-12-06 DIAGNOSIS — Z86010 Personal history of colonic polyps: Secondary | ICD-10-CM

## 2019-12-12 ENCOUNTER — Ambulatory Visit: Payer: BC Managed Care – PPO | Admitting: Family Medicine

## 2019-12-18 ENCOUNTER — Emergency Department (HOSPITAL_COMMUNITY)
Admission: EM | Admit: 2019-12-18 | Discharge: 2019-12-18 | Disposition: A | Discharge status: Home / Self Care | Payer: 59 | Attending: Emergency Medicine | Admitting: Emergency Medicine

## 2019-12-18 ENCOUNTER — Encounter (HOSPITAL_COMMUNITY): Payer: Self-pay | Admitting: Emergency Medicine

## 2019-12-18 ENCOUNTER — Other Ambulatory Visit: Payer: Self-pay

## 2019-12-18 DIAGNOSIS — R109 Unspecified abdominal pain: Secondary | ICD-10-CM | POA: Diagnosis present

## 2019-12-18 DIAGNOSIS — Z5321 Procedure and treatment not carried out due to patient leaving prior to being seen by health care provider: Secondary | ICD-10-CM | POA: Diagnosis not present

## 2019-12-18 NOTE — ED Triage Notes (Signed)
Pt reports that while driving today started having left flank pains. Reports that radiated around towards the front. reports has urostomy and been draining per normal.

## 2019-12-20 ENCOUNTER — Emergency Department (HOSPITAL_COMMUNITY)
Admission: EM | Admit: 2019-12-20 | Discharge: 2019-12-20 | Disposition: A | Discharge status: Home / Self Care | Payer: 59 | Attending: Emergency Medicine | Admitting: Emergency Medicine

## 2019-12-20 ENCOUNTER — Emergency Department (HOSPITAL_COMMUNITY): Payer: 59

## 2019-12-20 ENCOUNTER — Ambulatory Visit
Admission: EM | Admit: 2019-12-20 | Discharge: 2019-12-20 | Disposition: A | Discharge status: Home / Self Care | Payer: 59 | Source: Home / Self Care

## 2019-12-20 ENCOUNTER — Encounter (HOSPITAL_COMMUNITY): Payer: Self-pay

## 2019-12-20 ENCOUNTER — Other Ambulatory Visit: Payer: Self-pay

## 2019-12-20 DIAGNOSIS — K9400 Colostomy complication, unspecified: Secondary | ICD-10-CM | POA: Diagnosis not present

## 2019-12-20 DIAGNOSIS — I10 Essential (primary) hypertension: Secondary | ICD-10-CM | POA: Insufficient documentation

## 2019-12-20 DIAGNOSIS — C20 Malignant neoplasm of rectum: Secondary | ICD-10-CM | POA: Insufficient documentation

## 2019-12-20 DIAGNOSIS — Z936 Other artificial openings of urinary tract status: Secondary | ICD-10-CM | POA: Diagnosis not present

## 2019-12-20 DIAGNOSIS — N3091 Cystitis, unspecified with hematuria: Secondary | ICD-10-CM | POA: Diagnosis not present

## 2019-12-20 DIAGNOSIS — R109 Unspecified abdominal pain: Secondary | ICD-10-CM | POA: Diagnosis present

## 2019-12-20 DIAGNOSIS — R319 Hematuria, unspecified: Secondary | ICD-10-CM

## 2019-12-20 LAB — COMPREHENSIVE METABOLIC PANEL
ALT: 17 U/L (ref 0–44)
AST: 14 U/L — ABNORMAL LOW (ref 15–41)
Albumin: 3.7 g/dL (ref 3.5–5.0)
Alkaline Phosphatase: 75 U/L (ref 38–126)
Anion gap: 11 (ref 5–15)
BUN: 16 mg/dL (ref 8–23)
CO2: 23 mmol/L (ref 22–32)
Calcium: 8.9 mg/dL (ref 8.9–10.3)
Chloride: 104 mmol/L (ref 98–111)
Creatinine, Ser: 1.15 mg/dL (ref 0.61–1.24)
GFR calc Af Amer: >60 mL/min (ref >60)
GFR calc non Af Amer: >60 mL/min (ref >60)
Glucose, Bld: 103 mg/dL — ABNORMAL HIGH (ref 70–99)
Potassium: 3.5 mmol/L (ref 3.5–5.1)
Sodium: 138 mmol/L (ref 135–145)
Total Bilirubin: 1.7 mg/dL — ABNORMAL HIGH (ref 0.3–1.2)
Total Protein: 7.1 g/dL (ref 6.5–8.1)

## 2019-12-20 LAB — CBC WITH DIFFERENTIAL/PLATELET
Abs Immature Granulocytes: 0.04 10*3/uL (ref 0.00–0.07)
Basophils Absolute: 0 10*3/uL (ref 0.0–0.1)
Basophils Relative: 0 %
Eosinophils Absolute: 0 10*3/uL (ref 0.0–0.5)
Eosinophils Relative: 0 %
HCT: 44 % (ref 39.0–52.0)
Hemoglobin: 15 g/dL (ref 13.0–17.0)
Immature Granulocytes: 0 %
Lymphocytes Relative: 15 %
Lymphs Abs: 1.6 10*3/uL (ref 0.7–4.0)
MCH: 31.3 pg (ref 26.0–34.0)
MCHC: 34.1 g/dL (ref 30.0–36.0)
MCV: 91.7 fL (ref 80.0–100.0)
Monocytes Absolute: 1.1 10*3/uL — ABNORMAL HIGH (ref 0.1–1.0)
Monocytes Relative: 10 %
Neutro Abs: 7.8 10*3/uL — ABNORMAL HIGH (ref 1.7–7.7)
Neutrophils Relative %: 75 %
Platelets: 194 10*3/uL (ref 150–400)
RBC: 4.8 MIL/uL (ref 4.22–5.81)
RDW: 12.8 % (ref 11.5–15.5)
WBC: 10.4 10*3/uL (ref 4.0–10.5)
nRBC: 0 % (ref 0.0–0.2)

## 2019-12-20 LAB — URINALYSIS, ROUTINE W REFLEX MICROSCOPIC
Glucose, UA: 100 mg/dL — AB
Ketones, ur: 15 mg/dL — AB
Nitrite: POSITIVE — AB
Protein, ur: >300 mg/dL — AB
Specific Gravity, Urine: 1.01 (ref 1.005–1.030)
pH: 8.5 — ABNORMAL HIGH (ref 5.0–8.0)

## 2019-12-20 LAB — URINALYSIS, MICROSCOPIC (REFLEX): RBC / HPF: >50 RBC/hpf (ref 0–5)

## 2019-12-20 MED ORDER — SODIUM CHLORIDE 0.9 % IV BOLUS
1000.0000 mL | Freq: Once | INTRAVENOUS | Status: AC
  Administered 2019-12-20: 16:00:00 1000 mL via INTRAVENOUS

## 2019-12-20 MED ORDER — SULFAMETHOXAZOLE-TRIMETHOPRIM 800-160 MG PO TABS: 1.0000 | ORAL_TABLET | Freq: Two times a day (BID) | ORAL | 0 refills | Status: AC

## 2019-12-20 MED ORDER — HYDROCODONE-ACETAMINOPHEN 5-325 MG PO TABS: 1.0000 | ORAL_TABLET | ORAL | 0 refills | Status: DC | PRN

## 2019-12-20 MED ORDER — MORPHINE SULFATE (PF) 4 MG/ML IV SOLN
4.0000 mg | Freq: Once | INTRAVENOUS | Status: AC
  Administered 2019-12-20: 4 mg via INTRAVENOUS
  Filled 2019-12-20: qty 1

## 2019-12-20 MED ORDER — SODIUM CHLORIDE 0.9 % IV SOLN
1.0000 g | Freq: Once | INTRAVENOUS | Status: AC
  Administered 2019-12-20: 16:00:00 1 g via INTRAVENOUS
  Filled 2019-12-20: qty 10

## 2019-12-20 MED ORDER — KETOROLAC TROMETHAMINE 30 MG/ML IJ SOLN
15.0000 mg | Freq: Once | INTRAMUSCULAR | Status: AC
  Administered 2019-12-20: 15 mg via INTRAVENOUS
  Filled 2019-12-20: qty 1

## 2019-12-20 NOTE — ED Provider Notes (Signed)
[facility] EMERGENCY DEPARTMENT Provider Note   CSN: [id number] Arrival date & time: 12/20/19  1326     History Chief Complaint  Patient presents with  . Hematuria    Austin Robbins is a 64 y.o. male with history of rectal cancer with invasion in to the colon and bladder status post colostomy and urostomy currently in remission who presents with left flank pain and hematuria.  Patient states that he was driving on Monday and had acute onset of severe left flank pain.  He states he could not get comfortable at home and had an episode of N/V which he thought was related to the severe pain.  His wife got home and they decided to go to [facility] long emergency department however left without being seen due to long wait times.  He took a Xanax to go to bed and the next day woke up and the pain was less severe but still persistent.  It started to radiate around to the front of his abdomen and so he thought maybe he had some constipation so took a laxative however this did not give him any relief.  He also tried ibuprofen without any relief.  Today he woke up with persistent pain and developed gross hematuria so he went to urgent care and they referred him to the ED.  He reports history of kidney stone but states he never had pain from that because it just came out in his urostomy bag.  He denies any pain over the spinal area.  In triage here it was noted that he had a low-grade fever.  He denies having a fever at home. No blood in the stool.  HPI     Past Medical History:  Diagnosis Date  . Adenocarcinoma of rectum [facility]) 10/25/2013   10/25/2013 colonoscopy  . Allergy   . Anemia   . Colon cancer ([place]) 11/03/13  . Gastritis   . H. pylori infection 08/07/2013  . Hyperlipidemia   . Hypertension 05/13/2016  . Panic attacks   . Personal history of colonic polyps - adenoma 10/25/2013  . S/P colostomy [facility])     Patient Active Problem List   Diagnosis Date Noted  . Hypertension 05/13/2016  . Rash  09/11/2015  . Peripheral edema 09/11/2015  . Testicular swelling, left 09/11/2015  . Gout 09/11/2015  . Chemotherapy-induced neuropathy ([place]) 08/06/2015  . Panic attacks   . Gastritis   . Pyuria 11/01/2013  . Anemia of chronic disease 11/01/2013  . Rectal adenocarcinoma with perforation & invasion into bladder s/p LAR/cystectomy/colon conduit 11/04/2013 11/01/2013  . Personal history of colonic polyps - adenoma 10/25/2013    Past Surgical History:  Procedure Laterality Date  . APPLICATION OF WOUND VAC N/A 11/03/2013   Procedure: APPLICATION OF WOUND VAC;  Surgeon: Leighton Ruff, MD;  Location: [facility] ORS;  Service: General;  Laterality: N/A;  . BOWEL RESECTION N/A 11/03/2013   Procedure: OPEN Viviann Spare Greig Castilla RESECTION/COLOSTOMY;  Surgeon: Leighton Ruff, MD;  Location: [facility] ORS;  Service: General;  Laterality: N/A;  . COLONOSCOPY  10/25/2013  . CYSTECTOMY W/ URETEROSIGMOIDOSTOMY  11/04/2013  . CYSTOSCOPY WITH URETEROSCOPY AND STENT PLACEMENT Bilateral 11/03/2013   Procedure: CYSTOSCOPY WITH RIGHT RETROGRADE STENT PLACEMENT , bladder biopsy,TOTAL PROSTATE WITH ENBLOCK CYSTECTOMY AND PROSTATECTOMY/ COLON CONDUIT URINARY DIVERSION/ INSERTION BILATERAL STENTS/ ILEOSTOMY;  Surgeon: Alexis Frock, MD;  Location: [facility]S;  Service: Urology;  Laterality: Bilateral;  bladder biopsy  . LOW ANTERIOR BOWEL RESECTION  11/04/2013  . PORT-A-CATH REMOVAL N/A 10/07/2017  Procedure: REMOVAL PORT-A-CATH;  Surgeon: Leighton Ruff, MD;  Location: [facility];  Service: General;  Laterality: N/A;  . PORTACATH PLACEMENT Left 12/18/2013   Procedure: INSERTION PORT-A-CATH;  Surgeon: Leighton Ruff, MD;  Location: [place];  Service: General;  Laterality: Left;  . TONSILLECTOMY         Family History  Problem Relation Age of Onset  . Healthy Mother   . COPD Mother   . Lung cancer Mother   . Lung cancer Father        smoker  . Colon cancer Neg Hx   . Colon polyps Neg Hx   . Esophageal cancer Neg Hx     . Rectal cancer Neg Hx   . Stomach cancer Neg Hx     Social History   Tobacco Use  . Smoking status: Never Smoker  . Smokeless tobacco: Never Used  Substance Use Topics  . Alcohol use: Yes    Alcohol/week: 12.0 standard drinks    Types: 12 Cans of beer per week  . Drug use: No    Home Medications Prior to Admission medications   Medication Sig Start Date End Date Taking? Authorizing Provider  ALPRAZolam (XANAX) 1 MG tablet TAKE 1 TABLET EVERY 8 HOURS AS NEEDED FOR ANXIETY. Future refills per PCP 11/02/19   Claretta Fraise, MD  amLODipine (NORVASC) 5 MG tablet Take 1 tablet (5 mg total) by mouth daily. Future refills per PCP 11/02/19   Claretta Fraise, MD  atorvastatin (LIPITOR) 40 MG tablet Take 1 tablet (40 mg total) by mouth daily. For cholesterol 11/02/19   Claretta Fraise, MD  indomethacin (INDOCIN) 50 MG capsule Take 1 capsule (50 mg total) by mouth 3 (three) times daily as needed (for gout flare-ups). 11/02/19   Claretta Fraise, MD    Allergies    Codeine  Review of Systems   Review of Systems  Constitutional: Positive for fever.  Respiratory: Negative for shortness of breath.   Cardiovascular: Negative for chest pain.  Gastrointestinal: Positive for abdominal pain, nausea (resolved) and vomiting. Negative for constipation and diarrhea.  Genitourinary: Positive for flank pain and hematuria.  All other systems reviewed and are negative.   Physical Exam Updated Vital Signs BP 132/82 (BP Location: Right Arm)   Pulse 80   Temp 100.3 F (37.9 C) (Oral)   Resp 12   SpO2 96%   Physical Exam Vitals and nursing note reviewed.  Constitutional:      General: He is not in acute distress.    Appearance: He is well-developed. He is obese. He is not ill-appearing.  HENT:     Head: Normocephalic and atraumatic.  Eyes:     General: No scleral icterus.       Right eye: No discharge.        Left eye: No discharge.     Conjunctiva/sclera: Conjunctivae normal.     Pupils: Pupils  are equal, round, and reactive to light.  Cardiovascular:     Rate and Rhythm: Normal rate and regular rhythm.  Pulmonary:     Effort: Pulmonary effort is normal. No respiratory distress.     Breath sounds: Normal breath sounds.  Abdominal:     General: There is no distension.     Palpations: Abdomen is soft.     Tenderness: There is no abdominal tenderness. There is left CVA tenderness.     Comments: Gross hematuria in the urostomy  Colostomy appears normal  Musculoskeletal:     Cervical back: Normal range  of motion.  Skin:    General: Skin is warm and dry.  Neurological:     Mental Status: He is alert and oriented to person, place, and time.  Psychiatric:        Behavior: Behavior normal.     ED Results / Procedures / Treatments   Labs (all labs ordered are listed, but only abnormal results are displayed) Labs Reviewed  COMPREHENSIVE METABOLIC PANEL - Abnormal; Notable for the following components:      Result Value   Glucose, Bld 103 (*)    AST 14 (*)    Total Bilirubin 1.7 (*)    All other components within normal limits  CBC WITH DIFFERENTIAL/PLATELET - Abnormal; Notable for the following components:   Neutro Abs 7.8 (*)    Monocytes Absolute 1.1 (*)    All other components within normal limits  URINALYSIS, ROUTINE W REFLEX MICROSCOPIC - Abnormal; Notable for the following components:   Color, Urine RED (*)    APPearance HAZY (*)    pH 8.5 (*)    Glucose, UA 100 (*)    Hgb urine dipstick LARGE (*)    Bilirubin Urine MODERATE (*)    Ketones, ur 15 (*)    Protein, ur >300 (*)    Nitrite POSITIVE (*)    Leukocytes,Ua LARGE (*)    All other components within normal limits  URINALYSIS, MICROSCOPIC (REFLEX) - Abnormal; Notable for the following components:   Bacteria, UA MANY (*)    All other components within normal limits  URINE CULTURE    EKG None  Radiology CT Renal Stone Study  Result Date: 12/20/2019 CLINICAL DATA:  Left flank pain since Monday  afternoon, woke today with hematuria in the patient's urostomy, history of prior bladder cancer in now with low-grade temperature EXAM: CT ABDOMEN AND PELVIS WITHOUT CONTRAST TECHNIQUE: Multidetector CT imaging of the abdomen and pelvis was performed following the standard protocol without IV contrast. COMPARISON:  CT 08/09/2017 FINDINGS: Lower chest: Lung bases are clear. Normal heart size. No pericardial effusion. Hepatobiliary: Diffuse hepatic hypoattenuation compatible with hepatic steatosis. No focal liver abnormality is seen. Prominent fold towards the gallbladder fundus with some dependently layering material which may reflect biliary stones or sludge. No biliary ductal dilatation or calcified intraductal gallstones are seen. Pancreas: Partial fatty replacement of the pancreas without peripancreatic inflammation or ductal dilatation. Spleen: Normal in size without focal abnormality. Adrenals/Urinary Tract: Normal adrenal glands.Patient is post cystoprostatectomy a right lower quadrant urinary conduit is noted. There are bilateral extrarenal pelves with increasing distention on the left and asymmetrically increased left perinephric stranding with the ureterectasis to the level of the anastomotic site with the ileal conduit in the right lower quadrant. No visible obstructing calculus is seen. Mild right perinephric stranding is nonspecific and similar to comparison studies. No visible or contour deforming renal lesions in either kidney or right urolithiasis. Stomach/Bowel: Distal esophagus, stomach and duodenum are unremarkable. No significant small bowel thickening or dilatation. Patient is post low anterior resection with end colostomy in the left lower quadrant. Remaining portions of the colon are unremarkable. No evidence of bowel obstruction. A normal appendix is visualized. Vascular/Lymphatic: Atherosclerotic plaque within the normal caliber aorta. Some reactive left retroperitoneal adenopathy is noted.  several prominent nodes in the bilateral groins are not significantly changed from priors. No significant pelvic sidewall adenopathy or other enlarged or enlarging adenopathy in the abdomen or pelvis. Reproductive: Post prostatectomy. No abnormality seen in the surgical bed. Other: Left retroperitoneal inflammatory changes,  as detailed above. No free fluid or free air. No bowel containing hernias. Right lower quadrant ileal conduit urostomy and left lower quadrant end colostomy detailed above. Musculoskeletal: Multilevel degenerative changes are present in the imaged portions of the spine. Some increasing discogenic changes C2-C4 when compared to prior exam. No acute osseous abnormality or suspicious osseous lesion. IMPRESSION: 1. Increasing left-sided hydronephrosis and ureterectasis to the level of the anastomosis with the ileal conduit in the right lower quadrant. No visible obstructing calculus is seen. Findings could be seen with a anastomotic stricture, recently passed stone or an ascending urinary tract infection. 2. Hepatic steatosis. 3. Prominent fold towards the gallbladder fundus with some dependently layering material which may reflect biliary stones or sludge. No biliary ductal dilatation or calcified intraductal gallstones are seen. If there is clinical concern for acute cholecystitis, right upper quadrant ultrasound could be obtained. 4. Aortic Atherosclerosis (ICD10-I70.0). Electronically Signed   By: Lovena Le M.D.   On: 12/20/2019 15:31    Procedures Procedures (including critical care time)  Medications Ordered in ED Medications  cefTRIAXone (ROCEPHIN) 1 g in sodium chloride 0.9 % 100 mL IVPB (1 g Intravenous New Bag/Given 12/20/19 1613)  morphine 4 MG/ML injection 4 mg (4 mg Intravenous Given 12/20/19 1500)  ketorolac (TORADOL) 30 MG/ML injection 15 mg (15 mg Intravenous Given 12/20/19 1459)  sodium chloride 0.9 % bolus 1,000 mL (1,000 mLs Intravenous New Bag/Given 12/20/19 1612)     ED Course  I have reviewed the triage vital signs and the nursing notes.  Pertinent labs & imaging results that were available during my care of the patient were reviewed by me and considered in my medical decision making (see chart for details).  64 year old male presents with sudden onset of left flank pain and hematuria.  He has a low-grade fever here but otherwise vital signs are reassuring.  He has mild tenderness over the left flank but no abdominal tenderness.  Urostomy bag shows gross hematuria.  Will obtain labs, urine, CT renal.  Will provide pain control.  CBC is normal.  CMP is reassuring.  Urine shows large hemoglobin, large leukocytes, nitrite positive, over 300 protein with many bacteria.  Urine culture is sent off.  CT shows increased left hydronephrosis and ureterectasis at the level of an anastomosis with ileal conduit in the right lower quadrant.  There is no kidney stone seen.  Will discuss with urology.  Discussed with Dr. Diona Fanti.  He reviewed the scans and states that patient has had similar findings in the past but they do appear slightly worse.  He recommends prescription for Bactrim and to follow-up in the office in 1 week.  I rechecked the patient he is calm and comfortable appearing.  Plan of care discussed.  Strict return precautions given.  MDM Rules/Calculators/A&P                        Final Clinical Impression(s) / ED Diagnoses Final diagnoses:  Left flank pain  Hematuria, unspecified type  Lower urinary tract infectious disease    Rx / DC Orders ED Discharge Orders    None       Recardo Evangelist, PA-C 12/20/19 2011    Sherwood Gambler, MD 12/23/19 [phone]

## 2019-12-20 NOTE — Discharge Instructions (Signed)
Take Bactrim twice daily for one week Take Ibuprofen or Tylenol for mild to moderate pain Take Norco for severe pain Please make an appointment with [facility] to be seen in the next week Return for worsening symptoms

## 2019-12-20 NOTE — ED Triage Notes (Signed)
Pt reports left lower back pain that started at 1430 on 04/12. Episode of vomiting on 04/12 but pt feels this was related to the amount of pain.  Pain felt shart and started in left lower back.  Pt reports feeling in move down and around left flank.  Went to ED on 12/18/2019 but wait was too long.  Reports blood in urine this morning.    Pt states he passed a large kidney stone through urostomy bag about one year ago.

## 2019-12-20 NOTE — ED Triage Notes (Signed)
Pt has been experiencing left flank pain since Monday afternoon. He woke up today and has hematuria in his urostomy bag. History of previous bladder cancer. Pt has a low grade temp of 100.3. Denies any nausea or vomiting.

## 2019-12-20 NOTE — ED Notes (Signed)
Provider reviewed symptoms and discussed with pt.  Pt sent to ED via private vehicle d/t need for evaluation and tx not available at [facility].  [facility] contacted and given report.

## 2019-12-21 ENCOUNTER — Ambulatory Visit: Payer: 59 | Admitting: Family Medicine

## 2019-12-21 MED FILL — Hydrocodone-Acetaminophen Tab 5-325 MG: ORAL | Qty: 6 | Status: AC

## 2019-12-23 LAB — URINE CULTURE: Culture: >=100000 — AB

## 2019-12-24 ENCOUNTER — Telehealth: Payer: Self-pay | Admitting: Emergency Medicine

## 2019-12-24 NOTE — Telephone Encounter (Signed)
Post ED Visit - Positive Culture Follow-up: Successful Patient Follow-Up  Culture assessed and recommendations reviewed by:  []  Elenor Quinones, Pharm.D. []  Heide Guile, Pharm.D., BCPS AQ-ID []  Parks Neptune, Pharm.D., BCPS []  Alycia Rossetti, Pharm.D., BCPS []  [place], Pharm.D., BCPS, AAHIVP []  Legrand Como, Pharm.D., BCPS, AAHIVP []  Salome Arnt, PharmD, BCPS []  Johnnette Gourd, PharmD, BCPS []  Hughes Better, PharmD, BCPS [x]  [name], PharmD  Positive urine culture  [x]  Patient discharged without antimicrobial prescription and treatment is now indicated []  Organism is resistant to prescribed ED discharge antimicrobial []  Patient with positive blood cultures  Changes discussed with ED provider: Rayna Sexton PA New antibiotic prescription: Augmentin 875 mg BID x ten days Called to [facility]) [phone]  Contacted patient, date 12/24/2019, time [place] 12/24/2019, 5:35 PM

## 2019-12-24 NOTE — Progress Notes (Signed)
ED Antimicrobial Stewardship Positive Culture Follow Up   Austin Robbins is an 64 y.o. male who presented to [facility] on 12/20/2019 with a chief complaint of left flank pain + hematuria with hx of bladder/colon cancer and s/p urostomy.  Urology consulted and planned Bactrim + f/u visit in clinic Chief Complaint  Patient presents with  . Hematuria    Recent Results (from the past 720 hour(s))  SARS CORONAVIRUS 2 (TAT 6-24 HRS) Nasopharyngeal Nasopharyngeal Swab     Status: None   Collection Time: 11/28/19  6:56 AM   Specimen: Nasopharyngeal Swab  Result Value Ref Range Status   SARS Coronavirus 2 NEGATIVE NEGATIVE Final    Comment: (NOTE) SARS-CoV-2 target nucleic acids are NOT DETECTED. The SARS-CoV-2 RNA is generally detectable in upper and lower respiratory specimens during the acute phase of infection. Negative results do not preclude SARS-CoV-2 infection, do not rule out co-infections with other pathogens, and should not be used as the sole basis for treatment or other patient management decisions. Negative results must be combined with clinical observations, patient history, and epidemiological information. The expected result is Negative. Fact Sheet for Patients: SugarRoll.be Fact Sheet for Healthcare Providers: https://www.woods-mathews.com/ This test is not yet approved or cleared by the [place] FDA and  has been authorized for detection and/or diagnosis of SARS-CoV-2 by FDA under an Emergency Use Authorization (EUA). This EUA will remain  in effect (meaning this test can be used) for the duration of the COVID-19 declaration under Section 56 4(b)(1) of the Act, 21 U.S.C. section 360bbb-3(b)(1), unless the authorization is terminated or revoked sooner. Performed at [facility], [place] [address] [place], [place] [address]   Urine culture     Status: Abnormal   Collection Time: 12/20/19  2:48 PM   Specimen: Urine,  Suprapubic  Result Value Ref Range Status   Specimen Description   Final    URINE, SUPRAPUBIC Performed at [facility], [address] [place], [place] [address]    Special Requests   Final    NONE Performed at [facility], [place] [address], [place], [place] [address]    Culture (A)  Final    >=100,000 COLONIES/mL PROVIDENCIA RETTGERI >=100,000 COLONIES/mL ESCHERICHIA COLI    Report Status 12/23/2019 FINAL  Final   Organism ID, Bacteria PROVIDENCIA RETTGERI (A)  Final   Organism ID, Bacteria ESCHERICHIA COLI (A)  Final      Susceptibility   Escherichia coli - MIC*    AMPICILLIN >=32 RESISTANT Resistant     CEFAZOLIN <=4 SENSITIVE Sensitive     CEFTRIAXONE <=0.25 SENSITIVE Sensitive     CIPROFLOXACIN <=0.25 SENSITIVE Sensitive     GENTAMICIN <=1 SENSITIVE Sensitive     IMIPENEM <=0.25 SENSITIVE Sensitive     NITROFURANTOIN <=16 SENSITIVE Sensitive     TRIMETH/SULFA >=320 RESISTANT Resistant     AMPICILLIN/SULBACTAM 8 SENSITIVE Sensitive     PIP/TAZO <=4 SENSITIVE Sensitive     * >=100,000 COLONIES/mL ESCHERICHIA COLI   Providencia rettgeri - MIC*    AMPICILLIN RESISTANT Resistant     CEFAZOLIN RESISTANT Resistant     CEFTRIAXONE <=0.25 SENSITIVE Sensitive     CIPROFLOXACIN <=0.25 SENSITIVE Sensitive     GENTAMICIN <=1 SENSITIVE Sensitive     IMIPENEM 1 SENSITIVE Sensitive     NITROFURANTOIN 128 RESISTANT Resistant     TRIMETH/SULFA <=20 SENSITIVE Sensitive     AMPICILLIN/SULBACTAM <=2 SENSITIVE Sensitive     PIP/TAZO <=4 SENSITIVE Sensitive     * >=  100,000 COLONIES/mL PROVIDENCIA RETTGERI    [x]  Treated with Bactrim, organism resistant to prescribed antimicrobial  New antibiotic prescription: Augmentin 875mg  BID x 10d  ED Provider: Rayna Sexton, PA-C   [name] 12/24/2019, 12:13 PM Clinical Pharmacist Monday - Friday phone -  [phone] Saturday - Sunday phone - [phone]

## 2019-12-28 ENCOUNTER — Encounter: Payer: Self-pay | Admitting: Family Medicine

## 2020-01-31 ENCOUNTER — Ambulatory Visit (INDEPENDENT_AMBULATORY_CARE_PROVIDER_SITE_OTHER): Payer: 59 | Admitting: Family Medicine

## 2020-01-31 ENCOUNTER — Encounter: Payer: Self-pay | Admitting: Family Medicine

## 2020-01-31 ENCOUNTER — Other Ambulatory Visit: Payer: Self-pay

## 2020-01-31 VITALS — BP 130/85 | HR 60 | Temp 97.9°F | Resp 20 | Ht 73.0 in | Wt 246.0 lb

## 2020-01-31 DIAGNOSIS — F410 Panic disorder [episodic paroxysmal anxiety] without agoraphobia: Secondary | ICD-10-CM

## 2020-01-31 DIAGNOSIS — G4701 Insomnia due to medical condition: Secondary | ICD-10-CM

## 2020-01-31 DIAGNOSIS — T83512A Infection and inflammatory reaction due to nephrostomy catheter, initial encounter: Secondary | ICD-10-CM

## 2020-01-31 DIAGNOSIS — I10 Essential (primary) hypertension: Secondary | ICD-10-CM

## 2020-01-31 DIAGNOSIS — R319 Hematuria, unspecified: Secondary | ICD-10-CM | POA: Diagnosis not present

## 2020-01-31 DIAGNOSIS — N390 Urinary tract infection, site not specified: Secondary | ICD-10-CM

## 2020-01-31 LAB — URINALYSIS, COMPLETE
Bilirubin, UA: NEGATIVE
Glucose, UA: NEGATIVE
Ketones, UA: NEGATIVE
Leukocytes,UA: NEGATIVE
Nitrite, UA: POSITIVE — AB
Protein,UA: NEGATIVE
RBC, UA: NEGATIVE
Specific Gravity, UA: 1.02 (ref 1.005–1.030)
Urobilinogen, Ur: 0.2 mg/dL (ref 0.2–1.0)
pH, UA: 7 (ref 5.0–7.5)

## 2020-01-31 LAB — MICROSCOPIC EXAMINATION
Epithelial Cells (non renal): NONE SEEN /hpf (ref 0–10)
Renal Epithel, UA: NONE SEEN /hpf

## 2020-01-31 MED ORDER — TRAZODONE HCL 150 MG PO TABS: ORAL_TABLET | ORAL | 5 refills | Status: DC

## 2020-01-31 MED ORDER — ALPRAZOLAM 1 MG PO TABS: ORAL_TABLET | ORAL | 2 refills | Status: DC

## 2020-01-31 MED ORDER — CIPROFLOXACIN HCL 500 MG PO TABS: 500.0000 mg | ORAL_TABLET | Freq: Two times a day (BID) | ORAL | 0 refills | Status: DC

## 2020-01-31 NOTE — Progress Notes (Signed)
Subjective:  Patient ID: Austin Robbins, male    DOB: July 21, 1956  Age: 64 y.o. MRN: [id number]  CC: Medical Management of Chronic Issues   HPI Austin Robbins presents for  follow-up of hypertension. Patient has no history of headache chest pain or shortness of breath or recent cough. Patient also denies symptoms of TIA such as focal numbness or weakness. Patient denies side effects from medication. States taking it regularly. Hematuria started 2 days ago.  He had just been treated for an UTI.  Patient wears an ileostomy.  He just noted blood in the ileostomy.  The blood has cleared now.  When he had infection recently he had severe left flank pain.  He did have a kidney stone few years ago.  He said that it had crystals in it and he does have gout so it sounds like it was likely at least partially uric acid.  The recent infection shows multiple resistances between the 2 germs that were found, Providencia and E. coli.  It appears that Cipro was the only commonly used antibiotic that would cover both.  [name] has a past medical history of Adenocarcinoma of rectum ([place]) (10/25/2013), Allergy, Anemia, Colon cancer ([place]) (11/03/13), Gastritis, H. pylori infection (08/07/2013), Hyperlipidemia, Hypertension (05/13/2016), Panic attacks, Personal history of colonic polyps - adenoma (10/25/2013), and S/P colostomy ([place]).   He has a past surgical history that includes Tonsillectomy; Low anterior bowel resection (11/04/2013); Cystectomy w/ ureterosigmoidostomy (11/04/2013); Bowel resection (N/A, 11/03/2013); Application if wound vac (N/A, 11/03/2013); Cystoscopy with ureteroscopy and stent placement (Bilateral, 11/03/2013); Portacath placement (Left, 12/18/2013); Port-a-cath removal (N/A, 10/07/2017); and Colonoscopy (10/25/2013).   His family history includes COPD in his mother; Healthy in his mother; Lung cancer in his father and mother.He reports that he has never smoked. He has never used smokeless tobacco. He  reports current alcohol use of about 12.0 standard drinks of alcohol per week. He reports that he does not use drugs.    ROS Review of Systems  Constitutional: Negative.  Negative for fever.  HENT: Negative.   Eyes: Negative for visual disturbance.  Respiratory: Negative for cough and shortness of breath.   Cardiovascular: Negative for chest pain and leg swelling.  Gastrointestinal: Negative for abdominal pain, diarrhea, nausea and vomiting.  Genitourinary: Positive for hematuria. Negative for decreased urine volume, difficulty urinating and dysuria.  Skin: Negative for rash.  Neurological: Negative for headaches.  Psychiatric/Behavioral: Positive for sleep disturbance. The patient is nervous/anxious (recently retired due to high stress).     Objective:  BP 130/85   Pulse 60   Temp 97.9 F (36.6 C) (Temporal)   Resp 20   Ht 6\' 1"  (1.854 m)   Wt 246 lb (111.6 kg)   SpO2 97%   BMI 32.46 kg/m   BP Readings from Last 3 Encounters:  01/31/20 130/85  12/20/19 111/63  12/20/19 135/83    Wt Readings from Last 3 Encounters:  01/31/20 246 lb (111.6 kg)  11/30/19 242 lb (109.8 kg)  11/02/19 240 lb 12.8 oz (109.2 kg)     Physical Exam Vitals reviewed.  Constitutional:      Appearance: He is well-developed.  HENT:     Head: Normocephalic and atraumatic.     Right Ear: Tympanic membrane and external ear normal. No decreased hearing noted.     Left Ear: Tympanic membrane and external ear normal. No decreased hearing noted.     Mouth/Throat:     Pharynx: No oropharyngeal exudate or posterior oropharyngeal  erythema.  Eyes:     Pupils: Pupils are equal, round, and reactive to light.  Cardiovascular:     Rate and Rhythm: Normal rate and regular rhythm.     Heart sounds: No murmur.  Pulmonary:     Effort: No respiratory distress.     Breath sounds: Normal breath sounds.  Abdominal:     General: Bowel sounds are normal.     Palpations: Abdomen is soft. There is no mass.      Tenderness: There is no abdominal tenderness.  Musculoskeletal:     Cervical back: Normal range of motion and neck supple.       Assessment & Plan:   [name] was seen today for medical management of chronic issues.  Diagnoses and all orders for this visit:  Urinary tract infection associated with nephrostomy catheter, initial encounter ([place])  Hematuria, unspecified type -     Urinalysis, Complete  Essential hypertension  Insomnia due to medical condition  Panic attacks -     ALPRAZolam (XANAX) 1 MG tablet; 1/2 - 1 as needed for sleep  Other orders -     traZODone (DESYREL) 150 MG tablet; Use from 1/3 to 1 tablet nightly as needed for sleep. -     ciprofloxacin (CIPRO) 500 MG tablet; Take 1 tablet (500 mg total) by mouth 2 (two) times daily. -     Microscopic Examination   Allergies as of 01/31/2020      Reactions   Codeine Other (See Comments)   Chest pain      Medication List       Accurate as of Jan 31, 2020  8:21 PM. If you have any questions, ask your nurse or doctor.        STOP taking these medications   HYDROcodone-acetaminophen 5-325 MG tablet Commonly known as: NORCO/VICODIN Stopped by: Claretta Fraise, MD     TAKE these medications   ALPRAZolam 1 MG tablet Commonly known as: XANAX 1/2 - 1 as needed for sleep What changed: additional instructions Changed by: Claretta Fraise, MD   amLODipine 5 MG tablet Commonly known as: Norvasc Take 1 tablet (5 mg total) by mouth daily. Future refills per PCP   atorvastatin 40 MG tablet Commonly known as: LIPITOR Take 1 tablet (40 mg total) by mouth daily. For cholesterol   ciprofloxacin 500 MG tablet Commonly known as: Cipro Take 1 tablet (500 mg total) by mouth 2 (two) times daily. Started by: Claretta Fraise, MD   indomethacin 50 MG capsule Commonly known as: INDOCIN Take 1 capsule (50 mg total) by mouth 3 (three) times daily as needed (for gout flare-ups).   traZODone 150 MG tablet Commonly known as:  DESYREL Use from 1/3 to 1 tablet nightly as needed for sleep. Started by: Claretta Fraise, MD       Meds ordered this encounter  Medications  . traZODone (DESYREL) 150 MG tablet    Sig: Use from 1/3 to 1 tablet nightly as needed for sleep.    Dispense:  30 tablet    Refill:  5  . ALPRAZolam (XANAX) 1 MG tablet    Sig: 1/2 - 1 as needed for sleep    Dispense:  30 tablet    Refill:  2    Cancel refills for any other open scrip for alprazolam  . ciprofloxacin (CIPRO) 500 MG tablet    Sig: Take 1 tablet (500 mg total) by mouth 2 (two) times daily.    Dispense:  20 tablet  Refill:  0    Will attempt to taper the xanax by using trazodone for sleep  Follow-up: Return in about 3 months (around 05/02/2020).  Claretta Fraise, M.D.

## 2020-02-02 ENCOUNTER — Inpatient Hospital Stay: Payer: 59

## 2020-02-02 ENCOUNTER — Other Ambulatory Visit: Payer: Self-pay

## 2020-02-02 ENCOUNTER — Inpatient Hospital Stay: Payer: 59 | Attending: Oncology | Admitting: Oncology

## 2020-02-02 VITALS — BP 141/90 | HR 71 | Temp 97.7°F | Resp 17 | Ht 73.0 in | Wt 245.1 lb

## 2020-02-02 DIAGNOSIS — C7972 Secondary malignant neoplasm of left adrenal gland: Secondary | ICD-10-CM | POA: Diagnosis not present

## 2020-02-02 DIAGNOSIS — R319 Hematuria, unspecified: Secondary | ICD-10-CM | POA: Insufficient documentation

## 2020-02-02 DIAGNOSIS — C20 Malignant neoplasm of rectum: Secondary | ICD-10-CM | POA: Insufficient documentation

## 2020-02-02 LAB — CEA (IN HOUSE-CHCC): CEA (CHCC-In House): 3.16 ng/mL (ref 0.00–5.00)

## 2020-02-02 NOTE — Progress Notes (Signed)
[facility] OFFICE PROGRESS NOTE   Diagnosis: Rectal cancer  INTERVAL HISTORY:   Austin Robbins developed left flank pain and hematuria in April.  He was seen in emergency room.  A CT revealed increased left hydronephrosis to the level of the conduit anastomosis.  No stone was seen.  He was treated for urinary tract infection.  The pain resolved, but he has persistent hematuria.  He began a course of ciprofloxacin earlier this week and the hematuria is improving.  No other complaint.  Good appetite.  Objective:  Vital signs in last 24 hours:  Blood pressure (!) 141/90, pulse 71, temperature 97.7 F (36.5 C), temperature source Oral, resp. rate 17, height _0  (1.854 m), weight 245 lb 1.6 oz (111.2 kg), SpO2 98 %.   Lymphatics: No cervical, supraclavicular, axillary, or inguinal nodes Resp: Lungs clear bilaterally Cardio: Regular rate and rhythm GI: No hepatosplenomegaly, right lower quadrant urostomy with blood-tinged urine, left lower quadrant colostomy, no mass Vascular: Trace lower leg edema bilaterally   Lab Results:  Lab Results  Component Value Date   WBC 10.4 12/20/2019   HGB 15.0 12/20/2019   HCT 44.0 12/20/2019   MCV 91.7 12/20/2019   PLT 194 12/20/2019   NEUTROABS 7.8 (H) 12/20/2019    CMP  Lab Results  Component Value Date   NA 138 12/20/2019   K 3.5 12/20/2019   CL 104 12/20/2019   CO2 23 12/20/2019   GLUCOSE 103 (H) 12/20/2019   BUN 16 12/20/2019   CREATININE 1.15 12/20/2019   CALCIUM 8.9 12/20/2019   PROT 7.1 12/20/2019   ALBUMIN 3.7 12/20/2019   AST 14 (L) 12/20/2019   ALT 17 12/20/2019   ALKPHOS 75 12/20/2019   BILITOT 1.7 (H) 12/20/2019   GFRNONAA >60 12/20/2019   GFRAA >60 12/20/2019    Lab Results  Component Value Date   CEA1 2.44 10/05/2019    Medications: I have reviewed the patient's current medications.   Assessment/Plan: 1.  Clinical stage IV (I3K,V4Q,V9) adenocarcinoma of the rectum with tumor directly extending  to the bladder/prostate and CT scan evidence of metastatic chest adenopathy  Positive K-ras mutation.   Normal mismatch repair protein expression. Microsatellite stable   Status post low anterior resection and end colostomy with a cystoprostatectomy and colon conduit urinary diversion 11/01/2013.   Staging PET scan with hypermetabolic pelvic nodes, mediastinal nodes, left adrenal metastasis, and left upper lobe nodule.   Initiation of FOLFOX 12/25/2013.   Restaging CT 03/16/2014 after 6 cycles of FOLFOX confirmed improvement in chest/pelvic lymphadenopathy, a left upper lobe nodule, and resolution of a left adrenal nodule   Oxaliplatin deleted from cycle 8 FOLFOX 04/02/2014 secondary to neuropathy and thrombocytopenia.   Oxaliplatin held with cycle 9 FOLFOX 04/16/2014 due to neuropathy.   Cycle 10 FOLFOX 04/30/2014.   Cycle 11 FOLFOX 05/15/2014.   Cycle 12 FOLFOX 05/28/2014. Oxaliplatin held due to increased neuropathy.   Restaging CT evaluation 06/08/2014 with stable mediastinal and hilar adenopathy. Stable borderline left iliac lymph node. No new or progressive disease identified within the chest, abdomen or pelvis.   "Maintenance" 5-fluorouracil beginning 06/11/2014.   Restaging CT evaluation 10/10/2014 showed similar mild mediastinal and right hilar adenopathy. No visible recurrence of the prior left upper lobe metastatic lesion or the left adrenal metastatic lesion. No new lesions noted.   Maintenance Xeloda on a 7 day on/7 day off schedule beginning 10/25/2014   Restaging CTs 03/04/2015 with stable mediastinal lymph nodes and no evidence of metastatic disease  Xeloda continued   Xeloda dose reduced beginning 04/15/2015 due to progressive hand-foot syndrome.   Xeloda placed on hold 05/15/2015 due to continued hand/foot pain.   Xeloda resumed 06/10/2015.   Restaging CTs 08/09/2015 with no evidence of disease progression   Continuation of  maintenance Xeloda  CTs 08/03/2016-no evidence of metastatic disease, no pathologic chest lymphadenopathy  Continuation of maintenance Xeloda  CTs 08/09/2017-no evidence for metastatic disease in the chest, abdomen or pelvis. No evidence for local recurrence in the pelvis.  Xeloda discontinued 08/11/2017 2. Microcytic anemia-likely iron deficiency, improved.  3. Gout. 4. Port-A-Cath placement 12/18/2013.  5. Anxiety. He takes Xanax as needed. 6. History of left knee pain and swelling. 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 Trial of Cymbalta initiated 07/09/2014. Trial of amitriptyline initiated 12/11/2015. 8. History of Thrombocytopenia secondary to chemotherapy. 9. Skin rash 04/30/2014-resolved with doxycycline 10. Chronic edema left lower leg/ankle. Negative Doppler 09/17/2014 11. Hand-foot syndrome secondary to Xeloda. Improved. 12. Enlargement of the left testicle 08/13/2015. He has been evaluated by urology. 13. Hypertension.Norvasc prescribed 05/13/2016. Resumed 10/28/2016. Reports not taking 12/02/2016 due to normal blood pressure outside of the office.Reports taking consistently 02/24/2017. 14. Right leg edema 04/07/2017. Doppler negative for DVT. 15. Left flank pain/hematuria 12/20/2019-CT with increased left hydronephrosis and ureterectasis to the level of the ileal conduit anastomosis, treated with antibiotics     Disposition: Mr. Cortopassi is in clinical remission from rectal cancer.  We will follow-up on the CEA from today.  He will return for an office visit and CEA in 6 months. He is completing a course of ciprofloxacin for treatment of recurrent hematuria.  The hematuria is most likely related to an infection versus stone.  I recommended he follow-up with urology if the hematuria does not resolve.  I encouraged Mr. Soltis to obtain the COVID-19 vaccine.  [name], MD  02/02/2020  9:48  AM

## 2020-02-06 ENCOUNTER — Telehealth: Payer: Self-pay

## 2020-02-06 NOTE — Telephone Encounter (Signed)
TC to pt per [name] NP to let him know that his the CEA is normal. Patient verbalized understanding.

## 2020-02-22 ENCOUNTER — Telehealth: Payer: Self-pay | Admitting: Family Medicine

## 2020-02-22 ENCOUNTER — Ambulatory Visit (INDEPENDENT_AMBULATORY_CARE_PROVIDER_SITE_OTHER): Payer: 59

## 2020-02-22 ENCOUNTER — Other Ambulatory Visit: Payer: Self-pay

## 2020-02-22 DIAGNOSIS — Z23 Encounter for immunization: Secondary | ICD-10-CM

## 2020-02-22 NOTE — Telephone Encounter (Signed)
Patient had small cut on finger yesterday with metal.  Has not had a Tdap vaccine in years, unsure of last injection.  No record on file for last vaccine.  Scheduled patient to come in today for Tdap vaccine.

## 2020-04-08 ENCOUNTER — Other Ambulatory Visit: Payer: Self-pay | Admitting: Family Medicine

## 2020-04-08 DIAGNOSIS — I10 Essential (primary) hypertension: Secondary | ICD-10-CM

## 2020-04-08 DIAGNOSIS — C20 Malignant neoplasm of rectum: Secondary | ICD-10-CM

## 2020-05-02 ENCOUNTER — Other Ambulatory Visit: Payer: Self-pay

## 2020-05-02 ENCOUNTER — Ambulatory Visit (INDEPENDENT_AMBULATORY_CARE_PROVIDER_SITE_OTHER): Payer: 59 | Admitting: Family Medicine

## 2020-05-02 ENCOUNTER — Encounter: Payer: Self-pay | Admitting: Family Medicine

## 2020-05-02 VITALS — BP 138/83 | HR 57 | Temp 97.8°F | Ht 73.0 in | Wt 249.4 lb

## 2020-05-02 DIAGNOSIS — Z1159 Encounter for screening for other viral diseases: Secondary | ICD-10-CM | POA: Diagnosis not present

## 2020-05-02 DIAGNOSIS — Z125 Encounter for screening for malignant neoplasm of prostate: Secondary | ICD-10-CM | POA: Diagnosis not present

## 2020-05-02 DIAGNOSIS — F410 Panic disorder [episodic paroxysmal anxiety] without agoraphobia: Secondary | ICD-10-CM

## 2020-05-02 DIAGNOSIS — E559 Vitamin D deficiency, unspecified: Secondary | ICD-10-CM

## 2020-05-02 DIAGNOSIS — C20 Malignant neoplasm of rectum: Secondary | ICD-10-CM

## 2020-05-02 DIAGNOSIS — Z114 Encounter for screening for human immunodeficiency virus [HIV]: Secondary | ICD-10-CM

## 2020-05-02 DIAGNOSIS — G4701 Insomnia due to medical condition: Secondary | ICD-10-CM

## 2020-05-02 DIAGNOSIS — I10 Essential (primary) hypertension: Secondary | ICD-10-CM

## 2020-05-02 DIAGNOSIS — E781 Pure hyperglyceridemia: Secondary | ICD-10-CM

## 2020-05-02 MED ORDER — AMLODIPINE BESYLATE 5 MG PO TABS: 5.0000 mg | ORAL_TABLET | Freq: Every day | ORAL | 1 refills | Status: DC

## 2020-05-02 MED ORDER — INDOMETHACIN 50 MG PO CAPS: 50.0000 mg | ORAL_CAPSULE | Freq: Three times a day (TID) | ORAL | 1 refills | Status: DC | PRN

## 2020-05-02 MED ORDER — ALPRAZOLAM 1 MG PO TABS: ORAL_TABLET | ORAL | 5 refills | Status: DC

## 2020-05-02 NOTE — Progress Notes (Signed)
Subjective:  Patient ID: Austin Robbins, male    DOB: 01/16/1956  Age: 64 y.o. MRN: [id number]  CC: Follow-up (3 month)   HPI Austin Robbins presents for  follow-up of hypertension. Patient has no history of headache chest pain or shortness of breath or recent cough. Patient also denies symptoms of TIA such as focal numbness or weakness. Patient denies side effects from medication. States taking it regularly.   in for follow-up of elevated cholesterol. Doing well without complaints on current medication. Denies side effects of statin including myalgia and arthralgia and nausea. Currently no chest pain, shortness of breath or other cardiovascular related symptoms noted.  Could not tolerate trazodone due to side effect of vivid dreams. They seemed so real that they  Would be disturbing all day long. They went away when he DCed trazodone. He resumed xanax. Now he is sleeping well, and he has less anxiety.   History Austin Robbins has a past medical history of Adenocarcinoma of rectum ([place]) (10/25/2013), Allergy, Anemia, Colon cancer ([place]) (11/03/13), Gastritis, H. pylori infection (08/07/2013), Hyperlipidemia, Hypertension (05/13/2016), Panic attacks, Personal history of colonic polyps - adenoma (10/25/2013), and S/P colostomy ([place]).   He has a past surgical history that includes Tonsillectomy; Low anterior bowel resection (11/04/2013); Cystectomy w/ ureterosigmoidostomy (11/04/2013); Bowel resection (N/A, 11/03/2013); Application if wound vac (N/A, 11/03/2013); Cystoscopy with ureteroscopy and stent placement (Bilateral, 11/03/2013); Portacath placement (Left, 12/18/2013); Port-a-cath removal (N/A, 10/07/2017); and Colonoscopy (10/25/2013).   His family history includes COPD in his mother; Healthy in his mother; Lung cancer in his father and mother.He reports that he has never smoked. He has never used smokeless tobacco. He reports current alcohol use of about 12.0 standard drinks of alcohol per week. He reports  that he does not use drugs.  Current Outpatient Medications on File Prior to Visit  Medication Sig Dispense Refill  . atorvastatin (LIPITOR) 40 MG tablet Take 1 tablet (40 mg total) by mouth daily. For cholesterol 90 tablet 3   Current Facility-Administered Medications on File Prior to Visit  Medication Dose Route Frequency Provider Last Rate Last Admin  . sodium chloride 0.9 % injection 10 mL  10 mL Intravenous PRN Ladell Pier, MD   10 mL at 11/14/14 1030    ROS Review of Systems  Constitutional: Negative.   HENT: Negative.   Eyes: Negative for visual disturbance.  Respiratory: Negative for cough and shortness of breath.   Cardiovascular: Negative for chest pain and leg swelling.  Gastrointestinal: Negative for abdominal pain, diarrhea, nausea and vomiting.  Genitourinary: Negative for difficulty urinating.  Musculoskeletal: Negative for arthralgias and myalgias.  Skin: Negative for rash.  Neurological: Negative for headaches.  Psychiatric/Behavioral: Positive for sleep disturbance. The patient is nervous/anxious.     Objective:  BP 138/83   Pulse (!) 57   Temp 97.8 F (36.6 C) (Temporal)   Ht _0  (1.854 m)   Wt 249 lb 6.4 oz (113.1 kg)   BMI 32.90 kg/m   BP Readings from Last 3 Encounters:  05/02/20 138/83  02/02/20 (!) 141/90  01/31/20 130/85    Wt Readings from Last 3 Encounters:  05/02/20 249 lb 6.4 oz (113.1 kg)  02/02/20 245 lb 1.6 oz (111.2 kg)  01/31/20 246 lb (111.6 kg)     Physical Exam Vitals reviewed.  Constitutional:      Appearance: He is well-developed.  HENT:     Head: Normocephalic and atraumatic.     Right Ear: Tympanic membrane and external ear  normal. No decreased hearing noted.     Left Ear: Tympanic membrane and external ear normal. No decreased hearing noted.     Mouth/Throat:     Pharynx: No oropharyngeal exudate or posterior oropharyngeal erythema.  Eyes:     Pupils: Pupils are equal, round, and reactive to light.    Cardiovascular:     Rate and Rhythm: Normal rate and regular rhythm.     Heart sounds: No murmur heard.   Pulmonary:     Effort: No respiratory distress.     Breath sounds: Normal breath sounds.  Abdominal:     General: Bowel sounds are normal.     Palpations: Abdomen is soft. There is no mass.     Tenderness: There is no abdominal tenderness.  Musculoskeletal:     Cervical back: Normal range of motion and neck supple.       Assessment & Plan:   Prin[place] was seen today for follow-up.  Diagnoses and all orders for this visit:  Need for hepatitis C screening test -     Hepatitis C antibody  Encounter for screening for HIV -     HIV Antibody (routine testing w rflx)  Screening for prostate cancer -     PSA Total (Reflex To Free)  Vitamin D deficiency -     VITAMIN D 25 Hydroxy (Vit-D Deficiency, Fractures)  Essential hypertension -     CBC with Differential/Platelet -     CMP14+EGFR -     Lipid panel -     amLODipine (NORVASC) 5 MG tablet; Take 1 tablet (5 mg total) by mouth daily.  Insomnia due to medical condition -     CBC with Differential/Platelet -     CMP14+EGFR -     ALPRAZolam (XANAX) 1 MG tablet; 1/2 - 1 as needed for sleep  Hypertriglyceridemia -     CMP14+EGFR -     Lipid panel  Panic attacks -     ALPRAZolam (XANAX) 1 MG tablet; 1/2 - 1 as needed for sleep  Rectal adenocarcinoma with perforation & invasion into bladder s/p LAR/cystectomy/colon conduit 11/04/2013 -     amLODipine (NORVASC) 5 MG tablet; Take 1 tablet (5 mg total) by mouth daily. -     indomethacin (INDOCIN) 50 MG capsule; Take 1 capsule (50 mg total) by mouth 3 (three) times daily as needed (for gout flare-ups).   Allergies as of 05/02/2020      Reactions   Codeine Other (See Comments)   Chest pain      Medication List       Accurate as of May 02, 2020  9:58 AM. If you have any questions, ask your nurse or doctor.        STOP taking these medications   ciprofloxacin 500  MG tablet Commonly known as: Cipro Stopped by: Claretta Fraise, MD   traZODone 150 MG tablet Commonly known as: DESYREL Stopped by: Claretta Fraise, MD     TAKE these medications   ALPRAZolam 1 MG tablet Commonly known as: XANAX 1/2 - 1 as needed for sleep   amLODipine 5 MG tablet Commonly known as: NORVASC Take 1 tablet (5 mg total) by mouth daily.   atorvastatin 40 MG tablet Commonly known as: LIPITOR Take 1 tablet (40 mg total) by mouth daily. For cholesterol   indomethacin 50 MG capsule Commonly known as: INDOCIN Take 1 capsule (50 mg total) by mouth 3 (three) times daily as needed (for gout flare-ups).  Meds ordered this encounter  Medications  . ALPRAZolam (XANAX) 1 MG tablet    Sig: 1/2 - 1 as needed for sleep    Dispense:  30 tablet    Refill:  5    Cancel refills for any other open scrip for alprazolam  . amLODipine (NORVASC) 5 MG tablet    Sig: Take 1 tablet (5 mg total) by mouth daily.    Dispense:  90 tablet    Refill:  1    This prescription was filled on 02/12/2020. Any refills authorized will be placed on file.  . indomethacin (INDOCIN) 50 MG capsule    Sig: Take 1 capsule (50 mg total) by mouth 3 (three) times daily as needed (for gout flare-ups).    Dispense:  45 capsule    Refill:  1      Follow-up: Return in about 6 months (around 11/02/2020) for hypertension, cholesterol.  Claretta Fraise, M.D.

## 2020-05-03 LAB — LIPID PANEL
Chol/HDL Ratio: 4.1 ratio (ref 0.0–5.0)
Cholesterol, Total: 145 mg/dL (ref 100–199)
HDL: 35 mg/dL — ABNORMAL LOW (ref >39)
LDL Chol Calc (NIH): 48 mg/dL (ref 0–99)
Triglycerides: 422 mg/dL — ABNORMAL HIGH (ref 0–149)
VLDL Cholesterol Cal: 62 mg/dL — ABNORMAL HIGH (ref 5–40)

## 2020-05-03 LAB — CBC WITH DIFFERENTIAL/PLATELET
Basophils Absolute: 0 10*3/uL (ref 0.0–0.2)
Basos: 1 %
EOS (ABSOLUTE): 0.2 10*3/uL (ref 0.0–0.4)
Eos: 3 %
Hematocrit: 45.7 % (ref 37.5–51.0)
Hemoglobin: 16 g/dL (ref 13.0–17.7)
Immature Grans (Abs): 0 10*3/uL (ref 0.0–0.1)
Immature Granulocytes: 0 %
Lymphocytes Absolute: 2 10*3/uL (ref 0.7–3.1)
Lymphs: 29 %
MCH: 32.1 pg (ref 26.6–33.0)
MCHC: 35 g/dL (ref 31.5–35.7)
MCV: 92 fL (ref 79–97)
Monocytes Absolute: 0.5 10*3/uL (ref 0.1–0.9)
Monocytes: 7 %
Neutrophils Absolute: 4.1 10*3/uL (ref 1.4–7.0)
Neutrophils: 60 %
Platelets: 182 10*3/uL (ref 150–450)
RBC: 4.99 x10E6/uL (ref 4.14–5.80)
RDW: 13 % (ref 11.6–15.4)
WBC: 6.8 10*3/uL (ref 3.4–10.8)

## 2020-05-03 LAB — CMP14+EGFR
ALT: 22 IU/L (ref 0–44)
AST: 22 IU/L (ref 0–40)
Albumin/Globulin Ratio: 1.6 (ref 1.2–2.2)
Albumin: 4.3 g/dL (ref 3.8–4.8)
Alkaline Phosphatase: 97 IU/L (ref 48–121)
BUN/Creatinine Ratio: 13 (ref 10–24)
BUN: 11 mg/dL (ref 8–27)
Bilirubin Total: 0.7 mg/dL (ref 0.0–1.2)
CO2: 24 mmol/L (ref 20–29)
Calcium: 9 mg/dL (ref 8.6–10.2)
Chloride: 103 mmol/L (ref 96–106)
Creatinine, Ser: 0.87 mg/dL (ref 0.76–1.27)
GFR calc Af Amer: 105 mL/min/{1.73_m2} (ref >59)
GFR calc non Af Amer: 91 mL/min/{1.73_m2} (ref >59)
Globulin, Total: 2.7 g/dL (ref 1.5–4.5)
Glucose: 111 mg/dL — ABNORMAL HIGH (ref 65–99)
Potassium: 4 mmol/L (ref 3.5–5.2)
Sodium: 140 mmol/L (ref 134–144)
Total Protein: 7 g/dL (ref 6.0–8.5)

## 2020-05-03 LAB — VITAMIN D 25 HYDROXY (VIT D DEFICIENCY, FRACTURES): Vit D, 25-Hydroxy: 17.9 ng/mL — ABNORMAL LOW (ref 30.0–100.0)

## 2020-05-03 LAB — HEPATITIS C ANTIBODY: Hep C Virus Ab: <0.1 s/co ratio (ref 0.0–0.9)

## 2020-05-03 LAB — PSA TOTAL (REFLEX TO FREE): Prostate Specific Ag, Serum: <0.1 ng/mL (ref 0.0–4.0)

## 2020-05-03 LAB — HIV ANTIBODY (ROUTINE TESTING W REFLEX): HIV Screen 4th Generation wRfx: NONREACTIVE

## 2020-08-05 ENCOUNTER — Inpatient Hospital Stay: Payer: 59 | Attending: Nurse Practitioner | Admitting: Nurse Practitioner

## 2020-08-05 ENCOUNTER — Other Ambulatory Visit: Payer: Self-pay

## 2020-08-05 ENCOUNTER — Encounter: Payer: Self-pay | Admitting: Nurse Practitioner

## 2020-08-05 ENCOUNTER — Telehealth: Payer: Self-pay | Admitting: Nurse Practitioner

## 2020-08-05 ENCOUNTER — Inpatient Hospital Stay: Payer: 59

## 2020-08-05 VITALS — BP 158/96 | HR 72 | Temp 97.9°F | Resp 18 | Ht 73.0 in | Wt 253.0 lb

## 2020-08-05 DIAGNOSIS — C20 Malignant neoplasm of rectum: Secondary | ICD-10-CM

## 2020-08-05 DIAGNOSIS — Z23 Encounter for immunization: Secondary | ICD-10-CM | POA: Insufficient documentation

## 2020-08-05 DIAGNOSIS — C7972 Secondary malignant neoplasm of left adrenal gland: Secondary | ICD-10-CM | POA: Diagnosis not present

## 2020-08-05 LAB — CEA (IN HOUSE-CHCC): CEA (CHCC-In House): 2.26 ng/mL (ref 0.00–5.00)

## 2020-08-05 MED ORDER — INFLUENZA VAC SPLIT QUAD 0.5 ML IM SUSY
0.5000 mL | PREFILLED_SYRINGE | Freq: Once | INTRAMUSCULAR | Status: AC
  Administered 2020-08-05: 0.5 mL via INTRAMUSCULAR

## 2020-08-05 MED ORDER — INFLUENZA VAC SPLIT QUAD 0.5 ML IM SUSY
PREFILLED_SYRINGE | INTRAMUSCULAR | Status: AC
  Filled 2020-08-05: qty 0.5

## 2020-08-05 NOTE — Progress Notes (Signed)
[facility] OFFICE PROGRESS NOTE   Diagnosis: Rectal cancer  INTERVAL HISTORY:   Austin Robbins returns as scheduled.  Hematuria reported at the time of his last visit has resolved.  Overall he feels well.  He has good appetite.  He denies pain.  Colostomy functioning normally.  He wonders if he may have a hernia involving the right lower quadrant urostomy.  Objective:  Vital signs in last 24 hours:  Blood pressure (!) 158/96, pulse 72, temperature 97.9 F (36.6 C), temperature source Tympanic, resp. rate 18, height _0  (1.854 m), weight 253 lb (114.8 kg), SpO2 98 %.    HEENT: Neck without mass. Lymphatics: No palpable cervical, supraclavicular, axillary or inguinal lymph nodes. Resp: Lungs clear bilaterally. Cardio: Regular rate and rhythm. GI: Abdomen soft and nontender.  No hepatosplenomegaly.  Right lower quadrant urostomy and left lower quadrant colostomy.  No mass. Vascular: Trace bilateral pretibial/ankle edema.    Lab Results:  Lab Results  Component Value Date   WBC 6.8 05/02/2020   HGB 16.0 05/02/2020   HCT 45.7 05/02/2020   MCV 92 05/02/2020   PLT 182 05/02/2020   NEUTROABS 4.1 05/02/2020    Imaging:  No results found.  Medications: I have reviewed the patient's current medications.  Assessment/Plan: 1.  Clinical stage IV (L4Y,T0P,T4) adenocarcinoma of the rectum with tumor directly extending to the bladder/prostate and CT scan evidence of metastatic chest adenopathy  Positive K-ras mutation.   Normal mismatch repair protein expression. Microsatellite stable   Status post low anterior resection and end colostomy with a cystoprostatectomy and colon conduit urinary diversion 11/01/2013.   Staging PET scan with hypermetabolic pelvic nodes, mediastinal nodes, left adrenal metastasis, and left upper lobe nodule.   Initiation of FOLFOX 12/25/2013.   Restaging CT 03/16/2014 after 6 cycles of FOLFOX confirmed improvement in chest/pelvic  lymphadenopathy, a left upper lobe nodule, and resolution of a left adrenal nodule   Oxaliplatin deleted from cycle 8 FOLFOX 04/02/2014 secondary to neuropathy and thrombocytopenia.   Oxaliplatin held with cycle 9 FOLFOX 04/16/2014 due to neuropathy.   Cycle 10 FOLFOX 04/30/2014.   Cycle 11 FOLFOX 05/15/2014.   Cycle 12 FOLFOX 05/28/2014. Oxaliplatin held due to increased neuropathy.   Restaging CT evaluation 06/08/2014 with stable mediastinal and hilar adenopathy. Stable borderline left iliac lymph node. No new or progressive disease identified within the chest, abdomen or pelvis.   "Maintenance" 5-fluorouracil beginning 06/11/2014.   Restaging CT evaluation 10/10/2014 showed similar mild mediastinal and right hilar adenopathy. No visible recurrence of the prior left upper lobe metastatic lesion or the left adrenal metastatic lesion. No new lesions noted.   Maintenance Xeloda on a 7 day on/7 day off schedule beginning 10/25/2014   Restaging CTs 03/04/2015 with stable mediastinal lymph nodes and no evidence of metastatic disease   Xeloda continued   Xeloda dose reduced beginning 04/15/2015 due to progressive hand-foot syndrome.   Xeloda placed on hold 05/15/2015 due to continued hand/foot pain.   Xeloda resumed 06/10/2015.   Restaging CTs 08/09/2015 with no evidence of disease progression   Continuation of maintenance Xeloda  CTs 08/03/2016-no evidence of metastatic disease, no pathologic chest lymphadenopathy  Continuation of maintenance Xeloda  CTs 08/09/2017-no evidence for metastatic disease in the chest, abdomen or pelvis. No evidence for local recurrence in the pelvis.  Xeloda discontinued12/01/2017 2. Microcytic anemia-likely iron deficiency, improved.  3. Gout. 4. Port-A-Cath placement 12/18/2013.  5. Anxiety. He takes Xanax as needed. 6. History of left knee pain and swelling.  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 Trial of Cymbalta initiated 07/09/2014. Trial of amitriptyline initiated 12/11/2015. 8. History of Thrombocytopenia secondary to chemotherapy. 9. Skin rash 04/30/2014-resolved with doxycycline 10. Chronic edema left lower leg/ankle. Negative Doppler 09/17/2014 11. Hand-foot syndrome secondary to Xeloda. Improved. 12. Enlargement of the left testicle 08/13/2015. He has been evaluated by urology. 13. Hypertension.Norvasc prescribed 05/13/2016. Resumed 10/28/2016. Reports not taking 12/02/2016 due to normal blood pressure outside of the office.Reports taking consistently 02/24/2017. 14. Right leg edema 04/07/2017. Doppler negative for DVT. 15. Left flank pain/hematuria 12/20/2019-CT with increased left hydronephrosis and ureterectasis to the level of the ileal conduit anastomosis, treated with antibiotics     Disposition: [name] remains in clinical remission from rectal cancer.  We will follow-up on the CEA from today.  I recommended he contact his urologist regarding concern for a hernia at the urostomy site.  He will return for a CEA and follow-up visit in 6 months.  He will receive the influenza vaccine today.  [name] ANP/GNP-BC   08/05/2020  9:32 AM

## 2020-08-05 NOTE — Telephone Encounter (Signed)
Scheduled per los. Gave avs and calendar  

## 2020-08-15 ENCOUNTER — Other Ambulatory Visit: Payer: Self-pay | Admitting: Family Medicine

## 2020-08-15 DIAGNOSIS — C20 Malignant neoplasm of rectum: Secondary | ICD-10-CM

## 2020-08-15 DIAGNOSIS — I10 Essential (primary) hypertension: Secondary | ICD-10-CM

## 2020-08-15 DIAGNOSIS — E782 Mixed hyperlipidemia: Secondary | ICD-10-CM

## 2020-11-04 ENCOUNTER — Encounter: Payer: Self-pay | Admitting: Family Medicine

## 2020-11-04 ENCOUNTER — Telehealth: Payer: Self-pay

## 2020-11-04 ENCOUNTER — Other Ambulatory Visit: Payer: Self-pay

## 2020-11-04 ENCOUNTER — Ambulatory Visit (INDEPENDENT_AMBULATORY_CARE_PROVIDER_SITE_OTHER): Payer: 59 | Admitting: Family Medicine

## 2020-11-04 VITALS — BP 141/90 | HR 64 | Temp 98.0°F | Ht 73.0 in | Wt 247.8 lb

## 2020-11-04 DIAGNOSIS — G4701 Insomnia due to medical condition: Secondary | ICD-10-CM

## 2020-11-04 DIAGNOSIS — I10 Essential (primary) hypertension: Secondary | ICD-10-CM

## 2020-11-04 DIAGNOSIS — C20 Malignant neoplasm of rectum: Secondary | ICD-10-CM

## 2020-11-04 DIAGNOSIS — M109 Gout, unspecified: Secondary | ICD-10-CM

## 2020-11-04 DIAGNOSIS — F410 Panic disorder [episodic paroxysmal anxiety] without agoraphobia: Secondary | ICD-10-CM

## 2020-11-04 MED ORDER — ALPRAZOLAM 1 MG PO TABS: ORAL_TABLET | ORAL | 5 refills | Status: DC

## 2020-11-04 MED ORDER — INDOMETHACIN 50 MG PO CAPS: 50.0000 mg | ORAL_CAPSULE | Freq: Three times a day (TID) | ORAL | 1 refills | Status: DC | PRN

## 2020-11-04 NOTE — Progress Notes (Addendum)
 Subjective:  Patient ID: Austin Robbins, male    DOB: 11/14/1955  Age: 65 y.o. MRN: [id number]  CC: Hypertension (6 month follow up)   HPI Austin Robbins presents for grieving over sudden loss of his wife last month. Only taking the xanax at bedtime.  He states that she was taking 14 different medicines. She never mentioned any of them to him.    Follow-up of hypertension. Patient has no history of headache chest pain or shortness of breath or recent cough. Patient also denies symptoms of TIA such as numbness weakness lateralizing. Patient checks  blood pressure at home and has not had any elevated readings recently. Patient denies side effects from his medication. States taking it regularly.  Patient states that he has had 1 attack of gout recently.  This resolved with just a day or 2 use of the indomethacin   Pt. s/p urostomy and colonoscopy as a result of Ca Colon. Needs new supplies for each for appropriate hygiene.   Depression screen PHQ 2/9 11/04/2020 05/02/2020 01/31/2020  Decreased Interest 0 0 0  Down, Depressed, Hopeless 1 0 0  PHQ - 2 Score 1 0 0  Some recent data might be hidden    History Karell has a past medical history of Adenocarcinoma of rectum (HCC) (10/25/2013), Allergy, Anemia, Colon cancer (HCC) (11/03/13), Gastritis, H. pylori infection (08/07/2013), Hyperlipidemia, Hypertension (05/13/2016), Panic attacks, Personal history of colonic polyps - adenoma (10/25/2013), and S/P colostomy (HCC).   He has a past surgical history that includes Tonsillectomy; Low anterior bowel resection (11/04/2013); Cystectomy w/ ureterosigmoidostomy (11/04/2013); Bowel resection (N/A, 11/03/2013); Application if wound vac (N/A, 11/03/2013); Cystoscopy with ureteroscopy and stent placement (Bilateral, 11/03/2013); Portacath placement (Left, 12/18/2013); Port-a-cath removal (N/A, 10/07/2017); and Colonoscopy (10/25/2013).   His family history includes COPD in his mother; Healthy in his mother; Lung  cancer in his father and mother.He reports that he has never smoked. He has never used smokeless tobacco. He reports current alcohol use of about 12.0 standard drinks of alcohol per week. He reports that he does not use drugs.    ROS Review of Systems  Constitutional: Negative for fever.  Respiratory: Negative for shortness of breath.   Cardiovascular: Negative for chest pain.  Musculoskeletal: Negative for arthralgias.  Skin: Negative for rash.    Objective:  BP (!) 141/90   Pulse 64   Temp 98 F (36.7 C) (Temporal)   Ht 6' 1" (1.854 m)   Wt 247 lb 12.8 oz (112.4 kg)   SpO2 97%   BMI 32.69 kg/m   BP Readings from Last 3 Encounters:  11/04/20 (!) 141/90  08/05/20 (!) 158/96  05/02/20 138/83    Wt Readings from Last 3 Encounters:  11/04/20 247 lb 12.8 oz (112.4 kg)  08/05/20 253 lb (114.8 kg)  05/02/20 249 lb 6.4 oz (113.1 kg)     Physical Exam Vitals reviewed.  Constitutional:      Appearance: He is well-developed and well-nourished.  HENT:     Head: Normocephalic and atraumatic.     Right Ear: Tympanic membrane and external ear normal. No decreased hearing noted.     Left Ear: Tympanic membrane and external ear normal. No decreased hearing noted.     Mouth/Throat:     Pharynx: No oropharyngeal exudate or posterior oropharyngeal erythema.  Eyes:     Pupils: Pupils are equal, round, and reactive to light.  Cardiovascular:     Rate and Rhythm: Normal rate and regular rhythm.       Heart sounds: No murmur heard.   Pulmonary:     Effort: No respiratory distress.     Breath sounds: Normal breath sounds.  Abdominal:     General: Bowel sounds are normal.     Palpations: Abdomen is soft. There is no mass.     Tenderness: There is no abdominal tenderness.  Musculoskeletal:     Cervical back: Normal range of motion and neck supple.       Assessment & Plan:   Azad was seen today for hypertension.  Diagnoses and all orders for this visit:  Primary  hypertension -     CMP14+EGFR -     CBC with Differential/Platelet -     Lipid panel -     Uric acid  Rectal adenocarcinoma with perforation & invasion into bladder s/p LAR/cystectomy/colon conduit 11/04/2013 -     indomethacin (INDOCIN) 50 MG capsule; Take 1 capsule (50 mg total) by mouth 3 (three) times daily as needed (for gout flare-ups).  Insomnia due to medical condition -     ALPRAZolam (XANAX) 1 MG tablet; 1/2 - 1 as needed for sleep  Panic attacks -     ALPRAZolam (XANAX) 1 MG tablet; 1/2 - 1 as needed for sleep  Acute gout of left foot, unspecified cause -     Uric acid       I am having [name] maintain his atorvastatin, amLODipine, indomethacin, and ALPRAZolam.  Allergies as of 11/04/2020      Reactions   Codeine Other (See Comments)   Chest pain      Medication List       Accurate as of November 04, 2020  7:27 PM. If you have any questions, ask your nurse or doctor.        ALPRAZolam 1 MG tablet Commonly known as: XANAX 1/2 - 1 as needed for sleep   amLODipine 5 MG tablet Commonly known as: NORVASC TAKE ONE TABLET BY MOUTH DAILY   atorvastatin 40 MG tablet Commonly known as: LIPITOR TAKE ONE TABLET BY MOUTH DAILY FOR CHOLESTEROL   indomethacin 50 MG capsule Commonly known as: INDOCIN Take 1 capsule (50 mg total) by mouth 3 (three) times daily as needed (for gout flare-ups).        Follow-up: Return in about 6 weeks (around 12/16/2020).  Claretta Fraise, M.D.

## 2020-11-04 NOTE — Telephone Encounter (Signed)
  Prescription Request  11/04/2020  What is the name of the medication or equipment? Needs RX for Ostomy supplies  Have you contacted your pharmacy to request a refill? (if applicable) YES  Which pharmacy would you like this sent to? [facility]  Patient notified that their request is being sent to the clinical staff for review and that they should receive a response within 2 business days.

## 2020-11-04 NOTE — Telephone Encounter (Signed)
Form filled out to be placed on providers desk

## 2020-11-05 ENCOUNTER — Other Ambulatory Visit: Payer: Self-pay | Admitting: Family Medicine

## 2020-11-05 LAB — CMP14+EGFR
ALT: 21 IU/L (ref 0–44)
AST: 19 IU/L (ref 0–40)
Albumin/Globulin Ratio: 1.5 (ref 1.2–2.2)
Albumin: 4.3 g/dL (ref 3.8–4.8)
Alkaline Phosphatase: 92 IU/L (ref 44–121)
BUN/Creatinine Ratio: 11 (ref 10–24)
BUN: 11 mg/dL (ref 8–27)
Bilirubin Total: 0.8 mg/dL (ref 0.0–1.2)
CO2: 21 mmol/L (ref 20–29)
Calcium: 9 mg/dL (ref 8.6–10.2)
Chloride: 105 mmol/L (ref 96–106)
Creatinine, Ser: 1.03 mg/dL (ref 0.76–1.27)
Globulin, Total: 2.8 g/dL (ref 1.5–4.5)
Glucose: 106 mg/dL — ABNORMAL HIGH (ref 65–99)
Potassium: 4.5 mmol/L (ref 3.5–5.2)
Sodium: 142 mmol/L (ref 134–144)
Total Protein: 7.1 g/dL (ref 6.0–8.5)
eGFR: 81 mL/min/{1.73_m2} (ref >59)

## 2020-11-05 LAB — CBC WITH DIFFERENTIAL/PLATELET
Basophils Absolute: 0 10*3/uL (ref 0.0–0.2)
Basos: 0 %
EOS (ABSOLUTE): 0.3 10*3/uL (ref 0.0–0.4)
Eos: 3 %
Hematocrit: 49.4 % (ref 37.5–51.0)
Hemoglobin: 16.5 g/dL (ref 13.0–17.7)
Immature Grans (Abs): 0 10*3/uL (ref 0.0–0.1)
Immature Granulocytes: 0 %
Lymphocytes Absolute: 2.3 10*3/uL (ref 0.7–3.1)
Lymphs: 28 %
MCH: 30.4 pg (ref 26.6–33.0)
MCHC: 33.4 g/dL (ref 31.5–35.7)
MCV: 91 fL (ref 79–97)
Monocytes Absolute: 0.6 10*3/uL (ref 0.1–0.9)
Monocytes: 8 %
Neutrophils Absolute: 5 10*3/uL (ref 1.4–7.0)
Neutrophils: 61 %
Platelets: 207 10*3/uL (ref 150–450)
RBC: 5.43 x10E6/uL (ref 4.14–5.80)
RDW: 13.6 % (ref 11.6–15.4)
WBC: 8.2 10*3/uL (ref 3.4–10.8)

## 2020-11-05 LAB — LIPID PANEL
Chol/HDL Ratio: 4.2 ratio (ref 0.0–5.0)
Cholesterol, Total: 147 mg/dL (ref 100–199)
HDL: 35 mg/dL — ABNORMAL LOW (ref >39)
LDL Chol Calc (NIH): 55 mg/dL (ref 0–99)
Triglycerides: 374 mg/dL — ABNORMAL HIGH (ref 0–149)
VLDL Cholesterol Cal: 57 mg/dL — ABNORMAL HIGH (ref 5–40)

## 2020-11-05 LAB — URIC ACID: Uric Acid: 7.7 mg/dL (ref 3.8–8.4)

## 2020-11-05 MED ORDER — FENOFIBRATE 160 MG PO TABS: 160.0000 mg | ORAL_TABLET | Freq: Every day | ORAL | 3 refills | Status: DC

## 2020-11-06 NOTE — Telephone Encounter (Signed)
Pt aware form faxed to [facility]

## 2020-11-26 ENCOUNTER — Encounter: Payer: Self-pay | Admitting: Family Medicine

## 2020-11-26 ENCOUNTER — Ambulatory Visit: Payer: Self-pay | Admitting: Family Medicine

## 2020-12-02 ENCOUNTER — Ambulatory Visit (INDEPENDENT_AMBULATORY_CARE_PROVIDER_SITE_OTHER): Payer: 59 | Admitting: Family Medicine

## 2020-12-02 ENCOUNTER — Other Ambulatory Visit: Payer: Self-pay

## 2020-12-02 ENCOUNTER — Encounter: Payer: Self-pay | Admitting: Family Medicine

## 2020-12-02 VITALS — BP 131/77 | HR 61 | Temp 98.1°F | Ht 73.0 in | Wt 246.6 lb

## 2020-12-02 DIAGNOSIS — Z936 Other artificial openings of urinary tract status: Secondary | ICD-10-CM

## 2020-12-02 DIAGNOSIS — I10 Essential (primary) hypertension: Secondary | ICD-10-CM | POA: Diagnosis not present

## 2020-12-02 NOTE — Progress Notes (Signed)
Subjective:  Patient ID: Austin Robbins, male    DOB: 1955-10-23  Age: 65 y.o. MRN: [id number]  CC: Hypertension   HPI Austin Robbins presents for  follow-up of hypertension. Patient has no history of headache chest pain or shortness of breath or recent cough. Patient also denies symptoms of TIA such as focal numbness or weakness. Patient denies side effects from medication. States taking it regularly.  Stoma for urine has herniated. It is causing leakage. Also the bags aren't staying on as long,  Depression screen [facility] 2/9 12/02/2020 11/04/2020 05/02/2020 01/31/2020 11/02/2019  Decreased Interest 0 0 0 0 0  Down, Depressed, Hopeless 0 1 0 0 0  PHQ - 2 Score 0 1 0 0 0  Some recent data might be hidden     History Austin Robbins has a past medical history of Adenocarcinoma of rectum ([place]) (10/25/2013), Allergy, Anemia, Colon cancer ([place]) (11/03/13), Gastritis, H. pylori infection (08/07/2013), Hyperlipidemia, Hypertension (05/13/2016), Panic attacks, Personal history of colonic polyps - adenoma (10/25/2013), and S/P colostomy ([place]).   He has a past surgical history that includes Tonsillectomy; Low anterior bowel resection (11/04/2013); Cystectomy w/ ureterosigmoidostomy (11/04/2013); Bowel resection (N/A, 11/03/2013); Application if wound vac (N/A, 11/03/2013); Cystoscopy with ureteroscopy and stent placement (Bilateral, 11/03/2013); Portacath placement (Left, 12/18/2013); Port-a-cath removal (N/A, 10/07/2017); and Colonoscopy (10/25/2013).   His family history includes COPD in his mother; Healthy in his mother; Lung cancer in his father and mother.He reports that he has never smoked. He has never used smokeless tobacco. He reports current alcohol use of about 12.0 standard drinks of alcohol per week. He reports that he does not use drugs.  Current Outpatient Medications on File Prior to Visit  Medication Sig Dispense Refill  . ALPRAZolam (XANAX) 1 MG tablet 1/2 - 1 as needed for sleep 30 tablet 5  . amLODipine  (NORVASC) 5 MG tablet TAKE ONE TABLET BY MOUTH DAILY 90 tablet 1  . atorvastatin (LIPITOR) 40 MG tablet TAKE ONE TABLET BY MOUTH DAILY FOR CHOLESTEROL 90 tablet 3  . fenofibrate 160 MG tablet Take 1 tablet (160 mg total) by mouth daily. For cholesterol and triglyceride 90 tablet 3  . indomethacin (INDOCIN) 50 MG capsule Take 1 capsule (50 mg total) by mouth 3 (three) times daily as needed (for gout flare-ups). 45 capsule 1   Current Facility-Administered Medications on File Prior to Visit  Medication Dose Route Frequency Provider Last Rate Last Admin  . sodium chloride 0.9 % injection 10 mL  10 mL Intravenous PRN Ladell Pier, MD   10 mL at 11/14/14 1030    ROS Review of Systems  Constitutional: Negative for fever.  Respiratory: Negative for shortness of breath.   Cardiovascular: Negative for chest pain.  Musculoskeletal: Negative for arthralgias.  Skin: Negative for rash.    Objective:  BP 131/77   Pulse 61   Temp 98.1 F (36.7 C)   Ht 6' 1"  (1.854 m)   Wt 246 lb 9.6 oz (111.9 kg)   SpO2 99%   BMI 32.53 kg/m   BP Readings from Last 3 Encounters:  12/02/20 131/77  11/04/20 (!) 141/90  08/05/20 (!) 158/96    Wt Readings from Last 3 Encounters:  12/02/20 246 lb 9.6 oz (111.9 kg)  11/04/20 247 lb 12.8 oz (112.4 kg)  08/05/20 253 lb (114.8 kg)     Physical Exam Vitals reviewed.  Constitutional:      Appearance: He is well-developed.  HENT:     Head: Normocephalic and  atraumatic.     Right Ear: External ear normal.     Left Ear: External ear normal.     Mouth/Throat:     Pharynx: No oropharyngeal exudate or posterior oropharyngeal erythema.  Eyes:     Pupils: Pupils are equal, round, and reactive to light.  Cardiovascular:     Rate and Rhythm: Normal rate and regular rhythm.     Heart sounds: No murmur heard.   Pulmonary:     Effort: No respiratory distress.     Breath sounds: Normal breath sounds.  Musculoskeletal:     Cervical back: Normal range of  motion and neck supple.  Neurological:     Mental Status: He is alert and oriented to person, place, and time.       Assessment & Plan:   Austin Robbins was seen today for hypertension.  Diagnoses and all orders for this visit:  Ureterostomy status [facility]) -     Ambulatory referral to Urology -     CMP14+EGFR  Primary hypertension -     Ambulatory referral to Urology -     CMP14+EGFR   Allergies as of 12/02/2020      Reactions   Codeine Other (See Comments)   Chest pain      Medication List       Accurate as of December 02, 2020  4:01 PM. If you have any questions, ask your nurse or doctor.        ALPRAZolam 1 MG tablet Commonly known as: XANAX 1/2 - 1 as needed for sleep   amLODipine 5 MG tablet Commonly known as: NORVASC TAKE ONE TABLET BY MOUTH DAILY   atorvastatin 40 MG tablet Commonly known as: LIPITOR TAKE ONE TABLET BY MOUTH DAILY FOR CHOLESTEROL   fenofibrate 160 MG tablet Take 1 tablet (160 mg total) by mouth daily. For cholesterol and triglyceride   indomethacin 50 MG capsule Commonly known as: INDOCIN Take 1 capsule (50 mg total) by mouth 3 (three) times daily as needed (for gout flare-ups).        Follow-up: Return in about 3 months (around 03/04/2021) for cholesterol, fasting.  Claretta Fraise, M.D.

## 2020-12-03 LAB — CMP14+EGFR
ALT: 21 IU/L (ref 0–44)
AST: 22 IU/L (ref 0–40)
Albumin/Globulin Ratio: 1.5 (ref 1.2–2.2)
Albumin: 4.3 g/dL (ref 3.8–4.8)
Alkaline Phosphatase: 73 IU/L (ref 44–121)
BUN/Creatinine Ratio: 11 (ref 10–24)
BUN: 10 mg/dL (ref 8–27)
Bilirubin Total: 0.4 mg/dL (ref 0.0–1.2)
CO2: 23 mmol/L (ref 20–29)
Calcium: 9.4 mg/dL (ref 8.6–10.2)
Chloride: 105 mmol/L (ref 96–106)
Creatinine, Ser: 0.95 mg/dL (ref 0.76–1.27)
Globulin, Total: 2.8 g/dL (ref 1.5–4.5)
Glucose: 76 mg/dL (ref 65–99)
Potassium: 4.7 mmol/L (ref 3.5–5.2)
Sodium: 144 mmol/L (ref 134–144)
Total Protein: 7.1 g/dL (ref 6.0–8.5)
eGFR: 89 mL/min/{1.73_m2} (ref >59)

## 2020-12-04 NOTE — Progress Notes (Signed)
Hello Dia,  Your lab result is normal and/or stable.Some minor variations that are not significant are commonly marked abnormal, but do not represent any medical problem for you.  Best regards, Tobechukwu Emmick, M.D.

## 2021-01-28 ENCOUNTER — Encounter: Payer: Self-pay | Admitting: Oncology

## 2021-01-31 ENCOUNTER — Other Ambulatory Visit: Payer: Self-pay | Admitting: Family Medicine

## 2021-01-31 DIAGNOSIS — C20 Malignant neoplasm of rectum: Secondary | ICD-10-CM

## 2021-01-31 DIAGNOSIS — I10 Essential (primary) hypertension: Secondary | ICD-10-CM

## 2021-02-03 ENCOUNTER — Encounter: Payer: Self-pay | Admitting: Oncology

## 2021-02-04 ENCOUNTER — Inpatient Hospital Stay: Payer: 59

## 2021-02-04 ENCOUNTER — Other Ambulatory Visit: Payer: 59

## 2021-02-04 ENCOUNTER — Inpatient Hospital Stay: Payer: 59 | Attending: Oncology | Admitting: Oncology

## 2021-02-04 ENCOUNTER — Other Ambulatory Visit: Payer: Self-pay

## 2021-02-04 ENCOUNTER — Ambulatory Visit: Payer: 59 | Admitting: Oncology

## 2021-02-04 ENCOUNTER — Telehealth: Payer: Self-pay

## 2021-02-04 VITALS — BP 133/91 | HR 56 | Temp 98.0°F | Resp 18 | Ht 73.0 in | Wt 246.0 lb

## 2021-02-04 DIAGNOSIS — C7972 Secondary malignant neoplasm of left adrenal gland: Secondary | ICD-10-CM | POA: Insufficient documentation

## 2021-02-04 DIAGNOSIS — C20 Malignant neoplasm of rectum: Secondary | ICD-10-CM | POA: Insufficient documentation

## 2021-02-04 LAB — CEA (IN HOUSE-CHCC): CEA (CHCC-In House): 2.2 ng/mL (ref 0.00–5.00)

## 2021-02-04 LAB — CEA (ACCESS): CEA (CHCC): 3.85 ng/mL (ref 0.00–5.00)

## 2021-02-04 NOTE — Progress Notes (Signed)
[facility] OFFICE PROGRESS NOTE   Diagnosis: Rectal cancer  INTERVAL HISTORY:   Austin Robbins returns as scheduled.  Good appetite.  No difficulty with bowel or bladder function.  His wife died of a sudden MI in 2022/10/14. Objective:  Vital signs in last 24 hours:  Blood pressure (!) 133/91, pulse (!) 56, temperature 98 F (36.7 C), temperature source Oral, resp. rate 18, height 6' 1"  (1.854 m), weight 246 lb (111.6 kg), SpO2 100 %.    Lymphatics: No cervical, supraclavicular, axillary, or inguinal nodes Resp: Lungs clear bilaterally Cardio: Regular rate and rhythm GI: Left lower quadrant urostomy, right lower quadrant colostomy Vascular: Trace lower pretibial edema bilaterally     Lab Results:  Lab Results  Component Value Date   WBC 8.2 11/04/2020   HGB 16.5 11/04/2020   HCT 49.4 11/04/2020   MCV 91 11/04/2020   PLT 207 11/04/2020   NEUTROABS 5.0 11/04/2020    CMP  Lab Results  Component Value Date   NA 144 12/02/2020   K 4.7 12/02/2020   CL 105 12/02/2020   CO2 23 12/02/2020   GLUCOSE 76 12/02/2020   BUN 10 12/02/2020   CREATININE 0.95 12/02/2020   CALCIUM 9.4 12/02/2020   PROT 7.1 12/02/2020   ALBUMIN 4.3 12/02/2020   AST 22 12/02/2020   ALT 21 12/02/2020   ALKPHOS 73 12/02/2020   BILITOT 0.4 12/02/2020   GFRNONAA 91 05/02/2020   GFRAA 105 05/02/2020    Lab Results  Component Value Date   CEA1 2.26 08/05/2020    Medications: I have reviewed the patient's current medications.   Assessment/Plan:  1.  Clinical stage IV (L7L,G9Q,J1) adenocarcinoma of the rectum with tumor directly extending to the bladder/prostate and CT scan evidence of metastatic chest adenopathy  Positive K-ras mutation.   Normal mismatch repair protein expression. Microsatellite stable   Status post low anterior resection and end colostomy with a cystoprostatectomy and colon conduit urinary diversion 11/01/2013.   Staging PET scan with hypermetabolic  pelvic nodes, mediastinal nodes, left adrenal metastasis, and left upper lobe nodule.   Initiation of FOLFOX 12/25/2013.   Restaging CT 03/16/2014 after 6 cycles of FOLFOX confirmed improvement in chest/pelvic lymphadenopathy, a left upper lobe nodule, and resolution of a left adrenal nodule   Oxaliplatin deleted from cycle 8 FOLFOX 04/02/2014 secondary to neuropathy and thrombocytopenia.   Oxaliplatin held with cycle 9 FOLFOX 04/16/2014 due to neuropathy.   Cycle 10 FOLFOX 04/30/2014.   Cycle 11 FOLFOX 05/15/2014.   Cycle 12 FOLFOX 05/28/2014. Oxaliplatin held due to increased neuropathy.   Restaging CT evaluation 06/08/2014 with stable mediastinal and hilar adenopathy. Stable borderline left iliac lymph node. No new or progressive disease identified within the chest, abdomen or pelvis.   "Maintenance" 5-fluorouracil beginning 06/11/2014.   Restaging CT evaluation 10/10/2014 showed similar mild mediastinal and right hilar adenopathy. No visible recurrence of the prior left upper lobe metastatic lesion or the left adrenal metastatic lesion. No new lesions noted.   Maintenance Xeloda on a 7 day on/7 day off schedule beginning 10/25/2014   Restaging CTs 03/04/2015 with stable mediastinal lymph nodes and no evidence of metastatic disease   Xeloda continued   Xeloda dose reduced beginning 04/15/2015 due to progressive hand-foot syndrome.   Xeloda placed on hold 05/15/2015 due to continued hand/foot pain.   Xeloda resumed 06/10/2015.   Restaging CTs 08/09/2015 with no evidence of disease progression   Continuation of maintenance Xeloda  CTs 08/03/2016-no evidence of metastatic disease, no  pathologic chest lymphadenopathy  Continuation of maintenance Xeloda  CTs 08/09/2017-no evidence for metastatic disease in the chest, abdomen or pelvis. No evidence for local recurrence in the pelvis.  Xeloda discontinued12/01/2017  Colonoscopy 11/30/2019- multiple polyps  removed from the colon, tubular adenomas-no high-grade dysplasia 2. Microcytic anemia-likely iron deficiency, improved.  3. Gout. 4. Port-A-Cath placement 12/18/2013.  5. Anxiety. He takes Xanax as needed. 6. History of left knee pain and swelling. 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 Trial of Cymbalta initiated 07/09/2014. Trial of amitriptyline initiated 12/11/2015. 8. History of Thrombocytopenia secondary to chemotherapy. 9. Skin rash 04/30/2014-resolved with doxycycline 10. Chronic edema left lower leg/ankle. Negative Doppler 09/17/2014 11. Hand-foot syndrome secondary to Xeloda. Improved. 12. Enlargement of the left testicle 08/13/2015. He has been evaluated by urology. 13. Hypertension.Norvasc prescribed 05/13/2016. Resumed 10/28/2016. Reports not taking 12/02/2016 due to normal blood pressure outside of the office.Reports taking consistently 02/24/2017. 14. Right leg edema 04/07/2017. Doppler negative for DVT. 15. Left flank pain/hematuria 12/20/2019-CT with increased left hydronephrosis and ureterectasis to the level of the ileal conduit anastomosis, treated with antibiotics      Disposition: Austin Robbins remains in clinical remission from rectal cancer.  We will follow up on the CEA from today.  He will return for an office visit and CEA in 6 months.  He continues colonoscopy surveillance with Dr. Carlean Purl.  [name], MD  02/04/2021  8:03 AM

## 2021-02-04 NOTE — Telephone Encounter (Signed)
Called no answer message left concerning most recent CEA encouraged to return call for any questions concerns or change

## 2021-02-04 NOTE — Telephone Encounter (Signed)
-----   Message from Ladell Pier, MD sent at 02/04/2021  1:26 PM EDT ----- Please call patient, CEA is normal, follow-up as scheduled

## 2021-02-25 ENCOUNTER — Encounter: Payer: Self-pay | Admitting: Oncology

## 2021-02-28 ENCOUNTER — Encounter: Payer: Self-pay | Admitting: Oncology

## 2021-02-28 ENCOUNTER — Other Ambulatory Visit: Payer: Self-pay | Admitting: Family Medicine

## 2021-02-28 ENCOUNTER — Ambulatory Visit (INDEPENDENT_AMBULATORY_CARE_PROVIDER_SITE_OTHER): Payer: Medicare Other | Admitting: Family Medicine

## 2021-02-28 ENCOUNTER — Encounter: Payer: Self-pay | Admitting: Family Medicine

## 2021-02-28 ENCOUNTER — Other Ambulatory Visit: Payer: Self-pay

## 2021-02-28 VITALS — BP 127/71 | HR 62 | Temp 98.1°F | Ht 73.0 in | Wt 247.4 lb

## 2021-02-28 DIAGNOSIS — T451X5A Adverse effect of antineoplastic and immunosuppressive drugs, initial encounter: Secondary | ICD-10-CM | POA: Diagnosis not present

## 2021-02-28 DIAGNOSIS — F410 Panic disorder [episodic paroxysmal anxiety] without agoraphobia: Secondary | ICD-10-CM

## 2021-02-28 DIAGNOSIS — G620 Drug-induced polyneuropathy: Secondary | ICD-10-CM | POA: Diagnosis not present

## 2021-02-28 DIAGNOSIS — E782 Mixed hyperlipidemia: Secondary | ICD-10-CM

## 2021-02-28 DIAGNOSIS — C20 Malignant neoplasm of rectum: Secondary | ICD-10-CM

## 2021-02-28 DIAGNOSIS — I10 Essential (primary) hypertension: Secondary | ICD-10-CM | POA: Diagnosis not present

## 2021-03-01 LAB — CBC WITH DIFFERENTIAL/PLATELET
Basophils Absolute: 0 10*3/uL (ref 0.0–0.2)
Basos: 0 %
EOS (ABSOLUTE): 0.2 10*3/uL (ref 0.0–0.4)
Eos: 3 %
Hematocrit: 43.2 % (ref 37.5–51.0)
Hemoglobin: 15.2 g/dL (ref 13.0–17.7)
Immature Grans (Abs): 0 10*3/uL (ref 0.0–0.1)
Immature Granulocytes: 0 %
Lymphocytes Absolute: 2.3 10*3/uL (ref 0.7–3.1)
Lymphs: 34 %
MCH: 31.3 pg (ref 26.6–33.0)
MCHC: 35.2 g/dL (ref 31.5–35.7)
MCV: 89 fL (ref 79–97)
Monocytes Absolute: 0.7 10*3/uL (ref 0.1–0.9)
Monocytes: 10 %
Neutrophils Absolute: 3.5 10*3/uL (ref 1.4–7.0)
Neutrophils: 53 %
Platelets: 240 10*3/uL (ref 150–450)
RBC: 4.85 x10E6/uL (ref 4.14–5.80)
RDW: 13 % (ref 11.6–15.4)
WBC: 6.7 10*3/uL (ref 3.4–10.8)

## 2021-03-01 LAB — CMP14+EGFR
ALT: 25 IU/L (ref 0–44)
AST: 18 IU/L (ref 0–40)
Albumin/Globulin Ratio: 2 (ref 1.2–2.2)
Albumin: 4.4 g/dL (ref 3.8–4.8)
Alkaline Phosphatase: 63 IU/L (ref 44–121)
BUN/Creatinine Ratio: 11 (ref 10–24)
BUN: 11 mg/dL (ref 8–27)
Bilirubin Total: 0.5 mg/dL (ref 0.0–1.2)
CO2: 23 mmol/L (ref 20–29)
Calcium: 9.6 mg/dL (ref 8.6–10.2)
Chloride: 106 mmol/L (ref 96–106)
Creatinine, Ser: 0.96 mg/dL (ref 0.76–1.27)
Globulin, Total: 2.2 g/dL (ref 1.5–4.5)
Glucose: 107 mg/dL — ABNORMAL HIGH (ref 65–99)
Potassium: 4.3 mmol/L (ref 3.5–5.2)
Sodium: 144 mmol/L (ref 134–144)
Total Protein: 6.6 g/dL (ref 6.0–8.5)
eGFR: 88 mL/min/{1.73_m2} (ref >59)

## 2021-03-01 LAB — LIPID PANEL
Chol/HDL Ratio: 4.2 ratio (ref 0.0–5.0)
Cholesterol, Total: 137 mg/dL (ref 100–199)
HDL: 33 mg/dL — ABNORMAL LOW (ref >39)
LDL Chol Calc (NIH): 72 mg/dL (ref 0–99)
Triglycerides: 187 mg/dL — ABNORMAL HIGH (ref 0–149)
VLDL Cholesterol Cal: 32 mg/dL (ref 5–40)

## 2021-03-03 ENCOUNTER — Encounter: Payer: Self-pay | Admitting: Family Medicine

## 2021-03-03 NOTE — Progress Notes (Signed)
Subjective:  Patient ID: Austin Robbins, male    DOB: 07-Jul-1956  Age: 65 y.o. MRN: [id number]  CC: Medical Management of Chronic Issues   HPI JONH MCQUEARY presents for  presents for  follow-up of hypertension. Patient has no history of headache chest pain or shortness of breath or recent cough. Patient also denies symptoms of TIA such as focal numbness or weakness. Patient denies side effects from medication. States taking it regularly.  in for follow-up of elevated cholesterol. Doing well without complaints on current medication. Denies side effects of statin including myalgia and arthralgia and nausea. Currently no chest pain, shortness of breath or other cardiovascular related symptoms noted.  Patient has had recent episodes of panic.  He does get relief with the alprazolam.  He takes about a half usually for sleep sometimes a whole pill.  Take a half to a whole if he has a anxiety/panic attack. GAD 7 : Generalized Anxiety Score 02/28/2021 12/02/2020  Nervous, Anxious, on Edge 0 0  Control/stop worrying 1 1  Worry too much - different things 1 1  Trouble relaxing 0 0  Restless 0 0  Easily annoyed or irritable 0 0  Afraid - awful might happen 0 0  Total GAD 7 Score 2 2  Anxiety Difficulty Not difficult at all Not difficult at all    Depression screen [facility] 2/9 02/28/2021 02/28/2021 12/02/2020  Decreased Interest 1 0 1  Down, Depressed, Hopeless 0 0 0  PHQ - 2 Score 1 0 1  Altered sleeping 1 - 1  Tired, decreased energy 1 - 0  Change in appetite 0 - 0  Feeling bad or failure about yourself  0 - 0  Trouble concentrating 0 - 1  Moving slowly or fidgety/restless 0 - 0  Suicidal thoughts 0 - 0  PHQ-9 Score 3 - 3  Difficult doing work/chores Not difficult at all - Not difficult at all  Some recent data might be hidden    History Zannie has a past medical history of Adenocarcinoma of rectum ([place]) (10/25/2013), Allergy, Anemia, Colon cancer ([place]) (11/03/13), Gastritis, H. pylori  infection (08/07/2013), Hyperlipidemia, Hypertension (05/13/2016), Panic attacks, Personal history of colonic polyps - adenoma (10/25/2013), and S/P colostomy ([place]).   He has a past surgical history that includes Tonsillectomy; Low anterior bowel resection (11/04/2013); Cystectomy w/ ureterosigmoidostomy (11/04/2013); Bowel resection (N/A, 11/03/2013); Application if wound vac (N/A, 11/03/2013); Cystoscopy with ureteroscopy and stent placement (Bilateral, 11/03/2013); Portacath placement (Left, 12/18/2013); Port-a-cath removal (N/A, 10/07/2017); and Colonoscopy (10/25/2013).   His family history includes COPD in his mother; Healthy in his mother; Lung cancer in his father and mother.He reports that he has never smoked. He has never used smokeless tobacco. He reports current alcohol use of about 12.0 standard drinks of alcohol per week. He reports that he does not use drugs.    ROS Review of Systems  Constitutional:  Negative for fever.  Respiratory:  Negative for shortness of breath.   Cardiovascular:  Negative for chest pain.  Musculoskeletal:  Negative for arthralgias.  Skin:  Negative for rash.  Neurological:  Positive for numbness (paresthesia at RLE).   Objective:  BP 127/71   Pulse 62   Temp 98.1 F (36.7 C)   Ht 6' 1"  (1.854 m)   Wt 247 lb 6.4 oz (112.2 kg)   SpO2 97%   BMI 32.64 kg/m   BP Readings from Last 3 Encounters:  02/28/21 127/71  02/04/21 (!) 133/91  12/02/20 131/77  Wt Readings from Last 3 Encounters:  02/28/21 247 lb 6.4 oz (112.2 kg)  02/04/21 246 lb (111.6 kg)  12/02/20 246 lb 9.6 oz (111.9 kg)     Physical Exam Vitals reviewed.  Constitutional:      General: He is not in acute distress.    Appearance: He is well-developed.  HENT:     Head: Normocephalic and atraumatic.     Right Ear: External ear normal.     Left Ear: External ear normal.     Nose: Nose normal.     Mouth/Throat:     Pharynx: No oropharyngeal exudate or posterior oropharyngeal  erythema.  Eyes:     Conjunctiva/sclera: Conjunctivae normal.     Pupils: Pupils are equal, round, and reactive to light.  Cardiovascular:     Rate and Rhythm: Normal rate and regular rhythm.     Heart sounds: Normal heart sounds. No murmur heard. Pulmonary:     Effort: Pulmonary effort is normal. No respiratory distress.     Breath sounds: Normal breath sounds. No wheezing or rales.  Abdominal:     Palpations: Abdomen is soft.     Tenderness: There is no abdominal tenderness.  Musculoskeletal:        General: Normal range of motion.     Cervical back: Normal range of motion and neck supple.  Skin:    General: Skin is warm and dry.     Findings: No lesion.  Neurological:     Mental Status: He is alert and oriented to person, place, and time.     Deep Tendon Reflexes: Reflexes are normal and symmetric.  Psychiatric:        Behavior: Behavior normal.        Thought Content: Thought content normal.        Judgment: Judgment normal.      Assessment & Plan:   Vince was seen today for medical management of chronic issues.  Diagnoses and all orders for this visit:  Primary hypertension -     CBC with Differential/Platelet -     CMP14+EGFR -     Lipid panel  Panic attacks  Chemotherapy-induced neuropathy (HCC)  Mixed hyperlipidemia -     CBC with Differential/Platelet -     CMP14+EGFR -     Lipid panel      I am having Taquan P. Bolz maintain his atorvastatin, ALPRAZolam, fenofibrate, and amLODipine.  Allergies as of 02/28/2021       Reactions   Codeine Other (See Comments)   Chest pain        Medication List        Accurate as of February 28, 2021 11:59 PM. If you have any questions, ask your nurse or doctor.          ALPRAZolam 1 MG tablet Commonly known as: XANAX 1/2 - 1 as needed for sleep   amLODipine 5 MG tablet Commonly known as: NORVASC TAKE ONE TABLET BY MOUTH DAILY   atorvastatin 40 MG tablet Commonly known as: LIPITOR TAKE ONE  TABLET BY MOUTH DAILY FOR CHOLESTEROL   fenofibrate 160 MG tablet Take 1 tablet (160 mg total) by mouth daily. For cholesterol and triglyceride   indomethacin 50 MG capsule Commonly known as: INDOCIN Take 1 capsule (50 mg total) by mouth 3 (three) times daily as needed (for gout flare-ups).         Follow-up: Return in about 6 months (around 08/30/2021).  Claretta Fraise, M.D.

## 2021-03-03 NOTE — Progress Notes (Signed)
Hello Austin Robbins,  Your lab result is normal and/or stable.Some minor variations that are not significant are commonly marked abnormal, but do not represent any medical problem for you.  Best regards, Avianna Moynahan, M.D.

## 2021-03-04 ENCOUNTER — Ambulatory Visit: Payer: 59 | Admitting: Family Medicine

## 2021-03-04 DIAGNOSIS — N1330 Unspecified hydronephrosis: Secondary | ICD-10-CM | POA: Diagnosis not present

## 2021-03-04 DIAGNOSIS — I700 Atherosclerosis of aorta: Secondary | ICD-10-CM | POA: Diagnosis not present

## 2021-03-04 DIAGNOSIS — K760 Fatty (change of) liver, not elsewhere classified: Secondary | ICD-10-CM | POA: Diagnosis not present

## 2021-03-05 DIAGNOSIS — Z933 Colostomy status: Secondary | ICD-10-CM | POA: Diagnosis not present

## 2021-04-08 DIAGNOSIS — Z933 Colostomy status: Secondary | ICD-10-CM | POA: Diagnosis not present

## 2021-05-23 DIAGNOSIS — Z933 Colostomy status: Secondary | ICD-10-CM | POA: Diagnosis not present

## 2021-06-02 ENCOUNTER — Other Ambulatory Visit: Payer: Self-pay | Admitting: Family Medicine

## 2021-06-02 DIAGNOSIS — F410 Panic disorder [episodic paroxysmal anxiety] without agoraphobia: Secondary | ICD-10-CM

## 2021-06-02 DIAGNOSIS — G4701 Insomnia due to medical condition: Secondary | ICD-10-CM

## 2021-07-01 DIAGNOSIS — Z933 Colostomy status: Secondary | ICD-10-CM | POA: Diagnosis not present

## 2021-07-28 ENCOUNTER — Other Ambulatory Visit: Payer: Self-pay | Admitting: Family Medicine

## 2021-07-28 DIAGNOSIS — C20 Malignant neoplasm of rectum: Secondary | ICD-10-CM

## 2021-07-28 DIAGNOSIS — I10 Essential (primary) hypertension: Secondary | ICD-10-CM

## 2021-07-30 ENCOUNTER — Encounter: Payer: Self-pay | Admitting: Oncology

## 2021-08-01 ENCOUNTER — Other Ambulatory Visit: Payer: Self-pay | Admitting: Family Medicine

## 2021-08-01 DIAGNOSIS — E782 Mixed hyperlipidemia: Secondary | ICD-10-CM

## 2021-08-04 ENCOUNTER — Other Ambulatory Visit: Payer: Self-pay | Admitting: Family Medicine

## 2021-08-04 DIAGNOSIS — G4701 Insomnia due to medical condition: Secondary | ICD-10-CM

## 2021-08-04 DIAGNOSIS — Z933 Colostomy status: Secondary | ICD-10-CM | POA: Diagnosis not present

## 2021-08-04 DIAGNOSIS — F410 Panic disorder [episodic paroxysmal anxiety] without agoraphobia: Secondary | ICD-10-CM

## 2021-08-05 ENCOUNTER — Encounter: Payer: Self-pay | Admitting: Oncology

## 2021-08-06 ENCOUNTER — Inpatient Hospital Stay: Payer: Medicare Other | Attending: Oncology

## 2021-08-06 ENCOUNTER — Other Ambulatory Visit: Payer: Self-pay

## 2021-08-06 ENCOUNTER — Inpatient Hospital Stay: Payer: Medicare Other

## 2021-08-06 ENCOUNTER — Encounter: Payer: Self-pay | Admitting: Nurse Practitioner

## 2021-08-06 ENCOUNTER — Inpatient Hospital Stay: Payer: Medicare Other | Admitting: Nurse Practitioner

## 2021-08-06 VITALS — BP 135/79 | HR 53 | Temp 97.8°F | Resp 20 | Ht 73.0 in | Wt 242.2 lb

## 2021-08-06 DIAGNOSIS — Z23 Encounter for immunization: Secondary | ICD-10-CM | POA: Insufficient documentation

## 2021-08-06 DIAGNOSIS — C20 Malignant neoplasm of rectum: Secondary | ICD-10-CM | POA: Insufficient documentation

## 2021-08-06 LAB — CEA (ACCESS): CEA (CHCC): 2.93 ng/mL (ref 0.00–5.00)

## 2021-08-06 MED ORDER — INFLUENZA VAC A&B SA ADJ QUAD 0.5 ML IM PRSY
0.5000 mL | PREFILLED_SYRINGE | Freq: Once | INTRAMUSCULAR | Status: AC
  Administered 2021-08-06: 0.5 mL via INTRAMUSCULAR
  Filled 2021-08-06: qty 0.5

## 2021-08-06 NOTE — Progress Notes (Signed)
[place] OFFICE PROGRESS NOTE   Diagnosis: Rectal cancer  INTERVAL HISTORY:   Austin Robbins returns as scheduled.  He feels well.  He denies pain.  He has a good appetite.  No issues with bowel or bladder function.  Objective:  Vital signs in last 24 hours:  Blood pressure 135/79, pulse (!) 53, temperature 97.8 F (36.6 C), temperature source Oral, resp. rate 20, height 6' 1"  (1.854 m), weight 242 lb 3.2 oz (109.9 kg), SpO2 99 %.    HEENT: Neck without mass. Lymphatics: No palpable cervical, supraclavicular, axillary or inguinal lymph nodes. Resp: Lungs clear bilaterally. Cardio: Regular rate and rhythm. GI: No hepatomegaly.  Left lower quadrant urostomy.  Right lower quadrant colostomy. Vascular: Trace lower pretibial/ankle edema bilaterally.  Lab Results:  Lab Results  Component Value Date   WBC 6.7 02/28/2021   HGB 15.2 02/28/2021   HCT 43.2 02/28/2021   MCV 89 02/28/2021   PLT 240 02/28/2021   NEUTROABS 3.5 02/28/2021    Imaging:  No results found.  Medications: I have reviewed the patient's current medications.  Assessment/Plan:  Clinical stage IV (D4Y,C1K,G8) adenocarcinoma of the rectum with tumor directly extending to the bladder/prostate and CT scan evidence of metastatic chest adenopathy Positive K-ras mutation.   Normal mismatch repair protein expression. Microsatellite stable   Status post low anterior resection and end colostomy with a cystoprostatectomy and colon conduit urinary diversion 11/01/2013.   Staging PET scan with hypermetabolic pelvic nodes, mediastinal nodes, left adrenal metastasis, and left upper lobe nodule.   Initiation of FOLFOX 12/25/2013.   Restaging CT 03/16/2014 after 6 cycles of FOLFOX confirmed improvement in chest/pelvic lymphadenopathy, a left upper lobe nodule, and resolution of a left adrenal nodule   Oxaliplatin deleted from cycle 8 FOLFOX 04/02/2014 secondary to neuropathy and thrombocytopenia.    Oxaliplatin held with cycle 9 FOLFOX 04/16/2014 due to neuropathy.   Cycle 10 FOLFOX 04/30/2014.   Cycle 11 FOLFOX 05/15/2014.   Cycle 12 FOLFOX 05/28/2014. Oxaliplatin held due to increased neuropathy.   Restaging CT evaluation 06/08/2014 with stable mediastinal and hilar adenopathy. Stable borderline left iliac lymph node. No new or progressive disease identified within the chest, abdomen or pelvis.   "Maintenance" 5-fluorouracil beginning 06/11/2014.   Restaging CT evaluation 10/10/2014 showed similar mild mediastinal and right hilar adenopathy. No visible recurrence of the prior left upper lobe metastatic lesion or the left adrenal metastatic lesion. No new lesions noted.   Maintenance Xeloda on a 7 day on/7 day off schedule beginning 10/25/2014   Restaging CTs 03/04/2015 with stable mediastinal lymph nodes and no evidence of metastatic disease   Xeloda continued   Xeloda dose reduced beginning 04/15/2015 due to progressive hand-foot syndrome.   Xeloda placed on hold 05/15/2015 due to continued hand/foot pain.   Xeloda resumed 06/10/2015.   Restaging CTs 08/09/2015 with no evidence of disease progression   Continuation of maintenance Xeloda CTs 08/03/2016-no evidence of metastatic disease, no pathologic chest lymphadenopathy Continuation of maintenance Xeloda CTs 08/09/2017-no evidence for metastatic disease in the chest, abdomen or pelvis.  No evidence for local recurrence in the pelvis. Xeloda discontinued 08/11/2017 Colonoscopy 11/30/2019- multiple polyps removed from the colon, tubular adenomas-no high-grade dysplasia Microcytic anemia-likely iron deficiency, improved.   Gout. Port-A-Cath placement 12/18/2013.   Anxiety. He takes Xanax as needed. History of left knee pain and swelling. He was treated with a Medrol Dosepak 03/05/2014 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 Trial of Cymbalta initiated 07/09/2014. Trial of amitriptyline initiated  12/11/2015. History of  Thrombocytopenia secondary to chemotherapy. Skin rash 04/30/2014-resolved with doxycycline Chronic edema left lower leg/ankle. Negative Doppler 09/17/2014 Hand-foot syndrome secondary to Xeloda. Improved. Enlargement of the left testicle 08/13/2015. He has been evaluated by urology. Hypertension. Norvasc prescribed 05/13/2016. Resumed 10/28/2016. Reports not taking 12/02/2016 due to normal blood pressure outside of the office. Reports taking consistently 02/24/2017. Right leg edema 04/07/2017. Doppler negative for DVT. Left flank pain/hematuria 12/20/2019-CT with increased left hydronephrosis and ureterectasis to the level of the ileal conduit anastomosis, treated with antibiotics      Disposition: [name] remains in clinical remission from rectal cancer.  We will follow-up on the CEA from today.  He will return for a CEA and follow-up visit in 6 months.  He will receive the influenza vaccine today.  His wife died unexpectedly earlier this year.  I offered him a referral to the psychologist at our location.  He declines this at present.  He understands to contact us if he changes his mind.    [name] ANP/GNP-BC   08/06/2021  8:09 AM

## 2021-08-06 NOTE — Progress Notes (Signed)
Patient tolerated flu vaccine with no complaints voiced.  Site clean and dry with no bruising or swelling noted at site.  Band aid applied.  VSS during treatment and with discharge.  Pt left ambulatory with no s/s of distress noted.  

## 2021-08-07 ENCOUNTER — Telehealth: Payer: Self-pay

## 2021-08-07 NOTE — Telephone Encounter (Signed)
Patient verbalized understand  his cea is normal, and to follow-up as scheduled

## 2021-08-07 NOTE — Telephone Encounter (Signed)
-----   Message from Owens Shark, NP sent at 08/06/2021  4:05 PM EST ----- Please let him know the CEA is stable in normal range.  Follow-up as scheduled.

## 2021-09-02 ENCOUNTER — Encounter: Payer: Self-pay | Admitting: Family Medicine

## 2021-09-02 ENCOUNTER — Encounter: Payer: Self-pay | Admitting: Oncology

## 2021-09-02 ENCOUNTER — Ambulatory Visit (INDEPENDENT_AMBULATORY_CARE_PROVIDER_SITE_OTHER): Payer: Medicare Other | Admitting: Family Medicine

## 2021-09-02 VITALS — BP 122/73 | HR 55 | Temp 98.1°F | Ht 73.0 in | Wt 241.0 lb

## 2021-09-02 DIAGNOSIS — E559 Vitamin D deficiency, unspecified: Secondary | ICD-10-CM | POA: Diagnosis not present

## 2021-09-02 DIAGNOSIS — I10 Essential (primary) hypertension: Secondary | ICD-10-CM | POA: Diagnosis not present

## 2021-09-02 DIAGNOSIS — E782 Mixed hyperlipidemia: Secondary | ICD-10-CM

## 2021-09-02 DIAGNOSIS — C20 Malignant neoplasm of rectum: Secondary | ICD-10-CM

## 2021-09-02 DIAGNOSIS — F410 Panic disorder [episodic paroxysmal anxiety] without agoraphobia: Secondary | ICD-10-CM

## 2021-09-02 DIAGNOSIS — G4701 Insomnia due to medical condition: Secondary | ICD-10-CM

## 2021-09-02 DIAGNOSIS — M109 Gout, unspecified: Secondary | ICD-10-CM | POA: Diagnosis not present

## 2021-09-02 DIAGNOSIS — Z125 Encounter for screening for malignant neoplasm of prostate: Secondary | ICD-10-CM | POA: Diagnosis not present

## 2021-09-02 MED ORDER — ATORVASTATIN CALCIUM 40 MG PO TABS: 40.0000 mg | ORAL_TABLET | Freq: Every day | ORAL | 2 refills | Status: DC

## 2021-09-02 MED ORDER — FENOFIBRATE 160 MG PO TABS: 160.0000 mg | ORAL_TABLET | Freq: Every day | ORAL | 2 refills | Status: DC

## 2021-09-02 MED ORDER — ALPRAZOLAM 1 MG PO TABS: ORAL_TABLET | ORAL | 2 refills | Status: DC

## 2021-09-02 MED ORDER — AMLODIPINE BESYLATE 5 MG PO TABS: 5.0000 mg | ORAL_TABLET | Freq: Every day | ORAL | 2 refills | Status: DC

## 2021-09-02 NOTE — Progress Notes (Signed)
Subjective:  Patient ID: Austin Robbins, male    DOB: [birth year]  Age: 65 y.o. MRN: [id number]  CC: Medical Management of Chronic Issues   HPI Austin Robbins presents for  follow-up of hypertension. Patient has no history of headache chest pain or shortness of breath or recent cough. Patient also denies symptoms of TIA such as focal numbness or weakness. Patient denies side effects from medication. States taking it regularly.  Hx of colorectal Ca. Has colostomy. Had a gout flare at thanksgiving. Only lasted a day. Taking xanax at bedtime. Can't sleep otherwise.    History Austin Robbins has a past medical history of Adenocarcinoma of rectum ([place]) (10/25/2013), Allergy, Anemia, Colon cancer ([place]) (11/03/13), Gastritis, H. pylori infection (08/07/2013), Hyperlipidemia, Hypertension (05/13/2016), Panic attacks, Personal history of colonic polyps - adenoma (10/25/2013), and S/P colostomy ([place]).   He has a past surgical history that includes Tonsillectomy; Low anterior bowel resection (11/04/2013); Cystectomy w/ ureterosigmoidostomy (11/04/2013); Bowel resection (N/A, 11/03/2013); Application if wound vac (N/A, 11/03/2013); Cystoscopy with ureteroscopy and stent placement (Bilateral, 11/03/2013); Portacath placement (Left, 12/18/2013); Port-a-cath removal (N/A, 10/07/2017); and Colonoscopy (10/25/2013).   His family history includes COPD in his mother; Healthy in his mother; Lung cancer in his father and mother.He reports that he has never smoked. He has never used smokeless tobacco. He reports current alcohol use of about 12.0 standard drinks per week. He reports that he does not use drugs.  Current Outpatient Medications on File Prior to Visit  Medication Sig Dispense Refill   Coenzyme Q10 (CO Q10 PO) Take 1 tablet by mouth daily at 6 (six) AM.     indomethacin (INDOCIN) 50 MG capsule Take 1 capsule (50 mg total) by mouth 3 (three) times daily as needed (for gout flare-ups). 45 capsule 1   Current  Facility-Administered Medications on File Prior to Visit  Medication Dose Route Frequency Provider Last Rate Last Admin   sodium chloride 0.9 % injection 10 mL  10 mL Intravenous PRN Ladell Pier, MD   10 mL at 11/14/14 1030    ROS Review of Systems  Constitutional:  Negative for fever.  Respiratory:  Negative for shortness of breath.   Cardiovascular:  Negative for chest pain.  Musculoskeletal:  Negative for arthralgias.  Skin:  Negative for rash.   Objective:  BP 122/73    Pulse (!) 55    Temp 98.1 F (36.7 C)    Ht _0  (1.854 m)    Wt 241 lb (109.3 kg)    SpO2 97%    BMI 31.80 kg/m   BP Readings from Last 3 Encounters:  09/02/21 122/73  08/06/21 135/79  02/28/21 127/71    Wt Readings from Last 3 Encounters:  09/02/21 241 lb (109.3 kg)  08/06/21 242 lb 3.2 oz (109.9 kg)  02/28/21 247 lb 6.4 oz (112.2 kg)     Physical Exam Vitals reviewed.  Constitutional:      Appearance: He is well-developed.  HENT:     Head: Normocephalic and atraumatic.     Right Ear: External ear normal.     Left Ear: External ear normal.     Mouth/Throat:     Pharynx: No oropharyngeal exudate or posterior oropharyngeal erythema.  Eyes:     Pupils: Pupils are equal, round, and reactive to light.  Cardiovascular:     Rate and Rhythm: Normal rate and regular rhythm.     Heart sounds: No murmur heard. Pulmonary:     Effort: No respiratory distress.  Breath sounds: Normal breath sounds.  Musculoskeletal:     Cervical back: Normal range of motion and neck supple.  Neurological:     Mental Status: He is alert and oriented to person, place, and time.      Assessment & Plan:   Austin Robbins was seen today for medical management of chronic issues.  Diagnoses and all orders for this visit:  Primary hypertension -     CBC with Differential/Platelet -     CMP14+EGFR  Mixed hyperlipidemia -     Lipid panel -     atorvastatin (LIPITOR) 40 MG tablet; Take 1 tablet (40 mg total) by mouth  daily.  Screening for prostate cancer -     PSA, total and free  Vitamin D deficiency -     VITAMIN D 25 Hydroxy (Vit-D Deficiency, Fractures)  Insomnia due to medical condition -     ALPRAZolam (XANAX) 1 MG tablet; TAKE 1/2 TO 1 TABLET BY MOUTH AS NEEDED FOR SLEEP  Panic attacks -     ALPRAZolam (XANAX) 1 MG tablet; TAKE 1/2 TO 1 TABLET BY MOUTH AS NEEDED FOR SLEEP  Essential hypertension -     amLODipine (NORVASC) 5 MG tablet; Take 1 tablet (5 mg total) by mouth daily.  Rectal adenocarcinoma with perforation & invasion into bladder s/p LAR/cystectomy/colon conduit 11/04/2013 -     amLODipine (NORVASC) 5 MG tablet; Take 1 tablet (5 mg total) by mouth daily.  Acute gout of left foot, unspecified cause -     Uric acid  Other orders -     fenofibrate 160 MG tablet; Take 1 tablet (160 mg total) by mouth daily. For cholesterol and triglyceride   Allergies as of 09/02/2021       Reactions   Codeine Other (See Comments)   Chest pain        Medication List        Accurate as of September 02, 2021  9:21 AM. If you have any questions, ask your nurse or doctor.          ALPRAZolam 1 MG tablet Commonly known as: XANAX TAKE 1/2 TO 1 TABLET BY MOUTH AS NEEDED FOR SLEEP   amLODipine 5 MG tablet Commonly known as: NORVASC Take 1 tablet (5 mg total) by mouth daily.   atorvastatin 40 MG tablet Commonly known as: LIPITOR Take 1 tablet (40 mg total) by mouth daily. What changed: additional instructions Changed by: Claretta Fraise, MD   CO Q10 PO Take 1 tablet by mouth daily at 6 (six) AM.   fenofibrate 160 MG tablet Take 1 tablet (160 mg total) by mouth daily. For cholesterol and triglyceride   indomethacin 50 MG capsule Commonly known as: INDOCIN Take 1 capsule (50 mg total) by mouth 3 (three) times daily as needed (for gout flare-ups).        Meds ordered this encounter  Medications   ALPRAZolam (XANAX) 1 MG tablet    Sig: TAKE 1/2 TO 1 TABLET BY MOUTH AS  NEEDED FOR SLEEP    Dispense:  30 tablet    Refill:  2   amLODipine (NORVASC) 5 MG tablet    Sig: Take 1 tablet (5 mg total) by mouth daily.    Dispense:  90 tablet    Refill:  2   atorvastatin (LIPITOR) 40 MG tablet    Sig: Take 1 tablet (40 mg total) by mouth daily.    Dispense:  90 tablet    Refill:  2  This prescription was filled on 08/01/2021. Any refills authorized will be placed on file.   fenofibrate 160 MG tablet    Sig: Take 1 tablet (160 mg total) by mouth daily. For cholesterol and triglyceride    Dispense:  90 tablet    Refill:  2      Follow-up: Return in about 6 months (around 03/03/2022) for hypertension.  Claretta Fraise, M.D.

## 2021-09-03 ENCOUNTER — Other Ambulatory Visit: Payer: Self-pay

## 2021-09-03 LAB — CBC WITH DIFFERENTIAL/PLATELET
Basophils Absolute: 0 10*3/uL (ref 0.0–0.2)
Basos: 1 %
EOS (ABSOLUTE): 0.2 10*3/uL (ref 0.0–0.4)
Eos: 3 %
Hematocrit: 46.2 % (ref 37.5–51.0)
Hemoglobin: 16.2 g/dL (ref 13.0–17.7)
Immature Grans (Abs): 0 10*3/uL (ref 0.0–0.1)
Immature Granulocytes: 0 %
Lymphocytes Absolute: 1.8 10*3/uL (ref 0.7–3.1)
Lymphs: 29 %
MCH: 31.8 pg (ref 26.6–33.0)
MCHC: 35.1 g/dL (ref 31.5–35.7)
MCV: 91 fL (ref 79–97)
Monocytes Absolute: 0.6 10*3/uL (ref 0.1–0.9)
Monocytes: 9 %
Neutrophils Absolute: 3.6 10*3/uL (ref 1.4–7.0)
Neutrophils: 58 %
Platelets: 217 10*3/uL (ref 150–450)
RBC: 5.09 x10E6/uL (ref 4.14–5.80)
RDW: 12.1 % (ref 11.6–15.4)
WBC: 6.2 10*3/uL (ref 3.4–10.8)

## 2021-09-03 LAB — CMP14+EGFR
ALT: 31 IU/L (ref 0–44)
AST: 27 IU/L (ref 0–40)
Albumin/Globulin Ratio: 2 (ref 1.2–2.2)
Albumin: 4.5 g/dL (ref 3.8–4.8)
Alkaline Phosphatase: 63 IU/L (ref 44–121)
BUN/Creatinine Ratio: 15 (ref 10–24)
BUN: 15 mg/dL (ref 8–27)
Bilirubin Total: 0.7 mg/dL (ref 0.0–1.2)
CO2: 21 mmol/L (ref 20–29)
Calcium: 9.7 mg/dL (ref 8.6–10.2)
Chloride: 106 mmol/L (ref 96–106)
Creatinine, Ser: 0.99 mg/dL (ref 0.76–1.27)
Globulin, Total: 2.3 g/dL (ref 1.5–4.5)
Glucose: 109 mg/dL — ABNORMAL HIGH (ref 70–99)
Potassium: 4.3 mmol/L (ref 3.5–5.2)
Sodium: 142 mmol/L (ref 134–144)
Total Protein: 6.8 g/dL (ref 6.0–8.5)
eGFR: 85 mL/min/{1.73_m2} (ref >59)

## 2021-09-03 LAB — PSA, TOTAL AND FREE
PSA, Free: <0.02 ng/mL
Prostate Specific Ag, Serum: <0.1 ng/mL (ref 0.0–4.0)

## 2021-09-03 LAB — LIPID PANEL
Chol/HDL Ratio: 4.5 ratio (ref 0.0–5.0)
Cholesterol, Total: 138 mg/dL (ref 100–199)
HDL: 31 mg/dL — ABNORMAL LOW (ref >39)
LDL Chol Calc (NIH): 85 mg/dL (ref 0–99)
Triglycerides: 121 mg/dL (ref 0–149)
VLDL Cholesterol Cal: 22 mg/dL (ref 5–40)

## 2021-09-03 LAB — URIC ACID: Uric Acid: 5.9 mg/dL (ref 3.8–8.4)

## 2021-09-03 LAB — VITAMIN D 25 HYDROXY (VIT D DEFICIENCY, FRACTURES): Vit D, 25-Hydroxy: 20.1 ng/mL — ABNORMAL LOW (ref 30.0–100.0)

## 2021-09-03 MED ORDER — VITAMIN D (ERGOCALCIFEROL) 1.25 MG (50000 UNIT) PO CAPS: 50000.0000 [IU] | ORAL_CAPSULE | ORAL | 3 refills | Status: DC

## 2021-09-03 NOTE — Progress Notes (Signed)
Dear Yasseen P Seth, Your Vitamin D is  low. You need a prescription strength supplement I will send that in for you. Nurse, if at all possible, could you send in a prescription for the patient for vitamin D 50,000 units, 1 p.o. weekly #13 with 3 refills? Many thanks, WS

## 2021-09-19 ENCOUNTER — Telehealth: Payer: Self-pay | Admitting: Family Medicine

## 2021-09-19 ENCOUNTER — Other Ambulatory Visit: Payer: Self-pay | Admitting: Family Medicine

## 2021-09-19 DIAGNOSIS — C20 Malignant neoplasm of rectum: Secondary | ICD-10-CM

## 2021-09-19 DIAGNOSIS — Z936 Other artificial openings of urinary tract status: Secondary | ICD-10-CM

## 2021-09-19 NOTE — Telephone Encounter (Signed)
Pt needs a new rx for astami supplies. 48 pouch is not working good needs to changed to [phone]. Please fax to [facility] supplies in [place]--telephone number [phone] fax [phone]. Pt tried to call in a refill but was told since the product number was changed, a new order needed to be faxed.

## 2021-09-19 NOTE — Telephone Encounter (Signed)
LMOVM order faxed to [facility]

## 2021-09-22 DIAGNOSIS — Z933 Colostomy status: Secondary | ICD-10-CM | POA: Diagnosis not present

## 2021-11-06 DIAGNOSIS — Z933 Colostomy status: Secondary | ICD-10-CM | POA: Diagnosis not present

## 2021-12-16 DIAGNOSIS — Z933 Colostomy status: Secondary | ICD-10-CM | POA: Diagnosis not present

## 2021-12-17 DIAGNOSIS — Z933 Colostomy status: Secondary | ICD-10-CM | POA: Diagnosis not present

## 2022-01-26 ENCOUNTER — Ambulatory Visit: Payer: Medicare Other | Admitting: Oncology

## 2022-01-26 ENCOUNTER — Other Ambulatory Visit: Payer: Medicare Other

## 2022-01-26 ENCOUNTER — Other Ambulatory Visit: Payer: Self-pay | Admitting: Family Medicine

## 2022-01-26 DIAGNOSIS — G4701 Insomnia due to medical condition: Secondary | ICD-10-CM

## 2022-01-26 DIAGNOSIS — F410 Panic disorder [episodic paroxysmal anxiety] without agoraphobia: Secondary | ICD-10-CM

## 2022-02-11 DIAGNOSIS — Z933 Colostomy status: Secondary | ICD-10-CM | POA: Diagnosis not present

## 2022-02-12 ENCOUNTER — Encounter: Payer: Self-pay | Admitting: Oncology

## 2022-02-12 ENCOUNTER — Inpatient Hospital Stay: Payer: Medicare Other | Admitting: Oncology

## 2022-02-12 ENCOUNTER — Other Ambulatory Visit: Payer: Self-pay

## 2022-02-12 ENCOUNTER — Inpatient Hospital Stay: Payer: Medicare Other | Attending: Oncology

## 2022-02-12 VITALS — BP 137/79 | HR 52 | Temp 97.9°F | Resp 18 | Wt 241.0 lb

## 2022-02-12 DIAGNOSIS — L989 Disorder of the skin and subcutaneous tissue, unspecified: Secondary | ICD-10-CM | POA: Diagnosis not present

## 2022-02-12 DIAGNOSIS — C20 Malignant neoplasm of rectum: Secondary | ICD-10-CM | POA: Insufficient documentation

## 2022-02-12 LAB — CEA (ACCESS): CEA (CHCC): 3.25 ng/mL (ref 0.00–5.00)

## 2022-02-12 NOTE — Progress Notes (Signed)
[facility] OFFICE PROGRESS NOTE   Diagnosis: Rectal cancer  INTERVAL HISTORY:   Austin Robbins returns as scheduled.  He feels well.  He has noted a raised scalp lesion above the right ear for the past several months.  No other complaint.  Objective:  Vital signs in last 24 hours:  Blood pressure 137/79, pulse (!) 52, temperature 97.9 F (36.6 C), temperature source Tympanic, resp. rate 18, weight 241 lb (109.3 kg), SpO2 99 %.   Lymphatics: No cervical, supraclavicular, axillary, or inguinal nodes Resp: Lungs clear bilaterally Cardio: Regular rate and rhythm GI: No hepatosplenomegaly, left lower quadrant colostomy, right lower quadrant urostomy Vascular: No leg edema  Skin: 1.5 cm oval raised clear lesion at the right temporoparietal scalp, crusting at the lower aspect of the midline abdominal scar without nodularity  Lab Results:  Lab Results  Component Value Date   WBC 6.2 09/02/2021   HGB 16.2 09/02/2021   HCT 46.2 09/02/2021   MCV 91 09/02/2021   PLT 217 09/02/2021   NEUTROABS 3.6 09/02/2021    CMP  Lab Results  Component Value Date   NA 142 09/02/2021   K 4.3 09/02/2021   CL 106 09/02/2021   CO2 21 09/02/2021   GLUCOSE 109 (H) 09/02/2021   BUN 15 09/02/2021   CREATININE 0.99 09/02/2021   CALCIUM 9.7 09/02/2021   PROT 6.8 09/02/2021   ALBUMIN 4.5 09/02/2021   AST 27 09/02/2021   ALT 31 09/02/2021   ALKPHOS 63 09/02/2021   BILITOT 0.7 09/02/2021   GFRNONAA 91 05/02/2020   GFRAA 105 05/02/2020    Lab Results  Component Value Date   CEA1 2.20 02/04/2021   CEA 2.93 08/06/2021    Medications: I have reviewed the patient's current medications.   Assessment/Plan:  Clinical stage IV (I7T,I4P,Y0) adenocarcinoma of the rectum with tumor directly extending to the bladder/prostate and CT scan evidence of metastatic chest adenopathy Positive K-ras mutation.   Normal mismatch repair protein expression. Microsatellite stable   Status post low  anterior resection and end colostomy with a cystoprostatectomy and colon conduit urinary diversion 11/01/2013.   Staging PET scan with hypermetabolic pelvic nodes, mediastinal nodes, left adrenal metastasis, and left upper lobe nodule.   Initiation of FOLFOX 12/25/2013.   Restaging CT 03/16/2014 after 6 cycles of FOLFOX confirmed improvement in chest/pelvic lymphadenopathy, a left upper lobe nodule, and resolution of a left adrenal nodule   Oxaliplatin deleted from cycle 8 FOLFOX 04/02/2014 secondary to neuropathy and thrombocytopenia.   Oxaliplatin held with cycle 9 FOLFOX 04/16/2014 due to neuropathy.   Cycle 10 FOLFOX 04/30/2014.   Cycle 11 FOLFOX 05/15/2014.   Cycle 12 FOLFOX 05/28/2014. Oxaliplatin held due to increased neuropathy.   Restaging CT evaluation 06/08/2014 with stable mediastinal and hilar adenopathy. Stable borderline left iliac lymph node. No new or progressive disease identified within the chest, abdomen or pelvis.   "Maintenance" 5-fluorouracil beginning 06/11/2014.   Restaging CT evaluation 10/10/2014 showed similar mild mediastinal and right hilar adenopathy. No visible recurrence of the prior left upper lobe metastatic lesion or the left adrenal metastatic lesion. No new lesions noted.   Maintenance Xeloda on a 7 day on/7 day off schedule beginning 10/25/2014   Restaging CTs 03/04/2015 with stable mediastinal lymph nodes and no evidence of metastatic disease   Xeloda continued   Xeloda dose reduced beginning 04/15/2015 due to progressive hand-foot syndrome.   Xeloda placed on hold 05/15/2015 due to continued hand/foot pain.   Xeloda resumed 06/10/2015.  Restaging CTs 08/09/2015 with no evidence of disease progression   Continuation of maintenance Xeloda CTs 08/03/2016-no evidence of metastatic disease, no pathologic chest lymphadenopathy Continuation of maintenance Xeloda CTs 08/09/2017-no evidence for metastatic disease in the chest, abdomen or pelvis.  No evidence for  local recurrence in the pelvis. Xeloda discontinued 08/11/2017 Colonoscopy 11/30/2019- multiple polyps removed from the colon, tubular adenomas-no high-grade dysplasia Microcytic anemia-likely iron deficiency, improved.   Gout. Port-A-Cath placement 12/18/2013.   Anxiety. He takes Xanax as needed. History of left knee pain and swelling. He was treated with a Medrol Dosepak 03/05/2014 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 Trial of Cymbalta initiated 07/09/2014. Trial of amitriptyline initiated 12/11/2015. History of Thrombocytopenia secondary to chemotherapy. Skin rash 04/30/2014-resolved with doxycycline Chronic edema left lower leg/ankle. Negative Doppler 09/17/2014 Hand-foot syndrome secondary to Xeloda. Improved. Enlargement of the left testicle 08/13/2015. He has been evaluated by urology. Hypertension. Norvasc prescribed 05/13/2016. Resumed 10/28/2016. Reports not taking 12/02/2016 due to normal blood pressure outside of the office. Reports taking consistently 02/24/2017. Right leg edema 04/07/2017. Doppler negative for DVT. Left flank pain/hematuria 12/20/2019-CT with increased left hydronephrosis and ureterectasis to the level of the ileal conduit anastomosis, treated with antibiotics       Disposition: Austin Robbins remains in clinical remission from rectal cancer.  We will follow-up on the CEA from today.  He will return for an office visit and CEA in 6 months.  He will be due for a surveillance colonoscopy next year.  I will refer him to dermatology to evaluate the scalp lesion.  This may be a basal cell carcinoma versus a cyst.  I have a low suspicion for metastatic rectal cancer.  [name], MD  02/12/2022  8:31 AM

## 2022-02-13 ENCOUNTER — Telehealth: Payer: Self-pay

## 2022-02-13 NOTE — Telephone Encounter (Signed)
Patient gave verbal understanding and had no further questions or concerns  

## 2022-02-13 NOTE — Telephone Encounter (Signed)
-----   Message from Ladell Pier, MD sent at 02/12/2022  6:44 PM EDT ----- Please call patient, the CEA is normal, follow-up as scheduled

## 2022-02-16 ENCOUNTER — Telehealth: Payer: Self-pay

## 2022-02-16 ENCOUNTER — Other Ambulatory Visit: Payer: Self-pay

## 2022-02-16 DIAGNOSIS — C20 Malignant neoplasm of rectum: Secondary | ICD-10-CM

## 2022-02-16 NOTE — Telephone Encounter (Signed)
Juliann Pulse from [facility] called and left a message stated the new patient referral was decline due to they are is not taking new patient at this time. If we have any questions or concerns we can reach out to them.

## 2022-02-20 ENCOUNTER — Telehealth: Payer: Self-pay

## 2022-02-20 NOTE — Telephone Encounter (Signed)
Mailed out a referral to [place], [place] [id number]. I tried several time to faxed over and I was getting communication error. I spoke with Ivin Booty and stated they was having problem with their fax machine.

## 2022-03-04 ENCOUNTER — Encounter: Payer: Self-pay | Admitting: Family Medicine

## 2022-03-04 ENCOUNTER — Ambulatory Visit (INDEPENDENT_AMBULATORY_CARE_PROVIDER_SITE_OTHER): Payer: Medicare Other | Admitting: Family Medicine

## 2022-03-04 VITALS — BP 112/69 | HR 55 | Temp 97.8°F | Ht 73.0 in | Wt 231.8 lb

## 2022-03-04 DIAGNOSIS — D638 Anemia in other chronic diseases classified elsewhere: Secondary | ICD-10-CM

## 2022-03-04 DIAGNOSIS — G4701 Insomnia due to medical condition: Secondary | ICD-10-CM

## 2022-03-04 DIAGNOSIS — I10 Essential (primary) hypertension: Secondary | ICD-10-CM

## 2022-03-04 DIAGNOSIS — F410 Panic disorder [episodic paroxysmal anxiety] without agoraphobia: Secondary | ICD-10-CM | POA: Diagnosis not present

## 2022-03-04 DIAGNOSIS — E782 Mixed hyperlipidemia: Secondary | ICD-10-CM

## 2022-03-04 DIAGNOSIS — Z79899 Other long term (current) drug therapy: Secondary | ICD-10-CM | POA: Diagnosis not present

## 2022-03-04 DIAGNOSIS — Z933 Colostomy status: Secondary | ICD-10-CM | POA: Diagnosis not present

## 2022-03-04 NOTE — Progress Notes (Signed)
Subjective:  Patient ID: Austin Robbins, male    DOB: [birth year]  Age: 66 y.o. MRN: [id number]  CC: Medical Management of Chronic Issues and Hypertension   HPI Austin Robbins presents for difficulty sleeping. Xanax helps. Taking qhs since 2015. Panic less since he quit work. His mind gets on certain things and he worries. Xanax helps him to not overfocus on bad things. Used to have to take 3 a day.   Neuropathy impacts his toes. Not as bad as it used to be.  Indocin 3 pills over a day, stopped arecent gout flare in less than a day. This week was the first use of the indocin in several months.     presents for  follow-up of hypertension. Patient has no history of headache chest pain or shortness of breath or recent cough. Patient also denies symptoms of TIA such as focal numbness or weakness. Patient denies side effects from medication. States taking it regularly.      03/04/2022    8:09 AM 03/04/2022    8:04 AM 09/02/2021    9:05 AM  Depression screen PHQ 2/9  Decreased Interest 1 0 0  Down, Depressed, Hopeless 1 0 1  PHQ - 2 Score 2 0 1  Altered sleeping 1  1  Tired, decreased energy 1  1  Change in appetite 0  0  Feeling bad or failure about yourself  0  0  Trouble concentrating 1  0  Moving slowly or fidgety/restless 0  0  Suicidal thoughts 0  0  PHQ-9 Score 5  3  Difficult doing work/chores Not difficult at all  Not difficult at all    History Austin Robbins has a past medical history of Adenocarcinoma of rectum ([place]) (10/25/2013), Allergy, Anemia, Colon cancer ([place]) (11/03/13), Gastritis, H. pylori infection (08/07/2013), Hyperlipidemia, Hypertension (05/13/2016), Panic attacks, Personal history of colonic polyps - adenoma (10/25/2013), and S/P colostomy (Austin Robbins).   He has a past surgical history that includes Tonsillectomy; Low anterior bowel resection (11/04/2013); Cystectomy w/ ureterosigmoidostomy (11/04/2013); Bowel resection (N/A, 11/03/2013); Application if wound vac (N/A,  11/03/2013); Cystoscopy with ureteroscopy and stent placement (Bilateral, 11/03/2013); Portacath placement (Left, 12/18/2013); Port-a-cath removal (N/A, 10/07/2017); and Colonoscopy (10/25/2013).   His family history includes COPD in his mother; Healthy in his mother; Lung cancer in his father and mother.He reports that he has never smoked. He has never used smokeless tobacco. He reports current alcohol use of about 12.0 standard drinks of alcohol per week. He reports that he does not use drugs.    ROS Review of Systems  Objective:  BP 112/69   Pulse (!) 55   Temp 97.8 F (36.6 C)   Ht _0  (1.854 m)   Wt 231 lb 12.8 oz (105.1 kg)   SpO2 97%   BMI 30.58 kg/m   BP Readings from Last 3 Encounters:  03/04/22 112/69  02/12/22 137/79  09/02/21 122/73    Wt Readings from Last 3 Encounters:  03/04/22 231 lb 12.8 oz (105.1 kg)  02/12/22 241 lb (109.3 kg)  09/02/21 241 lb (109.3 kg)     Physical Exam    Assessment & Plan:   Austin Robbins was seen today for medical management of chronic issues and hypertension.  Diagnoses and all orders for this visit:  Anemia of chronic disease -     CMP14+EGFR -     CBC with Differential/Platelet  Insomnia due to medical condition -     Cancel: ToxASSURE Select 13 (MW), Urine  Panic attacks -     Cancel: ToxASSURE Select 13 (MW), Urine  Primary hypertension -     CMP14+EGFR -     CBC with Differential/Platelet  Mixed hyperlipidemia -     CMP14+EGFR -     CBC with Differential/Platelet -     Lipid panel  Controlled substance agreement signed -     ToxASSURE Select 13 (MW), Urine       I am having Austin Robbins maintain his indomethacin, Coenzyme Q10 (CO Q10 PO), amLODipine, atorvastatin, fenofibrate, Vitamin D (Ergocalciferol), and ALPRAZolam.  Allergies as of 03/04/2022       Reactions   Codeine Other (See Comments)   Chest pain        Medication List        Accurate as of March 04, 2022 11:59 PM. If you have any  questions, ask your nurse or doctor.          ALPRAZolam 1 MG tablet Commonly known as: XANAX TAKE 1/2 TO 1 TABLET BY MOUTH AS NEEDED FOR SLEEP   amLODipine 5 MG tablet Commonly known as: NORVASC Take 1 tablet (5 mg total) by mouth daily.   atorvastatin 40 MG tablet Commonly known as: LIPITOR Take 1 tablet (40 mg total) by mouth daily.   CO Q10 PO Take 1 tablet by mouth daily at 6 (six) AM.   fenofibrate 160 MG tablet Take 1 tablet (160 mg total) by mouth daily. For cholesterol and triglyceride   indomethacin 50 MG capsule Commonly known as: INDOCIN Take 1 capsule (50 mg total) by mouth 3 (three) times daily as needed (for gout flare-ups).   Vitamin D (Ergocalciferol) 1.25 MG (50000 UNIT) Caps capsule Commonly known as: DRISDOL Take 1 capsule (50,000 Units total) by mouth every 7 (seven) days.         Follow-up: Return in about 6 months (around 09/03/2022).  Claretta Fraise, M.D.

## 2022-03-05 LAB — CMP14+EGFR
ALT: 23 IU/L (ref 0–44)
AST: 21 IU/L (ref 0–40)
Albumin/Globulin Ratio: 1.9 (ref 1.2–2.2)
Albumin: 4.5 g/dL (ref 3.8–4.8)
Alkaline Phosphatase: 57 IU/L (ref 44–121)
BUN/Creatinine Ratio: 14 (ref 10–24)
BUN: 14 mg/dL (ref 8–27)
Bilirubin Total: 0.7 mg/dL (ref 0.0–1.2)
CO2: 23 mmol/L (ref 20–29)
Calcium: 9.6 mg/dL (ref 8.6–10.2)
Chloride: 107 mmol/L — ABNORMAL HIGH (ref 96–106)
Creatinine, Ser: 0.99 mg/dL (ref 0.76–1.27)
Globulin, Total: 2.4 g/dL (ref 1.5–4.5)
Glucose: 99 mg/dL (ref 70–99)
Potassium: 4.7 mmol/L (ref 3.5–5.2)
Sodium: 144 mmol/L (ref 134–144)
Total Protein: 6.9 g/dL (ref 6.0–8.5)
eGFR: 84 mL/min/{1.73_m2} (ref >59)

## 2022-03-05 LAB — CBC WITH DIFFERENTIAL/PLATELET
Basophils Absolute: 0 10*3/uL (ref 0.0–0.2)
Basos: 1 %
EOS (ABSOLUTE): 0.2 10*3/uL (ref 0.0–0.4)
Eos: 3 %
Hematocrit: 45.9 % (ref 37.5–51.0)
Hemoglobin: 15.8 g/dL (ref 13.0–17.7)
Immature Grans (Abs): 0 10*3/uL (ref 0.0–0.1)
Immature Granulocytes: 1 %
Lymphocytes Absolute: 1.8 10*3/uL (ref 0.7–3.1)
Lymphs: 30 %
MCH: 31.9 pg (ref 26.6–33.0)
MCHC: 34.4 g/dL (ref 31.5–35.7)
MCV: 93 fL (ref 79–97)
Monocytes Absolute: 0.5 10*3/uL (ref 0.1–0.9)
Monocytes: 9 %
Neutrophils Absolute: 3.6 10*3/uL (ref 1.4–7.0)
Neutrophils: 56 %
Platelets: 219 10*3/uL (ref 150–450)
RBC: 4.96 x10E6/uL (ref 4.14–5.80)
RDW: 12.7 % (ref 11.6–15.4)
WBC: 6.2 10*3/uL (ref 3.4–10.8)

## 2022-03-05 LAB — LIPID PANEL
Chol/HDL Ratio: 4.3 ratio (ref 0.0–5.0)
Cholesterol, Total: 142 mg/dL (ref 100–199)
HDL: 33 mg/dL — ABNORMAL LOW (ref >39)
LDL Chol Calc (NIH): 80 mg/dL (ref 0–99)
Triglycerides: 170 mg/dL — ABNORMAL HIGH (ref 0–149)
VLDL Cholesterol Cal: 29 mg/dL (ref 5–40)

## 2022-03-06 NOTE — Progress Notes (Signed)
Hello Hitesh,  Your lab result is normal and/or stable.Some minor variations that are not significant are commonly marked abnormal, but do not represent any medical problem for you.  Best regards, Jireh Vinas, M.D.

## 2022-03-09 LAB — TOXASSURE SELECT 13 (MW), URINE

## 2022-03-10 ENCOUNTER — Encounter: Payer: Self-pay | Admitting: Family Medicine

## 2022-03-12 ENCOUNTER — Other Ambulatory Visit: Payer: Self-pay | Admitting: Family Medicine

## 2022-03-12 DIAGNOSIS — F410 Panic disorder [episodic paroxysmal anxiety] without agoraphobia: Secondary | ICD-10-CM

## 2022-03-12 DIAGNOSIS — G4701 Insomnia due to medical condition: Secondary | ICD-10-CM

## 2022-03-12 DIAGNOSIS — C20 Malignant neoplasm of rectum: Secondary | ICD-10-CM

## 2022-03-24 ENCOUNTER — Encounter: Payer: Self-pay | Admitting: *Deleted

## 2022-03-24 DIAGNOSIS — Z933 Colostomy status: Secondary | ICD-10-CM | POA: Diagnosis not present

## 2022-03-24 NOTE — Progress Notes (Signed)
Left VM at # [phone] for [facility] referral coordinator to determine if he has appointment or has been seen. Referral sent on 02/16/22 to see Dr. Jarome Matin for scalp lesion Return call from [facility] that he is scheduled for 07/23/22 (1st available).

## 2022-04-27 DIAGNOSIS — Z933 Colostomy status: Secondary | ICD-10-CM | POA: Diagnosis not present

## 2022-05-02 ENCOUNTER — Other Ambulatory Visit: Payer: Self-pay | Admitting: Family Medicine

## 2022-05-02 DIAGNOSIS — E782 Mixed hyperlipidemia: Secondary | ICD-10-CM

## 2022-05-02 DIAGNOSIS — C20 Malignant neoplasm of rectum: Secondary | ICD-10-CM

## 2022-05-02 DIAGNOSIS — I10 Essential (primary) hypertension: Secondary | ICD-10-CM

## 2022-05-26 DIAGNOSIS — Z933 Colostomy status: Secondary | ICD-10-CM | POA: Diagnosis not present

## 2022-05-27 DIAGNOSIS — Z933 Colostomy status: Secondary | ICD-10-CM | POA: Diagnosis not present

## 2022-06-24 DIAGNOSIS — Z933 Colostomy status: Secondary | ICD-10-CM | POA: Diagnosis not present

## 2022-06-26 DIAGNOSIS — Z933 Colostomy status: Secondary | ICD-10-CM | POA: Diagnosis not present

## 2022-07-06 ENCOUNTER — Ambulatory Visit (INDEPENDENT_AMBULATORY_CARE_PROVIDER_SITE_OTHER): Payer: Medicare Other

## 2022-07-06 VITALS — Ht 73.0 in | Wt 240.0 lb

## 2022-07-06 DIAGNOSIS — Z0000 Encounter for general adult medical examination without abnormal findings: Secondary | ICD-10-CM | POA: Diagnosis not present

## 2022-07-06 NOTE — Patient Instructions (Signed)
[name] , Thank you for taking time to come for your Medicare Wellness Visit. I appreciate your ongoing commitment to your health goals. Please review the following plan we discussed and let me know if I can assist you in the future.   These are the goals we discussed:  Goals      Exercise 3x per week (30 min per time)        This is a list of the screening recommended for you and due dates:  Health Maintenance  Topic Date Due   Zoster (Shingles) Vaccine (1 of 2) Never done   COVID-19 Vaccine (4 - Pfizer risk series) 11/22/2020   Flu Shot  04/07/2022   Pneumonia Vaccine (1 - PCV) 09/02/2022*   Colon Cancer Screening  11/30/2022   Medicare Annual Wellness Visit  07/07/2023   Tetanus Vaccine  02/21/2030   Hepatitis C Screening: USPSTF Recommendation to screen - Ages 18-79 yo.  Completed   HPV Vaccine  Aged Out  *Topic was postponed. The date shown is not the original due date.    Advanced directives: Advance directive discussed with you today. I have provided a copy for you to complete at home and have notarized. Once this is complete please bring a copy in to our office so we can scan it into your chart.   Conditions/risks identified: Aim for 30 minutes of exercise or brisk walking, 6-8 glasses of water, and 5 servings of fruits and vegetables each day.   Next appointment: Follow up in one year for your annual wellness visit.   Preventive Care 96 Years and Older, Male  Preventive care refers to lifestyle choices and visits with your health care provider that can promote health and wellness. What does preventive care include? A yearly physical exam. This is also called an annual well check. Dental exams once or twice a year. Routine eye exams. Ask your health care provider how often you should have your eyes checked. Personal lifestyle choices, including: Daily care of your teeth and gums. Regular physical activity. Eating a healthy diet. Avoiding tobacco and drug  use. Limiting alcohol use. Practicing safe sex. Taking low doses of aspirin every day. Taking vitamin and mineral supplements as recommended by your health care provider. What happens during an annual well check? The services and screenings done by your health care provider during your annual well check will depend on your age, overall health, lifestyle risk factors, and family history of disease. Counseling  Your health care provider may ask you questions about your: Alcohol use. Tobacco use. Drug use. Emotional well-being. Home and relationship well-being. Sexual activity. Eating habits. History of falls. Memory and ability to understand (cognition). Work and work Statistician. Screening  You may have the following tests or measurements: Height, weight, and BMI. Blood pressure. Lipid and cholesterol levels. These may be checked every 5 years, or more frequently if you are over 79 years old. Skin check. Lung cancer screening. You may have this screening every year starting at age 53 if you have a 30-pack-year history of smoking and currently smoke or have quit within the past 15 years. Fecal occult blood test (FOBT) of the stool. You may have this test every year starting at age 57. Flexible sigmoidoscopy or colonoscopy. You may have a sigmoidoscopy every 5 years or a colonoscopy every 10 years starting at age 8. Prostate cancer screening. Recommendations will vary depending on your family history and other risks. Hepatitis C blood test. Hepatitis B blood test. Sexually  transmitted disease (STD) testing. Diabetes screening. This is done by checking your blood sugar (glucose) after you have not eaten for a while (fasting). You may have this done every 1-3 years. Abdominal aortic aneurysm (AAA) screening. You may need this if you are a current or former smoker. Osteoporosis. You may be screened starting at age 38 if you are at high risk. Talk with your health care provider about  your test results, treatment options, and if necessary, the need for more tests. Vaccines  Your health care provider may recommend certain vaccines, such as: Influenza vaccine. This is recommended every year. Tetanus, diphtheria, and acellular pertussis (Tdap, Td) vaccine. You may need a Td booster every 10 years. Zoster vaccine. You may need this after age 65. Pneumococcal 13-valent conjugate (PCV13) vaccine. One dose is recommended after age 21. Pneumococcal polysaccharide (PPSV23) vaccine. One dose is recommended after age 77. Talk to your health care provider about which screenings and vaccines you need and how often you need them. This information is not intended to replace advice given to you by your health care provider. Make sure you discuss any questions you have with your health care provider. Document Released: 09/20/2015 Document Revised: 05/13/2016 Document Reviewed: 06/25/2015 Elsevier Interactive Patient Education  2017 [place] Prevention in the Home Falls can cause injuries. They can happen to people of all ages. There are many things you can do to make your home safe and to help prevent falls. What can I do on the outside of my home? Regularly fix the edges of walkways and driveways and fix any cracks. Remove anything that might make you trip as you walk through a door, such as a raised step or threshold. Trim any bushes or trees on the path to your home. Use bright outdoor lighting. Clear any walking paths of anything that might make someone trip, such as rocks or tools. Regularly check to see if handrails are loose or broken. Make sure that both sides of any steps have handrails. Any raised decks and porches should have guardrails on the edges. Have any leaves, snow, or ice cleared regularly. Use sand or salt on walking paths during winter. Clean up any spills in your garage right away. This includes oil or grease spills. What can I do in the bathroom? Use  night lights. Install grab bars by the toilet and in the tub and shower. Do not use towel bars as grab bars. Use non-skid mats or decals in the tub or shower. If you need to sit down in the shower, use a plastic, non-slip stool. Keep the floor dry. Clean up any water that spills on the floor as soon as it happens. Remove soap buildup in the tub or shower regularly. Attach bath mats securely with double-sided non-slip rug tape. Do not have throw rugs and other things on the floor that can make you trip. What can I do in the bedroom? Use night lights. Make sure that you have a light by your bed that is easy to reach. Do not use any sheets or blankets that are too big for your bed. They should not hang down onto the floor. Have a firm chair that has side arms. You can use this for support while you get dressed. Do not have throw rugs and other things on the floor that can make you trip. What can I do in the kitchen? Clean up any spills right away. Avoid walking on wet floors. Keep items that you use  a lot in easy-to-reach places. If you need to reach something above you, use a strong step stool that has a grab bar. Keep electrical cords out of the way. Do not use floor polish or wax that makes floors slippery. If you must use wax, use non-skid floor wax. Do not have throw rugs and other things on the floor that can make you trip. What can I do with my stairs? Do not leave any items on the stairs. Make sure that there are handrails on both sides of the stairs and use them. Fix handrails that are broken or loose. Make sure that handrails are as long as the stairways. Check any carpeting to make sure that it is firmly attached to the stairs. Fix any carpet that is loose or worn. Avoid having throw rugs at the top or bottom of the stairs. If you do have throw rugs, attach them to the floor with carpet tape. Make sure that you have a light switch at the top of the stairs and the bottom of the  stairs. If you do not have them, ask someone to add them for you. What else can I do to help prevent falls? Wear shoes that: Do not have high heels. Have rubber bottoms. Are comfortable and fit you well. Are closed at the toe. Do not wear sandals. If you use a stepladder: Make sure that it is fully opened. Do not climb a closed stepladder. Make sure that both sides of the stepladder are locked into place. Ask someone to hold it for you, if possible. Clearly mark and make sure that you can see: Any grab bars or handrails. First and last steps. Where the edge of each step is. Use tools that help you move around (mobility aids) if they are needed. These include: Canes. Walkers. Scooters. Crutches. Turn on the lights when you go into a dark area. Replace any light bulbs as soon as they burn out. Set up your furniture so you have a clear path. Avoid moving your furniture around. If any of your floors are uneven, fix them. If there are any pets around you, be aware of where they are. Review your medicines with your doctor. Some medicines can make you feel dizzy. This can increase your chance of falling. Ask your doctor what other things that you can do to help prevent falls. This information is not intended to replace advice given to you by your health care provider. Make sure you discuss any questions you have with your health care provider. Document Released: 06/20/2009 Document Revised: 01/30/2016 Document Reviewed: 09/28/2014 Elsevier Interactive Patient Education  2017 [employer].

## 2022-07-06 NOTE — Progress Notes (Signed)
Subjective:   Austin Robbins is a 66 y.o. male who presents for an Initial Medicare Annual Wellness Visit.   I connected with  Tomasa Hosteller on 07/06/22 by a audio enabled telemedicine application and verified that I am speaking with the correct person using two identifiers.  Patient Location: Home  Provider Location: Home Office  I discussed the limitations of evaluation and management by telemedicine. The patient expressed understanding and agreed to proceed.  Review of Systems     Cardiac Risk Factors include: advanced age (>72mn, >>11women);male gender     Objective:    Today's Vitals   07/06/22 1326  Weight: 240 lb (108.9 kg)  Height: '6\' 1"'$  (1.854 m)   Body mass index is 31.66 kg/m.     07/06/2022    1:31 PM 02/12/2022    8:04 AM 08/06/2021    8:24 AM 02/02/2020    9:31 AM 12/18/2019    6:38 PM 08/29/2018    8:13 AM 05/04/2018    8:41 AM  Advanced Directives  Does Patient Have a Medical Advance Directive? No No No No No No No  Would patient like information on creating a medical advance directive? No - Patient declined No - Patient declined No - Patient declined No - Patient declined  No - Patient declined No - Patient declined    Current Medications (verified) Outpatient Encounter Medications as of 07/06/2022  Medication Sig   ALPRAZolam (XANAX) 1 MG tablet TAKE 1/2 TO 1 TABLET BY MOUTH AS NEEDED FOR SLEEP   amLODipine (NORVASC) 5 MG tablet Take 1 tablet (5 mg total) by mouth daily.   atorvastatin (LIPITOR) 40 MG tablet Take 1 tablet (40 mg total) by mouth daily.   Coenzyme Q10 (CO Q10 PO) Take 1 tablet by mouth daily at 6 (six) AM.   fenofibrate 160 MG tablet Take 1 tablet (160 mg total) by mouth daily. For cholesterol and triglyceride   indomethacin (INDOCIN) 50 MG capsule Take 1 capsule (50 mg total) by mouth 3 (three) times daily as needed (for gout flare-ups).   Vitamin D, Ergocalciferol, (DRISDOL) 1.25 MG (50000 UNIT) CAPS capsule Take 1 capsule (50,000  Units total) by mouth every 7 (seven) days. (Patient not taking: Reported on 07/06/2022)   Facility-Administered Encounter Medications as of 07/06/2022  Medication   sodium chloride 0.9 % injection 10 mL    Allergies (verified) Codeine   History: Past Medical History:  Diagnosis Date   Aden[place] of [place]um ([facility] 10/25/2013   10/25/2013 colonoscopy   Allergy    Anemia    Colon cancer ([facility] 11/03/13   Gastritis    H. pylori infection 08/07/2013   Hyperlipidemia    Hypertension 05/13/2016   Panic attacks    Personal history of colonic polyps - adenoma 10/25/2013   [place]    Past Surgical History:  Procedure Laterality Date   APPLICATION OF WOUND VAC N/A 11/03/2013   Procedure: APPLICATION OF WOUND VAC;  Surgeon: ALeighton Ruff MD;  Location: [facility];  Service: General;  Laterality: N/A;   BOWEL RESECTION N/A 11/03/2013   Procedure: OPEN EViviann Spare/Greig CastillaRESECTION/COLOSTOMY;  Surgeon: ALeighton Ruff MD;  Location: [facility];  Service: General;  Laterality: N/A;   COLONOSCOPY  10/25/2013   CYSTECTOMY W/ URETEROSIGMOIDOSTOMY  11/04/2013   CYSTOSCOPY WITH URETEROSCOPY AND STENT PLACEMENT Bilateral 11/03/2013   Procedure: CYSTOSCOPY WITH RIGHT RETROGRADE STENT PLACEMENT , bladder biopsy,TOTAL PROSTATE WITH ENBLOCK CYSTECTOMY AND PROSTATECTOMY/ COLON CONDUIT URINARY DIVERSION/ INSERTION BILATERAL STENTS/ ILEOSTOMY;  Surgeon: Alexis Frock, MD;  Location: [facility];  Service: Urology;  Laterality: Bilateral;  bladder biopsy   LOW ANTERIOR BOWEL RESECTION  11/04/2013   PORT-A-CATH REMOVAL N/A 10/07/2017   Procedure: REMOVAL PORT-A-CATH;  Surgeon: Leighton Ruff, MD;  Location: [facility];  Service: General;  Laterality: N/A;   PORTACATH PLACEMENT Left 12/18/2013   Procedure: INSERTION PORT-A-CATH;  Surgeon: Leighton Ruff, MD;  Location: [facility];  Service: General;  Laterality: Left;   TONSILLECTOMY     Family History  Problem Relation Age of Onset   Healthy Mother     COPD Mother    Lung cancer Mother    Lung cancer Father        smoker   Colon cancer Neg Hx    Colon polyps Neg Hx    Esophageal cancer Neg Hx    Rectal cancer Neg Hx    Stomach cancer Neg Hx    Social History   Socioeconomic History   Marital status: Married    Spouse name: Terri   Number of children: 2   Years of education: Not on file   Highest education level: Not on file  Occupational History   Occupation: Architect  Tobacco Use   Smoking status: Never   Smokeless tobacco: Never  Vaping Use   Vaping Use: Never used  Substance and Sexual Activity   Alcohol use: Yes    Alcohol/week: 12.0 standard drinks of alcohol    Types: 12 Cans of beer per week   Drug use: No   Sexual activity: Not on file  Other Topics Concern   Not on file  Social History Narrative   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 + children  Never smoker. 2 caffeine drinks per day. Prior regular fairly heavy alcohol until recently as of 10/06/2013.      Social Determinants of Health   Financial Resource Strain: Low Risk  (07/06/2022)   Overall Financial Resource Strain (CARDIA)    Difficulty of Paying Living Expenses: Not hard at all  Food Insecurity: No Food Insecurity (07/06/2022)   Hunger Vital Sign    Worried About Running Out of Food in the Last Year: Never true    Ran Out of Food in the Last Year: Never true  Transportation Needs: No Transportation Needs (07/06/2022)   PRAPARE - Hydrologist (Medical): No    Lack of Transportation (Non-Medical): No  Physical Activity: Sufficiently Active (07/06/2022)   Exercise Vital Sign    Days of Exercise per Week: 5 days    Minutes of Exercise per Session: 30 min  Stress: No Stress Concern Present (07/06/2022)   [place]    Feeling of Stress : Not at all  Social Connections: Moderately Integrated (07/06/2022)   Social Connection and  Isolation Panel [NHANES]    Frequency of Communication with Friends and Family: More than three times a week    Frequency of Social Gatherings with Friends and Family: More than three times a week    Attends Religious Services: More than 4 times per year    Active Member of [employer] or Organizations: Yes    Attends Archivist Meetings: More than 4 times per year    Marital Status: Widowed    Tobacco Counseling Counseling given: Not Answered   Clinical Intake:  Pre-visit preparation completed: Yes  Pain : No/denies pain     Nutritional Risks: None Diabetes: No  How  often do you need to have someone help you when you read instructions, pamphlets, or other written materials from your doctor or pharmacy?: 1 - Never  Diabetic?no    Interpreter Needed?: No  Information entered by :: Jadene Pierini, LPN   Activities of Daily Living    07/06/2022    1:31 PM  In your present state of health, do you have any difficulty performing the following activities:  Hearing? 0  Vision? 0  Difficulty concentrating or making decisions? 0  Walking or climbing stairs? 0  Dressing or bathing? 0  Doing errands, shopping? 0  Preparing Food and eating ? N  Using the Toilet? N  In the past six months, have you accidently leaked urine? N  Do you have problems with loss of bowel control? N  Managing your Medications? N  Managing your Finances? N  Housekeeping or managing your Housekeeping? N    Patient Care Team: Claretta Fraise, MD as PCP - General (Family Medicine) Alexis Frock, MD as Consulting Physician (Urology) Leighton Ruff, MD as Consulting Physician (General Surgery) Gatha Mayer, MD as Consulting Physician (Gastroenterology) Carola Frost, RN as Registered Nurse  Indicate any recent Medical Services you may have received from other than [facility] providers in the past year (date may be approximate).     Assessment:   This is a routine wellness examination for  [place].  Hearing/Vision screen Vision Screening - Comments:: Annual eye exams   Dietary issues and exercise activities discussed: Current Exercise Habits: Home exercise routine, Type of exercise: walking, Time (Minutes): 30, Frequency (Times/Week): 3, Weekly Exercise (Minutes/Week): 90, Intensity: Mild, Exercise limited by: None identified   Goals Addressed             This Visit's Progress    Exercise 3x per week (30 min per time)         Depression Screen    07/06/2022    1:30 PM 03/04/2022    8:09 AM 03/04/2022    8:04 AM 09/02/2021    9:05 AM 09/02/2021    8:56 AM 02/28/2021    4:12 PM 02/28/2021    4:02 PM  PHQ 2/9 Scores  PHQ - 2 Score 0 2 0 1 0 1 0  PHQ- 9 Score  '5  3  3     '$ Fall Risk    07/06/2022    1:28 PM 03/04/2022    8:04 AM 09/02/2021    8:56 AM 02/28/2021    4:02 PM 12/02/2020    3:32 PM  Fall Risk   Falls in the past year? 0 0 0 0 0  Number falls in past yr: 0      Injury with Fall? 0      Risk for fall due to : No Fall Risks      Follow up Falls prevention discussed        FALL RISK PREVENTION PERTAINING TO THE HOME:  Any stairs in or around the home? No  If so, are there any without handrails? No  Home free of loose throw rugs in walkways, pet beds, electrical cords, etc? Yes  Adequate lighting in your home to reduce risk of falls? Yes   ASSISTIVE DEVICES UTILIZED TO PREVENT FALLS:  Life alert? No  Use of a cane, walker or w/c? No  Grab bars in the bathroom? Yes  Shower chair or bench in shower? No  Elevated toilet seat or a handicapped toilet? No     07/06/2022  1:31 PM  6CIT Screen  What Year? 0 points  What month? 0 points  What time? 0 points  Count back from 20 0 points  Months in reverse 0 points  Repeat phrase 0 points  Total Score 0 points    Immunizations Immunization History  Administered Date(s) Administered   Fluad Quad(high Dose 65+) 08/06/2021   Influenza,inj,Quad PF,6+ Mos 06/25/2014, 08/13/2015, 08/05/2016,  06/30/2017, 08/29/2018, 06/05/2019, 08/05/2020   PFIZER(Purple Top)SARS-COV-2 Vaccination 02/29/2020, 03/21/2020, 09/27/2020   Tdap 02/22/2020    TDAP status: Up to date  Flu Vaccine status: Due, Education has been provided regarding the importance of this vaccine. Advised may receive this vaccine at local pharmacy or Health Dept. Aware to provide a copy of the vaccination record if obtained from local pharmacy or Health Dept. Verbalized acceptance and understanding.  Pneumococcal vaccine status: Up to date  Covid-19 vaccine status: Completed vaccines  Qualifies for Shingles Vaccine? Yes   Zostavax completed No   Shingrix Completed?: No.    Education has been provided regarding the importance of this vaccine. Patient has been advised to call insurance company to determine out of pocket expense if they have not yet received this vaccine. Advised may also receive vaccine at local pharmacy or Health Dept. Verbalized acceptance and understanding.  Screening Tests Health Maintenance  Topic Date Due   Zoster Vaccines- Shingrix (1 of 2) Never done   COVID-19 Vaccine (4 - Pfizer risk series) 11/22/2020   INFLUENZA VACCINE  04/07/2022   Pneumonia Vaccine 3+ Years old (1 - PCV) 09/02/2022 (Originally 02/22/2021)   COLONOSCOPY (Pts 45-40yr Insurance coverage will need to be confirmed)  11/30/2022   Medicare Annual Wellness (AWV)  07/07/2023   TETANUS/TDAP  02/21/2030   Hepatitis C Screening  Completed   HPV VACCINES  Aged Out    Health Maintenance  Health Maintenance Due  Topic Date Due   Zoster Vaccines- Shingrix (1 of 2) Never done   COVID-19 Vaccine (4 - Pfizer risk series) 11/22/2020   INFLUENZA VACCINE  04/07/2022    Colorectal cancer screening: Type of screening: Colonoscopy. Completed 11/30/2019. Repeat every 3 years  Lung Cancer Screening: (Low Dose CT Chest recommended if Age 66-80years, 30 pack-year currently smoking OR have quit w/in 15years.) does not qualify.   Lung  Cancer Screening Referral: n/a  Additional Screening:  Hepatitis C Screening: does not qualify;   Vision Screening: Recommended annual ophthalmology exams for early detection of glaucoma and other disorders of the eye. Is the patient up to date with their annual eye exam?  Yes  Who is the provider or what is the name of the office in which the patient attends annual eye exams? [facility]  If pt is not established with a provider, would they like to be referred to a provider to establish care? No .   Dental Screening: Recommended annual dental exams for proper oral hygiene  Community Resource Referral / Chronic Care Management: CRR required this visit?  No   CCM required this visit?  No      Plan:     I have personally reviewed and noted the following in the patient's chart:   Medical and social history Use of alcohol, tobacco or illicit drugs  Current medications and supplements including opioid prescriptions. Patient is not currently taking opioid prescriptions. Functional ability and status Nutritional status Physical activity Advanced directives List of other physicians Hospitalizations, surgeries, and ER visits in previous 12 months Vitals Screenings to include cognitive, depression, and falls Referrals and  appointments  In addition, I have reviewed and discussed with patient certain preventive protocols, quality metrics, and best practice recommendations. A written personalized care plan for preventive services as well as general preventive health recommendations were provided to patient.     Daphane Shepherd, LPN   06/00/4599   Nurse Notes: none

## 2022-07-28 DIAGNOSIS — Z8551 Personal history of malignant neoplasm of bladder: Secondary | ICD-10-CM | POA: Diagnosis not present

## 2022-07-28 DIAGNOSIS — Z85038 Personal history of other malignant neoplasm of large intestine: Secondary | ICD-10-CM | POA: Diagnosis not present

## 2022-07-28 DIAGNOSIS — Z936 Other artificial openings of urinary tract status: Secondary | ICD-10-CM | POA: Diagnosis not present

## 2022-07-28 DIAGNOSIS — Z933 Colostomy status: Secondary | ICD-10-CM | POA: Diagnosis not present

## 2022-08-06 ENCOUNTER — Other Ambulatory Visit: Payer: Self-pay | Admitting: Family Medicine

## 2022-08-06 DIAGNOSIS — C20 Malignant neoplasm of rectum: Secondary | ICD-10-CM

## 2022-08-14 ENCOUNTER — Encounter: Payer: Self-pay | Admitting: Nurse Practitioner

## 2022-08-14 ENCOUNTER — Inpatient Hospital Stay: Payer: Medicare Other | Admitting: Nurse Practitioner

## 2022-08-14 ENCOUNTER — Inpatient Hospital Stay: Payer: Medicare Other | Attending: Oncology

## 2022-08-14 VITALS — BP 139/80 | HR 55 | Temp 98.1°F | Resp 18 | Ht 73.0 in | Wt 240.0 lb

## 2022-08-14 DIAGNOSIS — C20 Malignant neoplasm of rectum: Secondary | ICD-10-CM | POA: Diagnosis not present

## 2022-08-14 DIAGNOSIS — L989 Disorder of the skin and subcutaneous tissue, unspecified: Secondary | ICD-10-CM | POA: Insufficient documentation

## 2022-08-14 DIAGNOSIS — C7972 Secondary malignant neoplasm of left adrenal gland: Secondary | ICD-10-CM | POA: Diagnosis not present

## 2022-08-14 LAB — CEA (ACCESS): CEA (CHCC): 3.47 ng/mL (ref 0.00–5.00)

## 2022-08-14 NOTE — Progress Notes (Signed)
[facility] OFFICE PROGRESS NOTE   Diagnosis: Rectal cancer  INTERVAL HISTORY:   Austin Robbins returns as scheduled.  He feels well.  No abdominal pain.  He has a good appetite.  The raised scalp lesion above the right ear is smaller.  He has not seen dermatology.  Objective:  Vital signs in last 24 hours:  Blood pressure 139/80, pulse (!) 55, temperature 98.1 F (36.7 C), temperature source Oral, resp. rate 18, height _0  (1.854 m), weight 240 lb (108.9 kg), SpO2 98 %.    Lymphatics: No palpable cervical, supraclavicular, axillary or inguinal lymph nodes. Resp: Lungs clear bilaterally. Cardio: Regular rate and rhythm. GI: No hepatosplenomegaly.  Left lower quadrant colostomy.  Right lower quadrant urostomy. Vascular: No leg edema. Skin: Less than 1 cm slightly raised dry appearing lesion at the right temporoparietal scalp.   Lab Results:  Lab Results  Component Value Date   WBC 6.2 03/04/2022   HGB 15.8 03/04/2022   HCT 45.9 03/04/2022   MCV 93 03/04/2022   PLT 219 03/04/2022   NEUTROABS 3.6 03/04/2022    Imaging:  No results found.  Medications: I have reviewed the patient's current medications.  Assessment/Plan:  Clinical stage IV (G2X,B2W,U1) adenocarcinoma of the rectum with tumor directly extending to the bladder/prostate and CT scan evidence of metastatic chest adenopathy Positive K-ras mutation.   Normal mismatch repair protein expression. Microsatellite stable   Status post low anterior resection and end colostomy with a cystoprostatectomy and colon conduit urinary diversion 11/01/2013.   Staging PET scan with hypermetabolic pelvic nodes, mediastinal nodes, left adrenal metastasis, and left upper lobe nodule.   Initiation of FOLFOX 12/25/2013.   Restaging CT 03/16/2014 after 6 cycles of FOLFOX confirmed improvement in chest/pelvic lymphadenopathy, a left upper lobe nodule, and resolution of a left adrenal nodule   Oxaliplatin deleted from  cycle 8 FOLFOX 04/02/2014 secondary to neuropathy and thrombocytopenia.   Oxaliplatin held with cycle 9 FOLFOX 04/16/2014 due to neuropathy.   Cycle 10 FOLFOX 04/30/2014.   Cycle 11 FOLFOX 05/15/2014.   Cycle 12 FOLFOX 05/28/2014. Oxaliplatin held due to increased neuropathy.   Restaging CT evaluation 06/08/2014 with stable mediastinal and hilar adenopathy. Stable borderline left iliac lymph node. No new or progressive disease identified within the chest, abdomen or pelvis.   "Maintenance" 5-fluorouracil beginning 06/11/2014.   Restaging CT evaluation 10/10/2014 showed similar mild mediastinal and right hilar adenopathy. No visible recurrence of the prior left upper lobe metastatic lesion or the left adrenal metastatic lesion. No new lesions noted.   Maintenance Xeloda on a 7 day on/7 day off schedule beginning 10/25/2014   Restaging CTs 03/04/2015 with stable mediastinal lymph nodes and no evidence of metastatic disease   Xeloda continued   Xeloda dose reduced beginning 04/15/2015 due to progressive hand-foot syndrome.   Xeloda placed on hold 05/15/2015 due to continued hand/foot pain.   Xeloda resumed 06/10/2015.   Restaging CTs 08/09/2015 with no evidence of disease progression   Continuation of maintenance Xeloda CTs 08/03/2016-no evidence of metastatic disease, no pathologic chest lymphadenopathy Continuation of maintenance Xeloda CTs 08/09/2017-no evidence for metastatic disease in the chest, abdomen or pelvis.  No evidence for local recurrence in the pelvis. Xeloda discontinued 08/11/2017 Colonoscopy 11/30/2019- multiple polyps removed from the colon, tubular adenomas-no high-grade dysplasia, next colonoscopy at a 3-year interval Microcytic anemia-likely iron deficiency, improved.   Gout. Port-A-Cath placement 12/18/2013.   Anxiety. He takes Xanax as needed. History of left knee pain and swelling. He was  treated with a Medrol Dosepak 03/05/2014 Oxaliplatin neuropathy. Persistent with  pain in the toes. Trial of Cymbalta initiated 07/09/2014. Trial of amitriptyline initiated 12/11/2015. History of Thrombocytopenia secondary to chemotherapy. Skin rash 04/30/2014-resolved with doxycycline Chronic edema left lower leg/ankle. Negative Doppler 09/17/2014 Hand-foot syndrome secondary to Xeloda. Improved. Enlargement of the left testicle 08/13/2015. He has been evaluated by urology. Hypertension. Norvasc prescribed 05/13/2016. Resumed 10/28/2016. Reports not taking 12/02/2016 due to normal blood pressure outside of the office. Reports taking consistently 02/24/2017. Right leg edema 04/07/2017. Doppler negative for DVT. Left flank pain/hematuria 12/20/2019-CT with increased left hydronephrosis and ureterectasis to the level of the ileal conduit anastomosis, treated with antibiotics      Disposition: Austin Robbins remains in clinical remission from rectal cancer.  CEA from today is stable in normal range.  Due for surveillance colonoscopy March 2024.  The scalp lesion is smaller.  He has not seen dermatology.  He will return for a CEA and follow-up visit in 6 months.    [name] ANP/GNP-BC   08/14/2022  8:17 AM

## 2022-08-26 DIAGNOSIS — Z85038 Personal history of other malignant neoplasm of large intestine: Secondary | ICD-10-CM | POA: Diagnosis not present

## 2022-08-26 DIAGNOSIS — Z8551 Personal history of malignant neoplasm of bladder: Secondary | ICD-10-CM | POA: Diagnosis not present

## 2022-08-26 DIAGNOSIS — Z936 Other artificial openings of urinary tract status: Secondary | ICD-10-CM | POA: Diagnosis not present

## 2022-08-26 DIAGNOSIS — Z933 Colostomy status: Secondary | ICD-10-CM | POA: Diagnosis not present

## 2022-09-09 ENCOUNTER — Encounter: Payer: Self-pay | Admitting: Family Medicine

## 2022-09-09 ENCOUNTER — Ambulatory Visit (INDEPENDENT_AMBULATORY_CARE_PROVIDER_SITE_OTHER): Payer: Medicare Other | Admitting: Family Medicine

## 2022-09-09 VITALS — BP 131/74 | HR 56 | Temp 98.0°F | Ht 73.0 in | Wt 247.8 lb

## 2022-09-09 DIAGNOSIS — I10 Essential (primary) hypertension: Secondary | ICD-10-CM | POA: Diagnosis not present

## 2022-09-09 DIAGNOSIS — Z85040 Personal history of malignant carcinoid tumor of rectum: Secondary | ICD-10-CM

## 2022-09-09 DIAGNOSIS — F410 Panic disorder [episodic paroxysmal anxiety] without agoraphobia: Secondary | ICD-10-CM

## 2022-09-09 DIAGNOSIS — E782 Mixed hyperlipidemia: Secondary | ICD-10-CM | POA: Diagnosis not present

## 2022-09-09 DIAGNOSIS — Z125 Encounter for screening for malignant neoplasm of prostate: Secondary | ICD-10-CM

## 2022-09-09 DIAGNOSIS — G4701 Insomnia due to medical condition: Secondary | ICD-10-CM

## 2022-09-09 DIAGNOSIS — E559 Vitamin D deficiency, unspecified: Secondary | ICD-10-CM

## 2022-09-09 DIAGNOSIS — C20 Malignant neoplasm of rectum: Secondary | ICD-10-CM

## 2022-09-09 LAB — LIPID PANEL

## 2022-09-09 MED ORDER — AMLODIPINE BESYLATE 5 MG PO TABS: 5.0000 mg | ORAL_TABLET | Freq: Every day | ORAL | 3 refills | Status: DC

## 2022-09-09 MED ORDER — ALPRAZOLAM 1 MG PO TABS: ORAL_TABLET | ORAL | 5 refills | Status: DC

## 2022-09-09 MED ORDER — VITAMIN D (ERGOCALCIFEROL) 1.25 MG (50000 UNIT) PO CAPS: 50000.0000 [IU] | ORAL_CAPSULE | ORAL | 3 refills | Status: DC

## 2022-09-09 MED ORDER — FENOFIBRATE 160 MG PO TABS: 160.0000 mg | ORAL_TABLET | Freq: Every day | ORAL | 3 refills | Status: DC

## 2022-09-09 MED ORDER — ATORVASTATIN CALCIUM 40 MG PO TABS: 40.0000 mg | ORAL_TABLET | Freq: Every day | ORAL | 3 refills | Status: DC

## 2022-09-09 NOTE — Progress Notes (Signed)
Subjective:  Patient ID: Austin Robbins, male    DOB: Oct 19, 1955  Age: 67 y.o. MRN: [id number]  CC: Medical Management of Chronic Issues   HPI CLOUD Austin Robbins presents for  presents for  follow-up of hypertension. Patient has no history of headache chest pain or shortness of breath or recent cough. Patient also denies symptoms of TIA such as focal numbness or weakness. Patient denies side effects from medication. States taking it regularly.   in for follow-up of elevated cholesterol. Doing well without complaints on current medication. Denies side effects of statin including myalgia and arthralgia and nausea. Currently no chest pain, shortness of breath or other cardiovascular related symptoms noted.  Grieving due to upcoming 1  yr anniversary this week of wife's death. Next week, 1 yr. Anniversary of mother's death.      09-20-2022    8:17 AM 07/06/2022    1:30 PM 03/04/2022    8:09 AM  Depression screen PHQ 2/9  Decreased Interest 1 0 1  Down, Depressed, Hopeless 0 0 1  PHQ - 2 Score 1 0 2  Altered sleeping 1  1  Tired, decreased energy 1  1  Change in appetite 1  0  Feeling bad or failure about yourself  1  0  Trouble concentrating 1  1  Moving slowly or fidgety/restless 0  0  Suicidal thoughts 0  0  PHQ-9 Score 6  5  Difficult doing work/chores Not difficult at all  Not difficult at all      Sep 20, 2022    8:17 AM 03/04/2022    8:10 AM 09/02/2021    9:05 AM 02/28/2021    4:12 PM  GAD 7 : Generalized Anxiety Score  Nervous, Anxious, on Edge 0 1 1 0  Control/stop worrying 1 1 0 1  Worry too much - different things 1 0 1 1  Trouble relaxing 0 1 0 0  Restless 0 0 0 0  Easily annoyed or irritable 0 0 0 0  Afraid - awful might happen 0 1 0 0  Total GAD 7 Score _0 Anxiety Difficulty Not difficult at all Not difficult at all Not difficult at all Not difficult at all    Using xanax since Ca of 2015. Can't sleep without it.   History Austin Robbins has a past medical history of  Adenocarcinoma of rectum ([place]) (10/25/2013), Allergy, Anemia, Colon cancer ([place]) (11/03/13), Gastritis, H. pylori infection (08/07/2013), Hyperlipidemia, Hypertension (05/13/2016), Panic attacks, Personal history of colonic polyps - adenoma (10/25/2013), and S/P colostomy ([place]).   He has a past surgical history that includes Tonsillectomy; Low anterior bowel resection (11/04/2013); Cystectomy w/ ureterosigmoidostomy (11/04/2013); Bowel resection (N/A, 11/03/2013); Application if wound vac (N/A, 11/03/2013); Cystoscopy with ureteroscopy and stent placement (Bilateral, 11/03/2013); Portacath placement (Left, 12/18/2013); Port-a-cath removal (N/A, 10/07/2017); and Colonoscopy (10/25/2013).   His family history includes COPD in his mother; Healthy in his mother; Lung cancer in his father and mother.He reports that he has never smoked. He has never used smokeless tobacco. He reports current alcohol use of about 12.0 standard drinks of alcohol per week. He reports that he does not use drugs.    ROS Review of Systems  Constitutional:  Negative for fever.  Respiratory:  Negative for shortness of breath.   Cardiovascular:  Negative for chest pain.  Musculoskeletal:  Negative for arthralgias.  Skin:  Negative for rash.    Objective:  BP 131/74   Pulse (!) 56   Temp 98  F (36.7 C)   Ht _0  (1.854 m)   Wt 247 lb 12.8 oz (112.4 kg)   SpO2 98%   BMI 32.69 kg/m   BP Readings from Last 3 Encounters:  09/09/22 131/74  08/14/22 139/80  03/04/22 112/69    Wt Readings from Last 3 Encounters:  09/09/22 247 lb 12.8 oz (112.4 kg)  08/14/22 240 lb (108.9 kg)  07/06/22 240 lb (108.9 kg)     Physical Exam Vitals reviewed.  Constitutional:      Appearance: He is well-developed.  HENT:     Head: Normocephalic and atraumatic.     Right Ear: External ear normal.     Left Ear: External ear normal.     Mouth/Throat:     Pharynx: No oropharyngeal exudate or posterior oropharyngeal erythema.  Eyes:      Pupils: Pupils are equal, round, and reactive to light.  Cardiovascular:     Rate and Rhythm: Normal rate and regular rhythm.     Heart sounds: No murmur heard. Pulmonary:     Effort: No respiratory distress.     Breath sounds: Normal breath sounds.  Abdominal:     Palpations: There is no mass.     Tenderness: There is no abdominal tenderness. There is no guarding.     Comments: Ostomies in place.   Musculoskeletal:     Cervical back: Normal range of motion and neck supple.  Neurological:     Mental Status: He is alert and oriented to person, place, and time.       Assessment & Plan:   Zaul was seen today for medical management of chronic issues.  Diagnoses and all orders for this visit:  Primary hypertension -     CBC with Differential/Platelet -     CMP14+EGFR  Mixed hyperlipidemia -     Lipid panel -     atorvastatin (LIPITOR) 40 MG tablet; Take 1 tablet (40 mg total) by mouth daily.  Vitamin D deficiency -     VITAMIN D 25 Hydroxy (Vit-D Deficiency, Fractures)  Screening for prostate cancer -     PSA, total and free  Panic attacks -     ALPRAZolam (XANAX) 1 MG tablet; TAKE 1/2 TO 1 TABLET BY MOUTH AS NEEDED FOR SLEEP  Insomnia due to medical condition -     ALPRAZolam (XANAX) 1 MG tablet; TAKE 1/2 TO 1 TABLET BY MOUTH AS NEEDED FOR SLEEP  Rectal adenocarcinoma with perforation & invasion into bladder s/p LAR/cystectomy/colon conduit 11/04/2013 -     amLODipine (NORVASC) 5 MG tablet; Take 1 tablet (5 mg total) by mouth daily.  Essential hypertension -     amLODipine (NORVASC) 5 MG tablet; Take 1 tablet (5 mg total) by mouth daily.  Other orders -     fenofibrate 160 MG tablet; Take 1 tablet (160 mg total) by mouth daily. For cholesterol and triglyceride -     Vitamin D, Ergocalciferol, (DRISDOL) 1.25 MG (50000 UNIT) CAPS capsule; Take 1 capsule (50,000 Units total) by mouth every 7 (seven) days.       I am having [name] maintain his Coenzyme  Q10 (CO Q10 PO), indomethacin, ALPRAZolam, amLODipine, atorvastatin, fenofibrate, and Vitamin D (Ergocalciferol).  Allergies as of 09/09/2022       Reactions   Codeine Other (See Comments)   Chest pain        Medication List        Accurate as of September 09, 2022  9:01 AM.  If you have any questions, ask your nurse or doctor.          ALPRAZolam 1 MG tablet Commonly known as: XANAX TAKE 1/2 TO 1 TABLET BY MOUTH AS NEEDED FOR SLEEP   amLODipine 5 MG tablet Commonly known as: NORVASC Take 1 tablet (5 mg total) by mouth daily.   atorvastatin 40 MG tablet Commonly known as: LIPITOR Take 1 tablet (40 mg total) by mouth daily.   CO Q10 PO Take 1 tablet by mouth daily at 6 (six) AM.   fenofibrate 160 MG tablet Take 1 tablet (160 mg total) by mouth daily. For cholesterol and triglyceride   indomethacin 50 MG capsule Commonly known as: INDOCIN Take 1 capsule (50 mg total) by mouth 3 (three) times daily as needed (for gout flare-ups).   Vitamin D (Ergocalciferol) 1.25 MG (50000 UNIT) Caps capsule Commonly known as: DRISDOL Take 1 capsule (50,000 Units total) by mouth every 7 (seven) days.         Follow-up: Return in about 6 months (around 03/10/2023).  Claretta Fraise, M.D.

## 2022-09-10 LAB — CBC WITH DIFFERENTIAL/PLATELET
Basophils Absolute: 0 10*3/uL (ref 0.0–0.2)
Basos: 1 %
EOS (ABSOLUTE): 0.2 10*3/uL (ref 0.0–0.4)
Eos: 4 %
Hematocrit: 45.6 % (ref 37.5–51.0)
Hemoglobin: 15.9 g/dL (ref 13.0–17.7)
Immature Grans (Abs): 0 10*3/uL (ref 0.0–0.1)
Immature Granulocytes: 0 %
Lymphocytes Absolute: 1.9 10*3/uL (ref 0.7–3.1)
Lymphs: 31 %
MCH: 31.7 pg (ref 26.6–33.0)
MCHC: 34.9 g/dL (ref 31.5–35.7)
MCV: 91 fL (ref 79–97)
Monocytes Absolute: 0.5 10*3/uL (ref 0.1–0.9)
Monocytes: 9 %
Neutrophils Absolute: 3.4 10*3/uL (ref 1.4–7.0)
Neutrophils: 55 %
Platelets: 191 10*3/uL (ref 150–450)
RBC: 5.02 x10E6/uL (ref 4.14–5.80)
RDW: 11.9 % (ref 11.6–15.4)
WBC: 6 10*3/uL (ref 3.4–10.8)

## 2022-09-10 LAB — CMP14+EGFR
ALT: 20 IU/L (ref 0–44)
AST: 19 IU/L (ref 0–40)
Albumin/Globulin Ratio: 1.9 (ref 1.2–2.2)
Albumin: 4.2 g/dL (ref 3.9–4.9)
Alkaline Phosphatase: 49 IU/L (ref 44–121)
BUN/Creatinine Ratio: 15 (ref 10–24)
BUN: 13 mg/dL (ref 8–27)
Bilirubin Total: 0.4 mg/dL (ref 0.0–1.2)
CO2: 23 mmol/L (ref 20–29)
Calcium: 9.3 mg/dL (ref 8.6–10.2)
Chloride: 105 mmol/L (ref 96–106)
Creatinine, Ser: 0.88 mg/dL (ref 0.76–1.27)
Globulin, Total: 2.2 g/dL (ref 1.5–4.5)
Glucose: 108 mg/dL — ABNORMAL HIGH (ref 70–99)
Potassium: 4.3 mmol/L (ref 3.5–5.2)
Sodium: 142 mmol/L (ref 134–144)
Total Protein: 6.4 g/dL (ref 6.0–8.5)
eGFR: 95 mL/min/{1.73_m2} (ref >59)

## 2022-09-10 LAB — LIPID PANEL
Chol/HDL Ratio: 3.6 ratio (ref 0.0–5.0)
Cholesterol, Total: 140 mg/dL (ref 100–199)
HDL: 39 mg/dL — ABNORMAL LOW (ref >39)
LDL Chol Calc (NIH): 76 mg/dL (ref 0–99)
Triglycerides: 141 mg/dL (ref 0–149)
VLDL Cholesterol Cal: 25 mg/dL (ref 5–40)

## 2022-09-10 LAB — VITAMIN D 25 HYDROXY (VIT D DEFICIENCY, FRACTURES): Vit D, 25-Hydroxy: 18.7 ng/mL — ABNORMAL LOW (ref 30.0–100.0)

## 2022-09-10 LAB — PSA, TOTAL AND FREE
PSA, Free: <0.02 ng/mL
Prostate Specific Ag, Serum: <0.1 ng/mL (ref 0.0–4.0)

## 2022-09-11 NOTE — Progress Notes (Signed)
Dear Tomasa Hosteller, Your Vitamin D is  low. You need a prescription strength supplement I will send that in for you. Nurse, if at all possible, could you send in a prescription for the patient for vitamin D 50,000 units, 1 p.o. weekly #13 with 3 refills? Many thanks, WS

## 2022-09-14 ENCOUNTER — Telehealth: Payer: Self-pay | Admitting: Family Medicine

## 2022-09-14 ENCOUNTER — Other Ambulatory Visit: Payer: Self-pay | Admitting: *Deleted

## 2022-09-14 MED ORDER — VITAMIN D (ERGOCALCIFEROL) 1.25 MG (50000 UNIT) PO CAPS: 50000.0000 [IU] | ORAL_CAPSULE | ORAL | 3 refills | Status: DC

## 2022-09-14 NOTE — Telephone Encounter (Signed)
PATIENT AWARE OF LAB RESULTS °

## 2022-09-26 DIAGNOSIS — Z933 Colostomy status: Secondary | ICD-10-CM | POA: Diagnosis not present

## 2022-09-26 DIAGNOSIS — Z936 Other artificial openings of urinary tract status: Secondary | ICD-10-CM | POA: Diagnosis not present

## 2022-09-26 DIAGNOSIS — Z8551 Personal history of malignant neoplasm of bladder: Secondary | ICD-10-CM | POA: Diagnosis not present

## 2022-09-26 DIAGNOSIS — Z85038 Personal history of other malignant neoplasm of large intestine: Secondary | ICD-10-CM | POA: Diagnosis not present

## 2022-10-27 DIAGNOSIS — Z8551 Personal history of malignant neoplasm of bladder: Secondary | ICD-10-CM | POA: Diagnosis not present

## 2022-10-27 DIAGNOSIS — Z85038 Personal history of other malignant neoplasm of large intestine: Secondary | ICD-10-CM | POA: Diagnosis not present

## 2022-10-27 DIAGNOSIS — Z933 Colostomy status: Secondary | ICD-10-CM | POA: Diagnosis not present

## 2022-10-27 DIAGNOSIS — Z936 Other artificial openings of urinary tract status: Secondary | ICD-10-CM | POA: Diagnosis not present

## 2022-12-01 DIAGNOSIS — Z936 Other artificial openings of urinary tract status: Secondary | ICD-10-CM | POA: Diagnosis not present

## 2022-12-01 DIAGNOSIS — Z85038 Personal history of other malignant neoplasm of large intestine: Secondary | ICD-10-CM | POA: Diagnosis not present

## 2022-12-01 DIAGNOSIS — Z933 Colostomy status: Secondary | ICD-10-CM | POA: Diagnosis not present

## 2022-12-01 DIAGNOSIS — Z8551 Personal history of malignant neoplasm of bladder: Secondary | ICD-10-CM | POA: Diagnosis not present

## 2022-12-14 ENCOUNTER — Encounter: Payer: Self-pay | Admitting: *Deleted

## 2022-12-22 ENCOUNTER — Encounter: Payer: Self-pay | Admitting: Internal Medicine

## 2023-01-05 DIAGNOSIS — Z85038 Personal history of other malignant neoplasm of large intestine: Secondary | ICD-10-CM | POA: Diagnosis not present

## 2023-01-05 DIAGNOSIS — Z8551 Personal history of malignant neoplasm of bladder: Secondary | ICD-10-CM | POA: Diagnosis not present

## 2023-01-05 DIAGNOSIS — Z933 Colostomy status: Secondary | ICD-10-CM | POA: Diagnosis not present

## 2023-01-05 DIAGNOSIS — Z936 Other artificial openings of urinary tract status: Secondary | ICD-10-CM | POA: Diagnosis not present

## 2023-01-11 ENCOUNTER — Encounter: Payer: Self-pay | Admitting: Internal Medicine

## 2023-01-19 ENCOUNTER — Telehealth: Payer: Self-pay | Admitting: Family Medicine

## 2023-01-19 NOTE — Telephone Encounter (Signed)
Contacted Drenda Freeze to schedule their annual wellness visit. Appointment made for 07/08/2023.  Thank you,  Judeth Cornfield,  AMB Clinical Support [facility] AWV Program Direct Dial ??[phone]

## 2023-01-26 ENCOUNTER — Encounter: Payer: Self-pay | Admitting: Internal Medicine

## 2023-01-26 ENCOUNTER — Ambulatory Visit (AMBULATORY_SURGERY_CENTER): Payer: Medicare Other

## 2023-01-26 VITALS — Ht 73.0 in | Wt 247.0 lb

## 2023-01-26 DIAGNOSIS — Z86010 Personal history of colonic polyps: Secondary | ICD-10-CM

## 2023-01-26 DIAGNOSIS — Z85038 Personal history of other malignant neoplasm of large intestine: Secondary | ICD-10-CM

## 2023-01-26 NOTE — Progress Notes (Signed)
Pre visit completed via phone call; Patient verified name, DOB, and address;  No egg or soy allergy known to patient;  No issues known to pt with past sedation with any surgeries or procedures; Patient denies ever being told they had issues or difficulty with intubation;  No FH of Malignant Hyperthermia Pt is not on diet pills; Pt is not on home 02;  Pt is not on blood thinners;  Pt denies issues with constipation;  No A fib or A flutter Have any cardiac testing pending--NO Pt instructed to use Singlecare.com or GoodRx for a price reduction on prep   Insurance verified during PV appt=U[employer] Medicare  Patient's chart reviewed by Cathlyn Parsons CNRA prior to previsit and patient appropriate for the [facility].  Previsit completed and red dot placed by patient's name on their procedure day (on provider's schedule).    Instructions printed and sent via mail to patient per his request;

## 2023-02-10 DIAGNOSIS — Z933 Colostomy status: Secondary | ICD-10-CM | POA: Diagnosis not present

## 2023-02-10 DIAGNOSIS — Z8551 Personal history of malignant neoplasm of bladder: Secondary | ICD-10-CM | POA: Diagnosis not present

## 2023-02-10 DIAGNOSIS — Z936 Other artificial openings of urinary tract status: Secondary | ICD-10-CM | POA: Diagnosis not present

## 2023-02-10 DIAGNOSIS — Z85038 Personal history of other malignant neoplasm of large intestine: Secondary | ICD-10-CM | POA: Diagnosis not present

## 2023-02-12 ENCOUNTER — Inpatient Hospital Stay: Payer: Medicare Other | Admitting: Oncology

## 2023-02-12 ENCOUNTER — Encounter: Payer: Self-pay | Admitting: *Deleted

## 2023-02-12 ENCOUNTER — Inpatient Hospital Stay: Payer: Medicare Other | Attending: Oncology

## 2023-02-12 DIAGNOSIS — C20 Malignant neoplasm of rectum: Secondary | ICD-10-CM | POA: Insufficient documentation

## 2023-02-12 NOTE — Progress Notes (Signed)
Austin Robbins was "no show" for lab/OV today. Sent scheduling message to reschedule him in few weeks (after his colonoscopy on 6/12).

## 2023-02-15 ENCOUNTER — Telehealth: Payer: Self-pay | Admitting: Oncology

## 2023-02-15 NOTE — Telephone Encounter (Signed)
Left patient a vm to call back to get rescheduled

## 2023-02-16 ENCOUNTER — Telehealth: Payer: Self-pay | Admitting: Oncology

## 2023-02-17 ENCOUNTER — Encounter: Payer: Self-pay | Admitting: Internal Medicine

## 2023-02-17 ENCOUNTER — Ambulatory Visit (AMBULATORY_SURGERY_CENTER): Payer: Medicare Other | Admitting: Internal Medicine

## 2023-02-17 VITALS — BP 138/84 | HR 50 | Temp 97.8°F | Resp 18 | Ht 73.0 in | Wt 247.0 lb

## 2023-02-17 DIAGNOSIS — D124 Benign neoplasm of descending colon: Secondary | ICD-10-CM

## 2023-02-17 DIAGNOSIS — Z08 Encounter for follow-up examination after completed treatment for malignant neoplasm: Secondary | ICD-10-CM

## 2023-02-17 DIAGNOSIS — Z85038 Personal history of other malignant neoplasm of large intestine: Secondary | ICD-10-CM

## 2023-02-17 MED ORDER — SODIUM CHLORIDE 0.9 % IV SOLN: 500.0000 mL | Freq: Once | INTRAVENOUS | Status: DC

## 2023-02-17 NOTE — Progress Notes (Signed)
Gastroenterology History and Physical   Primary Care Physician:  Mechele Claude, MD   Reason for Procedure:   Hx rectal cancer  Plan:    colonoscopy     HPI: Austin Robbins is a 67 y.o. male w/ hx rectal cancer for surveillance exam - last exam 2021 9 polyps  10/25/2013 - diminutive adenoma (and rectal cancer) 11/2019 9 diminutive adenomas repeat 2024 Past Medical History:  Diagnosis Date   Adenocarcinoma of rectum ([facility]) 10/25/2013   10/25/2013 colonoscopy   Allergy    Anemia    Colon cancer ([facility]) 11/03/2013   Gastritis    H. pylori infection 08/07/2013   Hyperlipidemia    on meds   Hypertension 05/13/2016   on meds   Kidney stone    Panic attacks    on meds   Personal history of colonic polyps - adenoma 10/25/2013   S/P colostomy ([facility])     Past Surgical History:  Procedure Laterality Date   APPLICATION OF WOUND VAC N/A 11/03/2013   Procedure: APPLICATION OF WOUND VAC;  Surgeon: Romie Levee, MD;  Location: [facility];  Service: General;  Laterality: N/A;   BOWEL RESECTION N/A 11/03/2013   Procedure: OPEN Jerald Kief Reggie Pile RESECTION/COLOSTOMY;  Surgeon: Romie Levee, MD;  Location: [facility];  Service: General;  Laterality: N/A;   COLONOSCOPY  10/25/2013   COLONOSCOPY  2021   Cg-MAC-miralax (good)-tics   CYSTECTOMY W/ URETEROSIGMOIDOSTOMY  11/04/2013   CYSTOSCOPY WITH URETEROSCOPY AND STENT PLACEMENT Bilateral 11/03/2013   Procedure: CYSTOSCOPY WITH RIGHT RETROGRADE STENT PLACEMENT , bladder biopsy,TOTAL PROSTATE WITH ENBLOCK CYSTECTOMY AND PROSTATECTOMY/ COLON CONDUIT URINARY DIVERSION/ INSERTION BILATERAL STENTS/ ILEOSTOMY;  Surgeon: Sebastian Ache, MD;  Location: [facility];  Service: Urology;  Laterality: Bilateral;  bladder biopsy   LOW ANTERIOR BOWEL RESECTION  11/04/2013   PORT-A-CATH REMOVAL N/A 10/07/2017   Procedure: REMOVAL PORT-A-CATH;  Surgeon: Romie Levee, MD;  Location: [facility];  Service: General;  Laterality: N/A;   PORTACATH PLACEMENT Left  12/18/2013   Procedure: INSERTION PORT-A-CATH;  Surgeon: Romie Levee, MD;  Location: [facility];  Service: General;  Laterality: Left;   TONSILLECTOMY      Prior to Admission medications   Medication Sig Start Date End Date Taking? Authorizing Provider  ALPRAZolam (XANAX) 1 MG tablet TAKE 1/2 TO 1 TABLET BY MOUTH AS NEEDED FOR SLEEP 09/09/22  Yes [name], Broadus John, MD  amLODipine (NORVASC) 5 MG tablet Take 1 tablet (5 mg total) by mouth daily. 09/09/22  Yes Mechele Claude, MD  atorvastatin (LIPITOR) 40 MG tablet Take 1 tablet (40 mg total) by mouth daily. 09/09/22  Yes [name], Broadus John, MD  Coenzyme Q10 (CO Q10 PO) Take 1 tablet by mouth daily at 6 (six) AM.   Yes [provider]  fenofibrate 160 MG tablet Take 1 tablet (160 mg total) by mouth daily. For cholesterol and triglyceride 09/09/22  Yes [name], Broadus John, MD  Vitamin D, Ergocalciferol, (DRISDOL) 1.25 MG (50000 UNIT) CAPS capsule Take 1 capsule (50,000 Units total) by mouth every 7 (seven) days. 09/14/22  Yes Mechele Claude, MD  indomethacin (INDOCIN) 50 MG capsule Take 1 capsule (50 mg total) by mouth 3 (three) times daily as needed (for gout flare-ups). 08/06/22   Mechele Claude, MD    Current Outpatient Medications  Medication Sig Dispense Refill   ALPRAZolam (XANAX) 1 MG tablet TAKE 1/2 TO 1 TABLET BY MOUTH AS NEEDED FOR SLEEP 30 tablet 5   amLODipine (NORVASC) 5 MG tablet Take 1 tablet (5 mg total)  by mouth daily. 90 tablet 3   atorvastatin (LIPITOR) 40 MG tablet Take 1 tablet (40 mg total) by mouth daily. 90 tablet 3   Coenzyme Q10 (CO Q10 PO) Take 1 tablet by mouth daily at 6 (six) AM.     fenofibrate 160 MG tablet Take 1 tablet (160 mg total) by mouth daily. For cholesterol and triglyceride 90 tablet 3   Vitamin D, Ergocalciferol, (DRISDOL) 1.25 MG (50000 UNIT) CAPS capsule Take 1 capsule (50,000 Units total) by mouth every 7 (seven) days. 13 capsule 3   indomethacin (INDOCIN) 50 MG capsule Take 1 capsule (50 mg  total) by mouth 3 (three) times daily as needed (for gout flare-ups). 45 capsule 0   Current Facility-Administered Medications  Medication Dose Route Frequency Provider Last Rate Last Admin   0.9 %  sodium chloride infusion  500 mL Intravenous Once Iva Boop, MD       Facility-Administered Medications Ordered in Other Visits  Medication Dose Route Frequency Provider Last Rate Last Admin   sodium chloride 0.9 % injection 10 mL  10 mL Intravenous PRN Ladene Artist, MD   10 mL at 11/14/14 1030    Allergies as of 02/17/2023 - Review Complete 02/17/2023  Allergen Reaction Noted   Codeine Other (See Comments) 08/07/2013    Family History  Problem Relation Age of Onset   Lung cancer Mother 24       smoker   Healthy Mother    COPD Mother    Lung cancer Father 73       smoker   Colon cancer Neg Hx    Colon polyps Neg Hx    Esophageal cancer Neg Hx    Rectal cancer Neg Hx    Stomach cancer Neg Hx     Social History   Socioeconomic History   Marital status: Married    Spouse name: Terri   Number of children: 2   Years of education: Not on file   Highest education level: Not on file  Occupational History   Occupation: Holiday representative  Tobacco Use   Smoking status: Never   Smokeless tobacco: Never  Vaping Use   Vaping Use: Never used  Substance and Sexual Activity   Alcohol use: Yes    Alcohol/week: 12.0 standard drinks of alcohol    Types: 12 Cans of beer per week   Drug use: No   Sexual activity: Not on file  Other Topics Concern   Not on file  Social History Narrative   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 + children  Never smoker. 2 caffeine drinks per day. Prior regular fairly heavy alcohol until recently as of 10/06/2013.      Social Determinants of Health   Financial Resource Strain: Low Risk  (07/06/2022)   Overall Financial Resource Strain (CARDIA)    Difficulty of Paying Living Expenses: Not hard at all  Food Insecurity: No Food  Insecurity (07/06/2022)   Hunger Vital Sign    Worried About Running Out of Food in the Last Year: Never true    Ran Out of Food in the Last Year: Never true  Transportation Needs: No Transportation Needs (07/06/2022)   PRAPARE - Administrator, Civil Service (Medical): No    Lack of Transportation (Non-Medical): No  Physical Activity: Sufficiently Active (07/06/2022)   Exercise Vital Sign    Days of Exercise per Week: 5 days    Minutes of Exercise per Session: 30 min  Stress: No Stress Concern  Present (07/06/2022)   [employer] of Occupational Health - Occupational Stress Questionnaire    Feeling of Stress : Not at all  Social Connections: Moderately Integrated (07/06/2022)   Social Connection and Isolation Panel [NHANES]    Frequency of Communication with Friends and Family: More than three times a week    Frequency of Social Gatherings with Friends and Family: More than three times a week    Attends Religious Services: More than 4 times per year    Active Member of [employer] or Organizations: Yes    Attends Banker Meetings: More than 4 times per year    Marital Status: Widowed  Intimate Partner Violence: Not At Risk (07/06/2022)   Humiliation, Afraid, Rape, and Kick questionnaire    Fear of Current or Ex-Partner: No    Emotionally Abused: No    Physically Abused: No    Sexually Abused: No    Review of Systems:  All other review of systems negative except as mentioned in the HPI.  Physical Exam: Vital signs BP 122/68   Pulse (!) 50   Temp 97.8 F (36.6 C)   Ht 6\' 1"  (1.854 m)   Wt 247 lb (112 kg)   SpO2 98%   BMI 32.59 kg/m   General:   Alert,  Well-developed, well-nourished, pleasant and cooperative in NAD Lungs:  Clear throughout to auscultation.   Heart:  Regular rate and rhythm; no murmurs, clicks, rubs,  or gallops. Abdomen:  Soft, nontender and nondistended. Normal bowel sounds.   Neuro/Psych:  Alert and cooperative. Normal mood  and affect. A and O x 3   @Nazim Kadlec  Sena Slate, MD, [facility] Gastroenterology [phone] (pager) 02/17/2023 9:41 AM@

## 2023-02-17 NOTE — Progress Notes (Signed)
Sedate, gd SR, tolerated procedure well, VSS, report to RN 

## 2023-02-17 NOTE — Progress Notes (Signed)
Pt's states no medical or surgical changes since previsit or office visit. 

## 2023-02-17 NOTE — Op Note (Signed)
[facility] Patient Name: Austin Robbins Procedure Date: 02/17/2023 9:47 AM MRN: [id number] Endoscopist: Iva Boop , MD, [id number] Age: 67 Referring MD:  Date of Birth: 12-Jul-1956 Gender: Male Account #: [id number] Procedure:                Colonoscopy Indications:              High risk colon cancer surveillance: Personal                            history of rectal cancer and adenomas, Last                            colonoscopy: March 2021 Medicines:                Monitored Anesthesia Care Procedure:                Pre-Anesthesia Assessment:                           - Prior to the procedure, a History and Physical                            was performed, and patient medications and                            allergies were reviewed. The patient's tolerance of                            previous anesthesia was also reviewed. The risks                            and benefits of the procedure and the sedation                            options and risks were discussed with the patient.                            All questions were answered, and informed consent                            was obtained. Prior Anticoagulants: The patient has                            taken no anticoagulant or antiplatelet agents. ASA                            Grade Assessment: III - A patient with severe                            systemic disease. After reviewing the risks and                            benefits, the patient was deemed in satisfactory  condition to undergo the procedure.                           After obtaining informed consent, the colonoscope                            was passed under direct vision. Throughout the                            procedure, the patient's blood pressure, pulse, and                            oxygen saturations were monitored continuously. The                            CF [id number] #[id number] was introduced through  the                            sigmoid colostomy and advanced to the the cecum,                            identified by appendiceal orifice and ileocecal                            valve. The colonoscopy was performed without                            difficulty. The patient tolerated the procedure                            well. The quality of the bowel preparation was                            good. The ileocecal valve and the appendiceal                            orifice were photographed. Scope In: 9:52:04 AM Scope Out: 9:59:50 AM Scope Withdrawal Time: 0 hours 6 minutes 46 seconds  Total Procedure Duration: 0 hours 7 minutes 46 seconds  Findings:                 There was evidence of a widely patent end colostomy                            in the sigmoid colon. This was characterized by                            healthy appearing mucosa.                           A diminutive polyp was found in the descending                            colon. The polyp was sessile. The polyp was removed  with a cold snare. Resection and retrieval were                            complete. Verification of patient identification                            for the specimen was done. Estimated blood loss was                            minimal.                           Multiple diverticula were found in the entire colon.                           The exam was otherwise without abnormality. Complications:            No immediate complications. Estimated Blood Loss:     Estimated blood loss was minimal. Impression:               - Widely patent end colostomy with healthy                            appearing mucosa in the sigmoid colon.                           - One diminutive polyp in the descending colon,                            removed with a cold snare. Resected and retrieved.                           - Diverticulosis in the entire examined colon.                            - The examination was otherwise normal.                           - Personal history of malignant rectal neoplasm.                           - Personal history of colonic polyps. 9 adenomas                            2021 Recommendation:           - Repeat colonoscopy is recommended for                            surveillance. The colonoscopy date will be                            determined after pathology results from today's                            exam become available for review.                           -  Patient has a contact number available for                            emergencies. The signs and symptoms of potential                            delayed complications were discussed with the                            patient. Return to normal activities tomorrow.                            Written discharge instructions were provided to the                            patient.                           - Resume previous diet.                           - Continue present medications. Iva Boop, MD 02/17/2023 10:09:51 AM This report has been signed electronically.

## 2023-02-17 NOTE — Progress Notes (Signed)
Called to room to assist during endoscopic procedure.  Patient ID and intended procedure confirmed with present staff. Received instructions for my participation in the procedure from the performing physician.  

## 2023-02-17 NOTE — Patient Instructions (Addendum)
I found and removed 1 tiny polyp.  I will let you know pathology results and when to have another routine colonoscopy by mail and/or My Chart.  You still have diverticulosis - thickened muscle rings and pouches in the colon wall.   I appreciate the opportunity to care for you. Iva Boop, MD, [facility]   Handout on polyps & diverticulosis given to you today   Await pathology results on polyp removed     YOU HAD AN ENDOSCOPIC PROCEDURE TODAY AT THE [facility]:   Refer to the procedure report that was given to you for any specific questions about what was found during the examination.  If the procedure report does not answer your questions, please call your gastroenterologist to clarify.  If you requested that your care partner not be given the details of your procedure findings, then the procedure report has been included in a sealed envelope for you to review at your convenience later.  YOU SHOULD EXPECT: Some feelings of bloating in the abdomen. Passage of more gas than usual.  Walking can help get rid of the air that was put into your GI tract during the procedure and reduce the bloating. If you had a lower endoscopy (such as a colonoscopy or flexible sigmoidoscopy) you may notice spotting of blood in your stool or on the toilet paper. If you underwent a bowel prep for your procedure, you may not have a normal bowel movement for a few days.  Please Note:  You might notice some irritation and congestion in your nose or some drainage.  This is from the oxygen used during your procedure.  There is no need for concern and it should clear up in a day or so.  SYMPTOMS TO REPORT IMMEDIATELY:  Following lower endoscopy (colonoscopy or flexible sigmoidoscopy):  Excessive amounts of blood in the stool  Significant tenderness or worsening of abdominal pains  Swelling of the abdomen that is new, acute  Fever of 100F or higher  For urgent or emergent issues, a gastroenterologist  can be reached at any hour by calling [phone]. Do not use MyChart messaging for urgent concerns.    DIET:  We do recommend a small meal at first, but then you may proceed to your regular diet.  Drink plenty of fluids but you should avoid alcoholic beverages for 24 hours.  ACTIVITY:  You should plan to take it easy for the rest of today and you should NOT DRIVE or use heavy machinery until tomorrow (because of the sedation medicines used during the test).    FOLLOW UP: Our staff will call the number listed on your records the next business day following your procedure.  We will call around 7:15- 8:00 am to check on you and address any questions or concerns that you may have regarding the information given to you following your procedure. If we do not reach you, we will leave a message.     If any biopsies were taken you will be contacted by phone or by letter within the next 1-3 weeks.  Please call us at [phone] if you have not heard about the biopsies in 3 weeks.    SIGNATURES/CONFIDENTIALITY: You and/or your care partner have signed paperwork which will be entered into your electronic medical record.  These signatures attest to the fact that that the information above on your After Visit Summary has been reviewed and is understood.  Full responsibility of the confidentiality of this discharge information  lies with you and/or your care-partner.

## 2023-02-17 NOTE — Progress Notes (Signed)
Patient has Colostomy and Urostomy bag.

## 2023-02-18 ENCOUNTER — Telehealth: Payer: Self-pay

## 2023-02-18 NOTE — Telephone Encounter (Signed)
Attempted f/u call. No answer, left VM. 

## 2023-03-01 ENCOUNTER — Telehealth: Payer: Self-pay

## 2023-03-01 ENCOUNTER — Inpatient Hospital Stay: Payer: Medicare Other | Admitting: Oncology

## 2023-03-01 ENCOUNTER — Inpatient Hospital Stay: Payer: Medicare Other

## 2023-03-01 VITALS — BP 142/72 | HR 57 | Temp 98.0°F | Resp 18 | Ht 73.0 in | Wt 235.0 lb

## 2023-03-01 DIAGNOSIS — C20 Malignant neoplasm of rectum: Secondary | ICD-10-CM

## 2023-03-01 LAB — CEA (ACCESS): CEA (CHCC): 3.69 ng/mL (ref 0.00–5.00)

## 2023-03-01 NOTE — Progress Notes (Signed)
[facility] OFFICE PROGRESS NOTE   Diagnosis: Rectal cancer  INTERVAL HISTORY:   Austin Robbins returns as scheduled.  He feels well.  The urostomy and colostomy are functioning well.  Good appetite.  No new complaint.  He had a colonoscopy earlier this month.  Objective:  Vital signs in last 24 hours:  Blood pressure (!) 142/72, pulse (!) 57, temperature 98 F (36.7 C), temperature source Oral, resp. rate 18, height 6\' 1"  (1.854 m), weight 235 lb (106.6 kg), SpO2 96 %.    Lymphatics: No cervical, supraclavicular, axillary, or inguinal nodes Resp: Lungs clear bilaterally Cardio: Regular rate and rhythm GI: No hepatosplenomegaly, left lower quadrant colostomy, right lower quadrant urostomy Vascular: No leg edema   Lab Results:  Lab Results  Component Value Date   WBC 6.0 09/09/2022   HGB 15.9 09/09/2022   HCT 45.6 09/09/2022   MCV 91 09/09/2022   PLT 191 09/09/2022   NEUTROABS 3.4 09/09/2022    CMP  Lab Results  Component Value Date   NA 142 09/09/2022   K 4.3 09/09/2022   CL 105 09/09/2022   CO2 23 09/09/2022   GLUCOSE 108 (H) 09/09/2022   BUN 13 09/09/2022   CREATININE 0.88 09/09/2022   CALCIUM 9.3 09/09/2022   PROT 6.4 09/09/2022   ALBUMIN 4.2 09/09/2022   AST 19 09/09/2022   ALT 20 09/09/2022   ALKPHOS 49 09/09/2022   BILITOT 0.4 09/09/2022   GFRNONAA 91 05/02/2020   GFRAA 105 05/02/2020    Lab Results  Component Value Date   CEA1 2.20 02/04/2021   CEA 3.69 03/01/2023    Medications: I have reviewed the patient's current medications.   Assessment/Plan:  Clinical stage IV (Z6X,W9U,E4) adenocarcinoma of the rectum with tumor directly extending to the bladder/prostate and CT scan evidence of metastatic chest adenopathy Positive K-ras mutation.   Normal mismatch repair protein expression. Microsatellite stable   Status post low anterior resection and end colostomy with a cystoprostatectomy and colon conduit urinary diversion  11/01/2013.   Staging PET scan with hypermetabolic pelvic nodes, mediastinal nodes, left adrenal metastasis, and left upper lobe nodule.   Initiation of FOLFOX 12/25/2013.   Restaging CT 03/16/2014 after 6 cycles of FOLFOX confirmed improvement in chest/pelvic lymphadenopathy, a left upper lobe nodule, and resolution of a left adrenal nodule   Oxaliplatin deleted from cycle 8 FOLFOX 04/02/2014 secondary to neuropathy and thrombocytopenia.   Oxaliplatin held with cycle 9 FOLFOX 04/16/2014 due to neuropathy.   Cycle 10 FOLFOX 04/30/2014.   Cycle 11 FOLFOX 05/15/2014.   Cycle 12 FOLFOX 05/28/2014. Oxaliplatin held due to increased neuropathy.   Restaging CT evaluation 06/08/2014 with stable mediastinal and hilar adenopathy. Stable borderline left iliac lymph node. No new or progressive disease identified within the chest, abdomen or pelvis.   "Maintenance" 5-fluorouracil beginning 06/11/2014.   Restaging CT evaluation 10/10/2014 showed similar mild mediastinal and right hilar adenopathy. No visible recurrence of the prior left upper lobe metastatic lesion or the left adrenal metastatic lesion. No new lesions noted.   Maintenance Xeloda on a 7 day on/7 day off schedule beginning 10/25/2014   Restaging CTs 03/04/2015 with stable mediastinal lymph nodes and no evidence of metastatic disease   Xeloda continued   Xeloda dose reduced beginning 04/15/2015 due to progressive hand-foot syndrome.   Xeloda placed on hold 05/15/2015 due to continued hand/foot pain.   Xeloda resumed 06/10/2015.   Restaging CTs 08/09/2015 with no evidence of disease progression   Continuation of maintenance Xeloda  CTs 08/03/2016-no evidence of metastatic disease, no pathologic chest lymphadenopathy Continuation of maintenance Xeloda CTs 08/09/2017-no evidence for metastatic disease in the chest, abdomen or pelvis.  No evidence for local recurrence in the pelvis. Xeloda discontinued 08/11/2017 Colonoscopy 11/30/2019- multiple  polyps removed from the colon, tubular adenomas-no high-grade dysplasia, next colonoscopy at a 3-year interval Colonoscopy 02/17/2023-polyp removed from the descending colon-tubular adenoma Microcytic anemia-likely iron deficiency, improved.   Gout. Port-A-Cath placement 12/18/2013.   Anxiety. He takes Xanax as needed. History of left knee pain and swelling. He was treated with a Medrol Dosepak 03/05/2014 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 Trial of Cymbalta initiated 07/09/2014. Trial of amitriptyline initiated 12/11/2015. History of Thrombocytopenia secondary to chemotherapy. Skin rash 04/30/2014-resolved with doxycycline Chronic edema left lower leg/ankle. Negative Doppler 09/17/2014 Hand-foot syndrome secondary to Xeloda. Improved. Enlargement of the left testicle 08/13/2015. He has been evaluated by urology. Hypertension. Norvasc prescribed 05/13/2016. Resumed 10/28/2016. Reports not taking 12/02/2016 due to normal blood pressure outside of the office. Reports taking consistently 02/24/2017. Right leg edema 04/07/2017. Doppler negative for DVT. Left flank pain/hematuria 12/20/2019-CT with increased left hydronephrosis and ureterectasis to the level of the ileal conduit anastomosis, treated with antibiotics     Disposition: Austin Robbins is in clinical remission from rectal cancer.  He will return for an office visit and CEA in 8 months.  He will continue colonoscopy surveillance with Dr. Leone Payor.  Thornton Papas, MD  03/01/2023  12:32 PM

## 2023-03-01 NOTE — Telephone Encounter (Signed)
-----   Message from Ladene Artist, MD sent at 03/01/2023  1:24 PM EDT ----- Please call patient, the CEA is normal, follow-up as scheduled

## 2023-03-01 NOTE — Telephone Encounter (Signed)
Patient gave verbal understanding and had no further questions or concerns  

## 2023-03-04 ENCOUNTER — Encounter: Payer: Self-pay | Admitting: Internal Medicine

## 2023-03-08 ENCOUNTER — Ambulatory Visit (INDEPENDENT_AMBULATORY_CARE_PROVIDER_SITE_OTHER): Payer: Medicare Other | Admitting: Family Medicine

## 2023-03-08 ENCOUNTER — Encounter: Payer: Self-pay | Admitting: Family Medicine

## 2023-03-08 VITALS — BP 119/72 | HR 60 | Temp 98.3°F | Ht 73.0 in | Wt 235.0 lb

## 2023-03-08 DIAGNOSIS — I10 Essential (primary) hypertension: Secondary | ICD-10-CM | POA: Diagnosis not present

## 2023-03-08 DIAGNOSIS — E782 Mixed hyperlipidemia: Secondary | ICD-10-CM

## 2023-03-08 DIAGNOSIS — M1A0720 Idiopathic chronic gout, left ankle and foot, without tophus (tophi): Secondary | ICD-10-CM | POA: Diagnosis not present

## 2023-03-08 DIAGNOSIS — F410 Panic disorder [episodic paroxysmal anxiety] without agoraphobia: Secondary | ICD-10-CM

## 2023-03-08 DIAGNOSIS — G4701 Insomnia due to medical condition: Secondary | ICD-10-CM | POA: Diagnosis not present

## 2023-03-08 DIAGNOSIS — Z79899 Other long term (current) drug therapy: Secondary | ICD-10-CM | POA: Diagnosis not present

## 2023-03-08 LAB — CBC WITH DIFFERENTIAL/PLATELET
Basophils Absolute: 0 10*3/uL (ref 0.0–0.2)
Eos: 4 %
Hemoglobin: 15.2 g/dL (ref 13.0–17.7)
MCH: 31.2 pg (ref 26.6–33.0)
Monocytes Absolute: 0.5 10*3/uL (ref 0.1–0.9)

## 2023-03-08 LAB — CMP14+EGFR

## 2023-03-08 LAB — LIPID PANEL

## 2023-03-08 MED ORDER — ALPRAZOLAM 1 MG PO TABS: ORAL_TABLET | ORAL | 5 refills | Status: DC

## 2023-03-08 NOTE — Progress Notes (Signed)
Subjective:  Patient ID: Austin Robbins, male    DOB: 1955-09-22  Age: 67 y.o. MRN: [id number]  CC: Medical Management of Chronic Issues   HPI Austin Robbins presents for  follow-up of hypertension. Patient has no history of headache chest pain or shortness of breath or recent cough. Patient also denies symptoms of TIA such as focal numbness or weakness. Patient denies side effects from medication. States taking it regularly.   in for follow-up of elevated cholesterol. Doing well without complaints on current medication. Denies side effects of statin including myalgia and arthralgia and nausea. Currently no chest pain, shortness of breath or other cardiovascular related symptoms noted.      03/08/2023    8:06 AM 09/09/2022    8:17 AM 03/04/2022    8:10 AM 09/02/2021    9:05 AM  GAD 7 : Generalized Anxiety Score  Nervous, Anxious, on Edge 1 0 1 1  Control/stop worrying 2 1 1  0  Worry too much - different things 1 1 0 1  Trouble relaxing 0 0 1 0  Restless 0 0 0 0  Easily annoyed or irritable 0 0 0 0  Afraid - awful might happen 0 0 1 0  Total GAD 7 Score 4 2 4 2   Anxiety Difficulty Somewhat difficult Not difficult at all Not difficult at all Not difficult at all   More anxious due to daughter moving back in. Boyfriend not helping with the bills.     History Austin Robbins has a past medical history of Adenocarcinoma of rectum (HCC) (10/25/2013), Allergy, Anemia, Colon cancer (HCC) (11/03/2013), Gastritis, H. pylori infection (08/07/2013), Hyperlipidemia, Hypertension (05/13/2016), Kidney stone, Panic attacks, Personal history of colonic polyps - adenoma (10/25/2013), and S/P colostomy (HCC).   He has a past surgical history that includes Tonsillectomy; Low anterior bowel resection (11/04/2013); Cystectomy w/ ureterosigmoidostomy (11/04/2013); Bowel resection (N/A, 11/03/2013); Application if wound vac (N/A, 11/03/2013); Cystoscopy with ureteroscopy and stent placement (Bilateral, 11/03/2013);  Portacath placement (Left, 12/18/2013); Port-a-cath removal (N/A, 10/07/2017); Colonoscopy (10/25/2013); and Colonoscopy (2021).   His family history includes COPD in his mother; Healthy in his mother; Lung cancer (age of onset: 20) in his father; Lung cancer (age of onset: 65) in his mother.He reports that he has never smoked. He has never used smokeless tobacco. He reports current alcohol use of about 12.0 standard drinks of alcohol per week. He reports that he does not use drugs.  Current Outpatient Medications on File Prior to Visit  Medication Sig Dispense Refill   amLODipine (NORVASC) 5 MG tablet Take 1 tablet (5 mg total) by mouth daily. 90 tablet 3   atorvastatin (LIPITOR) 40 MG tablet Take 1 tablet (40 mg total) by mouth daily. 90 tablet 3   Coenzyme Q10 (CO Q10 PO) Take 1 tablet by mouth daily at 6 (six) AM.     fenofibrate 160 MG tablet Take 1 tablet (160 mg total) by mouth daily. For cholesterol and triglyceride 90 tablet 3   indomethacin (INDOCIN) 50 MG capsule Take 1 capsule (50 mg total) by mouth 3 (three) times daily as needed (for gout flare-ups). 45 capsule 0   Vitamin D, Ergocalciferol, (DRISDOL) 1.25 MG (50000 UNIT) CAPS capsule Take 1 capsule (50,000 Units total) by mouth every 7 (seven) days. 13 capsule 3   Current Facility-Administered Medications on File Prior to Visit  Medication Dose Route Frequency Provider Last Rate Last Admin   sodium chloride 0.9 % injection 10 mL  10 mL Intravenous PRN Thornton Papas  B, MD   10 mL at 11/14/14 1030    ROS Review of Systems  Constitutional:  Negative for fever.  Respiratory:  Negative for shortness of breath.   Cardiovascular:  Negative for chest pain.  Musculoskeletal:  Negative for arthralgias.  Skin:  Negative for rash.    Objective:  BP 119/72   Pulse 60   Temp 98.3 F (36.8 C)   Ht 6\' 1"  (1.854 m)   Wt 235 lb (106.6 kg)   SpO2 98%   BMI 31.00 kg/m   BP Readings from Last 3 Encounters:  03/08/23 119/72   03/01/23 (!) 142/72  02/17/23 138/84    Wt Readings from Last 3 Encounters:  03/08/23 235 lb (106.6 kg)  03/01/23 235 lb (106.6 kg)  02/17/23 247 lb (112 kg)     Physical Exam Vitals reviewed.  Constitutional:      Appearance: He is well-developed.  HENT:     Head: Normocephalic and atraumatic.     Right Ear: External ear normal.     Left Ear: External ear normal.     Mouth/Throat:     Pharynx: No oropharyngeal exudate or posterior oropharyngeal erythema.  Eyes:     Pupils: Pupils are equal, round, and reactive to light.  Cardiovascular:     Rate and Rhythm: Normal rate and regular rhythm.     Heart sounds: No murmur heard. Pulmonary:     Effort: No respiratory distress.     Breath sounds: Normal breath sounds.  Musculoskeletal:     Cervical back: Normal range of motion and neck supple.  Neurological:     Mental Status: He is alert and oriented to person, place, and time.       Assessment & Plan:   Samuelle was seen today for medical management of chronic issues.  Diagnoses and all orders for this visit:  Primary hypertension -     CBC with Differential/Platelet -     CMP14+EGFR  Mixed hyperlipidemia -     Lipid panel  Controlled substance agreement signed -     ToxASSURE Select 13 (MW), Urine  Panic attacks -     ALPRAZolam (XANAX) 1 MG tablet; TAKE 1/2 TO 1 TABLET BY MOUTH AS NEEDED FOR SLEEP  Insomnia due to medical condition -     ALPRAZolam (XANAX) 1 MG tablet; TAKE 1/2 TO 1 TABLET BY MOUTH AS NEEDED FOR SLEEP  Chronic gout of left foot, unspecified cause -     Uric acid   Allergies as of 03/08/2023       Reactions   Codeine Other (See Comments)   Chest pain        Medication List        Accurate as of March 08, 2023  8:28 AM. If you have any questions, ask your nurse or doctor.          ALPRAZolam 1 MG tablet Commonly known as: XANAX TAKE 1/2 TO 1 TABLET BY MOUTH AS NEEDED FOR SLEEP   amLODipine 5 MG tablet Commonly known as:  NORVASC Take 1 tablet (5 mg total) by mouth daily.   atorvastatin 40 MG tablet Commonly known as: LIPITOR Take 1 tablet (40 mg total) by mouth daily.   CO Q10 PO Take 1 tablet by mouth daily at 6 (six) AM.   fenofibrate 160 MG tablet Take 1 tablet (160 mg total) by mouth daily. For cholesterol and triglyceride   indomethacin 50 MG capsule Commonly known as: INDOCIN Take 1 capsule (50  mg total) by mouth 3 (three) times daily as needed (for gout flare-ups).   Vitamin D (Ergocalciferol) 1.25 MG (50000 UNIT) Caps capsule Commonly known as: DRISDOL Take 1 capsule (50,000 Units total) by mouth every 7 (seven) days.        Meds ordered this encounter  Medications   ALPRAZolam (XANAX) 1 MG tablet    Sig: TAKE 1/2 TO 1 TABLET BY MOUTH AS NEEDED FOR SLEEP    Dispense:  30 tablet    Refill:  5      Follow-up: Return in about 6 months (around 09/08/2023) for [employer] physical.  Mechele Claude, M.D.

## 2023-03-09 LAB — CMP14+EGFR
ALT: 28 IU/L (ref 0–44)
AST: 18 IU/L (ref 0–40)
Albumin: 4.3 g/dL (ref 3.9–4.9)
Alkaline Phosphatase: 57 IU/L (ref 44–121)
BUN: 13 mg/dL (ref 8–27)
Bilirubin Total: 0.5 mg/dL (ref 0.0–1.2)
CO2: 23 mmol/L (ref 20–29)
Calcium: 9.9 mg/dL (ref 8.6–10.2)
Chloride: 103 mmol/L (ref 96–106)
Creatinine, Ser: 0.98 mg/dL (ref 0.76–1.27)
Globulin, Total: 2.2 g/dL (ref 1.5–4.5)
Glucose: 122 mg/dL — ABNORMAL HIGH (ref 70–99)
Potassium: 4.6 mmol/L (ref 3.5–5.2)
Sodium: 142 mmol/L (ref 134–144)
Total Protein: 6.5 g/dL (ref 6.0–8.5)
eGFR: 85 mL/min/{1.73_m2} (ref >59)

## 2023-03-09 LAB — CBC WITH DIFFERENTIAL/PLATELET
Basos: 1 %
EOS (ABSOLUTE): 0.3 10*3/uL (ref 0.0–0.4)
Hematocrit: 44.6 % (ref 37.5–51.0)
Immature Grans (Abs): 0 10*3/uL (ref 0.0–0.1)
Immature Granulocytes: 0 %
Lymphocytes Absolute: 2.1 10*3/uL (ref 0.7–3.1)
Lymphs: 34 %
MCHC: 34.1 g/dL (ref 31.5–35.7)
MCV: 92 fL (ref 79–97)
Monocytes: 9 %
Neutrophils Absolute: 3.3 10*3/uL (ref 1.4–7.0)
Neutrophils: 52 %
Platelets: 203 10*3/uL (ref 150–450)
RBC: 4.87 x10E6/uL (ref 4.14–5.80)
RDW: 12 % (ref 11.6–15.4)
WBC: 6.2 10*3/uL (ref 3.4–10.8)

## 2023-03-09 LAB — LIPID PANEL
Chol/HDL Ratio: 3.5 ratio (ref 0.0–5.0)
Cholesterol, Total: 127 mg/dL (ref 100–199)
LDL Chol Calc (NIH): 64 mg/dL (ref 0–99)
Triglycerides: 154 mg/dL — ABNORMAL HIGH (ref 0–149)
VLDL Cholesterol Cal: 27 mg/dL (ref 5–40)

## 2023-03-09 LAB — URIC ACID: Uric Acid: 5.1 mg/dL (ref 3.8–8.4)

## 2023-03-09 NOTE — Progress Notes (Signed)
Hello Oval,  Your lab result is normal and/or stable.Some minor variations that are not significant are commonly marked abnormal, but do not represent any medical problem for you.  Best regards, Jama Krichbaum, M.D.

## 2023-03-10 ENCOUNTER — Ambulatory Visit: Payer: Medicare Other | Admitting: Family Medicine

## 2023-03-16 LAB — TOXASSURE SELECT 13 (MW), URINE

## 2023-03-18 DIAGNOSIS — Z933 Colostomy status: Secondary | ICD-10-CM | POA: Diagnosis not present

## 2023-03-18 DIAGNOSIS — Z8551 Personal history of malignant neoplasm of bladder: Secondary | ICD-10-CM | POA: Diagnosis not present

## 2023-03-18 DIAGNOSIS — Z936 Other artificial openings of urinary tract status: Secondary | ICD-10-CM | POA: Diagnosis not present

## 2023-03-18 DIAGNOSIS — Z85038 Personal history of other malignant neoplasm of large intestine: Secondary | ICD-10-CM | POA: Diagnosis not present

## 2023-03-23 DIAGNOSIS — Z85038 Personal history of other malignant neoplasm of large intestine: Secondary | ICD-10-CM | POA: Diagnosis not present

## 2023-03-23 DIAGNOSIS — Z8551 Personal history of malignant neoplasm of bladder: Secondary | ICD-10-CM | POA: Diagnosis not present

## 2023-03-23 DIAGNOSIS — Z936 Other artificial openings of urinary tract status: Secondary | ICD-10-CM | POA: Diagnosis not present

## 2023-03-23 DIAGNOSIS — Z933 Colostomy status: Secondary | ICD-10-CM | POA: Diagnosis not present

## 2023-04-20 ENCOUNTER — Encounter: Payer: Self-pay | Admitting: *Deleted

## 2023-04-23 DIAGNOSIS — Z933 Colostomy status: Secondary | ICD-10-CM | POA: Diagnosis not present

## 2023-04-23 DIAGNOSIS — Z85038 Personal history of other malignant neoplasm of large intestine: Secondary | ICD-10-CM | POA: Diagnosis not present

## 2023-04-23 DIAGNOSIS — Z8551 Personal history of malignant neoplasm of bladder: Secondary | ICD-10-CM | POA: Diagnosis not present

## 2023-04-23 DIAGNOSIS — Z936 Other artificial openings of urinary tract status: Secondary | ICD-10-CM | POA: Diagnosis not present

## 2023-07-08 ENCOUNTER — Ambulatory Visit: Payer: Medicare Other

## 2023-07-08 VITALS — Ht 73.0 in | Wt 240.0 lb

## 2023-07-08 DIAGNOSIS — Z0000 Encounter for general adult medical examination without abnormal findings: Secondary | ICD-10-CM | POA: Diagnosis not present

## 2023-07-08 DIAGNOSIS — Z0100 Encounter for examination of eyes and vision without abnormal findings: Secondary | ICD-10-CM

## 2023-07-08 NOTE — Patient Instructions (Signed)
Austin Robbins , Thank you for taking time to come for your Medicare Wellness Visit. I appreciate your ongoing commitment to your health goals. Please review the following plan we discussed and let me know if I can assist you in the future.   Referrals/Orders/Follow-Ups/Clinician Recommendations: Aim for 30 minutes of exercise or brisk walking, 6-8 glasses of water, and 5 servings of fruits and vegetables each day.   This is a list of the screening recommended for you and due dates:  Health Maintenance  Topic Date Due   Zoster (Shingles) Vaccine (1 of 2) Never done   Flu Shot  04/08/2023   COVID-19 Vaccine (4 - 2023-24 season) 05/09/2023   Pneumonia Vaccine (1 of 1 - PCV) 03/07/2024*   Medicare Annual Wellness Visit  07/07/2024   Colon Cancer Screening  02/17/2028   DTaP/Tdap/Td vaccine (2 - Td or Tdap) 02/21/2030   Hepatitis C Screening  Completed   HPV Vaccine  Aged Out  *Topic was postponed. The date shown is not the original due date.    Advanced directives: (Provided) Advance directive discussed with you today. I have provided a copy for you to complete at home and have notarized. Once this is complete, please bring a copy in to our office so we can scan it into your chart. Information on Advanced Care Planning can be found at [facility] Advance Health Care Directives Advance Health Care Directives ([email or web address]    Next Medicare Annual Wellness Visit scheduled for next year: Yes  insert Preventive Care attachment Insert FALL PREVENTION attachment if needed

## 2023-07-08 NOTE — Progress Notes (Signed)
Subjective:   Austin Robbins is a 67 y.o. male who presents for Medicare Annual/Subsequent preventive examination.  Visit Complete: Virtual I connected with  Drenda Freeze on 07/08/23 by a audio enabled telemedicine application and verified that I am speaking with the correct person using two identifiers.  Patient Location: Home  Provider Location: Home Office  I discussed the limitations of evaluation and management by telemedicine. The patient expressed understanding and agreed to proceed.  Vital Signs: Because this visit was a virtual/telehealth visit, some criteria may be missing or patient reported. Any vitals not documented were not able to be obtained and vitals that have been documented are patient reported.  Patient Medicare AWV questionnaire was completed by the patient on 07/08/2023; I have confirmed that all information answered by patient is correct and no changes since this date.  Cardiac Risk Factors include: advanced age (>73men, >19 women);male gender;dyslipidemia     Objective:    Today's Vitals   07/08/23 1033  Weight: 240 lb (108.9 kg)  Height: 6\' 1"  (1.854 m)   Body mass index is 31.66 kg/m.     07/08/2023   10:37 AM 07/06/2022    1:31 PM 02/12/2022    8:04 AM 08/06/2021    8:24 AM 02/02/2020    9:31 AM 12/18/2019    6:38 PM 08/29/2018    8:13 AM  Advanced Directives  Does Patient Have a Medical Advance Directive? No No No No No No No  Would patient like information on creating a medical advance directive? Yes (MAU/Ambulatory/Procedural Areas - Information given) No - Patient declined No - Patient declined No - Patient declined No - Patient declined  No - Patient declined    Current Medications (verified) Outpatient Encounter Medications as of 07/08/2023  Medication Sig   ALPRAZolam (XANAX) 1 MG tablet TAKE 1/2 TO 1 TABLET BY MOUTH AS NEEDED FOR SLEEP   amLODipine (NORVASC) 5 MG tablet Take 1 tablet (5 mg total) by mouth daily.   atorvastatin  (LIPITOR) 40 MG tablet Take 1 tablet (40 mg total) by mouth daily.   Coenzyme Q10 (CO Q10 PO) Take 1 tablet by mouth daily at 6 (six) AM.   fenofibrate 160 MG tablet Take 1 tablet (160 mg total) by mouth daily. For cholesterol and triglyceride   indomethacin (INDOCIN) 50 MG capsule Take 1 capsule (50 mg total) by mouth 3 (three) times daily as needed (for gout flare-ups).   Vitamin D, Ergocalciferol, (DRISDOL) 1.25 MG (50000 UNIT) CAPS capsule Take 1 capsule (50,000 Units total) by mouth every 7 (seven) days.   Facility-Administered Encounter Medications as of 07/08/2023  Medication   sodium chloride 0.9 % injection 10 mL    Allergies (verified) Codeine   History: Past Medical History:  Diagnosis Date   Adenocarcinoma of rectum (HCC) 10/25/2013   10/25/2013 colonoscopy   Allergy    Anemia    Colon cancer (HCC) 11/03/2013   Gastritis    H. pylori infection 08/07/2013   Hyperlipidemia    on meds   Hypertension 05/13/2016   on meds   Kidney stone    Panic attacks    on meds   Personal history of colonic polyps - adenoma 10/25/2013   S/P colostomy (HCC)    Past Surgical History:  Procedure Laterality Date   APPLICATION OF WOUND VAC N/A 11/03/2013   Procedure: APPLICATION OF WOUND VAC;  Surgeon: Romie Levee, MD;  Location: [facility];  Service: General;  Laterality: N/A;   BOWEL RESECTION N/A  11/03/2013   Procedure: OPEN ENCOLOSTOMY /COLON RESECTION/COLOSTOMY;  Surgeon: Romie Levee, MD;  Location: [facility];  Service: General;  Laterality: N/A;   COLONOSCOPY  10/25/2013   COLONOSCOPY  2021   Cg-MAC-miralax (good)-tics   CYSTECTOMY W/ URETEROSIGMOIDOSTOMY  11/04/2013   CYSTOSCOPY WITH URETEROSCOPY AND STENT PLACEMENT Bilateral 11/03/2013   Procedure: CYSTOSCOPY WITH RIGHT RETROGRADE STENT PLACEMENT , bladder biopsy,TOTAL PROSTATE WITH ENBLOCK CYSTECTOMY AND PROSTATECTOMY/ COLON CONDUIT URINARY DIVERSION/ INSERTION BILATERAL STENTS/ ILEOSTOMY;  Surgeon: Sebastian Ache, MD;   Location: [facility];  Service: Urology;  Laterality: Bilateral;  bladder biopsy   LOW ANTERIOR BOWEL RESECTION  11/04/2013   PORT-A-CATH REMOVAL N/A 10/07/2017   Procedure: REMOVAL PORT-A-CATH;  Surgeon: Romie Levee, MD;  Location: [facility];  Service: General;  Laterality: N/A;   PORTACATH PLACEMENT Left 12/18/2013   Procedure: INSERTION PORT-A-CATH;  Surgeon: Romie Levee, MD;  Location: [facility];  Service: General;  Laterality: Left;   TONSILLECTOMY     Family History  Problem Relation Age of Onset   Lung cancer Mother 36       smoker   Healthy Mother    COPD Mother    Lung cancer Father 6       smoker   Colon cancer Neg Hx    Colon polyps Neg Hx    Esophageal cancer Neg Hx    Rectal cancer Neg Hx    Stomach cancer Neg Hx    Social History   Socioeconomic History   Marital status: Married    Spouse name: Terri   Number of children: 2   Years of education: Not on file   Highest education level: Not on file  Occupational History   Occupation: Holiday representative  Tobacco Use   Smoking status: Never   Smokeless tobacco: Never  Vaping Use   Vaping status: Never Used  Substance and Sexual Activity   Alcohol use: Yes    Alcohol/week: 12.0 Robbins drinks of alcohol    Types: 12 Cans of beer per week   Drug use: No   Sexual activity: Not on file  Other Topics Concern   Not on file  Social History Narrative   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 + children  Never smoker. 2 caffeine drinks per day. Prior regular fairly heavy alcohol until recently as of 10/06/2013.      Social Determinants of Health   Financial Resource Strain: Low Risk  (07/08/2023)   Overall Financial Resource Strain (CARDIA)    Difficulty of Paying Living Expenses: Not hard at all  Food Insecurity: No Food Insecurity (07/08/2023)   Hunger Vital Sign    Worried About Running Out of Food in the Last Year: Never true    Ran Out of Food in the Last Year: Never true   Transportation Needs: No Transportation Needs (07/08/2023)   PRAPARE - Administrator, Civil Service (Medical): No    Lack of Transportation (Non-Medical): No  Physical Activity: Insufficiently Active (07/08/2023)   Exercise Vital Sign    Days of Exercise per Week: 3 days    Minutes of Exercise per Session: 30 min  Stress: No Stress Concern Present (07/08/2023)   [employer] of Occupational Health - Occupational Stress Questionnaire    Feeling of Stress : Not at all  Social Connections: Moderately Isolated (07/08/2023)   Social Connection and Isolation Panel [NHANES]    Frequency of Communication with Friends and Family: More than three times a week    Frequency of  Social Gatherings with Friends and Family: More than three times a week    Attends Religious Services: 1 to 4 times per year    Active Member of [employer] or Organizations: No    Attends Banker Meetings: Never    Marital Status: Widowed    Tobacco Counseling Counseling given: Not Answered   Clinical Intake:  Pre-visit preparation completed: Yes  Pain : No/denies pain     Nutritional Risks: None Diabetes: No  How often do you need to have someone help you when you read instructions, pamphlets, or other written materials from your doctor or pharmacy?: 1 - Never  Interpreter Needed?: No  Information entered by :: Renie Ora, LPN   Activities of Daily Living    07/08/2023   10:38 AM  In your present state of health, do you have any difficulty performing the following activities:  Hearing? 0  Vision? 0  Difficulty concentrating or making decisions? 0  Walking or climbing stairs? 0  Dressing or bathing? 0  Doing errands, shopping? 0  Preparing Food and eating ? N  Using the Toilet? N  In the past six months, have you accidently leaked urine? N  Do you have problems with loss of bowel control? N  Managing your Medications? N  Managing your Finances? N  Housekeeping or  managing your Housekeeping? N    Patient Care Team: Mechele Claude, MD as PCP - General (Family Medicine) Berneice Heinrich Delbert Phenix., MD as Consulting Physician (Urology) Romie Levee, MD as Consulting Physician (General Surgery) Iva Boop, MD as Consulting Physician (Gastroenterology) Cira Rue, RN as Registered Nurse  Indicate any recent Medical Services you may have received from other than [facility] providers in the past year (date may be approximate).     Assessment:   This is a routine wellness examination for [place].  Hearing/Vision screen Vision Screening - Comments:: Referral 07/08/2023   Goals Addressed             This Visit's Progress    Exercise 3x per week (30 min per time)   On track      Depression Screen    07/08/2023   10:36 AM 03/08/2023    8:06 AM 03/08/2023    7:51 AM 09/09/2022    8:17 AM 07/06/2022    1:30 PM 03/04/2022    8:09 AM 03/04/2022    8:04 AM  PHQ 2/9 Scores  PHQ - 2 Score 0 2 0 1 0 2 0  PHQ- 9 Score  5  6  5      Fall Risk    07/08/2023   10:34 AM 03/08/2023    7:51 AM 09/09/2022    8:16 AM 07/06/2022    1:28 PM 03/04/2022    8:04 AM  Fall Risk   Falls in the past year? 0 0 0 0 0  Number falls in past yr: 0   0   Injury with Fall? 0   0   Risk for fall due to : No Fall Risks   No Fall Risks   Follow up Falls prevention discussed   Falls prevention discussed     MEDICARE RISK AT HOME: Medicare Risk at Home Any stairs in or around the home?: Yes If so, are there any without handrails?: No Home free of loose throw rugs in walkways, pet beds, electrical cords, etc?: Yes Adequate lighting in your home to reduce risk of falls?: Yes Life alert?: No Use of a cane, walker or  w/c?: No Grab bars in the bathroom?: Yes Shower chair or bench in shower?: Yes Elevated toilet seat or a handicapped toilet?: Yes  TIMED UP AND GO:  Was the test performed?  No    Cognitive Function:        07/08/2023   10:38 AM 07/06/2022    1:31 PM   6CIT Screen  What Year? 0 points 0 points  What month? 0 points 0 points  What time? 0 points 0 points  Count back from 20 0 points 0 points  Months in reverse 0 points 0 points  Repeat phrase 0 points 0 points  Total Score 0 points 0 points    Immunizations Immunization History  Administered Date(s) Administered   Fluad Quad(high Dose 65+) 08/06/2021   Influenza,inj,Quad PF,6+ Mos 06/25/2014, 08/13/2015, 08/05/2016, 06/30/2017, 08/29/2018, 06/05/2019, 08/05/2020   PFIZER(Purple Top)SARS-COV-2 Vaccination 02/29/2020, 03/21/2020, 09/27/2020   Tdap 02/22/2020    TDAP status: Up to date  Flu Vaccine status: Due, Education has been provided regarding the importance of this vaccine. Advised may receive this vaccine at local pharmacy or Health Dept. Aware to provide a copy of the vaccination record if obtained from local pharmacy or Health Dept. Verbalized acceptance and understanding.  Pneumococcal vaccine status: Due, Education has been provided regarding the importance of this vaccine. Advised may receive this vaccine at local pharmacy or Health Dept. Aware to provide a copy of the vaccination record if obtained from local pharmacy or Health Dept. Verbalized acceptance and understanding.  Covid-19 vaccine status: Completed vaccines  Qualifies for Shingles Vaccine? Yes   Zostavax completed No   Shingrix Completed?: No.    Education has been provided regarding the importance of this vaccine. Patient has been advised to call insurance company to determine out of pocket expense if they have not yet received this vaccine. Advised may also receive vaccine at local pharmacy or Health Dept. Verbalized acceptance and understanding.  Screening Tests Health Maintenance  Topic Date Due   Zoster Vaccines- Shingrix (1 of 2) Never done   INFLUENZA VACCINE  04/08/2023   COVID-19 Vaccine (4 - 2023-24 season) 05/09/2023   Pneumonia Vaccine 81+ Years old (1 of 1 - PCV) 03/07/2024 (Originally  02/22/2021)   Medicare Annual Wellness (AWV)  07/07/2024   Colonoscopy  02/17/2028   DTaP/Tdap/Td (2 - Td or Tdap) 02/21/2030   Hepatitis C Screening  Completed   HPV VACCINES  Aged Out    Health Maintenance  Health Maintenance Due  Topic Date Due   Zoster Vaccines- Shingrix (1 of 2) Never done   INFLUENZA VACCINE  04/08/2023   COVID-19 Vaccine (4 - 2023-24 season) 05/09/2023    Colorectal cancer screening: Type of screening: Colonoscopy. Completed 02/17/2023. Repeat every 5 years  Lung Cancer Screening: (Low Dose CT Chest recommended if Age 75-80 years, 20 pack-year currently smoking OR have quit w/in 15years.) does not qualify.   Lung Cancer Screening Referral: n/a  Additional Screening:  Hepatitis C Screening: does not qualify; Completed 05/02/2020  Vision Screening: Recommended annual ophthalmology exams for early detection of glaucoma and other disorders of the eye. Is the patient up to date with their annual eye exam?  No  Who is the provider or what is the name of the office in which the patient attends annual eye exams? None referral 07/08/2023 If pt is not established with a provider, would they like to be referred to a provider to establish care? No .   Dental Screening: Recommended annual dental exams for proper  oral hygiene   Community Resource Referral / Chronic Care Management: CRR required this visit?  No   CCM required this visit?  No     Plan:     I have personally reviewed and noted the following in the patient's chart:   Medical and social history Use of alcohol, tobacco or illicit drugs  Current medications and supplements including opioid prescriptions. Patient is not currently taking opioid prescriptions. Functional ability and status Nutritional status Physical activity Advanced directives List of other physicians Hospitalizations, surgeries, and ER visits in previous 12 months Vitals Screenings to include cognitive, depression, and  falls Referrals and appointments  In addition, I have reviewed and discussed with patient certain preventive protocols, quality metrics, and best practice recommendations. A written personalized care plan for preventive services as well as general preventive health recommendations were provided to patient.     Lorrene Reid, LPN   16/06/9603   After Visit Summary: (MyChart) Due to this being a telephonic visit, the after visit summary with patients personalized plan was offered to patient via MyChart   Nurse Notes: Due Pneumonia vaccine

## 2023-07-13 ENCOUNTER — Encounter: Payer: Medicare Other | Admitting: Family Medicine

## 2023-07-20 ENCOUNTER — Encounter: Payer: Self-pay | Admitting: Family Medicine

## 2023-07-20 ENCOUNTER — Encounter: Payer: Medicare Other | Admitting: Family Medicine

## 2023-07-22 ENCOUNTER — Other Ambulatory Visit: Payer: Self-pay | Admitting: Family Medicine

## 2023-07-22 DIAGNOSIS — E782 Mixed hyperlipidemia: Secondary | ICD-10-CM

## 2023-07-22 DIAGNOSIS — C20 Malignant neoplasm of rectum: Secondary | ICD-10-CM

## 2023-07-22 DIAGNOSIS — I10 Essential (primary) hypertension: Secondary | ICD-10-CM

## 2023-07-22 NOTE — Telephone Encounter (Signed)
[name] NTBS in Jan for 6 mos FU RF sent to pharmacy

## 2023-07-23 ENCOUNTER — Encounter: Payer: Self-pay | Admitting: Family Medicine

## 2023-07-23 NOTE — Telephone Encounter (Signed)
Letter sent.

## 2023-07-26 ENCOUNTER — Ambulatory Visit (INDEPENDENT_AMBULATORY_CARE_PROVIDER_SITE_OTHER): Payer: Medicare Other | Admitting: Family Medicine

## 2023-07-26 ENCOUNTER — Encounter: Payer: Self-pay | Admitting: Family Medicine

## 2023-07-26 VITALS — BP 118/72 | HR 59 | Temp 98.4°F | Ht 73.0 in | Wt 240.0 lb

## 2023-07-26 DIAGNOSIS — Z0000 Encounter for general adult medical examination without abnormal findings: Secondary | ICD-10-CM | POA: Diagnosis not present

## 2023-07-26 DIAGNOSIS — M1A0720 Idiopathic chronic gout, left ankle and foot, without tophus (tophi): Secondary | ICD-10-CM

## 2023-07-26 DIAGNOSIS — E559 Vitamin D deficiency, unspecified: Secondary | ICD-10-CM

## 2023-07-26 DIAGNOSIS — F410 Panic disorder [episodic paroxysmal anxiety] without agoraphobia: Secondary | ICD-10-CM

## 2023-07-26 DIAGNOSIS — G4701 Insomnia due to medical condition: Secondary | ICD-10-CM

## 2023-07-26 DIAGNOSIS — Z0001 Encounter for general adult medical examination with abnormal findings: Secondary | ICD-10-CM | POA: Diagnosis not present

## 2023-07-26 DIAGNOSIS — Z125 Encounter for screening for malignant neoplasm of prostate: Secondary | ICD-10-CM

## 2023-07-26 DIAGNOSIS — E782 Mixed hyperlipidemia: Secondary | ICD-10-CM

## 2023-07-26 DIAGNOSIS — I10 Essential (primary) hypertension: Secondary | ICD-10-CM | POA: Diagnosis not present

## 2023-07-26 DIAGNOSIS — Z23 Encounter for immunization: Secondary | ICD-10-CM

## 2023-07-26 LAB — URINALYSIS
Bilirubin, UA: NEGATIVE
Glucose, UA: NEGATIVE
Ketones, UA: NEGATIVE
Leukocytes,UA: NEGATIVE
Nitrite, UA: POSITIVE — AB
Protein,UA: NEGATIVE
Specific Gravity, UA: 1.015 (ref 1.005–1.030)
Urobilinogen, Ur: 1 mg/dL (ref 0.2–1.0)
pH, UA: 8 — ABNORMAL HIGH (ref 5.0–7.5)

## 2023-07-26 MED ORDER — ALPRAZOLAM 1 MG PO TABS: ORAL_TABLET | ORAL | 5 refills | Status: DC

## 2023-07-26 NOTE — Progress Notes (Addendum)
Subjective:  Patient ID: Austin Robbins, male    DOB: 1955-11-03  Age: 67 y.o. MRN: [id number]  CC: Annual Exam   HPI Austin Robbins presents for Annual physical.   Worries too much. Xanax works about 90% of the time.   Indocin knocks out gout pain in the toe within a day of onset. Occurring every 2 mos, sometimes less frequent.  Wearing urine bag due to hx of rectal Ca invasive into the bladder in 2015     07/26/2023    3:22 PM 07/26/2023    2:48 PM 07/08/2023   10:36 AM  Depression screen PHQ 2/9  Decreased Interest 1 0 0  Down, Depressed, Hopeless 1 0 0  PHQ - 2 Score 2 0 0  Altered sleeping 2    Tired, decreased energy 2    Change in appetite 1    Feeling bad or failure about yourself  1    Trouble concentrating 0    Moving slowly or fidgety/restless 0    Suicidal thoughts 0    PHQ-9 Score 8    Difficult doing work/chores Somewhat difficult      History Austin Robbins has a past medical history of Adenocarcinoma of rectum (HCC) (10/25/2013), Allergy, Anemia, Colon cancer (HCC) (11/03/2013), Gastritis, H. pylori infection (08/07/2013), Hyperlipidemia, Hypertension (05/13/2016), Kidney stone, Panic attacks, Personal history of colonic polyps - adenoma (10/25/2013), and S/P colostomy (HCC).   He has a past surgical history that includes Tonsillectomy; Low anterior bowel resection (11/04/2013); Cystectomy w/ ureterosigmoidostomy (11/04/2013); Bowel resection (N/A, 11/03/2013); Application if wound vac (N/A, 11/03/2013); Cystoscopy with ureteroscopy and stent placement (Bilateral, 11/03/2013); Portacath placement (Left, 12/18/2013); Port-a-cath removal (N/A, 10/07/2017); Colonoscopy (10/25/2013); and Colonoscopy (2021).   His family history includes COPD in his mother; Healthy in his mother; Lung cancer (age of onset: 47) in his father; Lung cancer (age of onset: 42) in his mother.He reports that he has never smoked. He has never used smokeless tobacco. He reports current alcohol  use of about 12.0 standard drinks of alcohol per week. He reports that he does not use drugs.    ROS Review of Systems  Constitutional:  Negative for activity change, fatigue and unexpected weight change.  HENT:  Negative for congestion, ear pain, hearing loss, postnasal drip and trouble swallowing.   Eyes:  Negative for pain and visual disturbance.  Respiratory:  Negative for cough, chest tightness and shortness of breath.   Cardiovascular:  Negative for chest pain, palpitations and leg swelling.  Gastrointestinal:  Negative for abdominal distention, abdominal pain, blood in stool, constipation, diarrhea, nausea and vomiting.  Endocrine: Negative for cold intolerance, heat intolerance and polydipsia.  Genitourinary:  Negative for difficulty urinating, dysuria, flank pain, frequency and urgency.  Musculoskeletal:  Negative for arthralgias and joint swelling.  Skin:  Negative for color change, rash and wound.  Neurological:  Negative for dizziness, syncope, speech difficulty, weakness, light-headedness, numbness and headaches.  Hematological:  Does not bruise/bleed easily.  Psychiatric/Behavioral:  Negative for confusion, decreased concentration, dysphoric mood and sleep disturbance. The patient is not nervous/anxious.     Objective:  BP 118/72   Pulse (!) 59   Temp 98.4 F (36.9 C)   Ht 6\' 1"  (1.854 m)   Wt 240 lb (108.9 kg)   SpO2 96%   BMI 31.66 kg/m   BP Readings from Last 3 Encounters:  07/26/23 118/72  03/08/23 119/72  03/01/23 (!) 142/72    Wt Readings from Last 3 Encounters:  07/26/23 240 lb (  108.9 kg)  07/08/23 240 lb (108.9 kg)  03/08/23 235 lb (106.6 kg)     Physical Exam Constitutional:      Appearance: He is well-developed.  HENT:     Head: Normocephalic and atraumatic.  Eyes:     Pupils: Pupils are equal, round, and reactive to light.  Neck:     Thyroid: No thyromegaly.     Trachea: No tracheal deviation.  Cardiovascular:     Rate and Rhythm:  Normal rate and regular rhythm.     Heart sounds: Normal heart sounds. No murmur heard.    No friction rub. No gallop.  Pulmonary:     Breath sounds: Normal breath sounds. No wheezing or rales.  Abdominal:     General: Bowel sounds are normal. There is no distension.     Palpations: Abdomen is soft. There is no mass.     Tenderness: There is no abdominal tenderness.     Hernia: There is no hernia in the left inguinal area.  Genitourinary:    Penis: Normal.      Testes: Normal.  Musculoskeletal:        General: Normal range of motion.     Cervical back: Normal range of motion.  Lymphadenopathy:     Cervical: No cervical adenopathy.  Skin:    General: Skin is warm and dry.  Neurological:     Mental Status: He is alert and oriented to person, place, and time.       Assessment & Plan:   Austin Robbins was seen today for annual exam.  Diagnoses and all orders for this visit:  Physical exam, annual -     CBC with Differential/Platelet -     CMP14+EGFR  Primary hypertension -     CBC with Differential/Platelet -     CMP14+EGFR -     Urinalysis  Mixed hyperlipidemia -     CBC with Differential/Platelet -     CMP14+EGFR -     Lipid panel  Vitamin D deficiency -     CBC with Differential/Platelet -     CMP14+EGFR -     VITAMIN D 25 Hydroxy (Vit-D Deficiency, Fractures)  Need for influenza vaccination -     Flu Vaccine Trivalent High Dose (Fluad) -     CBC with Differential/Platelet -     CMP14+EGFR  Chronic gout of left foot, unspecified cause -     Uric acid  Screening for prostate cancer -     PSA, total and free  Panic attacks -     ALPRAZolam (XANAX) 1 MG tablet; TAKE 1/2 TO 1 TABLET BY MOUTH AS NEEDED FOR SLEEP  Insomnia due to medical condition -     ALPRAZolam (XANAX) 1 MG tablet; TAKE 1/2 TO 1 TABLET BY MOUTH AS NEEDED FOR SLEEP   Will need supplies for ostomy including bedside drainage bags, ostomy skin barrier60/90 days Ostomy pouch drain for barrier   Ostomy urine pouches Solid skin barriers  Each need appears to be 60/90 days.    I am having Austin Robbins maintain his Coenzyme Q10 (CO Q10 PO), indomethacin, Vitamin D (Ergocalciferol), amLODipine, fenofibrate, atorvastatin, and ALPRAZolam.  Allergies as of 07/26/2023       Reactions   Codeine Other (See Comments)   Chest pain        Medication List        Accurate as of July 26, 2023  5:44 PM. If you have any questions, ask your nurse or doctor.  ALPRAZolam 1 MG tablet Commonly known as: XANAX TAKE 1/2 TO 1 TABLET BY MOUTH AS NEEDED FOR SLEEP   amLODipine 5 MG tablet Commonly known as: NORVASC Take 1 tablet (5 mg total) by mouth daily.   atorvastatin 40 MG tablet Commonly known as: LIPITOR Take 1 tablet (40 mg total) by mouth daily.   CO Q10 PO Take 1 tablet by mouth daily at 6 (six) AM.   fenofibrate 160 MG tablet Take 1 tablet (160 mg total) by mouth daily. For cholesterol and triglyceride   indomethacin 50 MG capsule Commonly known as: INDOCIN Take 1 capsule (50 mg total) by mouth 3 (three) times daily as needed (for gout flare-ups).   Vitamin D (Ergocalciferol) 1.25 MG (50000 UNIT) Caps capsule Commonly known as: DRISDOL Take 1 capsule (50,000 Units total) by mouth every 7 (seven) days.         Follow-up: Return in about 6 months (around 01/23/2024).  Mechele Claude, M.D.

## 2023-07-27 LAB — CMP14+EGFR
ALT: 27 [IU]/L (ref 0–44)
AST: 27 [IU]/L (ref 0–40)
Albumin: 4.5 g/dL (ref 3.9–4.9)
Alkaline Phosphatase: 62 [IU]/L (ref 44–121)
BUN/Creatinine Ratio: 13 (ref 10–24)
BUN: 12 mg/dL (ref 8–27)
Bilirubin Total: 0.7 mg/dL (ref 0.0–1.2)
CO2: 23 mmol/L (ref 20–29)
Calcium: 9.2 mg/dL (ref 8.6–10.2)
Chloride: 104 mmol/L (ref 96–106)
Creatinine, Ser: 0.95 mg/dL (ref 0.76–1.27)
Globulin, Total: 2.4 g/dL (ref 1.5–4.5)
Glucose: 83 mg/dL (ref 70–99)
Potassium: 4.1 mmol/L (ref 3.5–5.2)
Sodium: 142 mmol/L (ref 134–144)
Total Protein: 6.9 g/dL (ref 6.0–8.5)
eGFR: 88 mL/min/{1.73_m2} (ref >59)

## 2023-07-27 LAB — CBC WITH DIFFERENTIAL/PLATELET
Basophils Absolute: 0 10*3/uL (ref 0.0–0.2)
Basos: 0 %
EOS (ABSOLUTE): 0.2 10*3/uL (ref 0.0–0.4)
Eos: 3 %
Hematocrit: 47.9 % (ref 37.5–51.0)
Hemoglobin: 15.9 g/dL (ref 13.0–17.7)
Immature Grans (Abs): 0 10*3/uL (ref 0.0–0.1)
Immature Granulocytes: 1 %
Lymphocytes Absolute: 2 10*3/uL (ref 0.7–3.1)
Lymphs: 26 %
MCH: 31.4 pg (ref 26.6–33.0)
MCHC: 33.2 g/dL (ref 31.5–35.7)
MCV: 95 fL (ref 79–97)
Monocytes Absolute: 0.6 10*3/uL (ref 0.1–0.9)
Monocytes: 8 %
Neutrophils Absolute: 4.9 10*3/uL (ref 1.4–7.0)
Neutrophils: 62 %
Platelets: 241 10*3/uL (ref 150–450)
RBC: 5.07 x10E6/uL (ref 4.14–5.80)
RDW: 12.1 % (ref 11.6–15.4)
WBC: 7.8 10*3/uL (ref 3.4–10.8)

## 2023-07-27 LAB — VITAMIN D 25 HYDROXY (VIT D DEFICIENCY, FRACTURES): Vit D, 25-Hydroxy: 41.7 ng/mL (ref 30.0–100.0)

## 2023-07-27 LAB — LIPID PANEL
Chol/HDL Ratio: 4.3 ratio (ref 0.0–5.0)
Cholesterol, Total: 150 mg/dL (ref 100–199)
HDL: 35 mg/dL — ABNORMAL LOW (ref >39)
LDL Chol Calc (NIH): 82 mg/dL (ref 0–99)
Triglycerides: 197 mg/dL — ABNORMAL HIGH (ref 0–149)
VLDL Cholesterol Cal: 33 mg/dL (ref 5–40)

## 2023-07-27 LAB — PSA, TOTAL AND FREE
PSA, Free: <0.02 ng/mL
Prostate Specific Ag, Serum: <0.1 ng/mL (ref 0.0–4.0)

## 2023-07-27 LAB — URIC ACID: Uric Acid: 5.3 mg/dL (ref 3.8–8.4)

## 2023-10-01 ENCOUNTER — Telehealth: Payer: Self-pay | Admitting: Family Medicine

## 2023-10-01 DIAGNOSIS — C20 Malignant neoplasm of rectum: Secondary | ICD-10-CM

## 2023-10-01 NOTE — Telephone Encounter (Signed)
LMOVM will NTBS do not have any recent OV notes to support need.

## 2023-10-01 NOTE — Telephone Encounter (Signed)
Copied from CRM [id number]. Topic: General - Other >> Oct 01, 2023 12:09 PM Gaetano Hawthorne wrote: Reason for CRM: Patient's new medical supplier is asking for a new prescription for his incontinence supplies along with some office notes. They will also be faxing in a request - the request will come from [facility].  Call back number - [phone]  Fax number - [phone]

## 2023-10-01 NOTE — Telephone Encounter (Signed)
Please review

## 2023-10-04 NOTE — Telephone Encounter (Signed)
I explained to e2c2 agent notes from nurse. Please call back  Copied from CRM [id number]. Topic: Appointments - Scheduling Inquiry for Clinic >> Oct 04, 2023 11:26 AM Austin Robbins wrote: Reason for CRM: Patient is wanting a call back to discuss why he needs to see Dr. Selina Cooley to get DME supplies he's been getting for 10 years now.

## 2023-10-04 NOTE — Telephone Encounter (Signed)
LMOVM that there needs to be documentation in an OV note to send to [facility] new DME provider.

## 2023-10-04 NOTE — Addendum Note (Signed)
Addended by: Julious Payer D on: 10/04/2023 01:44 PM   Modules accepted: Orders

## 2023-10-04 NOTE — Telephone Encounter (Signed)
TC back to pt, he saw Dr Darlyn Read in Nov, note is not complete on supplies that he uses, will get PCP to addend note and order with specific supplies has been placed.

## 2023-10-05 NOTE — Telephone Encounter (Signed)
Pt made aware that orders for Ostomy supplies have been placed and that he does not need to come in for this on 1/30. Appt cancelled.

## 2023-10-07 ENCOUNTER — Ambulatory Visit: Payer: Medicare Other | Admitting: Family Medicine

## 2023-11-01 ENCOUNTER — Other Ambulatory Visit: Payer: Medicare Other

## 2023-11-01 ENCOUNTER — Inpatient Hospital Stay: Payer: Medicare Other

## 2023-11-01 ENCOUNTER — Inpatient Hospital Stay: Payer: Medicare Other | Attending: Oncology | Admitting: Oncology

## 2023-11-01 VITALS — BP 131/81 | HR 66 | Temp 98.1°F | Resp 18 | Ht 73.0 in | Wt 240.1 lb

## 2023-11-01 DIAGNOSIS — Z85038 Personal history of other malignant neoplasm of large intestine: Secondary | ICD-10-CM | POA: Diagnosis not present

## 2023-11-01 DIAGNOSIS — L989 Disorder of the skin and subcutaneous tissue, unspecified: Secondary | ICD-10-CM | POA: Insufficient documentation

## 2023-11-01 DIAGNOSIS — C20 Malignant neoplasm of rectum: Secondary | ICD-10-CM

## 2023-11-01 LAB — CEA (ACCESS): CEA (CHCC): 3.73 ng/mL (ref 0.00–5.00)

## 2023-11-01 NOTE — Progress Notes (Signed)
 [facility] OFFICE PROGRESS NOTE   Diagnosis: Rectal cancer  INTERVAL HISTORY:   Mr. Moga is as scheduled.  He reports malaise.  No other complaint.  No difficulty with bowel function.  No bleeding.  He reports a skin lesion of the right side of the nose has improved.  He reports almost complete resolution of neuropathy symptoms.  He has mild remaining numbness in the feet.  Objective:  Vital signs in last 24 hours:  Blood pressure 131/81, pulse 66, temperature 98.1 F (36.7 C), temperature source Temporal, resp. rate 18, height 6\' 1"  (1.854 m), weight 240 lb 1.6 oz (108.9 kg), SpO2 98%.   Lymphatics: No cervical, supraclavicular, axillary, or inguinal nodes Resp: Lungs clear bilaterally Cardio: Regular rate and rhythm GI: No hepatosplenomegaly, no mass, nontender, left lower quadrant colostomy, right lower quadrant urostomy Vascular: No leg edema  Skin: Raised tan circular 1 cm lesion at the right labial fold   Lab Results:  Lab Results  Component Value Date   WBC 7.8 07/26/2023   HGB 15.9 07/26/2023   HCT 47.9 07/26/2023   MCV 95 07/26/2023   PLT 241 07/26/2023   NEUTROABS 4.9 07/26/2023    CMP  Lab Results  Component Value Date   NA 142 07/26/2023   K 4.1 07/26/2023   CL 104 07/26/2023   CO2 23 07/26/2023   GLUCOSE 83 07/26/2023   BUN 12 07/26/2023   CREATININE 0.95 07/26/2023   CALCIUM 9.2 07/26/2023   PROT 6.9 07/26/2023   ALBUMIN 4.5 07/26/2023   AST 27 07/26/2023   ALT 27 07/26/2023   ALKPHOS 62 07/26/2023   BILITOT 0.7 07/26/2023   GFRNONAA 91 05/02/2020   GFRAA 105 05/02/2020    Lab Results  Component Value Date   CEA1 2.20 02/04/2021   CEA 3.73 11/01/2023     Medications: I have reviewed the patient's current medications.   Assessment/Plan:  Clinical stage IV (Z6X,W9U,E4) adenocarcinoma of the rectum with tumor directly extending to the bladder/prostate and CT scan evidence of metastatic chest adenopathy Positive K-ras  mutation.   Normal mismatch repair protein expression. Microsatellite stable   Status post low anterior resection and end colostomy with a cystoprostatectomy and colon conduit urinary diversion 11/01/2013.   Staging PET scan with hypermetabolic pelvic nodes, mediastinal nodes, left adrenal metastasis, and left upper lobe nodule.   Initiation of FOLFOX 12/25/2013.   Restaging CT 03/16/2014 after 6 cycles of FOLFOX confirmed improvement in chest/pelvic lymphadenopathy, a left upper lobe nodule, and resolution of a left adrenal nodule   Oxaliplatin deleted from cycle 8 FOLFOX 04/02/2014 secondary to neuropathy and thrombocytopenia.   Oxaliplatin held with cycle 9 FOLFOX 04/16/2014 due to neuropathy.   Cycle 10 FOLFOX 04/30/2014.   Cycle 11 FOLFOX 05/15/2014.   Cycle 12 FOLFOX 05/28/2014. Oxaliplatin held due to increased neuropathy.   Restaging CT evaluation 06/08/2014 with stable mediastinal and hilar adenopathy. Stable borderline left iliac lymph node. No new or progressive disease identified within the chest, abdomen or pelvis.   "Maintenance" 5-fluorouracil beginning 06/11/2014.   Restaging CT evaluation 10/10/2014 showed similar mild mediastinal and right hilar adenopathy. No visible recurrence of the prior left upper lobe metastatic lesion or the left adrenal metastatic lesion. No new lesions noted.   Maintenance Xeloda on a 7 day on/7 day off schedule beginning 10/25/2014   Restaging CTs 03/04/2015 with stable mediastinal lymph nodes and no evidence of metastatic disease   Xeloda continued   Xeloda dose reduced beginning 04/15/2015 due to  progressive hand-foot syndrome.   Xeloda placed on hold 05/15/2015 due to continued hand/foot pain.   Xeloda resumed 06/10/2015.   Restaging CTs 08/09/2015 with no evidence of disease progression   Continuation of maintenance Xeloda CTs 08/03/2016-no evidence of metastatic disease, no pathologic chest lymphadenopathy Continuation of maintenance  Xeloda CTs 08/09/2017-no evidence for metastatic disease in the chest, abdomen or pelvis.  No evidence for local recurrence in the pelvis. Xeloda discontinued 08/11/2017 Colonoscopy 11/30/2019- multiple polyps removed from the colon, tubular adenomas-no high-grade dysplasia, next colonoscopy at a 3-year interval Colonoscopy 02/17/2023-polyp removed from the descending colon-tubular adenoma Microcytic anemia-likely iron deficiency, improved.   Gout. Port-A-Cath placement 12/18/2013.   Anxiety. He takes Xanax as needed. History of left knee pain and swelling. He was treated with a Medrol Dosepak 03/05/2014 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 Trial of Cymbalta initiated 07/09/2014. Trial of amitriptyline initiated 12/11/2015. History of Thrombocytopenia secondary to chemotherapy. Skin rash 04/30/2014-resolved with doxycycline Chronic edema left lower leg/ankle. Negative Doppler 09/17/2014 Hand-foot syndrome secondary to Xeloda. Improved. Enlargement of the left testicle 08/13/2015. He has been evaluated by urology. Hypertension. Norvasc prescribed 05/13/2016. Resumed 10/28/2016. Reports not taking 12/02/2016 due to normal blood pressure outside of the office. Reports taking consistently 02/24/2017. Right leg edema 04/07/2017. Doppler negative for DVT. Left flank pain/hematuria 12/20/2019-CT with increased left hydronephrosis and ureterectasis to the level of the ileal conduit anastomosis, treated with antibiotics     Disposition: Mr. Cone is in clinical remission from rectal cancer.  He is now 10 years out from diagnosis.  He will be discharged to the medical oncology clinic.  I am available to see him as needed. He will continue colonoscopy surveillance with Dr. Leone Payor.  The raised lesion at the right nasolabial fold has the appearance of a basal cell carcinoma.  We will make a dermatology referral.  Thornton Papas, MD  11/01/2023  10:19 AM

## 2023-11-29 ENCOUNTER — Encounter: Payer: Self-pay | Admitting: Dermatology

## 2023-11-29 ENCOUNTER — Ambulatory Visit: Payer: Medicare Other | Admitting: Dermatology

## 2023-11-29 VITALS — BP 137/71 | HR 63

## 2023-11-29 DIAGNOSIS — L570 Actinic keratosis: Secondary | ICD-10-CM

## 2023-11-29 DIAGNOSIS — C44311 Basal cell carcinoma of skin of nose: Secondary | ICD-10-CM

## 2023-11-29 DIAGNOSIS — L719 Rosacea, unspecified: Secondary | ICD-10-CM

## 2023-11-29 DIAGNOSIS — D492 Neoplasm of unspecified behavior of bone, soft tissue, and skin: Secondary | ICD-10-CM | POA: Diagnosis not present

## 2023-11-29 DIAGNOSIS — L281 Prurigo nodularis: Secondary | ICD-10-CM

## 2023-11-29 DIAGNOSIS — D485 Neoplasm of uncertain behavior of skin: Secondary | ICD-10-CM

## 2023-11-29 DIAGNOSIS — W908XXA Exposure to other nonionizing radiation, initial encounter: Secondary | ICD-10-CM | POA: Diagnosis not present

## 2023-11-29 NOTE — Progress Notes (Signed)
 New Patient Visit   Subjective  Austin Robbins is a 68 y.o. male who presents for the following: spot of concern. No hx or family hx of skin cancer. Lesion on face, present for several months, not previously treated, growing with time.   Lesion on left neck, present for several months, itchy and scaly, not previously treated.  The patient has spots, moles and lesions to be evaluated, some may be new or changing.   The following portions of the chart were reviewed this encounter and updated as appropriate: medications, allergies, medical history  Review of Systems:  No other skin or systemic complaints except as noted in HPI or Assessment and Plan.  Objective  Well appearing patient in no apparent distress; mood and affect are within normal limits.    A focused examination was performed of the following areas:  Face and neck  Relevant exam findings are noted in the Assessment and Plan.  Right Nasal Sidewall 1.0 cm pearly pink plaque  Left Posterior Neck 1.2 cm hyperkeratotic papule   Assessment & Plan   NEOPLASM OF UNCERTAIN BEHAVIOR OF SKIN (2) Right Nasal Sidewall Skin / nail biopsy Type of biopsy: tangential   Informed consent: discussed and consent obtained   Timeout: patient name, date of birth, surgical site, and procedure verified   Procedure prep:  Patient was prepped and draped in usual sterile fashion Prep type:  Isopropyl alcohol Anesthesia: the lesion was anesthetized in a standard fashion   Anesthetic:  1% lidocaine w/ epinephrine 1-100,000 buffered w/ 8.4% NaHCO3 Instrument used: DermaBlade   Hemostasis achieved with: aluminum chloride   Outcome: patient tolerated procedure well   Post-procedure details: sterile dressing applied and wound care instructions given   Dressing type: bandage and petrolatum   Specimen 1 - Surgical pathology Differential Diagnosis: r/o BCC vs other  Check Margins: No Left Posterior Neck Skin / nail biopsy Type of  biopsy: tangential   Informed consent: discussed and consent obtained   Timeout: patient name, date of birth, surgical site, and procedure verified   Procedure prep:  Patient was prepped and draped in usual sterile fashion Prep type:  Isopropyl alcohol Anesthesia: the lesion was anesthetized in a standard fashion   Anesthetic:  1% lidocaine w/ epinephrine 1-100,000 buffered w/ 8.4% NaHCO3 Instrument used: DermaBlade   Hemostasis achieved with: aluminum chloride   Outcome: patient tolerated procedure well   Post-procedure details: sterile dressing applied and wound care instructions given   Dressing type: petrolatum and bandage   Specimen 2 - Surgical pathology Differential Diagnosis: r/o nmsc vs other  Check Margins: No ROSACEA   ACTINIC KERATOSES    ACTINIC KERATOSIS Exam: Erythematous thin papules/macules with gritty scale at the central face   Actinic keratoses are precancerous spots that appear secondary to cumulative UV radiation exposure/sun exposure over time. They are chronic with expected duration over 1 year. A portion of actinic keratoses will progress to squamous cell carcinoma of the skin. It is not possible to reliably predict which spots will progress to skin cancer and so treatment is recommended to prevent development of skin cancer.  Recommend daily broad spectrum sunscreen SPF 30+ to sun-exposed areas, reapply every 2 hours as needed.  Recommend staying in the shade or wearing long sleeves, sun glasses (UVA+UVB protection) and wide brim hats (4-inch brim around the entire circumference of the hat). Call for new or changing lesions.  Treatment Plan: We will postpone until after his treatment of likely NMSC on right nasolabial fold  ROSACEA Exam Mid face erythema with telangiectasias +/- scattered inflammatory papules on central face  Flared  Rosacea is a chronic progressive skin condition usually affecting the face of adults, causing redness and/or acne  bumps. It is treatable but not curable. It sometimes affects the eyes (ocular rosacea) as well. It may respond to topical and/or systemic medication and can flare with stress, sun exposure, alcohol, exercise, topical steroids (including hydrocortisone/cortisone 10) and some foods.  Daily application of broad spectrum spf 30+ sunscreen to face is recommended to reduce flares.  Treatment Plan We will postpone until after his treatment of likely NMSC on right nasolabial fold   Return for TBSC/based on biopsy results.  I, Manual Meier, Surg Tech III, am acting as scribe for Gwenith Daily, MD.   Documentation: I have reviewed the above documentation for accuracy and completeness, and I agree with the above.  Gwenith Daily, MD

## 2023-11-29 NOTE — Patient Instructions (Signed)

## 2023-11-30 LAB — SURGICAL PATHOLOGY

## 2023-12-01 ENCOUNTER — Telehealth: Payer: Self-pay

## 2023-12-01 NOTE — Telephone Encounter (Signed)
-----   Message from [facility] PACI sent at 12/01/2023  9:28 AM EDT ----- BCC- right nasal sidewall- Mohs with me Prurigo- left neck- benign picker's nodyle  Please call patient to discuss diagnosis and schedule for Mohs surgery for lesion on nose and reassure about picker's nodule on left neck

## 2023-12-01 NOTE — Telephone Encounter (Signed)
 Spoke with pt gave him bx result and treatment recommendations

## 2023-12-16 ENCOUNTER — Encounter: Payer: Self-pay | Admitting: Dermatology

## 2023-12-22 ENCOUNTER — Ambulatory Visit: Admitting: Dermatology

## 2023-12-22 ENCOUNTER — Encounter: Payer: Self-pay | Admitting: Dermatology

## 2023-12-22 VITALS — BP 124/68 | HR 72 | Temp 98.4°F

## 2023-12-22 DIAGNOSIS — C44311 Basal cell carcinoma of skin of nose: Secondary | ICD-10-CM | POA: Diagnosis not present

## 2023-12-22 DIAGNOSIS — C4491 Basal cell carcinoma of skin, unspecified: Secondary | ICD-10-CM | POA: Insufficient documentation

## 2023-12-22 DIAGNOSIS — L579 Skin changes due to chronic exposure to nonionizing radiation, unspecified: Secondary | ICD-10-CM | POA: Diagnosis not present

## 2023-12-22 DIAGNOSIS — L814 Other melanin hyperpigmentation: Secondary | ICD-10-CM | POA: Diagnosis not present

## 2023-12-22 MED ORDER — OXYCODONE HCL 5 MG PO TABS: 5.0000 mg | ORAL_TABLET | Freq: Four times a day (QID) | ORAL | 0 refills | Status: DC | PRN

## 2023-12-22 NOTE — Progress Notes (Signed)
 Follow-Up Visit   Subjective  Austin Robbins is a 68 y.o. male who presents for the following: Mohs of a nodular Basal Cell Carcinoma on the  right nasal sidewall, biopsied by Dr. Caralyn Guile.  The following portions of the chart were reviewed this encounter and updated as appropriate: medications, allergies, medical history  Review of Systems:  No other skin or systemic complaints except as noted in HPI or Assessment and Plan.  Objective  Well appearing patient in no apparent distress; mood and affect are within normal limits.  A focused examination was performed of the following areas: Right nasal sidewall Relevant physical exam findings are noted in the Assessment and Plan.   Right Nasal Sidewall Pink pearly papule or plaque with arborizing vessels.    Assessment & Plan   BASAL CELL CARCINOMA (BCC), UNSPECIFIED SITE Right Nasal Sidewall Mohs surgery  Consent obtained: written  Anticoagulation: Is the patient taking prescription anticoagulant and/or aspirin prescribed/recommended by a physician? No   Was the anticoagulation regimen changed prior to Mohs? No    Procedure Details: Timeout: pre-procedure verification complete Procedure Prep: patient was prepped and draped in usual sterile fashion Prep type: chlorhexidine Biopsy accession number: [id number] Biopsy lab: [facility] Frozen section biopsy performed: No   Specimen debulked: No   Pre-Op diagnosis: basal cell carcinoma BCC subtype: nodular MohsAIQ Surgical site (if tumor spans multiple areas, please select predominant area): nose Surgery side: right Surgical site (from skin exam): Right Nasal Sidewall Pre-operative length (cm): 1.1 Pre-operative width (cm): 0.8 Indications for Mohs surgery: anatomic location where tissue conservation is critical Previously treated? No    Micrographic Surgery Details: Post-operative length (cm): 1.5 Post-operative width (cm): 1.3 Number of Mohs stages: 1 Is this a complex case  (associate members only): No    Stage 1    Tumor features identified on Mohs section: basal carcinoma    Depth of defect after stage: subcutaneous fat    Perineural invasion: no perineural invasion  Patient tolerance of procedure: tolerated well, no immediate complications  Reconstruction: Was the defect reconstructed? Yes   Was reconstruction performed by the same Mohs surgeon? Yes   Setting of reconstruction: outpatient office When was reconstruction performed? same day Type of reconstruction: linear Linear reconstruction: complex Length of linear repair (cm): 5  Opioids: Did the patient receive a prescription for opioid/narcotic related to Mohs surgery? Yes   Indications for opioid/narcotics: patient required additional pain relief despite trial of non-opioid analgesia  Antibiotics: Does patient meet AHA guidelines for endocarditis?: No   Does patient meet AHA guidelines for orthopedic prophylaxis?: No   Were antibiotics given on the day of surgery?: No   Did surgery breach mucosa, expose cartilage/bone, involve an area of lymphedema/inflamed/infected tissue? No    Skin repair Complexity:  Complex Final length (cm):  5 Informed consent: discussed and consent obtained   Timeout: patient name, date of birth, surgical site, and procedure verified   Procedure prep:  Patient was prepped and draped in usual sterile fashion Prep type:  Chlorhexidine Anesthesia: the lesion was anesthetized in a standard fashion   Anesthetic:  1% lidocaine w/ epinephrine 1-100,000 buffered w/ 8.4% NaHCO3 Reason for type of repair: reduce tension to allow closure and preserve normal anatomy   Undermining: edges undermined   Subcutaneous layers (deep stitches):  Suture size:  4-0 Suture type: Vicryl (polyglactin 910)   Stitches:  Buried vertical mattress Fine/surface layer approximation (top stitches):  Suture size:  6-0 Suture type: fast-absorbing plain gut  Stitches: simple running   Suture  removal (days):  0 Hemostasis achieved with: suture, pressure and electrodesiccation Outcome: patient tolerated procedure well with no complications   Post-procedure details: sterile dressing applied and wound care instructions given   Dressing type: bandage, petrolatum and pressure dressing    oxyCODONE (OXY IR/ROXICODONE) 5 MG immediate release tablet Take 1 tablet (5 mg total) by mouth every 6 (six) hours as needed for up to 8 doses for severe pain (pain score 7-10).   Return in about 4 weeks (around 01/19/2024).  Tonnie Frederick, Surg Tech III, am acting as scribe for Deneise Finlay, MD.   12/22/2023  HISTORY OF PRESENT ILLNESS  Austin Robbins is seen in consultation at the request of Dr. Fain Home for biopsy-proven Nodular Basal Cell Carcinoma on the right nasal sidewall. They note that the area has been present for about 12 months increasing in size with time.  There is no history of previous treatment.  Reports no other new or changing lesions and has no other complaints today.  Medications and allergies: see patient chart.  Review of systems: Reviewed 8 systems and notable for the above skin cancer.  All other systems reviewed are unremarkable/negative, unless noted in the HPI. Past medical history, surgical history, family history, social history were also reviewed and are noted in the chart/questionnaire.    PHYSICAL EXAMINATION  General: Well-appearing, in no acute distress, alert and oriented x 4. Vitals reviewed in chart (if available).   Skin: Exam reveals a 1.1 x 0.8 cm erythematous papule and biopsy scar on the right nasal sidewall. There are rhytids, telangiectasias, and lentigines, consistent with photodamage.  Biopsy report(s) reviewed, confirming the diagnosis.   ASSESSMENT  1) Nodular Basal Cell Carcinoma on the right nasal sidewall  2) photodamage 3) solar lentigines   PLAN   1. Due to location, size, histology, or recurrence and the likelihood of subclinical  extension as well as the need to conserve normal surrounding tissue, the patient was deemed acceptable for Mohs micrographic surgery (MMS).  The nature and purpose of the procedure, associated benefits and risks including recurrence and scarring, possible complications such as pain, infection, and bleeding, and alternative methods of treatment if appropriate were discussed with the patient during consent. The lesion location was verified by the patient, by reviewing previous notes, pathology reports, and by photographs as well as angulation measurements if available.  Informed consent was reviewed and signed by the patient, and timeout was performed at 9:00 AM. See op note below.  2. For the photodamage and solar lentigines, sun protection discussed/information given on OTC sunscreens, and we recommend continued regular follow-up with primary dermatologist every 6 months or sooner for any growing, bleeding, or changing lesions. 3. Prognosis and future surveillance discussed. 4. Letter with treatment outcome sent to referring provider. 5. Pain acetaminophen/ibuprofen/oxycodone 5 mg   MOHS MICROGRAPHIC SURGERY AND RECONSTRUCTION  Initial size:   1.1 x 0.8 cm Surgical defect/wound size: 1.5 x 1.3 cm Anesthesia:    0.33% lidocaine with 1:200,000 epinephrine EBL:    <5 mL Complications:  None Repair type:   Complex SQ suture:   4-0 Vicryl Cutaneous suture:  6-0 Plain gut Final size of the repair: 5.0 cm  Stages: 1  STAGE I: Anesthesia achieved with 0.5% lidocaine with 1:200,000 epinephrine. ChloraPrep applied. 2 section(s) excised using Mohs technique (this includes total peripheral and deep tissue margin excision and evaluation with frozen sections, excised and interpreted by the same physician). The tumor was  first debulked and then excised with an approx. 2mm margin.  Hemostasis was achieved with electrocautery as needed.  The specimen was then oriented, subdivided/relaxed, inked, and processed  using Mohs technique.    Frozen section analysis revealed a clear deep and peripheral margin.  Reconstruction  The surgical wound was then cleaned, prepped, and re-anesthetized as above. Wound edges were undermined extensively along at least one entire edge and at a distance equal to or greater than the width of the defect (see wound defect size above) in order to achieve closure and decrease wound tension and anatomic distortion. Redundant tissue repair including standing cone removal was performed. Hemostasis was achieved with electrocautery. Subcutaneous and epidermal tissues were approximated with the above sutures. The surgical site was then lightly scrubbed with sterile, saline-soaked gauze. The area was then bandaged using Vaseline ointment, non-adherent gauze, gauze pads, and tape to provide an adequate pressure dressing. The patient tolerated the procedure well, was given detailed written and verbal wound care instructions, and was discharged in good condition.   The patient will follow-up: 4 weeks.   Documentation: I have reviewed the above documentation for accuracy and completeness, and I agree with the above.  Deneise Finlay, MD

## 2023-12-22 NOTE — Patient Instructions (Signed)

## 2023-12-30 ENCOUNTER — Encounter: Payer: Self-pay | Admitting: Dermatology

## 2024-01-20 ENCOUNTER — Encounter: Payer: Self-pay | Admitting: Dermatology

## 2024-01-20 ENCOUNTER — Ambulatory Visit: Admitting: Dermatology

## 2024-01-20 DIAGNOSIS — L539 Erythematous condition, unspecified: Secondary | ICD-10-CM | POA: Diagnosis not present

## 2024-01-20 DIAGNOSIS — Z85828 Personal history of other malignant neoplasm of skin: Secondary | ICD-10-CM

## 2024-01-20 DIAGNOSIS — L905 Scar conditions and fibrosis of skin: Secondary | ICD-10-CM

## 2024-01-20 DIAGNOSIS — C44310 Basal cell carcinoma of skin of unspecified parts of face: Secondary | ICD-10-CM

## 2024-01-20 NOTE — Progress Notes (Signed)
   Follow Up Visit   Subjective  Austin Robbins is a 68 y.o. male who presents for the following: follow up from Mohs surgery  The patient presents for follow up from Mohs surgery for a BCC on the right nasal sidewall, treated on 12/22/2023, repaired with a linear closure. The patient has been bandaging the wound as directed. The endorse the following concerns: none  The following portions of the chart were reviewed this encounter and updated as appropriate: medications, allergies, medical history  Review of Systems:  No other skin or systemic complaints except as noted in HPI or Assessment and Plan.  Objective  Well appearing patient in no apparent distress; mood and affect are within normal limits.  A full examination was performed including face. All findings within normal limits unless otherwise noted below.  Healing wound with mild erythema  Relevant physical exam findings are noted in the Assessment and Plan.    Assessment & Plan   Healing s/p Mohs for Basal Cell Carcinoma on the right nasal sidewall, treated on 12/22/2023, repaired with an complex closure. - Reassured that wound is healing well - No evidence of infection - No swelling, induration, purulence, dehiscence, or tenderness out of proportion to the clinical exam, see photo above - Discussed that scars take up to 12 months to mature from the date of surgery - Recommend SPF 30+ to scar daily to prevent purple color from UV exposure during scar maturation process - Discussed that erythema and raised appearance of scar will fade over the next 4-6 months - OK to start scar massage at 4-6 weeks post-op - Can consider silicone based products for scar healing starting at 6 weeks post-op - Discontinue ointment daily to wound   HISTORY OF BASAL CELL CARCINOMA OF THE SKIN - No evidence of recurrence today - Recommend regular full body skin exams - Recommend daily broad spectrum sunscreen SPF 30+ to sun-exposed areas,  reapply every 2 hours as needed.  - Call if any new or changing lesions are noted between office visits  Return if symptoms worsen or fail to improve.  Maurine Sovereign, RN, am acting as scribe for Deneise Finlay, MD .   Documentation: I have reviewed the above documentation for accuracy and completeness, and I agree with the above.  Deneise Finlay, MD

## 2024-01-20 NOTE — Patient Instructions (Signed)

## 2024-01-21 ENCOUNTER — Other Ambulatory Visit: Payer: Self-pay | Admitting: Family Medicine

## 2024-01-21 DIAGNOSIS — C20 Malignant neoplasm of rectum: Secondary | ICD-10-CM

## 2024-01-21 DIAGNOSIS — I10 Essential (primary) hypertension: Secondary | ICD-10-CM

## 2024-01-21 DIAGNOSIS — E782 Mixed hyperlipidemia: Secondary | ICD-10-CM

## 2024-01-24 ENCOUNTER — Telehealth: Payer: Self-pay | Admitting: Family Medicine

## 2024-01-24 NOTE — Telephone Encounter (Unsigned)
 Copied from CRM [id number]. Topic: Clinical - Prescription Issue >> Jan 21, 2024  5:58 PM Tiffany H wrote: Reason for CRM: Called to follow up on [facility] request for supplies for patient. Fax was sent today. Please follow up.   [phone]

## 2024-01-25 ENCOUNTER — Encounter: Payer: Self-pay | Admitting: Family Medicine

## 2024-01-25 ENCOUNTER — Ambulatory Visit (INDEPENDENT_AMBULATORY_CARE_PROVIDER_SITE_OTHER): Payer: Medicare Other | Admitting: Family Medicine

## 2024-01-25 DIAGNOSIS — G4701 Insomnia due to medical condition: Secondary | ICD-10-CM | POA: Diagnosis not present

## 2024-01-25 DIAGNOSIS — F410 Panic disorder [episodic paroxysmal anxiety] without agoraphobia: Secondary | ICD-10-CM

## 2024-01-25 DIAGNOSIS — I10 Essential (primary) hypertension: Secondary | ICD-10-CM

## 2024-01-25 DIAGNOSIS — C20 Malignant neoplasm of rectum: Secondary | ICD-10-CM

## 2024-01-25 DIAGNOSIS — E782 Mixed hyperlipidemia: Secondary | ICD-10-CM | POA: Diagnosis not present

## 2024-01-25 LAB — CBC WITH DIFFERENTIAL/PLATELET
Basophils Absolute: 0 10*3/uL (ref 0.0–0.2)
Basos: 0 %
EOS (ABSOLUTE): 0.2 10*3/uL (ref 0.0–0.4)
Eos: 3 %
Hematocrit: 46.3 % (ref 37.5–51.0)
Hemoglobin: 15.5 g/dL (ref 13.0–17.7)
Immature Grans (Abs): 0 10*3/uL (ref 0.0–0.1)
Immature Granulocytes: 0 %
Lymphocytes Absolute: 1.6 10*3/uL (ref 0.7–3.1)
Lymphs: 22 %
MCH: 31.2 pg (ref 26.6–33.0)
MCHC: 33.5 g/dL (ref 31.5–35.7)
MCV: 93 fL (ref 79–97)
Monocytes Absolute: 0.6 10*3/uL (ref 0.1–0.9)
Monocytes: 7 %
Neutrophils Absolute: 5.1 10*3/uL (ref 1.4–7.0)
Neutrophils: 68 %
Platelets: 210 10*3/uL (ref 150–450)
RBC: 4.97 x10E6/uL (ref 4.14–5.80)
RDW: 12.1 % (ref 11.6–15.4)
WBC: 7.6 10*3/uL (ref 3.4–10.8)

## 2024-01-25 LAB — LIPID PANEL
Chol/HDL Ratio: 3.7 ratio (ref 0.0–5.0)
Cholesterol, Total: 116 mg/dL (ref 100–199)
HDL: 31 mg/dL — ABNORMAL LOW (ref >39)
LDL Chol Calc (NIH): 53 mg/dL (ref 0–99)
Triglycerides: 191 mg/dL — ABNORMAL HIGH (ref 0–149)
VLDL Cholesterol Cal: 32 mg/dL (ref 5–40)

## 2024-01-25 LAB — CMP14+EGFR
ALT: 22 IU/L (ref 0–44)
AST: 20 IU/L (ref 0–40)
Albumin: 4.2 g/dL (ref 3.9–4.9)
Alkaline Phosphatase: 63 IU/L (ref 44–121)
BUN/Creatinine Ratio: 14 (ref 10–24)
BUN: 13 mg/dL (ref 8–27)
Bilirubin Total: 0.6 mg/dL (ref 0.0–1.2)
CO2: 20 mmol/L (ref 20–29)
Calcium: 9.1 mg/dL (ref 8.6–10.2)
Chloride: 106 mmol/L (ref 96–106)
Creatinine, Ser: 0.95 mg/dL (ref 0.76–1.27)
Globulin, Total: 2.2 g/dL (ref 1.5–4.5)
Glucose: 131 mg/dL — ABNORMAL HIGH (ref 70–99)
Potassium: 3.9 mmol/L (ref 3.5–5.2)
Sodium: 144 mmol/L (ref 134–144)
Total Protein: 6.4 g/dL (ref 6.0–8.5)
eGFR: 88 mL/min/{1.73_m2} (ref >59)

## 2024-01-25 MED ORDER — ALPRAZOLAM 1 MG PO TABS: ORAL_TABLET | ORAL | 5 refills | Status: DC

## 2024-01-25 MED ORDER — FENOFIBRATE 160 MG PO TABS: 160.0000 mg | ORAL_TABLET | Freq: Every day | ORAL | 2 refills | Status: AC

## 2024-01-25 MED ORDER — AMLODIPINE BESYLATE 5 MG PO TABS: 5.0000 mg | ORAL_TABLET | Freq: Every day | ORAL | 2 refills | Status: AC

## 2024-01-25 MED ORDER — ATORVASTATIN CALCIUM 40 MG PO TABS: 40.0000 mg | ORAL_TABLET | Freq: Every day | ORAL | 2 refills | Status: AC

## 2024-01-25 NOTE — Telephone Encounter (Signed)
 Please advise.   Copied from CRM [id number]. Topic: General - Other >> Jan 25, 2024 11:59 AM [employer] wrote: Reason for CRM: Cleave Curling from [place] called to follow up on the documents sent on 01/21/24. They were resent on 01/25/24.  Callback #: [id number]

## 2024-01-25 NOTE — Progress Notes (Signed)
 Subjective:  Patient ID: Austin Robbins, male    DOB: [birth year]  Age: 68 y.o. MRN: [id number]  CC: Medical Management of Chronic Issues (No concerns )   HPI JOWELL BOSSI presents for  presents for  follow-up of hypertension. Patient has no history of headache chest pain or shortness of breath or recent cough. Patient also denies symptoms of TIA such as focal numbness or weakness. Patient denies side effects from medication. States taking it regularly.  Patient also states that he has been declared cancer free regarding his rectal adenocarcinoma.  However his ostomies remain permanent.  He continues to use Xanax  both for sleep and for panic.  Due for refill of that today.  Patient in for follow-up of elevated cholesterol. Doing well without complaints on current medication. Denies side effects of statin including myalgia and arthralgia and nausea. Also in today for liver function testing. Currently no chest pain, shortness of breath or other cardiovascular related symptoms noted.  Medications include atorvastatin  and fenofibrate      01/25/2024    9:07 AM 07/26/2023    3:22 PM 03/08/2023    8:06 AM 09/09/2022    8:17 AM  GAD 7 : Generalized Anxiety Score  Nervous, Anxious, on Edge 0 1 1 0  Control/stop worrying 1 1 2 1   Worry too much - different things 1 1 1 1   Trouble relaxing 0 1 0 0  Restless 0 0 0 0  Easily annoyed or irritable 0 0 0 0  Afraid - awful might happen 1 0 0 0  Total GAD 7 Score 3 4 4 2   Anxiety Difficulty Not difficult at all Somewhat difficult Somewhat difficult Not difficult at all         01/25/2024    9:07 AM 07/26/2023    3:22 PM 07/26/2023    2:48 PM  Depression screen PHQ 2/9  Decreased Interest 1 1 0  Down, Depressed, Hopeless 1 1 0  PHQ - 2 Score 2 2 0  Altered sleeping 1 2   Tired, decreased energy 1 2   Change in appetite 1 1   Feeling bad or failure about yourself  0 1   Trouble concentrating 0 0   Moving slowly or fidgety/restless 0 0    Suicidal thoughts 0 0   PHQ-9 Score 5 8   Difficult doing work/chores Not difficult at all Somewhat difficult     History Uzziah has a past medical history of Adenocarcinoma of rectum (HCC) (10/25/2013), Allergy, Anemia, Basal cell carcinoma, Colon cancer (HCC) (11/03/2013), Gastritis, H. pylori infection (08/07/2013), Hyperlipidemia, Hypertension (05/13/2016), Kidney stone, Panic attacks, Personal history of colonic polyps - adenoma (10/25/2013), and S/P colostomy (HCC).   He has a past surgical history that includes Tonsillectomy; Low anterior bowel resection (11/04/2013); Cystectomy w/ ureterosigmoidostomy (11/04/2013); Bowel resection (N/A, 11/03/2013); Application if wound vac (N/A, 11/03/2013); Cystoscopy with ureteroscopy and stent placement (Bilateral, 11/03/2013); Portacath placement (Left, 12/18/2013); Port-a-cath removal (N/A, 10/07/2017); Colonoscopy (10/25/2013); and Colonoscopy (2021).   His family history includes COPD in his mother; Healthy in his mother; Lung cancer (age of onset: 58) in his father; Lung cancer (age of onset: 47) in his mother.He reports that he has never smoked. He has never used smokeless tobacco. He reports current alcohol use of about 12.0 standard drinks of alcohol per week. He reports that he does not use drugs.    ROS Review of Systems  Constitutional:  Negative for fever.  Respiratory:  Negative for shortness of breath.  Cardiovascular:  Negative for chest pain.  Musculoskeletal:  Negative for arthralgias.  Skin:  Negative for rash.    Objective:  BP 124/73   Pulse 84   Temp 98.1 F (36.7 C)   Ht 6\' 1"  (1.854 m)   Wt 240 lb (108.9 kg)   SpO2 98%   BMI 31.66 kg/m   BP Readings from Last 3 Encounters:  01/25/24 124/73  12/22/23 124/68  11/29/23 137/71    Wt Readings from Last 3 Encounters:  01/25/24 240 lb (108.9 kg)  11/01/23 240 lb 1.6 oz (108.9 kg)  07/26/23 240 lb (108.9 kg)     Physical Exam Vitals reviewed.   Constitutional:      Appearance: He is well-developed.  HENT:     Head: Normocephalic and atraumatic.     Right Ear: External ear normal.     Left Ear: External ear normal.     Mouth/Throat:     Pharynx: No oropharyngeal exudate or posterior oropharyngeal erythema.  Eyes:     Pupils: Pupils are equal, round, and reactive to light.  Cardiovascular:     Rate and Rhythm: Normal rate and regular rhythm.     Heart sounds: No murmur heard. Pulmonary:     Effort: No respiratory distress.     Breath sounds: Normal breath sounds.  Musculoskeletal:     Cervical back: Normal range of motion and neck supple.  Neurological:     Mental Status: He is alert and oriented to person, place, and time.      Assessment & Plan:  Rectal adenocarcinoma with perforation & invasion into bladder s/p LAR/cystectomy/colon conduit 11/04/2013 -     amLODIPine  Besylate; Take 1 tablet (5 mg total) by mouth daily.  Dispense: 90 tablet; Refill: 2  Essential hypertension -     amLODIPine  Besylate; Take 1 tablet (5 mg total) by mouth daily.  Dispense: 90 tablet; Refill: 2 -     CBC with Differential/Platelet -     CMP14+EGFR -     Lipid panel  Mixed hyperlipidemia -     Atorvastatin  Calcium ; Take 1 tablet (40 mg total) by mouth daily.  Dispense: 90 tablet; Refill: 2 -     CBC with Differential/Platelet -     CMP14+EGFR -     Lipid panel  Panic attacks -     ALPRAZolam ; TAKE 1/2 TO 1 TABLET BY MOUTH AS NEEDED FOR SLEEP  Dispense: 30 tablet; Refill: 5  Insomnia due to medical condition -     ALPRAZolam ; TAKE 1/2 TO 1 TABLET BY MOUTH AS NEEDED FOR SLEEP  Dispense: 30 tablet; Refill: 5  Other orders -     Fenofibrate ; Take 1 tablet (160 mg total) by mouth daily. For cholesterol and triglyceride  Dispense: 90 tablet; Refill: 2     Follow-up: Return in about 6 months (around 07/27/2024) for [facility] physical.  Roise Cleaver, M.D.

## 2024-01-26 NOTE — Telephone Encounter (Signed)
 No forms have been received as of yet, they will refax them to fax # [phone]

## 2024-01-26 NOTE — Telephone Encounter (Signed)
Paperwork received; placed on PCP's desk.

## 2024-01-27 ENCOUNTER — Ambulatory Visit: Payer: Self-pay | Admitting: Family Medicine

## 2024-02-02 NOTE — Telephone Encounter (Signed)
 Order faxed to [facility] at [phone]

## 2024-02-09 ENCOUNTER — Encounter: Payer: Self-pay | Admitting: Dermatology

## 2024-03-23 ENCOUNTER — Other Ambulatory Visit: Payer: Self-pay | Admitting: Family Medicine

## 2024-03-23 DIAGNOSIS — C20 Malignant neoplasm of rectum: Secondary | ICD-10-CM

## 2024-07-20 ENCOUNTER — Other Ambulatory Visit: Payer: Self-pay | Admitting: *Deleted

## 2024-07-20 DIAGNOSIS — F410 Panic disorder [episodic paroxysmal anxiety] without agoraphobia: Secondary | ICD-10-CM

## 2024-07-20 DIAGNOSIS — G4701 Insomnia due to medical condition: Secondary | ICD-10-CM

## 2024-07-27 ENCOUNTER — Encounter: Payer: Self-pay | Admitting: Family Medicine

## 2024-07-27 ENCOUNTER — Ambulatory Visit (INDEPENDENT_AMBULATORY_CARE_PROVIDER_SITE_OTHER): Payer: Self-pay | Admitting: Family Medicine

## 2024-07-27 VITALS — BP 122/69 | HR 58 | Temp 97.9°F | Ht 73.0 in | Wt 239.0 lb

## 2024-07-27 DIAGNOSIS — Z79899 Other long term (current) drug therapy: Secondary | ICD-10-CM

## 2024-07-27 DIAGNOSIS — I10 Essential (primary) hypertension: Secondary | ICD-10-CM | POA: Diagnosis not present

## 2024-07-27 DIAGNOSIS — Z0000 Encounter for general adult medical examination without abnormal findings: Secondary | ICD-10-CM

## 2024-07-27 DIAGNOSIS — E782 Mixed hyperlipidemia: Secondary | ICD-10-CM

## 2024-07-27 DIAGNOSIS — N401 Enlarged prostate with lower urinary tract symptoms: Secondary | ICD-10-CM

## 2024-07-27 DIAGNOSIS — C20 Malignant neoplasm of rectum: Secondary | ICD-10-CM

## 2024-07-27 DIAGNOSIS — R351 Nocturia: Secondary | ICD-10-CM

## 2024-07-27 DIAGNOSIS — Z933 Colostomy status: Secondary | ICD-10-CM

## 2024-07-27 DIAGNOSIS — Z23 Encounter for immunization: Secondary | ICD-10-CM | POA: Diagnosis not present

## 2024-07-27 DIAGNOSIS — Z932 Ileostomy status: Secondary | ICD-10-CM

## 2024-07-27 DIAGNOSIS — M1A0790 Idiopathic chronic gout, unspecified ankle and foot, without tophus (tophi): Secondary | ICD-10-CM

## 2024-07-27 DIAGNOSIS — G4701 Insomnia due to medical condition: Secondary | ICD-10-CM | POA: Diagnosis not present

## 2024-07-27 DIAGNOSIS — E559 Vitamin D deficiency, unspecified: Secondary | ICD-10-CM

## 2024-07-27 DIAGNOSIS — Z0001 Encounter for general adult medical examination with abnormal findings: Secondary | ICD-10-CM | POA: Diagnosis not present

## 2024-07-27 DIAGNOSIS — F410 Panic disorder [episodic paroxysmal anxiety] without agoraphobia: Secondary | ICD-10-CM

## 2024-07-27 LAB — URINALYSIS, ROUTINE W REFLEX MICROSCOPIC
Bilirubin, UA: NEGATIVE
Glucose, UA: NEGATIVE
Ketones, UA: NEGATIVE
Nitrite, UA: POSITIVE — AB
Protein,UA: NEGATIVE
RBC, UA: NEGATIVE
Specific Gravity, UA: 1.015 (ref 1.005–1.030)
Urobilinogen, Ur: 0.2 mg/dL (ref 0.2–1.0)
pH, UA: 7 (ref 5.0–7.5)

## 2024-07-27 LAB — MICROSCOPIC EXAMINATION
RBC, Urine: NONE SEEN /HPF (ref 0–2)
Yeast, UA: NONE SEEN

## 2024-07-27 MED ORDER — INDOMETHACIN 50 MG PO CAPS
50.0000 mg | ORAL_CAPSULE | Freq: Three times a day (TID) | ORAL | 2 refills | Status: AC | PRN
Start: 2024-07-27 — End: ?

## 2024-07-27 MED ORDER — ALPRAZOLAM 1 MG PO TABS: ORAL_TABLET | ORAL | 5 refills | Status: AC

## 2024-07-27 NOTE — Progress Notes (Signed)
 Subjective:  Patient ID: Austin Robbins, male    DOB: Nov 13, 1955  Age: 68 y.o. MRN: [id number]  CC: Annual Exam (No concerns )   HPI  Discussed the use of AI scribe software for clinical note transcription with the patient, who gave verbal consent to proceed.  History of Present Illness Austin Robbins is a 67 year old male who presents for Annual Physical Exam  He has no new symptoms since his last visit a few months ago. He takes fenofibrate  for cholesterol management without adverse effects such as muscle aches and indomethacin  for gout, which he uses occasionally. He also takes vitamin D  supplements.  He uses Xanax  primarily for sleep and occasionally experiences anxiety during the day, particularly when thinking about his wife. He denies feeling depressed, describing it as 'just missing her.'  He experiences a sensation of needing to urinate despite having no bladder. He changes his urinary bag every other day and prefers to remove it during showers to wash his abdomen.  He denies any significant hearing issues but notes that he sometimes needs to adjust the TV volume, attributing it to changes in TV volume rather than hearing loss.  He denies shortness of breath or chest pain. He experiences a mild wheeze and coughs up phlegm, which he attributes to a recent head cold. He uses over-the-counter decongestants and Mucinex for relief.  He has a history of a hydrocele, which he has opted not to treat as it does not cause significant discomfort.          07/27/2024    9:04 AM 01/25/2024    9:07 AM 07/26/2023    3:22 PM  Depression screen PHQ 2/9  Decreased Interest 1 1 1   Down, Depressed, Hopeless 0 1 1  PHQ - 2 Score 1 2 2   Altered sleeping 2 1 2   Tired, decreased energy 1 1 2   Change in appetite 0 1 1  Feeling bad or failure about yourself  1 0 1  Trouble concentrating 0 0 0  Moving slowly or fidgety/restless 0 0 0  Suicidal thoughts 0 0 0  PHQ-9 Score 5 5  8     Difficult doing work/chores Not difficult at all Not difficult at all Somewhat difficult     Data saved with a previous flowsheet row definition    History Austin Robbins has a past medical history of Adenocarcinoma of rectum (HCC) (10/25/2013), Allergy, Anemia, Basal cell carcinoma, Colon cancer (HCC) (11/03/2013), Gastritis, H. pylori infection (08/07/2013), Hyperlipidemia, Hypertension (05/13/2016), Kidney stone, Panic attacks, Personal history of colonic polyps - adenoma (10/25/2013), and S/P colostomy (HCC).   He has a past surgical history that includes Tonsillectomy; Low anterior bowel resection (11/04/2013); Cystectomy w/ ureterosigmoidostomy (11/04/2013); Bowel resection (N/A, 11/03/2013); Application if wound vac (N/A, 11/03/2013); Cystoscopy with ureteroscopy and stent placement (Bilateral, 11/03/2013); Portacath placement (Left, 12/18/2013); Port-a-cath removal (N/A, 10/07/2017); Colonoscopy (10/25/2013); and Colonoscopy (2021).   His family history includes COPD in his mother; Healthy in his mother; Lung cancer (age of onset: 44) in his father; Lung cancer (age of onset: 57) in his mother.He reports that he has never smoked. He has never used smokeless tobacco. He reports current alcohol use of about 12.0 standard drinks of alcohol per week. He reports that he does not use drugs.    ROS Review of Systems  Constitutional:  Negative for activity change, fatigue and unexpected weight change.  HENT:  Negative for congestion, ear pain, hearing loss, postnasal drip and trouble swallowing.  Eyes:  Negative for pain and visual disturbance.  Respiratory:  Negative for cough, chest tightness and shortness of breath.   Cardiovascular:  Negative for chest pain, palpitations and leg swelling.  Gastrointestinal:  Negative for abdominal distention, abdominal pain, blood in stool, constipation, diarrhea, nausea and vomiting.       Iliostomy present RLQ, colostomy, LLQ  Endocrine: Negative for cold  intolerance, heat intolerance and polydipsia.  Genitourinary:  Negative for difficulty urinating, dysuria, flank pain, frequency and urgency.  Musculoskeletal:  Negative for arthralgias and joint swelling.  Skin:  Negative for color change, rash and wound.  Neurological:  Negative for dizziness, syncope, speech difficulty, weakness, light-headedness, numbness and headaches.  Hematological:  Does not bruise/bleed easily.  Psychiatric/Behavioral:  Negative for confusion, decreased concentration, dysphoric mood and sleep disturbance. The patient is not nervous/anxious.     Objective:  BP 122/69   Pulse (!) 58   Temp 97.9 F (36.6 C)   Ht 6' 1 (1.854 m)   Wt 239 lb (108.4 kg)   SpO2 97%   BMI 31.53 kg/m   BP Readings from Last 3 Encounters:  07/27/24 122/69  01/25/24 124/73  12/22/23 124/68    Wt Readings from Last 3 Encounters:  07/27/24 239 lb (108.4 kg)  01/25/24 240 lb (108.9 kg)  11/01/23 240 lb 1.6 oz (108.9 kg)     Physical Exam Constitutional:      Appearance: He is well-developed.  HENT:     Head: Normocephalic and atraumatic.  Eyes:     Pupils: Pupils are equal, round, and reactive to light.  Neck:     Thyroid: No thyromegaly.     Trachea: No tracheal deviation.  Cardiovascular:     Rate and Rhythm: Normal rate and regular rhythm.     Heart sounds: Normal heart sounds. No murmur heard.    No friction rub. No gallop.  Pulmonary:     Breath sounds: Normal breath sounds. No wheezing or rales.  Abdominal:     General: Bowel sounds are normal. There is no distension.     Palpations: Abdomen is soft. There is no mass.     Tenderness: There is no abdominal tenderness.     Hernia: There is no hernia in the left inguinal area or right inguinal area.  Genitourinary:    Penis: Normal.      Testes:        Right: Testicular hydrocele present. Tenderness or varicocele not present.        Left: Tenderness, testicular hydrocele or varicocele not present.   Musculoskeletal:        General: Normal range of motion.     Cervical back: Normal range of motion.  Lymphadenopathy:     Cervical: No cervical adenopathy.  Skin:    General: Skin is warm and dry.  Neurological:     Mental Status: He is alert and oriented to person, place, and time.    Physical Exam  EKG:  No ischemic changes. Right axis deviation noted  Assessment & Plan:  Well adult exam -     CBC with Differential/Platelet -     EKG 12-Lead  Insomnia due to medical condition -     ALPRAZolam ; TAKE 1/2 TO 1 TABLET BY MOUTH AS NEEDED FOR SLEEP  Dispense: 30 tablet; Refill: 5 -     CBC with Differential/Platelet  Controlled substance agreement signed -     ToxASSURE Select 13 (MW), Urine  Immunization due -     Pneumococcal  conjugate vaccine 20-valent  BPH associated with nocturia -     Urinalysis, Routine w reflex microscopic -     PSA, total and free -     Urinalysis  Vitamin D  deficiency -     VITAMIN D  25 Hydroxy (Vit-D Deficiency, Fractures)  Primary hypertension -     CBC with Differential/Platelet -     CMP14+EGFR -     EKG 12-Lead  Mixed hyperlipidemia -     CBC with Differential/Platelet -     CMP14+EGFR -     Lipid panel  Ileostomy status (HCC) -     Urine Culture  Colostomy status (HCC)  Idiopathic chronic gout 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  Dispense: 45 capsule; Refill: 2  Encounter for immunization -     Flu vaccine HIGH DOSE PF(Fluzone Trivalent)    Assessment and Plan Assessment & Plan Adult Wellness Visit   Routine wellness visit with no new symptoms. Blood pressure is well-controlled at 122/69 mmHg. Weight is elevated with abdominal fat. No significant hearing issues. Mild ankle swelling observed. No chest pain or palpitations. Mild wheeze likely due to recent upper respiratory infection. Continue current medications:  Amlodipine  besylate, Atorvastatin  calcium , Alprazolam , Fenofibrate . Encourage weight management and exercise to reduce abdominal fat. Performed EKG to assess for any cardiac issues related to ankle swelling. Continue over-the-counter decongestants for upper respiratory symptoms.  Essential hypertension   Blood pressure is well-controlled at 122/69 mmHg with current medication regimen. Continue Amlodipine  besylate 5 mg oral daily.  Mixed hyperlipidemia   Continues on Atorvastatin  and Fenofibrate  without reported side effects such as muscle aches. Continue Atorvastatin  calcium  40 mg oral daily. Continue Fenofibrate  160 mg oral daily.  Idiopathic chronic gout, unspecified ankle and foot, without tophus   Occasional use of Indomethacin  for gout flares, not frequent. Continue Indomethacin  as needed for gout flares.  Insomnia and panic attacks managed with benzodiazepine   Using Alprazolam  primarily for sleep. No significant daytime anxiety or depression reported. Continue Alprazolam  oral as needed for sleep.  Benign prostatic hyperplasia with lower urinary tract symptoms   Experiences sensation of needing to urinate despite no bladder. Symptoms may be related to phantom pain or psychological factors. No significant pain reported. Consider discussing symptoms with urologist if he persists or worsens.  Hydrocele, right testicle   Right testicle hydrocele present for many years without significant pain or discomfort. No indication for surgical intervention unless symptoms worsen. Continue to monitor for any changes in size or symptoms.  Colostomy and ileostomy status   No new issues reported. Manages colostomy and ileostomy bags regularly. Continue current management of colostomy and ileostomy bags.  Vitamin D  deficiency   No specific discussion of current vitamin D  levels or supplementation. Continue vitamin D  supplementation as previously prescribed.  Localized edema   Mild ankle swelling noted.  No significant shortness of breath or chest pain. EKG included to rule out cardiac causes. Performed EKG to assess for cardiac causes of edema.       Follow-up: No follow-ups on file.  Butler Der, M.D.

## 2024-07-28 LAB — CMP14+EGFR
ALT: 21 IU/L (ref 0–44)
AST: 24 IU/L (ref 0–40)
Albumin: 4.2 g/dL (ref 3.9–4.9)
Alkaline Phosphatase: 59 IU/L (ref 47–123)
BUN/Creatinine Ratio: 16 (ref 10–24)
BUN: 14 mg/dL (ref 8–27)
Bilirubin Total: 0.7 mg/dL (ref 0.0–1.2)
CO2: 21 mmol/L (ref 20–29)
Calcium: 9.3 mg/dL (ref 8.6–10.2)
Chloride: 103 mmol/L (ref 96–106)
Creatinine, Ser: 0.85 mg/dL (ref 0.76–1.27)
Globulin, Total: 2.3 g/dL (ref 1.5–4.5)
Glucose: 96 mg/dL (ref 70–99)
Potassium: 4.2 mmol/L (ref 3.5–5.2)
Sodium: 139 mmol/L (ref 134–144)
Total Protein: 6.5 g/dL (ref 6.0–8.5)
eGFR: 95 mL/min/1.73 (ref >59)

## 2024-07-28 LAB — CBC WITH DIFFERENTIAL/PLATELET
Basophils Absolute: 0 x10E3/uL (ref 0.0–0.2)
Basos: 0 %
EOS (ABSOLUTE): 0.3 x10E3/uL (ref 0.0–0.4)
Eos: 5 %
Hematocrit: 47.5 % (ref 37.5–51.0)
Hemoglobin: 15.9 g/dL (ref 13.0–17.7)
Immature Grans (Abs): 0 x10E3/uL (ref 0.0–0.1)
Immature Granulocytes: 0 %
Lymphocytes Absolute: 2.1 x10E3/uL (ref 0.7–3.1)
Lymphs: 32 %
MCH: 31.7 pg (ref 26.6–33.0)
MCHC: 33.5 g/dL (ref 31.5–35.7)
MCV: 95 fL (ref 79–97)
Monocytes Absolute: 0.6 x10E3/uL (ref 0.1–0.9)
Monocytes: 9 %
Neutrophils Absolute: 3.7 x10E3/uL (ref 1.4–7.0)
Neutrophils: 54 %
Platelets: 227 x10E3/uL (ref 150–450)
RBC: 5.01 x10E6/uL (ref 4.14–5.80)
RDW: 12.3 % (ref 11.6–15.4)
WBC: 6.8 x10E3/uL (ref 3.4–10.8)

## 2024-07-28 LAB — LIPID PANEL
Chol/HDL Ratio: 3.6 ratio (ref 0.0–5.0)
Cholesterol, Total: 125 mg/dL (ref 100–199)
HDL: 35 mg/dL — ABNORMAL LOW (ref >39)
LDL Chol Calc (NIH): 69 mg/dL (ref 0–99)
Triglycerides: 114 mg/dL (ref 0–149)
VLDL Cholesterol Cal: 21 mg/dL (ref 5–40)

## 2024-07-28 LAB — PSA, TOTAL AND FREE
PSA, Free: <0.02 ng/mL
Prostate Specific Ag, Serum: <0.1 ng/mL (ref 0.0–4.0)

## 2024-07-28 LAB — VITAMIN D 25 HYDROXY (VIT D DEFICIENCY, FRACTURES): Vit D, 25-Hydroxy: 25 ng/mL — ABNORMAL LOW (ref 30.0–100.0)

## 2024-07-30 ENCOUNTER — Ambulatory Visit: Payer: Self-pay | Admitting: Family Medicine

## 2024-07-30 NOTE — Progress Notes (Signed)
Dear Austin Robbins, Your Vitamin D is  low. You need a prescription strength supplement I will send that in for you. Nurse, if at all possible, could you send in a prescription for the patient for vitamin D 50,000 units, 1 p.o. weekly #13 with 3 refills? Many thanks, WS

## 2024-07-30 NOTE — Progress Notes (Signed)
Dear Yasseen P Seth, Your Vitamin D is  low. You need a prescription strength supplement I will send that in for you. Nurse, if at all possible, could you send in a prescription for the patient for vitamin D 50,000 units, 1 p.o. weekly #13 with 3 refills? Many thanks, WS

## 2024-07-31 ENCOUNTER — Other Ambulatory Visit: Payer: Self-pay

## 2024-07-31 MED ORDER — VITAMIN D (ERGOCALCIFEROL) 1.25 MG (50000 UNIT) PO CAPS: 50000.0000 [IU] | ORAL_CAPSULE | ORAL | 3 refills | Status: AC

## 2024-08-01 LAB — TOXASSURE SELECT 13 (MW), URINE

## 2024-09-14 ENCOUNTER — Ambulatory Visit

## 2025-01-24 ENCOUNTER — Ambulatory Visit: Admitting: Family Medicine

## 2025-07-11 ENCOUNTER — Ambulatory Visit
